# Patient Record
Sex: Female | Born: 1940
Health system: Southern US, Community
[De-identification: ages and names within clinical notes are randomized; demographics above are authoritative.]

## PROBLEM LIST (undated history)

## (undated) DIAGNOSIS — C189 Malignant neoplasm of colon, unspecified: Secondary | ICD-10-CM

## (undated) DIAGNOSIS — R918 Other nonspecific abnormal finding of lung field: Principal | ICD-10-CM

## (undated) DIAGNOSIS — K219 Gastro-esophageal reflux disease without esophagitis: Secondary | ICD-10-CM

## (undated) DIAGNOSIS — R011 Cardiac murmur, unspecified: Secondary | ICD-10-CM

## (undated) DIAGNOSIS — G709 Myoneural disorder, unspecified: Secondary | ICD-10-CM

## (undated) DIAGNOSIS — K5790 Diverticulosis of intestine, part unspecified, without perforation or abscess without bleeding: Secondary | ICD-10-CM

## (undated) DIAGNOSIS — M3500 Sicca syndrome, unspecified: Secondary | ICD-10-CM

## (undated) DIAGNOSIS — C8591 Non-Hodgkin lymphoma, unspecified, lymph nodes of head, face, and neck: Secondary | ICD-10-CM

## (undated) DIAGNOSIS — N200 Calculus of kidney: Secondary | ICD-10-CM

## (undated) DIAGNOSIS — M797 Fibromyalgia: Secondary | ICD-10-CM

## (undated) DIAGNOSIS — F32A Depression, unspecified: Secondary | ICD-10-CM

## (undated) DIAGNOSIS — E785 Hyperlipidemia, unspecified: Secondary | ICD-10-CM

## (undated) DIAGNOSIS — C801 Malignant (primary) neoplasm, unspecified: Secondary | ICD-10-CM

## (undated) DIAGNOSIS — D329 Benign neoplasm of meninges, unspecified: Secondary | ICD-10-CM

## (undated) DIAGNOSIS — I10 Essential (primary) hypertension: Secondary | ICD-10-CM

## (undated) DIAGNOSIS — M1990 Unspecified osteoarthritis, unspecified site: Secondary | ICD-10-CM

## (undated) DIAGNOSIS — T4145XA Adverse effect of unspecified anesthetic, initial encounter: Secondary | ICD-10-CM

## (undated) DIAGNOSIS — H269 Unspecified cataract: Secondary | ICD-10-CM

## (undated) DIAGNOSIS — F329 Major depressive disorder, single episode, unspecified: Secondary | ICD-10-CM

## (undated) DIAGNOSIS — J45909 Unspecified asthma, uncomplicated: Secondary | ICD-10-CM

## (undated) DIAGNOSIS — T8859XA Other complications of anesthesia, initial encounter: Secondary | ICD-10-CM

## (undated) DIAGNOSIS — F419 Anxiety disorder, unspecified: Secondary | ICD-10-CM

## (undated) HISTORY — DX: Sjogren syndrome, unspecified: M35.00

## (undated) HISTORY — DX: Hyperlipidemia, unspecified: E78.5

## (undated) HISTORY — DX: Anxiety disorder, unspecified: F41.9

## (undated) HISTORY — DX: Fibromyalgia: M79.7

## (undated) HISTORY — DX: Benign neoplasm of meninges, unspecified: D32.9

## (undated) HISTORY — DX: Diverticulosis of intestine, part unspecified, without perforation or abscess without bleeding: K57.90

## (undated) HISTORY — DX: Calculus of kidney: N20.0

## (undated) HISTORY — DX: Other nonspecific abnormal finding of lung field: R91.8

## (undated) HISTORY — DX: Malignant neoplasm of colon, unspecified: C18.9

## (undated) HISTORY — DX: Unspecified asthma, uncomplicated: J45.909

## (undated) HISTORY — DX: Unspecified cataract: H26.9

## (undated) HISTORY — PX: ABDOMINAL HYSTERECTOMY: SHX81

## (undated) HISTORY — DX: Essential (primary) hypertension: I10

## (undated) HISTORY — DX: Gastro-esophageal reflux disease without esophagitis: K21.9

## (undated) HISTORY — DX: Myoneural disorder, unspecified: G70.9

## (undated) HISTORY — DX: Non-Hodgkin lymphoma, unspecified, lymph nodes of head, face, and neck: C85.91

## (undated) HISTORY — DX: Malignant (primary) neoplasm, unspecified: C80.1

## (undated) HISTORY — DX: Depression, unspecified: F32.A

## (undated) HISTORY — DX: Major depressive disorder, single episode, unspecified: F32.9

## (undated) HISTORY — DX: Cardiac murmur, unspecified: R01.1

## (undated) HISTORY — PX: POLYPECTOMY: SHX149

## (undated) HISTORY — PX: TONSILLECTOMY: SUR1361

## (undated) HISTORY — PX: COLONOSCOPY: SHX174

## (undated) HISTORY — DX: Unspecified osteoarthritis, unspecified site: M19.90

---

## 1972-08-20 HISTORY — PX: KIDNEY STONE SURGERY: SHX686

## 1987-08-21 HISTORY — PX: VAGINAL HYSTERECTOMY: SHX2639

## 2005-12-19 ENCOUNTER — Ambulatory Visit (HOSPITAL_COMMUNITY): Admission: RE | Admit: 2005-12-19 | Discharge: 2005-12-19 | Payer: Self-pay | Admitting: Internal Medicine

## 2006-03-06 ENCOUNTER — Encounter: Admission: RE | Admit: 2006-03-06 | Discharge: 2006-03-06 | Payer: Self-pay | Admitting: Orthopedic Surgery

## 2006-04-25 ENCOUNTER — Encounter (INDEPENDENT_AMBULATORY_CARE_PROVIDER_SITE_OTHER): Payer: Self-pay | Admitting: Specialist

## 2006-04-25 ENCOUNTER — Ambulatory Visit (HOSPITAL_COMMUNITY): Admission: RE | Admit: 2006-04-25 | Discharge: 2006-04-25 | Payer: Self-pay | Admitting: Surgery

## 2006-08-20 HISTORY — PX: MASS EXCISION: SHX2000

## 2006-08-20 HISTORY — PX: APPENDECTOMY: SHX54

## 2006-10-30 ENCOUNTER — Ambulatory Visit: Payer: Self-pay | Admitting: Internal Medicine

## 2006-11-07 ENCOUNTER — Encounter: Admission: RE | Admit: 2006-11-07 | Discharge: 2006-11-07 | Payer: Self-pay | Admitting: Internal Medicine

## 2006-11-11 ENCOUNTER — Ambulatory Visit: Payer: Self-pay | Admitting: Internal Medicine

## 2006-12-02 ENCOUNTER — Ambulatory Visit: Payer: Self-pay

## 2006-12-02 ENCOUNTER — Encounter: Payer: Self-pay | Admitting: Internal Medicine

## 2006-12-10 ENCOUNTER — Ambulatory Visit: Payer: Self-pay | Admitting: Gastroenterology

## 2006-12-17 ENCOUNTER — Ambulatory Visit: Payer: Self-pay | Admitting: Internal Medicine

## 2006-12-23 ENCOUNTER — Ambulatory Visit: Payer: Self-pay | Admitting: Gastroenterology

## 2006-12-23 ENCOUNTER — Encounter: Payer: Self-pay | Admitting: Internal Medicine

## 2006-12-23 LAB — HM COLONOSCOPY

## 2007-01-15 ENCOUNTER — Encounter: Admission: RE | Admit: 2007-01-15 | Discharge: 2007-01-15 | Payer: Self-pay | Admitting: Internal Medicine

## 2007-03-24 ENCOUNTER — Ambulatory Visit: Payer: Self-pay | Admitting: Internal Medicine

## 2007-03-24 LAB — CONVERTED CEMR LAB
ALT: 27 units/L (ref 0–35)
AST: 25 units/L (ref 0–37)
Bilirubin, Direct: 0.1 mg/dL (ref 0.0–0.3)
Cholesterol: 172 mg/dL (ref 0–200)
Total Bilirubin: 0.6 mg/dL (ref 0.3–1.2)
Total Protein: 8 g/dL (ref 6.0–8.3)

## 2007-03-31 ENCOUNTER — Encounter: Admission: RE | Admit: 2007-03-31 | Discharge: 2007-03-31 | Payer: Self-pay | Admitting: Neurosurgery

## 2007-04-07 ENCOUNTER — Encounter: Payer: Self-pay | Admitting: Internal Medicine

## 2007-05-21 ENCOUNTER — Ambulatory Visit: Payer: Self-pay | Admitting: Internal Medicine

## 2007-06-13 ENCOUNTER — Telehealth: Payer: Self-pay | Admitting: Internal Medicine

## 2007-06-23 ENCOUNTER — Ambulatory Visit: Payer: Self-pay | Admitting: Internal Medicine

## 2007-07-21 DIAGNOSIS — M3500 Sicca syndrome, unspecified: Secondary | ICD-10-CM | POA: Insufficient documentation

## 2007-07-21 DIAGNOSIS — E782 Mixed hyperlipidemia: Secondary | ICD-10-CM | POA: Insufficient documentation

## 2007-07-21 DIAGNOSIS — E785 Hyperlipidemia, unspecified: Secondary | ICD-10-CM | POA: Insufficient documentation

## 2007-07-22 ENCOUNTER — Ambulatory Visit: Payer: Self-pay | Admitting: Internal Medicine

## 2007-07-22 DIAGNOSIS — M797 Fibromyalgia: Secondary | ICD-10-CM | POA: Insufficient documentation

## 2007-07-22 DIAGNOSIS — K5730 Diverticulosis of large intestine without perforation or abscess without bleeding: Secondary | ICD-10-CM | POA: Insufficient documentation

## 2007-07-22 DIAGNOSIS — M76899 Other specified enthesopathies of unspecified lower limb, excluding foot: Secondary | ICD-10-CM | POA: Insufficient documentation

## 2007-07-22 DIAGNOSIS — D320 Benign neoplasm of cerebral meninges: Secondary | ICD-10-CM | POA: Insufficient documentation

## 2007-07-22 DIAGNOSIS — IMO0001 Reserved for inherently not codable concepts without codable children: Secondary | ICD-10-CM | POA: Insufficient documentation

## 2007-07-26 LAB — CONVERTED CEMR LAB
Sed Rate: 68 mm/hr — ABNORMAL HIGH (ref 0–25)
Total CK: 78 units/L (ref 7–177)

## 2007-07-29 ENCOUNTER — Encounter (INDEPENDENT_AMBULATORY_CARE_PROVIDER_SITE_OTHER): Payer: Self-pay | Admitting: *Deleted

## 2007-08-07 ENCOUNTER — Encounter: Payer: Self-pay | Admitting: Internal Medicine

## 2007-08-08 ENCOUNTER — Encounter: Payer: Self-pay | Admitting: Internal Medicine

## 2007-09-02 ENCOUNTER — Ambulatory Visit: Payer: Self-pay | Admitting: Internal Medicine

## 2007-09-02 DIAGNOSIS — L259 Unspecified contact dermatitis, unspecified cause: Secondary | ICD-10-CM | POA: Insufficient documentation

## 2007-09-02 DIAGNOSIS — M654 Radial styloid tenosynovitis [de Quervain]: Secondary | ICD-10-CM | POA: Insufficient documentation

## 2007-09-02 LAB — CONVERTED CEMR LAB

## 2007-09-16 ENCOUNTER — Telehealth: Payer: Self-pay | Admitting: Internal Medicine

## 2007-09-19 ENCOUNTER — Encounter: Payer: Self-pay | Admitting: Internal Medicine

## 2007-10-08 ENCOUNTER — Encounter: Payer: Self-pay | Admitting: Internal Medicine

## 2007-10-30 ENCOUNTER — Telehealth: Payer: Self-pay | Admitting: Internal Medicine

## 2007-11-29 ENCOUNTER — Encounter: Admission: RE | Admit: 2007-11-29 | Discharge: 2007-11-29 | Payer: Self-pay | Admitting: Neurosurgery

## 2007-12-08 ENCOUNTER — Encounter: Payer: Self-pay | Admitting: Internal Medicine

## 2007-12-18 ENCOUNTER — Encounter: Payer: Self-pay | Admitting: Internal Medicine

## 2007-12-18 ENCOUNTER — Telehealth: Payer: Self-pay | Admitting: Internal Medicine

## 2007-12-19 ENCOUNTER — Encounter: Admission: RE | Admit: 2007-12-19 | Discharge: 2007-12-19 | Payer: Self-pay | Admitting: Internal Medicine

## 2008-01-13 ENCOUNTER — Telehealth: Payer: Self-pay | Admitting: Internal Medicine

## 2008-01-16 ENCOUNTER — Telehealth: Payer: Self-pay | Admitting: Internal Medicine

## 2008-02-12 ENCOUNTER — Telehealth: Payer: Self-pay | Admitting: Internal Medicine

## 2008-04-08 ENCOUNTER — Telehealth: Payer: Self-pay | Admitting: Internal Medicine

## 2008-08-10 ENCOUNTER — Encounter: Payer: Self-pay | Admitting: Internal Medicine

## 2008-09-01 ENCOUNTER — Ambulatory Visit: Payer: Self-pay | Admitting: Internal Medicine

## 2008-09-01 LAB — CONVERTED CEMR LAB
AST: 32 units/L (ref 0–37)
BUN: 12 mg/dL (ref 6–23)
CRP, High Sensitivity: 4 (ref 0.00–5.00)
Chloride: 104 meq/L (ref 96–112)
Creatinine, Ser: 0.8 mg/dL (ref 0.4–1.2)
Direct LDL: 130.2 mg/dL
GFR calc Af Amer: 92 mL/min
HDL: 42.3 mg/dL (ref >39.0)
TSH: 1.73 microintl units/mL (ref 0.35–5.50)
VLDL: 37 mg/dL (ref 0–40)

## 2008-11-24 ENCOUNTER — Encounter: Admission: RE | Admit: 2008-11-24 | Discharge: 2008-11-24 | Payer: Self-pay | Admitting: Neurosurgery

## 2008-12-01 ENCOUNTER — Encounter: Payer: Self-pay | Admitting: Internal Medicine

## 2008-12-21 ENCOUNTER — Encounter: Admission: RE | Admit: 2008-12-21 | Discharge: 2008-12-21 | Payer: Self-pay | Admitting: Internal Medicine

## 2008-12-28 ENCOUNTER — Telehealth: Payer: Self-pay | Admitting: Internal Medicine

## 2009-01-13 ENCOUNTER — Ambulatory Visit: Payer: Self-pay | Admitting: Gastroenterology

## 2009-01-28 ENCOUNTER — Ambulatory Visit: Payer: Self-pay | Admitting: Gastroenterology

## 2009-02-28 ENCOUNTER — Ambulatory Visit: Payer: Self-pay | Admitting: Gastroenterology

## 2009-03-15 ENCOUNTER — Ambulatory Visit: Payer: Self-pay | Admitting: Internal Medicine

## 2009-03-16 ENCOUNTER — Encounter (INDEPENDENT_AMBULATORY_CARE_PROVIDER_SITE_OTHER): Payer: Self-pay | Admitting: *Deleted

## 2009-03-16 LAB — CONVERTED CEMR LAB
AST: 30 units/L (ref 0–37)
Direct LDL: 109.4 mg/dL
Triglycerides: 237 mg/dL — ABNORMAL HIGH (ref 0.0–149.0)

## 2009-03-23 ENCOUNTER — Telehealth (INDEPENDENT_AMBULATORY_CARE_PROVIDER_SITE_OTHER): Payer: Self-pay | Admitting: *Deleted

## 2009-07-03 ENCOUNTER — Emergency Department (HOSPITAL_COMMUNITY): Admission: EM | Admit: 2009-07-03 | Discharge: 2009-07-03 | Payer: Self-pay | Admitting: Emergency Medicine

## 2009-08-24 ENCOUNTER — Encounter: Payer: Self-pay | Admitting: Internal Medicine

## 2009-09-19 ENCOUNTER — Telehealth: Payer: Self-pay | Admitting: Internal Medicine

## 2009-09-19 ENCOUNTER — Ambulatory Visit: Payer: Self-pay | Admitting: Internal Medicine

## 2009-09-19 DIAGNOSIS — H9209 Otalgia, unspecified ear: Secondary | ICD-10-CM | POA: Insufficient documentation

## 2009-11-18 DIAGNOSIS — D329 Benign neoplasm of meninges, unspecified: Secondary | ICD-10-CM

## 2009-11-18 HISTORY — DX: Benign neoplasm of meninges, unspecified: D32.9

## 2009-11-30 ENCOUNTER — Encounter: Admission: RE | Admit: 2009-11-30 | Discharge: 2009-11-30 | Payer: Self-pay | Admitting: Neurosurgery

## 2009-12-14 ENCOUNTER — Encounter: Payer: Self-pay | Admitting: Internal Medicine

## 2009-12-26 ENCOUNTER — Ambulatory Visit: Payer: Self-pay | Admitting: Internal Medicine

## 2009-12-26 DIAGNOSIS — I10 Essential (primary) hypertension: Secondary | ICD-10-CM | POA: Insufficient documentation

## 2009-12-26 DIAGNOSIS — B372 Candidiasis of skin and nail: Secondary | ICD-10-CM | POA: Insufficient documentation

## 2009-12-26 DIAGNOSIS — K219 Gastro-esophageal reflux disease without esophagitis: Secondary | ICD-10-CM | POA: Insufficient documentation

## 2009-12-28 ENCOUNTER — Encounter: Admission: RE | Admit: 2009-12-28 | Discharge: 2009-12-28 | Payer: Self-pay | Admitting: Internal Medicine

## 2009-12-28 LAB — HM MAMMOGRAPHY: HM Mammogram: NEGATIVE

## 2010-01-04 ENCOUNTER — Telehealth: Payer: Self-pay | Admitting: Internal Medicine

## 2010-02-02 ENCOUNTER — Ambulatory Visit: Payer: Self-pay | Admitting: Internal Medicine

## 2010-02-02 DIAGNOSIS — R079 Chest pain, unspecified: Secondary | ICD-10-CM

## 2010-02-02 DIAGNOSIS — I209 Angina pectoris, unspecified: Secondary | ICD-10-CM | POA: Insufficient documentation

## 2010-02-02 LAB — CONVERTED CEMR LAB
ALT: 33 units/L (ref 0–35)
AST: 30 units/L (ref 0–37)
Albumin: 4.3 g/dL (ref 3.5–5.2)
BUN: 14 mg/dL (ref 6–23)
Bilirubin, Direct: 0.1 mg/dL (ref 0.0–0.3)
Calcium: 9.5 mg/dL (ref 8.4–10.5)
Cholesterol: 224 mg/dL — ABNORMAL HIGH (ref 0–200)
Potassium: 4.7 meq/L (ref 3.5–5.1)
Sodium: 141 meq/L (ref 135–145)
Total Bilirubin: 0.5 mg/dL (ref 0.3–1.2)
Total CHOL/HDL Ratio: 4
Triglycerides: 223 mg/dL — ABNORMAL HIGH (ref 0.0–149.0)

## 2010-02-10 ENCOUNTER — Encounter: Payer: Self-pay | Admitting: Internal Medicine

## 2010-02-13 ENCOUNTER — Ambulatory Visit: Payer: Self-pay | Admitting: Internal Medicine

## 2010-02-13 ENCOUNTER — Ambulatory Visit: Payer: Self-pay | Admitting: Diagnostic Radiology

## 2010-02-13 ENCOUNTER — Ambulatory Visit (HOSPITAL_BASED_OUTPATIENT_CLINIC_OR_DEPARTMENT_OTHER): Admission: RE | Admit: 2010-02-13 | Discharge: 2010-02-13 | Payer: Self-pay | Admitting: Internal Medicine

## 2010-02-15 ENCOUNTER — Telehealth: Payer: Self-pay | Admitting: Internal Medicine

## 2010-02-17 ENCOUNTER — Telehealth: Payer: Self-pay | Admitting: Internal Medicine

## 2010-03-02 ENCOUNTER — Telehealth: Payer: Self-pay | Admitting: Endocrinology

## 2010-03-09 ENCOUNTER — Ambulatory Visit: Payer: Self-pay | Admitting: Internal Medicine

## 2010-04-05 ENCOUNTER — Telehealth: Payer: Self-pay | Admitting: Internal Medicine

## 2010-06-01 ENCOUNTER — Ambulatory Visit: Payer: Self-pay | Admitting: Internal Medicine

## 2010-06-01 LAB — CONVERTED CEMR LAB
Direct LDL: 162.2 mg/dL
HDL: 54.8 mg/dL (ref >39.00)
Triglycerides: 171 mg/dL — ABNORMAL HIGH (ref 0.0–149.0)
VLDL: 34.2 mg/dL (ref 0.0–40.0)

## 2010-06-02 ENCOUNTER — Encounter: Payer: Self-pay | Admitting: Internal Medicine

## 2010-07-25 ENCOUNTER — Ambulatory Visit: Payer: Self-pay | Admitting: Internal Medicine

## 2010-08-22 ENCOUNTER — Telehealth: Payer: Self-pay | Admitting: Internal Medicine

## 2010-08-23 ENCOUNTER — Encounter: Payer: Self-pay | Admitting: Internal Medicine

## 2010-08-23 ENCOUNTER — Ambulatory Visit
Admission: RE | Admit: 2010-08-23 | Discharge: 2010-08-23 | Payer: Self-pay | Source: Home / Self Care | Attending: Family | Admitting: Family

## 2010-08-23 DIAGNOSIS — J40 Bronchitis, not specified as acute or chronic: Secondary | ICD-10-CM | POA: Insufficient documentation

## 2010-08-23 DIAGNOSIS — J029 Acute pharyngitis, unspecified: Secondary | ICD-10-CM | POA: Insufficient documentation

## 2010-08-23 LAB — CONVERTED CEMR LAB
AST: 30 units/L (ref 0–37)
Albumin: 4.4 g/dL (ref 3.5–5.2)
Basophils Absolute: 0 10*3/uL (ref 0.0–0.1)
Bilirubin, Direct: <0.1 mg/dL (ref 0.0–0.3)
CO2: 25 meq/L (ref 19–32)
Chloride: 101 meq/L (ref 96–112)
Eosinophils Relative: 5 % (ref 0–5)
Glucose, Bld: 98 mg/dL (ref 70–99)
HCT: 39.4 % (ref 36.0–46.0)
HDL: 53 mg/dL (ref >39)
LDL Cholesterol: 113 mg/dL — ABNORMAL HIGH (ref 0–99)
Lymphocytes Relative: 18 % (ref 12–46)
Lymphs Abs: 1.1 10*3/uL (ref 0.7–4.0)
Neutro Abs: 4.2 10*3/uL (ref 1.7–7.7)
Neutrophils Relative %: 65 % (ref 43–77)
Platelets: 275 10*3/uL (ref 150–400)
Potassium: 4.9 meq/L (ref 3.5–5.3)
RDW: 13 % (ref 11.5–15.5)
Sodium: 139 meq/L (ref 135–145)
Total Bilirubin: 0.3 mg/dL (ref 0.3–1.2)
Total CHOL/HDL Ratio: 3.7
VLDL: 32 mg/dL (ref 0–40)
WBC: 6.5 10*3/uL (ref 4.0–10.5)

## 2010-09-07 ENCOUNTER — Ambulatory Visit
Admission: RE | Admit: 2010-09-07 | Discharge: 2010-09-07 | Payer: Self-pay | Source: Home / Self Care | Attending: Internal Medicine | Admitting: Internal Medicine

## 2010-09-07 DIAGNOSIS — M25559 Pain in unspecified hip: Secondary | ICD-10-CM | POA: Insufficient documentation

## 2010-09-10 ENCOUNTER — Encounter: Payer: Self-pay | Admitting: Internal Medicine

## 2010-09-12 ENCOUNTER — Encounter: Payer: Self-pay | Admitting: Internal Medicine

## 2010-09-19 NOTE — Progress Notes (Signed)
Summary: Lab Results  Phone Note Outgoing Call   Summary of Call: call pt - sed rate ( marker of inflammation is 48)  .  lower than prev value.   I do not suspect her chest discomfort related to vaculitis. Initial call taken by: D. Thomos Lemons DO,  February 15, 2010 1:16 PM  Follow-up for Phone Call        patient would like to know if there are treatment options, and what are some of the causes, if there is something that she could do to prevent it from getting any worse Follow-up by: Glendell Docker CMA,  February 15, 2010 3:08 PM  Additional Follow-up for Phone Call Additional follow up Details #1::        answered pt's questions.  she was already given contact info for new rheumatologist. she has been taking omeprazole as needed pt advised to take daily Additional Follow-up by: D. Thomos Lemons DO,  February 16, 2010 4:37 PM

## 2010-09-19 NOTE — Letter (Signed)
Summary: [facility]  [facility]   Imported By: Sherian Rein 08/31/2009 10:57:22  _____________________________________________________________________  External Attachment:    Type:   Image     Comment:   External Document

## 2010-09-19 NOTE — Progress Notes (Signed)
Summary: D/C Simvastatin - rash  Phone Note Call from Patient Call back at Home Phone [phone]   Caller: Patient Summary of Call: Pt called stating that she stopped taking Simvastatin 40 about 3 night ago and rash that she had over entire body is completely gone. Pt is requesting alternate cholesterol medication. Initial call taken by: Meighan Pyle, CMA,  April 05, 2010 9:52 AM  Follow-up for Phone Call        change from simva to pravastatin - erx done Follow-up by: Newt Lukes MD,  April 05, 2010 10:43 AM  Additional Follow-up for Phone Call Additional follow up Details #1::        Pt states that she is still concerned about rash with new Rx so she will start first 2 weeks with 1/2 tab and if no itching or rash she will increase to whole tab. Additional Follow-up by: Wandalene Pyle, CMA,  April 05, 2010 11:12 AM   New Allergies: SIMVASTATIN (SIMVASTATIN) Additional Follow-up for Phone Call Additional follow up Details #2::    ok - noted same- thanks Newt Lukes MD  April 05, 2010 11:52 AM   New/Updated Medications: PRAVASTATIN SODIUM 40 MG TABS (PRAVASTATIN SODIUM) 1 by mouth at bedtime New Allergies: SIMVASTATIN (SIMVASTATIN)Prescriptions: PRAVASTATIN SODIUM 40 MG TABS (PRAVASTATIN SODIUM) 1 by mouth at bedtime  #30 x 3   Entered and Authorized by:   Newt Lukes MD   Signed by:   Newt Lukes MD on 04/05/2010   Method used:   Electronically to        [facility] [address] [phone]* (retail)       [address]       [place], [place]  [address]       Ph: [phone] or [phone]       Fax: [phone]   RxID:   [id number]

## 2010-09-19 NOTE — Letter (Signed)
Summary: [facility]  [facility]   Imported By: Lester Frisco 01/11/2010 10:48:24  _____________________________________________________________________  External Attachment:    Type:   Image     Comment:   External Document

## 2010-09-19 NOTE — Assessment & Plan Note (Signed)
Summary: EAR PAIN/ NWS WILL CALL BACK IF CAN'T MAKE IT AT THAT TIME/NWS   Vital Signs:  Patient profile:   70 year old female Height:      69 inches (175.26 cm) Weight:      206.12 pounds (93.69 kg) BMI:     30.55 O2 Sat:      97 % on Room air Temp:     98.0 degrees F (36.67 degrees C) oral Pulse rate:   98 / minute BP sitting:   148 / 62  (left arm) Cuff size:   large  Vitals Entered By: Orlan Leavens (September 19, 2009 1:38 PM)  O2 Flow:  Room air CC: (R) earache Is Patient Diabetic? No Pain Assessment Patient in pain? yes     Location: (R) ear Type: aching   Primary Care Provider:  Newt Lukes MD  CC:  (R) earache.  History of Present Illness: here today with complaint of  right ear ache. onset of symptoms was 10 days days ago. course has been gradual onset and now occurs in constant  daily pattern. problem precipitated by head cold. symptom characterized as ache and fullness on right side of ear problem not associated with dizziness, hearing loss or drainage. symptoms improved by use of sudafed but stopped taking last week when other sinus symptoms felt better. no prior hx of same symptoms.   noted high BP readings -  attributes to increase caffiene use with coffee - no edema, no CP or SOB - no vision changes ?if needs AAA screen  Current Medications (verified): 1)  Simvastatin 20 Mg  Tabs (Simvastatin) .... Take 1 Tablet By Mouth Once A Day 2)  Fish Oil 1000 Mg  Caps (Omega-3 Fatty Acids) .... Take 1 Tablet By Mouth Once A Day 3)  Glucosamine-Chondroitin 1500-1200 Mg/87ml  Liqd (Glucosamine-Chondroitin) .... Take 1 Tablet By Mouth Once A Day 4)  Triamcinolone Acetonide 0.1 %  Crea (Triamcinolone Acetonide) .... Apply Bid 5)  Vitamin B-12 250 Mcg Tabs (Cyanocobalamin) .... Take 1 By Mouth Qd 6)  Vitamin B-6 250 Mg Tabs (Pyridoxine Hcl) .... Take 1 By Mouth Qd 7)  Folic Acid 1 Mg Tabs (Folic Acid) .... Take 1 By Mouth Qd 8)  Potassium 99 Mg Tabs  (Potassium) .... Take 1 By Mouth Qd 9)  Calcium-Vitamin D 600-200 Mg-Unit Tabs (Calcium-Vitamin D) .... 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    Physical Exam  General:  alert, well-developed, well-nourished, and cooperative to examination.    Ears:  mild clear effusion behind r TM - no cerumen impaction or erythema blaterally Mouth:  teeth and gums in good repair; mucous membranes moist, without lesions or ulcers. oropharynx clear without exudate, no erythema. +clear PND Lungs:  normal respiratory effort, no intercostal retractions or use of accessory muscles; normal breath sounds bilaterally - no crackles and no wheezes.    Heart:  normal rate, regular rhythm, no murmur, and no rub. BLE without edema   Impression & Recommendations:  Problem # 1:  EAR PAIN, RIGHT (ICD-388.70)  min effusions - no signs of infx -= rec cont decongestants for recovering sinus symptoms  tylenol or ibuprofen for mild pain and call if symptoms worse - no need for abx or ENT eval at  this time Discussed symptom control.   Problem # 2:  ELEVATED BLOOD PRESSURE (ICD-796.2) d/w pt suggestion to start antiHTN given upward trend of BP - pt declines and reports she will stop all caffiene before starting any new med - recheck at CPX visit in May - ?need to start med at that time if sill elev - pt to also work on diet and weight loss BP today: 148/62 Prior BP: 130/72 (03/15/2009)  Labs Reviewed: Creat: 0.8 (09/01/2008) Chol: 186 (03/15/2009)   HDL: 41.60 (03/15/2009)   LDL: DEL (09/01/2008)   TG: 237.0 (03/15/2009)  Instructed in low sodium diet (DASH Handout) and behavior modification.    Complete Medication List: 1)   Simvastatin 20 Mg Tabs (Simvastatin) .... Take 1 tablet by mouth once a day 2)  Fish Oil 1000 Mg Caps (Omega-3 fatty acids) .... Take 1 tablet by mouth once a day 3)  Glucosamine-chondroitin 1500-1200 Mg/74ml Liqd (Glucosamine-chondroitin) .... Take 1 tablet by mouth once a day 4)  Triamcinolone Acetonide 0.1 % Crea (Triamcinolone acetonide) .... Apply bid 5)  Vitamin B-12 250 Mcg Tabs (Cyanocobalamin) .... Take 1 by mouth qd 6)  Vitamin B-6 250 Mg Tabs (Pyridoxine hcl) .... Take 1 by mouth qd 7)  Folic Acid 1 Mg Tabs (Folic acid) .... Take 1 by mouth qd 8)  Potassium 99 Mg Tabs (Potassium) .... Take 1 by mouth qd 9)  Calcium-vitamin D 600-200 Mg-unit Tabs (Calcium-vitamin d) .... Take 2 by mouth qd  Patient Instructions: 1)  it was good to see you today.  2)  continue to use decongestants like sudafed or actifed for sinus and ear congestion - sleep with right ear up 3)  if you develop worseing symptoms or fever, call us and we can recosider antibiotics but it does not appear necessary to use any anitbiotic at this time  4)  also ok to use nasal spray like Afrin as needed or nasal saline 5)  tylenol or ibuprofen ok to use for mild pain as needed - take as per bottle directions 6)  cut out all caffiene and watch your blood pressure - 7)  it is important that you work on losing weight - monitor your diet and consume fewer calories such as less carbohydrates (sugar) and less fat. you also need to increase your physical activity level - start by walking for 10-20 minutes 3 times per week and work up to 30 minutes 4-5 times each week.  8)  if blood pressure still up at next visit, will need to consider medications for blood pressure 9)  see you in the spring! call sooner if problems

## 2010-09-19 NOTE — Progress Notes (Signed)
Summary: Shingles  Phone Note Call from Patient   Summary of Call: Patient is requesting a call, wants to know if she recieved the shingles vaccine Initial call taken by: Lamar Sprinkles,  April 08, 2008 3:56 PM  Follow-up for Phone Call        Pt informed vaccine 06/2007, gave pt hp info Follow-up by: Lamar Sprinkles,  April 08, 2008 5:43 PM

## 2010-09-19 NOTE — Consult Note (Signed)
Summary: [facility]  [facility]   Imported By: Sherian Rein 02/13/2010 10:25:21  _____________________________________________________________________  External Attachment:    Type:   Image     Comment:   External Document

## 2010-09-19 NOTE — Assessment & Plan Note (Signed)
Summary: wants to review 6.16.11 EKG results 2nd opinion/dt   Vital Signs:  Patient profile:   70 year old female Weight:      210 pounds BMI:     31.12 O2 Sat:      96 % on Room air Temp:     98.2 degrees F oral Pulse rate:   87 / minute Pulse rhythm:   regular Resp:     18 per minute BP sitting:   126 / 70  (right arm) Cuff size:   large  Vitals Entered By: Glendell Docker CMA (February 13, 2010 9:19 AM)  O2 Flow:  Room air  Contraindications/Deferment of Procedures/Staging:    Test/Procedure: PAP Smear    Reason for deferment: hysterectomy  CC: Rm 3- Consult  Is Patient Diabetic? No Comments 3 months constant left deep upper chest pain, informed EKG was normal, and she is also c/o left hip pain that has worsened over the past 2 week, worse with standing causing difficulty standing     Last PAP Result Declined-Hsyt   Primary Care Provider:  Newt Lukes MD  CC:  Rm 3- Consult .  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   Family history of colon cancer.   No coronary artery disease or type 2 diabetes.     Social History: Occupation:  Pharmacist, hospital, otherwise retired Divorced, lives alone Never Smoked Alcohol use-no    Review of Systems    no fevers she went on estrogen for hot flashes  Physical Exam  General:  alert, well-developed, and well-nourished.   Neck:  supple, no masses, and no carotid bruits.   Chest Wall:  no tenderness and no mass.   Lungs:  normal respiratory effort and normal breath sounds.   Heart:  normal rate, regular rhythm, and no gallop.   Abdomen:  soft, non-tender, and no masses.     Impression & Recommendations:  Problem # 1:  CHEST PAIN UNSPECIFIED (ICD-786.50) 70 y/o with intermittent atypical chest pain.  I agree symptoms likely related to GERD.  Pt advised to take PPI on regular basis instead of as needed.  Patient advised to call office if symptoms persist or worsen.  Orders: T-2 View CXR, Same Day (71020.5TC) T-Sed Rate (Automated) ([id number])  Complete Medication List: 1)  Simvastatin 40 Mg Tabs (Simvastatin) .Marland Kitchen.. 1 by mouth once daily 2)  Fish Oil 1000 Mg Caps (Omega-3 fatty acids) .... Take 1 tablet by mouth once a day 3)  Glucosamine-chondroitin 1500-1200 Mg/66ml Liqd (Glucosamine-chondroitin) .... Take 1 tablet by mouth two times a day 4)  Vitamin B-12 250 Mcg Tabs (Cyanocobalamin) .... Take 1 by mouth qd 5)  Folic Acid 1 Mg Tabs (Folic acid) .... Take 1 by mouth qd 6)  Potassium 99 Mg Tabs (Potassium) .Marland KitchenMarland KitchenMarland Kitchen  Take 1 by mouth qd 7)  Calcium-vitamin D 600-200 Mg-unit Tabs (Calcium-vitamin d) .... Take 2 by mouth qd 8)  Prilosec 20 Mg Cpdr (Omeprazole) .Marland Kitchen.. 1 by mouth once daily 9)  Losartan Potassium 50 Mg Tabs (Losartan potassium) .Marland Kitchen.. 1 by mouth once daily  Patient Instructions: 1)  Continue taking omeprazole daily 2)  Your chest x ray was normal. 3)  Call our office if your chest pain returns or gets worse. 4)  Follow up with Dr. Felicity Coyer as directed   Preventive Care Screening  Pap Smear:    Date:  02/13/2010    Results:  Declined-Hsyt

## 2010-09-19 NOTE — Progress Notes (Signed)
Summary: Alt med/VAL pt  Phone Note Call from Patient Call back at Home Phone [phone]   Caller: Patient Summary of Call: Pt called stating that she has been experiencing extreme fatigue since starting on BP meds. Fatigue has increased in the last 2 weeks. Pt is requesting an alternate BP medication, please advise. Initial call taken by: Yakelin Pyle, CMA,  March 02, 2010 3:23 PM  Follow-up for Phone Call        we could change to a different one, but would you like to decrease current med by 1/2? Follow-up by: Minus Breeding MD,  March 02, 2010 4:03 PM  Additional Follow-up for Phone Call Additional follow up Details #1::        Pt has agreed to take 1/2 tab at bedtime for 7 days and monitor BP. Pt will call back 07/22 with update Additional Follow-up by: Melia Pyle, CMA,  March 02, 2010 4:08 PM

## 2010-09-19 NOTE — Progress Notes (Signed)
Summary: Ear Ache  Phone Note Call from Patient Call back at Home Phone [phone]   Caller: Patient Summary of Call: Pt left message on triage that she has had a low grade ear ache x1 week. Pt would like to know what you recommend, whethere she should just pick something up or if you would want to refer to an ENT MD. Please advise. Initial call taken by: Ashlynne Pyle, CMA,  September 19, 2009 9:44 AM  Follow-up for Phone Call        would rec OV here 1st to look for wax problems or infection causing her pain symptoms -- then may refer to ENT if needed - OTC decongestants (like Actifed) can sometimes help if the symptoms are related to allergies or sinuses but again, i can't be certain without personally evaluating the problem -- thanks Follow-up by: Newt Lukes MD,  September 19, 2009 10:33 AM  Additional Follow-up for Phone Call Additional follow up Details #1::        pt informed, 1st to see MD which she decided on. pt transferred to make appt Additional Follow-up by: Annisten Pyle, CMA,  September 19, 2009 11:06 AM

## 2010-09-19 NOTE — Progress Notes (Signed)
Summary: Rx refill req  Phone Note Call from Patient      New/Updated Medications: SIMVASTATIN 40 MG TABS (SIMVASTATIN) 1 by mouth once daily Prescriptions: SIMVASTATIN 40 MG TABS (SIMVASTATIN) 1 by mouth once daily  #30 x 5   Entered by:   Tephanie Pyle, CMA   Authorized by:   Newt Lukes MD   Signed by:   Noreene Pyle, CMA on 02/17/2010   Method used:   Electronically to        [facility] [address] [phone]* (retail)       [address]       [place], [place]  [address]       Ph: [phone] or [phone]       Fax: [phone]   RxID:   [id number]

## 2010-09-19 NOTE — Assessment & Plan Note (Signed)
Summary: ?side effects bp per dahlia/cd   Vital Signs:  Patient profile:   70 year old female Height:      69 inches (175.26 cm) Weight:      210 pounds (95.45 kg) O2 Sat:      94 % on Room air Temp:     98.0 degrees F (36.67 degrees C) oral Pulse rate:   82 / minute BP sitting:   130 / 62  (left arm) Cuff size:   regular  Vitals Entered By: Orlan Leavens (March 09, 2010 10:04 AM)  O2 Flow:  Room air CC: ? side effect to BP med Is Patient Diabetic? No Pain Assessment Patient in pain? no      Comments Pt states she did not take BP med this am   Primary Care Provider:  Newt Lukes MD  CC:  ? side effect to BP med.  History of Present Illness:  HTN - attributes to increase caffiene use with coffee - no edema, or SOB - no vision changes prev declined bp meds 08/2009 ov - but began ARB 12/26/09 OV- feels med causes sedation - even at 1/2 or 1/4 dose so took non past 2 days - feels great! ?need to take anything for BP.   GERD - improved with prilosec otc -  but symptoms recur as soon as done with meds - no symptoms of burning in throat or chest at this time - ?need to take daily?  dyslipidemia - reports compliance with ongoing medical treatment and no changes in medication dose or frequency. denies adverse side effects related to current therapy.  prev intol of lipitor, thus far, ok on higher dose simva (inc 01/2010)   also reviewed prior issues: sjogren's - feels she is having "dryness flare" and progressive symptoms - last seen by rheum 3+ yrs ago - never rec any tx for same - referral made but pt not yet seen as she rescheduled appt - symptoms include dry mouth, dry eye, dry itchy skin and nail/hair changes  no weight changes or fever  c/o "bursitis" pain- initally in right hip - not improved by steroid injection 2 yr ago - pain occurs when lying on affected side - has now begun to hurt on right hip when lying on right side or back ?PT   Current Medications  (verified): 1)  Simvastatin 40 Mg Tabs (Simvastatin) .Marland Kitchen.. 1 By Mouth Once Daily 2)  Fish Oil 1000 Mg  Caps (Omega-3 Fatty Acids) .... Take 1 Tablet By Mouth Once A Day 3)  Glucosamine-Chondroitin 1500-1200 Mg/69ml  Liqd (Glucosamine-Chondroitin) .... Take 1 Tablet By Mouth Two Times A Day 4)  Vitamin B-12 250 Mcg Tabs (Cyanocobalamin) .... Take 1 By Mouth Qd 5)  Folic Acid 1 Mg Tabs (Folic Acid) .... Take 1 By Mouth Qd 6)  Potassium 99 Mg Tabs (Potassium) .... Take 1 By Mouth Qd 7)  Calcium-Vitamin D 600-200 Mg-Unit Tabs (Calcium-Vitamin D) .... Take 2 By Mouth Qd 8)  Prilosec 20 Mg Cpdr (Omeprazole) .Marland Kitchen.. 1 By Mouth Once Daily 9)  Losartan Potassium 50 Mg Tabs (Losartan Potassium) .Marland Kitchen.. 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    Physical Exam  General:  alert, well-developed, and well-nourished.   Lungs:  normal respiratory effort, no intercostal retractions or use of accessory muscles; normal breath sounds bilaterally - no crackles and no wheezes.    Heart:  normal rate, regular rhythm, no murmur, and no rub. BLE without edema.   Impression & Recommendations:  Problem # 1:  HYPERTENSION (ICD-401.9)  intol of losartan - causes fatigue borderline HTN today on no tx x 2 days - will stop all meds at this time and monitor - reconsider future med tx on as needed basis- rec weight loss and inc physical activity + salt resriction to help contol BP "naturally"- The following medications were removed from the medication list:    Losartan Potassium 50 Mg Tabs (Losartan  potassium) .Marland Kitchen... 1 by mouth once daily  BP today: 130/62 Prior BP: 126/70 (02/13/2010)  Labs Reviewed: K+: 4.7 (02/02/2010) Creat: : 0.7 (02/02/2010)   Chol: 224 (02/02/2010)   HDL: 54.80 (02/02/2010)   LDL: DEL (09/01/2008)   TG: 223.0 (02/02/2010)  Problem # 2:  HYPERLIPIDEMIA (ICD-272.4)  dose inc simva 01/2010 - tol ok thus far - recheck FLP and LFTs next OV - Her updated medication list for this problem includes:    Simvastatin 40 Mg Tabs (Simvastatin) .Marland Kitchen... 1 by mouth once daily  Labs Reviewed: SGOT: 30 (02/02/2010)   SGPT: 33 (02/02/2010)   HDL:54.80 (02/02/2010), 41.60 (03/15/2009)  LDL:DEL (09/01/2008), 100 (03/24/2007)  Chol:224 (02/02/2010), 186 (03/15/2009)  Trig:223.0 (02/02/2010), 237.0 (03/15/2009)  Problem # 3:  GERD (ICD-530.81)  Her updated medication list for this problem includes:    Prilosec 20 Mg Cpdr (Omeprazole) .Marland Kitchen... 1 by mouth once daily as needed for indigestion  Complete Medication List: 1)  Simvastatin 40 Mg Tabs (Simvastatin) .Marland Kitchen.. 1 by mouth once daily 2)  Fish Oil 1000 Mg Caps (Omega-3 fatty acids) .... Take 1 tablet by mouth once a day 3)  Glucosamine-chondroitin 1500-1200 Mg/90ml Liqd (Glucosamine-chondroitin) .... Take 1 tablet by mouth two times a day 4)  Vitamin B-12 250 Mcg Tabs (Cyanocobalamin) .... Take 1 by mouth qd 5)  Folic Acid 1 Mg Tabs (Folic acid) .... Take 1 by mouth qd 6)  Potassium 99 Mg Tabs (Potassium) .... Take 1 by mouth qd 7)  Calcium-vitamin D 600-200 Mg-unit Tabs (Calcium-vitamin d) .... Take 2 by mouth qd 8)  Prilosec 20 Mg Cpdr (Omeprazole) .Marland Kitchen.. 1 by mouth once daily as needed for indigestion  Patient Instructions: 1)  it was good to see you today. 2)  stop losartan - no medications for blood pressure at this time- 3)  Check your Blood Pressure regularly. If it is above: 140/90, you should make an appointment. 4)  continue the omperazole as needed for indigestion - no need to take every day unless having burning pain 5)   Please schedule a follow-up appointment in 3 months to check blood pressure and cholesterol, call sooner if problems.

## 2010-09-19 NOTE — Assessment & Plan Note (Signed)
Summary: 3 MTH FU---STC   Vital Signs:  Patient profile:   70 year old female Height:      69 inches (175.26 cm) Weight:      208 pounds (94.55 kg) O2 Sat:      94 % on Room air Temp:     97.4 degrees F (36.33 degrees C) oral Pulse rate:   81 / minute BP sitting:   132 / 80  (left arm) Cuff size:   regular  Vitals Entered By: Orlan Leavens RMA (June 01, 2010 8:51 AM)  O2 Flow:  Room air CC: 3 month follow-up Is Patient Diabetic? No Pain Assessment Patient in pain? yes     Location: stomach area Type: stinging Onset of pain  pt states she think she has shingles   Primary Care Provider:  Newt Lukes MD  CC:  3 month follow-up.  History of Present Illness:  c/o ?possible shingles on abd  onset 01/2010 - rash and itching beneath both breasts progressive symptoms - initially only itch, then rash, now rash and pain+itch describes as intense pain 10/10 last night inadequate relief with topical steroids, antifungals or combo tx - nurse freind advised pt may have shingles and to consider valtrex - no fever or weight change, no spreading of rash to other areas of body tried stopping rx meds individually to see if one causing rash - no improvment in rash off meds - note pt HAD zostavax 06/2007  HTN - noncompliant attributes to increase caffiene use with coffee - no edema, or SOB - no vision changes declined bp meds 08/2009, started ARB 12/26/09- felt med caused sedation - now on no meds since 01/2010  GERD - improved with prilosec otc -  but symptoms recur as soon as done with meds - no symptoms of burning in throat or chest at this time - ?need to take daily?  dyslipidemia - reports compliance with ongoing medical treatment; inc simva dose 01/2010 then change to prava 03/2010 due to rash (see phone note) denies adverse side effects related to current therapy.  prev intol of lipitor, thus far prava ok but cont rash  sjogren's - feels she is having "dryness flare" and  progressive symptoms - last seen by rheum 3+ yrs ago - never rec any tx for same - referral made but pt not yet seen as she rescheduled appt - now waiting on another provider - sched 08/2010 symptoms include dry mouth, dry eye, dry itchy skin and nail/hair changes  no weight changes or fever  c/o "bursitis" pain- initally in right hip - not improved by steroid injection 2 yr ago - pain occurs when lying on affected side - has now begun to hurt on right hip when lying on right side or back?PT  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 - no intervention - denies HA - follows with Nsurg q2years  Clinical Review Panels:  Immunizations   Last Tetanus Booster:  Td (04/29/2006)   Last Flu Vaccine:  Fluvax 3+ (06/01/2010)   Last Zoster Vaccine:  Zostavax (06/23/2007)  Lipid Management   Cholesterol:  224 (02/02/2010)   LDL (bad choesterol):  DEL (09/01/2008)   HDL (good cholesterol):  54.80 (02/02/2010)  Complete Metabolic Panel   Glucose:  105 (02/02/2010)   Sodium:  141 (02/02/2010)   Potassium:  4.7 (02/02/2010)   Chloride:  105 (02/02/2010)   CO2:  31 (02/02/2010)   BUN:  14 (02/02/2010)   Creatinine:  0.7 (  02/02/2010)   Albumin:  4.3 (02/02/2010)   Total Protein:  7.6 (02/02/2010)   Calcium:  9.5 (02/02/2010)   Total Bili:  0.5 (02/02/2010)   Alk Phos:  89 (02/02/2010)   SGPT (ALT):  33 (02/02/2010)   SGOT (AST):  30 (02/02/2010)   Current Medications (verified): 1)  Pravastatin Sodium 40 Mg Tabs (Pravastatin Sodium) .Marland Kitchen.. 1 By Mouth At Bedtime 2)  Fish Oil 1000 Mg  Caps (Omega-3 Fatty Acids) .... Take 1 Tablet By Mouth Once A Day 3)  Glucosamine-Chondroitin 1500-1200 Mg/49ml  Liqd (Glucosamine-Chondroitin) .... Take 1 Tablet By Mouth Two Times A Day 4)  Vitamin B-12 250 Mcg Tabs (Cyanocobalamin) .... Take 1 By Mouth Qd 5)  Folic Acid 1 Mg Tabs (Folic Acid) .... Take 1 By Mouth Qd 6)  Potassium 99 Mg Tabs (Potassium) .... Take 1 By Mouth Qd 7)   Calcium-Vitamin D 600-200 Mg-Unit Tabs (Calcium-Vitamin D) .... Take 2 By Mouth Qd 8)  Prilosec 20 Mg Cpdr (Omeprazole) .Marland Kitchen.. 1 By Mouth Once Daily As Needed For Indigestion  Allergies (verified): 1)  Simvastatin (Simvastatin)  Past History:  Past Medical History: Hyperlipidemia Sjogren's  meningioma, right post parietal 16mm  - stable 11/2009 MRI fibromyalgia GERD hypertension hx diverticulosis right thumb [facility] arthritis  MD roster: neurosurg - stern derm - amy [place] rheum - (deveshwar) ortho - nitka hand - sypher  Review of Systems       The patient complains of chest pain and suspicious skin lesions.  The patient denies fever.    Physical Exam  General:  alert, well-developed, and well-nourished.   Lungs:  normal respiratory effort, no intercostal retractions or use of accessory muscles; normal breath sounds bilaterally - no crackles and no wheezes.    Heart:  normal rate, regular rhythm, no murmur, and no rub. BLE without edema. Skin:  redness and nodules beneath both breasts - no vesicles, ++puritis changes evident Psych:  Oriented X3, memory intact for recent and remote, normally interactive, good eye contact,very anxious appearing, not depressed appearing, and not agitated.      Impression & Recommendations:  Problem # 1:  CONTACT DERMATITIS (ICD-692.9)  pt feels this represents shingles but clinically affects bilateral anterior chest beneath breasts - s/p zostavax 06/2007 also rash present since june 2011 or longer - initially heat related "itch" starting spring 2011 will start emperic valtrex x 7 days and gabapentin for nerve pain - erx done rec f/u with derm for bx - ?viral or other unimproved with trial tx steroid and steroid+antifungal to see rheum as she believes this related to sjogrens but pt repeatedly cancelled appt referrals to dr. 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.2)  not seen by rheum >3y - ?any tx available given progressive symptoms  repeatedly canceled rescheduled appt with Dareen Piano as referred await 08/2010 appt with deveshwar for new eval and tx as needed - will fax notes as requested  Problem # 3:  HYPERTENSION (ICD-401.9)  intol of losartan - caused fatigue - no meds since 01/2010 reconsider future med tx on as needed basis- rec weight loss and inc physical activity + salt resriction to help contol BP "naturally"-  BP today: 132/80 Prior BP: 130/62 (03/09/2010) Prior BP: 126/70 (02/13/2010)  Labs Reviewed: K+: 4.7 (02/02/2010) Creat: : 0.7 (02/02/2010)   Chol: 224 (02/02/2010)   HDL: 54.80 (02/02/2010)   LDL: DEL (09/01/2008)   TG:  223.0 (02/02/2010)  Problem # 4:  HYPERLIPIDEMIA (ICD-272.4)  Her updated medication list for this problem includes:    Pravastatin Sodium 40 Mg Tabs (Pravastatin sodium) .Marland Kitchen... 1 by mouth at bedtime  Orders: TLB-Lipid Panel (80061-LIPID)  dose inc simva 01/2010 - then changed to prava 03/2010 due to concern about poss med rx causing rash - tol ok thus far - recheck FLP today  Complete Medication List: 1)  Pravastatin Sodium 40 Mg Tabs (Pravastatin sodium) .Marland Kitchen.. 1 by mouth at bedtime 2)  Fish Oil 1000 Mg Caps (Omega-3 fatty acids) .... Take 1 tablet by mouth once a day 3)  Glucosamine-chondroitin 1500-1200 Mg/62ml Liqd (Glucosamine-chondroitin) .... Take 1 tablet by mouth two times a day 4)  Vitamin B-12 250 Mcg Tabs (Cyanocobalamin) .... Take 1 by mouth qd 5)  Folic Acid 1 Mg Tabs (Folic acid) .... Take 1 by mouth qd 6)  Potassium 99 Mg Tabs (Potassium) .... Take 1 by mouth qd 7)  Calcium-vitamin D 600-200 Mg-unit Tabs (Calcium-vitamin d) .... Take 2 by mouth qd 8)  Prilosec 20 Mg Cpdr (Omeprazole) .Marland Kitchen.. 1 by mouth once daily as needed for indigestion 9)  Valtrex 1 Gm Tabs (Valacyclovir hcl) .Marland Kitchen.. 1 by mouth three times a day  x 7d 10)  Gabapentin 300 Mg Caps (Gabapentin) .Marland Kitchen.. 1 by mouth two times a day for nerve pain  Other Orders: Flu Vaccine 74yrs + MEDICARE PATIENTS (G9562) Administration Flu vaccine - MCR (Z3086)  Patient Instructions: 1)  it was good to see you today. 2)  start valtrex and gabapentin for your rash and pain - your prescriptions have been electronically submitted to your pharmacy. Please take as directed. Contact our office if you believe you're having problems with the medication(s).  3)  i am not sure this is shingles but we will ask dermatology to perform biospy so we can better understand the cause of your rash - referral to Dr. [place] requested ASAP. Our office will contact you regarding this appointment once made.  4)  will fax copy of our noted to Dr. Corliss Skains as you requested - 5)  flu shot done today 6)  Please schedule a follow-up appointment as needed, recommend every 3 months for your blood pressure check, sooner if problems. Prescriptions: GABAPENTIN 300 MG CAPS (GABAPENTIN) 1 by mouth two times a day for nerve pain  #60 x 1   Entered and Authorized by:   Newt Lukes MD   Signed by:   Newt Lukes MD on 06/01/2010   Method used:   Electronically to        [facility] [address] [phone]* (retail)       [address]       [place], [place]  [address]       Ph: [phone] or [phone]       Fax: [phone]   RxID:   [id number] VALTREX 1 GM TABS (VALACYCLOVIR HCL) 1 by mouth three times a day x 7d  #21 x 0   Entered and Authorized by:   Newt Lukes MD   Signed by:   Newt Lukes MD on 06/01/2010   Method used:   Electronically to        [facility] [address] [phone]* (retail)       [address]       [place], [place]  [address]  Ph: [phone] or [phone]       Fax: [phone]   RxID:   [id number]  Flu Vaccine Consent Questions     Do you have a history of severe allergic reactions to  this vaccine? no    Any prior history of allergic reactions to egg and/or gelatin? no    Do you have a sensitivity to the preservative Thimersol? no    Do you have a past history of Guillan-Barre Syndrome? no    Do you currently have an acute febrile illness? no    Have you ever had a severe reaction to latex? no    Vaccine information given and explained to patient? yes    Are you currently pregnant? no    Lot Number:[id number]   Exp Date:02/17/2011   Site Given  Left Deltoid IM       Ph: [phone] or [phone]       Fax: [phone]   RxID:   [id number]  .[email or web address]

## 2010-09-19 NOTE — Progress Notes (Signed)
Summary: Rx?  Phone Note Call from Patient Call back at Home Phone [phone]   Caller: Patient Summary of Call: pt called stating that rash under breast has not improved with powder Rx'd by MD. Pt is requesting additional medications, please advise. Initial call taken by: Nevia Pyle, CMA,  Jan 04, 2010 2:54 PM  Follow-up for Phone Call        try cream version of similar med - new e-rx done - if this rash is related to her sjogren's, we may need to see what rheum thinks about it for other ideas on tx... thx! Follow-up by: Newt Lukes MD,  Jan 04, 2010 3:10 PM  Additional Follow-up for Phone Call Additional follow up Details #1::        pt declined cream and will contact Rheumatologist Additional Follow-up by: Juanisha Pyle, CMA,  Jan 04, 2010 3:39 PM    New/Updated Medications: NYSTATIN-TRIAMCINOLONE 100000-0.1 UNIT/GM-% CREA (NYSTATIN-TRIAMCINOLONE) apply to affected skin three times a day as needed Prescriptions: NYSTATIN-TRIAMCINOLONE 100000-0.1 UNIT/GM-% CREA (NYSTATIN-TRIAMCINOLONE) apply to affected skin three times a day as needed  #1 x 1   Entered and Authorized by:   Newt Lukes MD   Signed by:   Newt Lukes MD on 01/04/2010   Method used:   Electronically to        [facility] [address] [phone]* (retail)       [address]       [place], [place]  [address]       Ph: [phone] or [phone]       Fax: [phone]   RxID:   [id number]

## 2010-09-19 NOTE — Assessment & Plan Note (Signed)
Summary: F/U APPT//#/CD   Vital Signs:  Patient profile:   70 year old female Height:      69 inches (175.26 cm) Weight:      211.12 pounds (95.96 kg) O2 Sat:      96 % on Room air Temp:     98.6 degrees F (37.00 degrees C) oral Pulse rate:   91 / minute BP sitting:   146 / 76  (left arm) Cuff size:   regular  Vitals Entered By: Orlan Leavens (Dec 26, 2009 10:36 AM)  O2 Flow:  Room air CC: follow-up visit Is Patient Diabetic? No Pain Assessment Patient in pain? no        Primary Care Provider:  Newt Lukes MD  CC:  follow-up visit.  History of Present Illness: here for 6 mo followup - not fasting today  1) HTN attributes to increase caffiene use with coffee - no edema, no CP or SOB - no vision changes prev declined bp meds 08/2009 ov -  2) sjogren's - feels she is having "dryness flare" and progressive symptoms - last seen by rheum 3+ yrs ago - never rec any tx for same - symptoms include dry mouth, dry eye, dry itchy skin and nail/hair changes  no weight changes or fever  3) GERD - improved with 14d course prilosec otc -  but symptoms recur as soon as done with meds - daily symptoms of burning in throat  4) itch under breasts -  onset >3 mos ago - using steroid cream from derm with only temp and min relief not spreading - no other body area involved - no fever or open skin blisters but hard to wear constrictive clothing  Clinical Review Panels:  Complete Metabolic Panel   Glucose:  100 (09/01/2008)   Sodium:  139 (09/01/2008)   Potassium:  4.2 (09/01/2008)   Chloride:  104 (09/01/2008)   CO2:  27 (09/01/2008)   BUN:  12 (09/01/2008)   Creatinine:  0.8 (09/01/2008)   Albumin:  3.7 (03/24/2007)   Total Protein:  8.0 (03/24/2007)   Calcium:  9.7 (09/01/2008)   Total Bili:  0.6 (03/24/2007)   Alk Phos:  84 (03/24/2007)   SGPT (ALT):  31 (03/15/2009)   SGOT (AST):  30 (03/15/2009)   Current Medications (verified): 1)  Simvastatin 20 Mg  Tabs  (Simvastatin) .... Take 1 Tablet By Mouth Once A Day 2)  Fish Oil 1000 Mg  Caps (Omega-3 Fatty Acids) .... Take 1 Tablet By Mouth Once A Day 3)  Glucosamine-Chondroitin 1500-1200 Mg/7ml  Liqd (Glucosamine-Chondroitin) .... Take 1 Tablet By Mouth Once A Day 4)  Triamcinolone Acetonide 0.1 %  Crea (Triamcinolone Acetonide) .... Apply Bid 5)  Vitamin B-12 250 Mcg Tabs (Cyanocobalamin) .... Take 1 By Mouth Qd 6)  Vitamin B-6 250 Mg Tabs (Pyridoxine Hcl) .... Take 1 By Mouth Qd 7)  Folic Acid 1 Mg Tabs (Folic Acid) .... Take 1 By Mouth Qd 8)  Potassium 99 Mg Tabs (Potassium) .... Take 1 By Mouth Qd 9)  Calcium-Vitamin D 600-200 Mg-Unit Tabs (Calcium-Vitamin D) .... Take 2 By Mouth Qd  Allergies (verified): No Known Drug Allergies  Past History:  Past Medical History: Diverticulosis, colon Hyperlipidemia Sjogren's Right parietal 16-mm meningioma   fibromyalgia GERD hypertension  MD rooster: neurosurg - stern derm - amy [place] rheum -  Review of Systems  The patient denies weight loss, chest pain, syncope, dyspnea on exertion, peripheral edema, and headaches.    Physical Exam  General:  alert, well-developed, well-nourished, and cooperative to examination.    Lungs:  normal respiratory effort, no intercostal retractions or use of accessory muscles; normal breath sounds bilaterally - no crackles and no wheezes.    Heart:  normal rate, regular rhythm, no murmur, and no rub. BLE without edema Skin:  redness c/w candida beath both breasts   Impression & Recommendations:  Problem # 1:  HYPERTENSION (ICD-401.9)  start new med - reck 4-6 weeks with labs next OV - bmet and lipids cont to work on na restriction, diet, weight loss and exercise efforts Her updated medication list for this problem includes:    Losartan Potassium 50 Mg Tabs (Losartan potassium) .Marland Kitchen... 1 by mouth once daily  Orders: Prescription Created Electronically [phone])  BP today: 146/76 Prior BP: 148/62  (09/19/2009)  Labs Reviewed: K+: 4.2 (09/01/2008) Creat: : 0.8 (09/01/2008)   Chol: 186 (03/15/2009)   HDL: 41.60 (03/15/2009)   LDL: DEL (09/01/2008)   TG: 237.0 (03/15/2009)  Problem # 2:  CANDIDIASIS OF SKIN AND NAILS (ICD-112.3) use nystatin powder - pt velieves this skin prob related to sjogrens - see next Orders: Prescription Created Electronically [phone])  Problem # 3:  SJOGREN'S SYNDROME (ICD-710.2) not seen by rheum >3y - ?any tx available given progressive symptoms  refer now for review Orders: Rheumatology Referral (Rheumatology)  Problem # 4:  GERD (ICD-530.81) start daily ppi - new erx done Her updated medication list for this problem includes:    Prilosec 20 Mg Cpdr (Omeprazole) .Marland Kitchen... 1 by mouth once daily  Problem # 5:  HYPERLIPIDEMIA (ICD-272.4)  check flp next ov - cont same until then Her updated medication list for this problem includes:    Simvastatin 20 Mg Tabs (Simvastatin) .Marland Kitchen... Take 1 tablet by mouth once a day  Labs Reviewed: SGOT: 30 (03/15/2009)   SGPT: 31 (03/15/2009)   HDL:41.60 (03/15/2009), 42.3 (09/01/2008)  LDL:DEL (09/01/2008), 100 (03/24/2007)  Chol:186 (03/15/2009), 204 (09/01/2008)  Trig:237.0 (03/15/2009), 183 (09/01/2008)  Complete Medication List: 1)  Simvastatin 20 Mg Tabs (Simvastatin) .... Take 1 tablet by mouth once a day 2)  Fish Oil 1000 Mg Caps (Omega-3 fatty acids) .... Take 1 tablet by mouth once a day 3)  Glucosamine-chondroitin 1500-1200 Mg/66ml Liqd (Glucosamine-chondroitin) .... Take 1 tablet by mouth once a day 4)  Triamcinolone Acetonide 0.1 % Crea (Triamcinolone acetonide) .... Apply bid 5)  Vitamin B-12 250 Mcg Tabs (Cyanocobalamin) .... Take 1 by mouth qd 6)  Vitamin B-6 250 Mg Tabs (Pyridoxine hcl) .... Take 1 by mouth qd 7)  Folic Acid 1 Mg Tabs (Folic acid) .... Take 1 by mouth qd 8)  Potassium 99 Mg Tabs (Potassium) .... Take 1 by mouth qd 9)  Calcium-vitamin D 600-200 Mg-unit Tabs (Calcium-vitamin d) .... Take 2  by mouth qd 10)  Prilosec 20 Mg Cpdr (Omeprazole) .Marland Kitchen.. 1 by mouth once daily 11)  Losartan Potassium 50 Mg Tabs (Losartan potassium) .Marland Kitchen.. 1 by mouth once daily 12)  Nystatin 100000 Unit/gm Powd (Nystatin) .... Apply to itch and affected skin three times a day as needed  Patient Instructions: 1)  it was good to see you today. 2)  use yeast powder (nystatin) for the itch symptoms under breasts as discussed 3)  start daily prilosec for your reflux symptoms  4)  start losartan for your high blood pressure readings - 5)  all 3 of these new prescriptions have been electronically submitted to your pharmacy. Please take as directed. Contact our office if you believe you're  having problems with the medications - 6)  we'll make referral to dr. Dareen Piano with rheumatology review. Our office will contact you regarding this appointment once made.  7)  Please schedule a follow-up appointment in 4-6 weeks to recheck bloos pressure, sooner if problems. come in fasting for labs Prescriptions: NYSTATIN 100000 UNIT/GM POWD (NYSTATIN) apply to itch and affected skin three times a day as needed  #1 x 1   Entered and Authorized by:   Newt Lukes MD   Signed by:   Newt Lukes MD on 12/26/2009   Method used:   Electronically to        [facility] [address] [phone]* (retail)       [address]       [place], [place]  [address]       Ph: [phone] or [phone]       Fax: [phone]   RxID:   [id number] LOSARTAN POTASSIUM 50 MG TABS (LOSARTAN POTASSIUM) 1 by mouth once daily  #30 x 5   Entered and Authorized by:   Newt Lukes MD   Signed by:   Newt Lukes MD on 12/26/2009   Method used:   Electronically to        [facility] [address] [phone]* (retail)       [address]       [place], [place]  [address]       Ph: [phone] or [phone]       Fax: [phone]   RxID:   [id number] PRILOSEC 20 MG CPDR (OMEPRAZOLE) 1 by mouth  once daily  #30 x 5   Entered and Authorized by:   Newt Lukes MD   Signed by:   Newt Lukes MD on 12/26/2009   Method used:   Electronically to        [facility] [address] [phone]* (retail)       [address]       [place], [place]  [address]       Ph: [phone] or [phone]       Fax: [phone]   RxID:   [id number]

## 2010-09-19 NOTE — Assessment & Plan Note (Signed)
Summary: 6 WK F/U // # / CD   Vital Signs:  Patient profile:   70 year old female Height:      69 inches (175.26 cm) Weight:      207.0 pounds (94.09 kg) O2 Sat:      94 % on Room air Temp:     97.5 degrees F (36.39 degrees C) oral Pulse rate:   85 / minute BP sitting:   130 / 68  (left arm) Cuff size:   regular  Vitals Entered By: Orlan Leavens (February 02, 2010 8:40 AM)  O2 Flow:  Room air CC: 6 week f/u Is Patient Diabetic? No Pain Assessment Patient in pain? no        Primary Care Provider:  Newt Lukes MD  CC:  6 week f/u.  History of Present Illness: here for 6 wk followup - fasting today  1) HTN attributes to increase caffiene use with coffee - no edema, or SOB - no vision changes prev declined bp meds 08/2009 ov - but began ARB 12/26/09 OV reports compliance with ongoing medical treatment and no changes in medication dose or frequency. denies adverse side effects related to current therapy.   2) CP - c/o occ L side CP, intermittent nature lasts - all day for 2-3days at a time - not plueritic - not related to activity or food intake no GERD no dizzy or SOB  3) sjogren's - feels she is having "dryness flare" and progressive symptoms - last seen by rheum 3+ yrs ago - never rec any tx for same - referral made but pt not yet seen as she rescheduled appt - symptoms include dry mouth, dry eye, dry itchy skin and nail/hair changes  no weight changes or fever  4) GERD - improved with 14d course prilosec otc -  but symptoms recur as soon as done with meds - daily symptoms of burning in throat  5) itch under breasts -  onset >3 mos ago - using steroid cream from derm with only temp and min relief not spreading - no other body area involved - no fever or open skin blisters but hard to wear constrictive clothing not improved with combo tx from last OV  6) c/o "bursitis" pain- initally in right hip - not improved by steroid injection 2 yr ago - pain  occurs when lying on affected side - has now begun to hurt on right hip when lying on right side or back ?PT  Clinical Review Panels:  Lipid Management   Cholesterol:  186 (03/15/2009)   LDL (bad choesterol):  DEL (09/01/2008)   HDL (good cholesterol):  41.60 (03/15/2009)  Complete Metabolic Panel   Glucose:  100 (09/01/2008)   Sodium:  139 (09/01/2008)   Potassium:  4.2 (09/01/2008)   Chloride:  104 (09/01/2008)   CO2:  27 (09/01/2008)   BUN:  12 (09/01/2008)   Creatinine:  0.8 (09/01/2008)   Albumin:  3.7 (03/24/2007)   Total Protein:  8.0 (03/24/2007)   Calcium:  9.7 (09/01/2008)   Total Bili:  0.6 (03/24/2007)   Alk Phos:  84 (03/24/2007)   SGPT (ALT):  31 (03/15/2009)   SGOT (AST):  30 (03/15/2009)   Current Medications (verified): 1)  Simvastatin 20 Mg  Tabs (Simvastatin) .... Take 1 Tablet By Mouth Once A Day 2)  Fish Oil 1000 Mg  Caps (Omega-3 Fatty Acids) .... Take 1 Tablet By Mouth Once A Day 3)  Glucosamine-Chondroitin 1500-1200 Mg/27ml  Liqd (Glucosamine-Chondroitin) .... Take  1 Tablet By Mouth Two Times A Day 4)  Vitamin B-12 250 Mcg Tabs (Cyanocobalamin) .... Take 1 By Mouth Qd 5)  Folic Acid 1 Mg Tabs (Folic Acid) .... Take 1 By Mouth Qd 6)  Potassium 99 Mg Tabs (Potassium) .... Take 1 By Mouth Qd 7)  Calcium-Vitamin D 600-200 Mg-Unit Tabs (Calcium-Vitamin D) .... Take 2 By Mouth Qd 8)  Prilosec 20 Mg Cpdr (Omeprazole) .Marland Kitchen.. 1 By Mouth Once Daily 9)  Losartan Potassium 50 Mg Tabs (Losartan Potassium) .Marland Kitchen.. 1 By Mouth Once Daily 10)  Nystatin-Triamcinolone 100000-0.1 Unit/gm-% Crea (Nystatin-Triamcinolone) .... 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   Family history of colon cancer.   No coronary artery disease or type 2 diabetes.   Social History: Occupation:  Pharmacist, hospital, otherwise retired Divorced, lives alone Never Smoked Alcohol use-no   Review of Systems  The patient denies syncope, dyspnea on exertion, abdominal pain, difficulty walking, and depression.         also, see HPI above. I have reviewed all other systems and they were negative.   Physical Exam  General:  alert, well-developed, well-nourished, and cooperative to examination.    Lungs:  normal respiratory effort, no intercostal retractions or use of accessory muscles; normal breath sounds bilaterally - no crackles and no wheezes.    Heart:  normal rate, regular rhythm, no murmur, and no rub. BLE without edema Msk:  hip: decreased internal rotation. Pain with log roll.  Groin pain present. Pain with abduction. positive Trendelenburg sign.  Neurologic:  alert & oriented X3 and cranial nerves II-XII symetrically intact.  strength normal in all extremities, sensation intact to light touch, and gait normal. speech fluent without dysarthria or aphasia; follows commands with good comprehension.    Impression & Recommendations:  Problem # 1:  HYPERTENSION (ICD-401.9) Assessment Improved  improved on tx - cont same - check labs, monitor lytes Her updated medication list for this problem includes:    Losartan Potassium 50 Mg Tabs (Losartan potassium) .Marland Kitchen... 1 by mouth once daily  BP today: 130/68 Prior BP: 146/76 (12/26/2009)  Labs Reviewed: K+: 4.2 (09/01/2008) Creat: : 0.8 (09/01/2008)   Chol: 186 (03/15/2009)   HDL: 41.60 (03/15/2009)   LDL: DEL (09/01/2008)   TG: 237.0 (03/15/2009)  Orders: TLB-BMP (Basic Metabolic Panel-BMET) (80048-METABOL)  Problem # 2:  HYPERLIPIDEMIA (ICD-272.4)  Her updated medication list for this problem includes:    Simvastatin 20 Mg Tabs  (Simvastatin) .Marland Kitchen... Take 1 tablet by mouth once a day  check flp and lfts to monitor statin use  Orders: TLB-Lipid Panel (80061-LIPID)  Labs Reviewed: SGOT: 30 (03/15/2009)   SGPT: 31 (03/15/2009)   HDL:41.60 (03/15/2009), 42.3 (09/01/2008)  LDL:DEL (09/01/2008), 100 (03/24/2007)  Chol:186 (03/15/2009), 204 (09/01/2008)  Trig:237.0 (03/15/2009), 183 (09/01/2008)  Problem # 3:  BURSITIS, RIGHT HIP (ICD-726.5)  now also affecting left - refer to PT as not interested in oral antiinflamm or ortho at this time  Orders: Physical Therapy Referral (PT)  Problem # 4:  CHEST PAIN UNSPECIFIED (ICD-786.50) none now - intermittent L side symptoms - EKG NSR w/o ischemic change - reassurance provided -  consider PPI  tx for poss GI source if persisting symptoms after rheum eval Orders: EKG w/ Interpretation (93000)  Problem # 5:  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 Apply bid  Patient continues with maculopapular eruption over anterior chest/sternum that is pruritic. to see rheum as she believes this related to sjogrens  Complete Medication List: 1)  Simvastatin 20 Mg Tabs (Simvastatin) .... Take 1 tablet by mouth once a day 2)  Fish Oil 1000 Mg Caps (Omega-3 fatty acids) .... Take 1 tablet by mouth once a day 3)  Glucosamine-chondroitin 1500-1200 Mg/78ml Liqd (Glucosamine-chondroitin) .... Take 1 tablet by mouth two times a day 4)  Vitamin B-12 250 Mcg Tabs (Cyanocobalamin) .... Take 1 by mouth qd 5)  Folic Acid 1 Mg Tabs (Folic acid) .... Take 1 by mouth qd 6)  Potassium 99 Mg Tabs (Potassium) .... Take 1 by  mouth qd 7)  Calcium-vitamin D 600-200 Mg-unit Tabs (Calcium-vitamin d) .... Take 2 by mouth qd 8)  Prilosec 20 Mg Cpdr (Omeprazole) .Marland Kitchen.. 1 by mouth once daily 9)  Losartan Potassium 50 Mg Tabs (Losartan potassium) .Marland Kitchen.. 1 by mouth once daily  Other Orders: TLB-Hepatic/Liver Function Pnl (80076-HEPATIC)  Patient Instructions: 1)  it was good to see you today. 2)  EKG show that your heart looks fine - 3)  we'll make referral to physical therapy for your hip pain. Our office will contact you regarding this appointment once made.  4)  test(s) ordered today - your results will be posted on the phone tree for review in 48-72 hours from the time of test completion; call [phone] and enter your 9 digit MRN (listed above on this page, just below your name); if any changes need to be made or there are abnormal results, you will be contacted directly.  5)  ok to take valerian root for sleep with your other medications - no interactions known 6)  Please schedule a follow-up appointment in 6 months to monitor blood pressure and cholesterol, sooner if problems.

## 2010-09-19 NOTE — Consult Note (Signed)
Summary: [facility]   Imported By: Sherian Rein 06/22/2010 11:12:04  _____________________________________________________________________  External Attachment:    Type:   Image     Comment:   External Document

## 2010-09-21 NOTE — Miscellaneous (Signed)
Summary: Orders Update  Clinical Lists Changes  Orders: Added new Test order of T-Basic Metabolic Panel [id number]) - Signed Added new Test order of T-Hepatic Function [id number]) - Signed Added new Test order of T-Lipid Profile ([id number]) - Signed Added new Test order of T-CBC w/Diff ([id number]) - Signed

## 2010-09-21 NOTE — Assessment & Plan Note (Signed)
Summary: Rash/dt   Vital Signs:  Patient profile:   70 year old female Height:      69 inches Weight:      21.25 pounds BMI:     3.15 O2 Sat:      99 % on Room air Temp:     98.1 degrees F oral Pulse rate:   96 / minute Resp:     18 per minute BP sitting:   144 / 60  (right arm) Cuff size:   large  Vitals Entered By: Glendell Docker CMA (July 25, 2010 8:41 AM)  O2 Flow:  Room air CC: Follow up Is Patient Diabetic? No Pain Assessment Patient in pain? no        Primary Care Humphrey Guerreiro:  Dondra Spry DO  CC:  Follow up.  History of Present Illness: 70 y/o white female for f/u hx of intertrigo - recently worse than normal skin extremely irritated,  poor sleep pt's friend suggested she may have shingles she was seen by derm - rash attributed to intertrigo.  better with topical meds  htn - tried losartan but had to stop due to dizziness  hyperlipidemia - restarted simvastatin.  no change in rash  Preventive Screening-Counseling & Management  Alcohol-Tobacco     Smoking Status: never  Past History:  Past Medical History: Hyperlipidemia Sjogren's  meningioma, right post parietal 16mm  - stable 11/2009 MRI fibromyalgia GERD hypertension  hx diverticulosis right thumb CMC arthritis  MD roster: neurosurg - stern derm - amy [place] rheum - (deveshwar) ortho - nitka hand - sypher  Past Surgical History: none    Family History: Mother and father are both deceased.   Family history of colon cancer.   No coronary artery disease or type 2 diabetes    Social History: Occupation:  Pharmacist, hospital, otherwise retired Divorced, lives alone Never Smoked Alcohol use-no      Physical Exam  General:  alert, well-developed, and well-nourished.   Neck:  supple, no masses, and no carotid bruits.   Lungs:  normal respiratory effort and normal breath sounds.   Heart:  normal rate, regular rhythm, and no gallop.     Impression & Recommendations:  Problem  # 1:  HYPERLIPIDEMIA (ICD-272.4) pt resumed simvastatin  The following medications were removed from the medication list:    Pravastatin Sodium 40 Mg Tabs (Pravastatin sodium) .Marland Kitchen... 1 by mouth at bedtime Her updated medication list for this problem includes:    Simvastatin 20 Mg Tabs (Simvastatin) ..... One by mouth q pm  Labs Reviewed: SGOT: 30 (02/02/2010)   SGPT: 33 (02/02/2010)   HDL:54.80 (06/01/2010), 54.80 (02/02/2010)  LDL:DEL (09/01/2008), 100 (03/24/2007)  Chol:253 (06/01/2010), 224 (02/02/2010)  Trig:171.0 (06/01/2010), 223.0 (02/02/2010)  Problem # 2:  HYPERTENSION (ICD-401.9) Assessment: Deteriorated she could not tolerate losartan due to dizziness  Her updated medication list for this problem includes:    Hydrochlorothiazide 12.5 Mg Tabs (Hydrochlorothiazide) ..... One by mouth once daily  BP today: 144/60 Prior BP: 132/80 (06/01/2010)  Labs Reviewed: K+: 4.7 (02/02/2010) Creat: : 0.7 (02/02/2010)   Chol: 253 (06/01/2010)   HDL: 54.80 (06/01/2010)   LDL: DEL (09/01/2008)   TG: 171.0 (06/01/2010)  Problem # 3:  CANDIDIASIS OF SKIN AND NAILS (ICD-112.3) Assessment: Improved rash under breasts improved with topical agents prescribed by derm pt advised to try keep area dry  Complete Medication List: 1)  Fish Oil 1000 Mg Caps (Omega-3 fatty acids) .... Take 1 tablet by mouth once a day 2)  Glucosamine-chondroitin 1500-1200 Mg/16ml Liqd (Glucosamine-chondroitin) .... Take 1 tablet by mouth two times a day 3)  Vitamin B-12 250 Mcg Tabs (Cyanocobalamin) .... Take 1 by mouth qd 4)  Folic Acid 1 Mg Tabs (Folic acid) .... Take 1 by mouth qd 5)  Potassium 99 Mg Tabs (Potassium) .... Take 1 by mouth qd 6)  Calcium-vitamin D 600-200 Mg-unit Tabs (Calcium-vitamin d) .... Take 2 by mouth qd 7)  Prilosec 20 Mg Cpdr (Omeprazole) .Marland Kitchen.. 1 by mouth once daily as needed for indigestion 8)  Simvastatin 20 Mg Tabs (Simvastatin) .... One by mouth q pm 9)  Hydrochlorothiazide 12.5 Mg Tabs  (Hydrochlorothiazide) .... One by mouth once daily  Patient Instructions: 1)  Please schedule a follow-up appointment in 6 weeks. 2)  BMP prior to visit, ICD-9:  401.9 3)  Hepatic Panel prior to visit, ICD-9: 272.4 4)  Lipid Panel prior to visit, ICD-9: 272.4 5)  CBC w/ Diff prior to visit, ICD-9: 710.2 6)  Please return for lab work one (1) week before your next appointment.  Prescriptions: HYDROCHLOROTHIAZIDE 12.5 MG TABS (HYDROCHLOROTHIAZIDE) one by mouth once daily  #30 x 2   Entered and Authorized by:   D. Thomos Lemons DO   Signed by:   D. Thomos Lemons DO on 07/25/2010   Method used:   Electronically to        [facility]. [address] [phone]* (retail)       [address]       [place], [place]  [address]       Ph: [phone] or [phone]       Fax: [phone]   RxID:   [id number] SIMVASTATIN 20 MG TABS (SIMVASTATIN) one by mouth q pm  #90 x 1   Entered and Authorized by:   D. Thomos Lemons DO   Signed by:   D. Thomos Lemons DO on 07/25/2010   Method used:   Electronically to        [facility]. [address] [phone]* (retail)       [address]       [place], [place]  [address]       Ph: [phone] or [phone]       Fax: [phone]   RxID:   [id number] PRILOSEC 20 MG CPDR (OMEPRAZOLE) 1 by mouth once daily as needed for indigestion  #90 x 1   Entered and Authorized by:   D. Thomos Lemons DO   Signed by:   D. Thomos Lemons DO on 07/25/2010   Method used:   Electronically to        [facility]. [address] [phone]* (retail)       [address]       [place], [place]  [address]       Ph: [phone] or [phone]       Fax: [phone]   RxID:   [id number]    Orders Added: 1)  Est. Patient Level III [67619]

## 2010-09-21 NOTE — Progress Notes (Signed)
Summary: Sore throat &  cough  Phone Note Call from Patient Call back at Home Phone [phone]   Caller: Patient Call For: D. Thomos Lemons DO Summary of Call: patient called and left voice message on Monday stating she has had spre throat for the past 6 days, with a cough. Her message states that she has used over the counter chloraseptic spray with no relief.  Initial call taken by: Glendell Docker CMA,  August 22, 2010 11:46 AM  Follow-up for Phone Call        call was returned  to patient at [phone],no answer. A detailed voice message was left for patient informing her symptoms require  office visit. Voice message was left for patient to call to schedule office visit for evaluation Follow-up by: Glendell Docker CMA,  August 22, 2010 11:49 AM

## 2010-09-21 NOTE — Assessment & Plan Note (Signed)
Summary: cough and cold/mhf--rm 4   Vital Signs:  Patient profile:   70 year old female Height:      69 inches Weight:      206.75 pounds BMI:     30.64 Temp:     97.9 degrees F oral Pulse rate:   84 / minute Pulse rhythm:   regular Resp:     16 per minute BP sitting:   130 / 74  (right arm) Cuff size:   large  Vitals Entered By: Mervin Kung CMA Duncan Dull) (August 23, 2010 9:22 AM) CC: Pt here for productive cough since Christmas and sore throat. Is Patient Diabetic? No Pain Assessment Patient in pain? no      Comments Pt agrees all med doses and directions are correct. [name] CMA Duncan Dull)  August 23, 2010 9:31 AM    Primary Care Provider:  Dondra Spry DO  CC:  Pt here for productive cough since Christmas and sore throat.Marland Kitchen  History of Present Illness: [name] is a 70 year old female who presents today with chief complaint of cough. Symptoms started few days before Christmas. Cough is productive of green sputum- blood tinged sputum this AM. Cough is associated with sore throat. Notes subjective fever.  Has tried Chloraseptic throat spray x 1 week with some improvement.    Allergies (verified): No Known Drug Allergies  Past History:  Past Medical History: Last updated: 07/25/2010 Hyperlipidemia Sjogren's  meningioma, right post parietal 16mm  - stable 11/2009 MRI fibromyalgia GERD hypertension  hx diverticulosis right thumb C[facility] arthritis  MD roster: neurosurg - stern derm - amy [place] rheum - (deveshwar) ortho - nitka hand - sypher  Past Surgical History: Last updated: 07/25/2010 none    Review of Systems       see HPI  Physical Exam  General:  Well-developed,well-nourished,in no acute distress; alert,appropriate and cooperative throughout examination Head:  Normocephalic and atraumatic without obvious abnormalities. No apparent alopecia or balding. Eyes:  PERRLA, sclera are clear Ears:  External ear exam shows no significant  lesions or deformities.  Otoscopic examination reveals clear canals, tympanic membranes are intact bilaterally without bulging, retraction, inflammation or discharge. Hearing is grossly normal bilaterally. Mouth:  Oral mucosa and oropharynx without lesions or exudates.   Neck:  No deformities, masses, or tenderness noted. Lungs:  Normal respiratory effort, chest expands symmetrically. Lungs are clear to auscultation, no crackles or wheezes. Heart:  Normal rate and regular rhythm. S1 and S2 normal without gallop, murmur, click, rub or other extra sounds. Psych:  Cognition and judgment appear intact. Alert and cooperative with normal attention span and concentration. No apparent delusions, illusions, hallucinations   Impression & Recommendations:  Problem # 1:  BRONCHITIS (ICD-490) Assessment New Will plan to treat for bronchitis with zithromax.  Pt to call if fever, if symptoms worsen, or if no improvement in 48-72 hours. Her updated medication list for this problem includes:    Zithromax Z-pak 250 Mg Tabs (Azithromycin) .Marland Kitchen..Marland Kitchen Two tabs by mouth today, then one tablet by mouth daily for 4 more days  Problem # 2:  PHARYNGITIS (ICD-462) Assessment: New Rapid strep is negative.   Plan supportive measures as outlined in patient instructions. Her updated medication list for this problem includes:    Zithromax Z-pak 250 Mg Tabs (Azithromycin) .Marland Kitchen..Marland Kitchen Two tabs by mouth today, then one tablet by mouth daily for 4 more days  Complete Medication List: 1)  Fish Oil 1000 Mg Caps (Omega-3 fatty acids) .... Take  1 tablet by mouth once a day 2)  Glucosamine-chondroitin 1500-1200 Mg/60ml Liqd (Glucosamine-chondroitin) .... Take 1 tablet by mouth two times a day 3)  Vitamin B-12 250 Mcg Tabs (Cyanocobalamin) .... Take 1 by mouth qd 4)  Folic Acid 1 Mg Tabs (Folic acid) .... Take 1 by mouth qd 5)  Potassium 99 Mg Tabs (Potassium) .... Take 1 by mouth qd 6)  Calcium-vitamin D 600-200 Mg-unit Tabs  (Calcium-vitamin d) .... Take 2 by mouth qd 7)  Prilosec 20 Mg Cpdr (Omeprazole) .Marland Kitchen.. 1 by mouth once daily as needed for indigestion 8)  Simvastatin 20 Mg Tabs (Simvastatin) .... One by mouth q pm 9)  Hydrochlorothiazide 12.5 Mg Tabs (Hydrochlorothiazide) .... One by mouth once daily 10)  Zithromax Z-pak 250 Mg Tabs (Azithromycin) .... Two tabs by mouth today, then one tablet by mouth daily for 4 more days  Other Orders: Rapid Strep (16109)  Patient Instructions: 1)  Gargle twice daily with salt water. 2)  Take Ibuprofen (advil) 400 mg every 6 hours as needed for pain 3)  You may use over the counter Cepacol lozenges or Chloraseptic spray as needed for sore throat. 4)  Call if you develop fever, or if your symptoms are not improved in 48-72 hours. Prescriptions: ZITHROMAX Z-PAK 250 MG TABS (AZITHROMYCIN) two tabs by mouth today, then one tablet by mouth daily for 4 more days  #1 pack x 0   Entered and Authorized by:   Lemont Fillers FNP   Signed by:   Lemont Fillers FNP on 08/23/2010   Method used:   Electronically to        [facility]. [address] [phone]* (retail)       [address]       [place], [place]  [address]       Ph: [phone] or [phone]       Fax: [phone]   RxID:   [id number]    Orders Added: 1)  Rapid Strep [34742] 2)  Est. Patient Level III [59563]    Current Allergies (reviewed today): No known allergies   Laboratory Results   Date/Time Reported: Mervin Kung CMA Duncan Dull)  August 23, 2010 9:43 AM   Other Tests  Rapid Strep: negative  Kit Test Internal QC: Positive   (Normal Range: Negative)

## 2010-09-27 NOTE — Assessment & Plan Note (Signed)
Summary: 6 week follow up/mhf   Vital Signs:  Patient profile:   70 year old female Height:      69 inches Weight:      211.25 pounds BMI:     31.31 O2 Sat:      97 % on Room air Temp:     97.6 degrees F rectal Pulse rate:   81 / minute Resp:     18 per minute BP sitting:   130 / 70  (right arm) Cuff size:   large  Vitals Entered By: Glendell Docker CMA (September 07, 2010 9:07 AM)  O2 Flow:  Room air CC: 6 Week follow up  Is Patient Diabetic? No Pain Assessment Patient in pain? no      Comments upper right thigh discomfort-assoicated with ? bursa pain, onset a few weeks ago- constant,  itching has not resolved   Primary Care Provider:  Dondra Spry DO  CC:  6 Week follow up .  History of Present Illness: poor sleep - only 3-4 hrs per night struggles with fibromyalgia pain she did not notice improvement when she temporarily stopped simvastatin  still struggling with chronic pruritus underneath  both breasts  htn - tolerating hctz well does not monitor bp at home no dizzinesss  Preventive Screening-Counseling & Management  Alcohol-Tobacco     Smoking Status: never  Allergies (verified): No Known Drug Allergies  Past History:  Past Medical History: Hyperlipidemia Sjogren's  meningioma, right post parietal 16mm  - stable 11/2009 MRI fibromyalgia  GERD hypertension  hx diverticulosis right thumb CMC arthritis  MD roster: neurosurg - stern derm - amy [place] rheum - (deveshwar) ortho - nitka hand - sypher  Past Surgical History: none     Family History: Mother and father are both deceased.   Family history of colon cancer.   No coronary artery disease or type 2 diabetes     Physical Exam  General:  alert, well-developed, and well-nourished.   Lungs:  Normal respiratory effort, chest expands symmetrically. Lungs are clear to auscultation, no crackles or wheezes. Heart:  Normal rate and regular rhythm. S1 and S2 normal without gallop, murmur,  click, rub or other extra sounds.   Impression & Recommendations:  Problem # 1:  HYPERTENSION (ICD-401.9) Assessment Improved  Her updated medication list for this problem includes:    Hydrochlorothiazide 12.5 Mg Tabs (Hydrochlorothiazide) ..... One by mouth once daily  BP today: 130/70 Prior BP: 130/74 (08/23/2010)  Labs Reviewed: K+: 4.9 (08/23/2010) Creat: : 0.67 (08/23/2010)   Chol: 198 (08/23/2010)   HDL: 53 (08/23/2010)   LDL: 113 (08/23/2010)   TG: 162 (08/23/2010)  Problem # 2:  HIP PAIN, RIGHT (ICD-719.45) follow up with rheum  Problem # 3:  FIBROMYALGIA (ICD-729.1) pt has rx for gabapentin I suggest pt use 300 mg at bedtime  Complete Medication List: 1)  Fish Oil 1000 Mg Caps (Omega-3 fatty acids) .... Take 1 tablet by mouth once a day 2)  Glucosamine-chondroitin 1500-1200 Mg/49ml Liqd (Glucosamine-chondroitin) .... Take 1 tablet by mouth two times a day 3)  Vitamin B-12 250 Mcg Tabs (Cyanocobalamin) .... Take 1 by mouth qd 4)  Folic Acid 1 Mg Tabs (Folic acid) .... Take 1 by mouth qd 5)  Potassium 99 Mg Tabs (Potassium) .... Take 1 by mouth qd 6)  Calcium-vitamin D 600-200 Mg-unit Tabs (Calcium-vitamin d) .... Take 2 by mouth qd 7)  Prilosec 20 Mg Cpdr (Omeprazole) .Marland Kitchen.. 1 by mouth once daily as needed for indigestion 8)  Simvastatin 20 Mg Tabs (Simvastatin) .... One by mouth q pm 9)  Hydrochlorothiazide 12.5 Mg Tabs (Hydrochlorothiazide) .... One by mouth once daily  Patient Instructions: 1)  Please schedule a follow-up appointment in 6 months. 2)  BMP prior to visit, ICD-9: 401.9 3)  Please return for lab work one (1) week before your next appointment.  Prescriptions: HYDROCHLOROTHIAZIDE 12.5 MG TABS (HYDROCHLOROTHIAZIDE) one by mouth once daily  #90 x 1   Entered and Authorized by:   D. Thomos Lemons DO   Signed by:   D. Thomos Lemons DO on 09/07/2010   Method used:   Electronically to        [facility]. [address] [phone]* (retail)       [address]        [place], [place]  [address]       Ph: [phone] or [phone]       Fax: [phone]   RxID:   [id number]    Orders Added: 1)  Est. Patient Level III [47425]    Current Allergies (reviewed today): No known allergies

## 2010-10-05 NOTE — Consult Note (Signed)
Summary: [facility]  [facility]   Imported By: Maryln Gottron 09/28/2010 10:24:06  _____________________________________________________________________  External Attachment:    Type:   Image     Comment:   External Document

## 2010-10-23 ENCOUNTER — Other Ambulatory Visit: Payer: Self-pay | Admitting: Internal Medicine

## 2010-10-23 DIAGNOSIS — Z1231 Encounter for screening mammogram for malignant neoplasm of breast: Secondary | ICD-10-CM

## 2011-01-01 ENCOUNTER — Ambulatory Visit
Admission: RE | Admit: 2011-01-01 | Discharge: 2011-01-01 | Disposition: A | Discharge status: Home / Self Care | Payer: Medicare Other | Source: Ambulatory Visit | Attending: Internal Medicine | Admitting: Internal Medicine

## 2011-01-01 DIAGNOSIS — Z1231 Encounter for screening mammogram for malignant neoplasm of breast: Secondary | ICD-10-CM

## 2011-01-05 NOTE — Assessment & Plan Note (Signed)
[facility]                           PRIMARY CARE OFFICE NOTE   NAME:Ethridge, TYNIKA LUDDY                     MRN:          [id number]  DATE:10/31/2006                            DOB:          04/26/1941    CHIEF COMPLAINT:  New patient to practice/numbness, left arm and left  leg.   HISTORY OF PRESENT ILLNESS:  The patient is a 70 year old white female  here to establish primary care.  She moved to this area from [place],  [place].  She has a history of Sjogren's syndrome diagnosed at age 29,  as well as fibromyalgia.  She has been followed by a rheumatologist in  [place], and also has established with a rheumatologist in [place].  Her main complaint over the last 5 months has been left leg numbness, as  well as left arm numbness.  She does not recall any acute injury.  Has  no history of injury to cervical spine.  She does have a questionable  history of low back pain/sciatica.  The patient reports that legs feel  heavy at night, but the whole left arm and left leg experiences  numbness.  There is no associated weakness.  No other associated  symptoms.   She was recently evaluated by an internist at [facility].  She had blood work, which confirmed Sjogren's, which included  blood glucose that was mildly elevated and the patient was noted to have  elevated cholesterol.  Specifically, LDL was 169, total cholesterol is  289.  Thyroid functions were normal at 0.85.   PAST MEDICAL HISTORY SUMMARY:  1. Sjogren's syndrome.  2. Fibromyalgia.  3. History of depression.  4. Hyperlipidemia.  5. History of nephrolithiasis.  6. Status post breast biopsy in the year 2000.  7. Status post appendectomy and mass removed from the right lower      quadrant, benign.  8. Tonsillectomy in 1948.  9. Status post hysterectomy in 1988.   CURRENT MEDICATIONS:  1. Aspirin 81 mg 2 tablets once a day.  2. Multiple supplements, including zinc,  omega fatty acids,      glucosamine chondroitin sulfate, vitamin D, vitamin E, vitamin C, B      complex vitamin.   ALLERGIES TO MEDICATIONS:  None known.   SOCIAL HISTORY:  The patient is divorced since 15.  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   FAMILY HISTORY:  Mother and father are both deceased.  They had history  of colon cancer.  No report of coronary artery disease or type 2  diabetes.   HABITS:  She rarely drinks.  No history of tobacco use.   PREVENTATIVE CARE HISTORY:  She has had a screening colonoscopy, last 1  being in 2002, which was completely negative.   REVIEW OF SYSTEMS:  As noted above.  No changes in speech or difficulty  swallowing.  The patient denies any chest pain.  No shortness of breath.  No heartburn, nausea or vomiting, constipation, diarrhea.  She has  chronic dry eyes and dry mouth secondary to Sjogren's and chronic  achiness form  fibromyalgia.   PHYSICAL EXAM:  VITAL SIGNS:  Weight is 212 pounds, temperature 97.5,  pulse 93.  BP with a manual cuff in the left arm is 160/70.  GENERAL:  The patient is a pleasant well-developed, well-nourished 35-  year-old white female who appears her stated age.  HEENT:  Normocephalic, atraumatic.  Pupils are equal and reactive to  light bilaterally.  Extraocular motility was intact.  The patient was  anicteric.  Conjunctivae within normal limits.  The patient had no  nystagmus.  Oropharyngeal exam was unremarkable.  Uvula was midline.  External auditory canals and tympanic membranes were clear bilaterally.  NECK:  Supple with no adenopathy, carotid bruit or thyromegaly.  CHEST:  Normal respiratory effort.  Chest was clear to auscultation  bilaterally.  No rhonchi, rales, or wheezing.  CARDIOVASCULAR:  Regular rate and rhythm.  No significant murmurs, rubs,  or gallops appreciated.  ABDOMEN:  Soft and nontender.  Positive bowel sounds.  No organomegaly.  MUSCULOSKELETAL:  No  cyanosis, clubbing, or edema.  SKIN:  Warm and dry.  NEUROLOGIC:  Complete neurological exam was performed.  Cranial nerves 2-  12 are intact.  She was nonfocal.  The patient had 5/5 muscle strength  of upper and lower extremities.  She did not have any cerebellar signs.  No pronator drift.  The patient's reflexes were +1 upper extremities.  Difficult to obtain patellar due to previous surgery or injury to knee.  Negative Babinski.   IMPRESSION/RECOMMENDATIONS:  1. Left-sided paresthesias/numbness.  2. History of Sjogren's syndrome.  3. Elevated blood pressure without diagnosis of hypertension.  4. Hyperlipidemia.  5. History of nephrolithiasis.  6. Health maintenance.   RECOMMENDATIONS:  Unclear etiology of the patient's abnormal sensation  to the left side.  We will rule out lacunar stroke and send the patient  for an MRI of the brain.  Other possibilities include myelopathy.  We  will consider referral to neurology.   In terms of reducing her risk factors, she will be started on Crestor  today 10 mg at bedtime.  We will repeat LFTs in approximately 1 month.  The patient also has abnormal glucose; we will obtain hemoglobin A1c.   In terms of her blood pressure, she states that they have been somewhat  labile.  I instructed the patient to monitor home blood pressure  readings until our followup visit and to obtain at least 10 readings.     Barbette Hair. Artist Pais, DO  Electronically Signed    RDY/MedQ  DD: 10/31/2006  DT: 11/01/2006  Job #: [id number]

## 2011-01-05 NOTE — Assessment & Plan Note (Signed)
[facility]                                 ON-CALL NOTE   NAME:[name], [name]                     MRN:          [id number]  DATE:11/07/2006                            DOB:          1941/03/27    The patient is complaining of throbbing pain in the left side of her  head.  Advised to come to the hospital for evaluation.     Jeffrey A. Tawanna Cooler, MD  Electronically Signed    JAT/MedQ  DD: 11/07/2006  DT: 11/08/2006  Job #: [id number]

## 2011-01-05 NOTE — Op Note (Signed)
[name], [name]              ACCOUNT NO.:  [id number]   MEDICAL RECORD NO.:  [id number]          PATIENT TYPE:  AMB   LOCATION:  DAY                          FACILITY:  [facility]   PHYSICIAN:  Wilmon Arms. Corliss Skains, M.D. DATE OF BIRTH:  08-Jul-1941   DATE OF PROCEDURE:  04/25/2006  DATE OF DISCHARGE:                                 OPERATIVE REPORT   PREOPERATIVE DIAGNOSIS:  Appendiceal mass.   POSTOPERATIVE DIAGNOSIS:  Appendiceal mass.   PROCEDURE PERFORMED:  Laparoscopic appendectomy.   SURGEON:  Wilmon Arms. Corliss Skains, M.D.   ASSISTANT:  Adolph Pollack, M.D.   ANESTHESIA:  General endotracheal.   INDICATIONS:  The patient is a 70 year old female who initially presented  with some left flank pain.  She was evaluated with a CT scan which showed an  incidental finding of an enlarged tubular structure in the right lower  quadrant.  This measured 3.5 x 3.1 x 9 cm.  The patient has no symptoms in  this area.  However, due to the abnormal findings, she was referred for  surgical evaluation.  We recommended a laparoscopic appendectomy.   DESCRIPTION OF PROCEDURE:  The patient was brought to the operating room,  placed in the supine position on the operating room table with her arms  tucked.  After an adequate level of general anesthesia was obtained, the  patient's abdomen was prepped with Betadine and draped in sterile fashion.  A time-out was taken to assure the proper patient and proper procedure.  A  Foley catheter previously had been placed.  A vertical incision was made  just below the umbilicus.  Dissection was carried down to the fascia which  was opened vertically.  The peritoneal cavity was bluntly entered.  The  Hasson cannula was inserted and secured with a pursestring stay suture of  Vicryl.  Pneumoperitoneum was obtained by insufflating CO2, maintaining a  maximum pressure of 15 mmHg.  The patient was positioned in Trendelenburg  position, rotated slightly to her left.   The laparoscope was inserted.  A 5  mm port was placed in the left lower quadrant.  We mobilized the cecum  medially.  A very large tubular structure was seen in the right pericolic  gutter.  An additional 5 mm port was placed through the right upper  quadrant.  A grasper was used to elevate this large tubular structure.  Harmonic scalpel was used to take down the mesoappendix.  There was some  thick fibrous bands to the peritoneum which were lysed with the harmonic  scalpel.  We continued this down until we were able to visualize the base of  the appendix which was fairly narrow and normal in size.  We switched to a 5  mm scope in the left lower quadrant.  A 45 mm Endo-GIA stapler with blue  load was inserted through the [facility] cannula.  The base of the appendix was  then transected.  The appendix was then placed in an EndoCatch sac.  We  removed the Hasson cannula as we pulled EndoCatch sac up to the fascial  opening.  We had  to enlarge our fascial opening to allow removal of this  very large mass.  This was sent for pathologic examination.  We reinspected  the right lower quadrant.  No bleeding was noted.  We irrigated this area.  The ports were then removed under direct vision as the pneumoperitoneum was  released.  The fascia was closed with several figure-of-eight 0 Vicryl  sutures.  The 4-0 Monocryl was used to close skin in subcuticular fashion.  Steri-  Strips and clean dressings were applied.  The patient was then extubated and  brought to the recovery in stable condition.  The Foley catheter was removed  prior to her leaving the operating room.      Wilmon Arms. Tsuei, M.D.  Electronically Signed     MKT/MEDQ  D:  04/25/2006  T:  04/25/2006  Job:  [id number]

## 2011-01-09 ENCOUNTER — Other Ambulatory Visit: Payer: Self-pay | Admitting: *Deleted

## 2011-01-09 DIAGNOSIS — E785 Hyperlipidemia, unspecified: Secondary | ICD-10-CM

## 2011-01-09 MED ORDER — SIMVASTATIN 20 MG PO TABS: 20.0000 mg | ORAL_TABLET | Freq: Every evening | ORAL | Status: DC

## 2011-01-09 NOTE — Telephone Encounter (Signed)
Patient called and left voice message requesting a refill on Simvastatin. Her message states she is scheduled to follow up in July.  Rx refill sent to [facility] on [address]

## 2011-02-15 ENCOUNTER — Encounter: Payer: Self-pay | Admitting: Internal Medicine

## 2011-02-19 ENCOUNTER — Other Ambulatory Visit (INDEPENDENT_AMBULATORY_CARE_PROVIDER_SITE_OTHER): Payer: Medicare Other

## 2011-02-19 ENCOUNTER — Other Ambulatory Visit: Payer: Self-pay | Admitting: Internal Medicine

## 2011-02-19 DIAGNOSIS — I10 Essential (primary) hypertension: Secondary | ICD-10-CM

## 2011-02-19 LAB — BASIC METABOLIC PANEL
BUN: 11 mg/dL (ref 6–23)
Chloride: 102 mEq/L (ref 96–112)
Creatinine, Ser: 0.6 mg/dL (ref 0.4–1.2)
GFR: 99.26 mL/min (ref >60.00)
Potassium: 4.2 mEq/L (ref 3.5–5.1)

## 2011-02-26 ENCOUNTER — Ambulatory Visit (INDEPENDENT_AMBULATORY_CARE_PROVIDER_SITE_OTHER): Payer: Medicare Other | Admitting: Family

## 2011-02-26 ENCOUNTER — Encounter: Payer: Self-pay | Admitting: Family

## 2011-02-26 ENCOUNTER — Ambulatory Visit: Payer: Self-pay | Admitting: Internal Medicine

## 2011-02-26 ENCOUNTER — Telehealth: Payer: Self-pay | Admitting: Internal Medicine

## 2011-02-26 ENCOUNTER — Telehealth: Payer: Self-pay | Admitting: Family

## 2011-02-26 DIAGNOSIS — E785 Hyperlipidemia, unspecified: Secondary | ICD-10-CM

## 2011-02-26 DIAGNOSIS — R110 Nausea: Secondary | ICD-10-CM

## 2011-02-26 DIAGNOSIS — I10 Essential (primary) hypertension: Secondary | ICD-10-CM

## 2011-02-26 MED ORDER — SIMVASTATIN 20 MG PO TABS: 20.0000 mg | ORAL_TABLET | Freq: Every evening | ORAL | Status: DC

## 2011-02-26 NOTE — Assessment & Plan Note (Signed)
Pt notes + nausea about 1x a month without vomitting.  Does not wish to persue work up at this time.  I recommended that she contact us if symptoms worsen or become more frequent. Continue PPI.  Pt verbalizes understanding.

## 2011-02-26 NOTE — Patient Instructions (Addendum)
Schedule a nurse visit for 1 month.  Follow up in 3 months for a provider visit.

## 2011-02-26 NOTE — Telephone Encounter (Signed)
Pt made a medicare wellness appt for 06-05-11. Pt was wondering if she needs lab work for this appt?

## 2011-02-26 NOTE — Progress Notes (Signed)
Subjective:    Patient ID: [name], female    DOB: [birth year], 70 y.o.   MRN: [id number]  HPI Patient presents today for followup of hypertension.  Patient has been treated for Chronic HTN for quite sometime. She is currently on HCTZ, and well controlled. No associated S/S related to HTN.   Quality: chronic Modifying factor: meds Duration: Quite sometime Associated S/S: None.  The patient denies the following associated symptoms: Chest pain, dyspnea, blurred vision, headache, or lower extremity edema.  Notes intermittent nausea.  Happens about once a month.    Review of Systems See HPI  Past Medical History  Diagnosis Date  . Hyperlipidemia   . Sjogren's disease   . Meningioma 11/2009    right post parietal 16mm - stable 11/2009 MRI  . Fibromyalgia   . GERD (gastroesophageal reflux disease)   . Hypertension   . Diverticulosis   . Arthritis     right thumb CMC arthritis    History   Social History  . Marital Status: Divorced    Spouse Name: N/A    Number of Children: N/A  . Years of Education: N/A   Occupational History  . Retired    Social History Main Topics  . Smoking status: Never Smoker   . Smokeless tobacco: Not on file  . Alcohol Use: No  . Drug Use:   . Sexually Active:    Other Topics Concern  . Not on file   Social History Narrative  . No narrative on file    No past surgical history on file.  Family History  Problem Relation Age of Onset  . Diabetes Neg Hx   . Cancer Other     colon    No Known Allergies  Current Outpatient Prescriptions on File Prior to Visit  Medication Sig Dispense Refill  . Calcium Carbonate-Vitamin D (CALCIUM + D) 600-200 MG-UNIT TABS Take 2 tablets by mouth daily.        . folic acid (FOLVITE) 1 MG tablet Take 1 mg by mouth daily.        . Glucosamine-Chondroit-Vit C-Mn (GLUCOSAMINE 1500 COMPLEX) CAPS Take 1,500 capsules by mouth 2 (two) times daily.        . hydrochlorothiazide  (,MICROZIDE/HYDRODIURIL,) 12.5 MG capsule Take 12.5 mg by mouth daily.        . Omega-3 Fatty Acids (FISH OIL) 1000 MG CAPS Take 1,000 capsules by mouth daily.        Marland Kitchen omeprazole (PRILOSEC) 20 MG capsule Take 20 mg by mouth daily. As needed for indigestion       . Potassium 99 MG TABS Take 99 mg by mouth daily.        . simvastatin (ZOCOR) 20 MG tablet Take 1 tablet (20 mg total) by mouth every evening.  30 tablet  3  . vitamin B-12 (CYANOCOBALAMIN) 250 MCG tablet Take 250 mcg by mouth daily.          BP 110/70  Pulse 84  Temp(Src) 97.8 F (36.6 C) (Oral)  Resp 16  Ht 5\' 9"  (1.753 m)  Wt 214 lb 1.3 oz (97.106 kg)  BMI 31.61 kg/m2       Objective:   Physical Exam  Constitutional: She appears well-developed and well-nourished.  Cardiovascular: Normal rate and regular rhythm.   Pulmonary/Chest: Effort normal and breath sounds normal.  Skin: Skin is warm and dry.  Psychiatric: She has a normal mood and affect. Her behavior is normal. Judgment and thought content normal.  Assessment & Plan:

## 2011-02-26 NOTE — Assessment & Plan Note (Signed)
Tolerating simvastatin.  Will try to add on LFT to her draw from the other day.  Continue statin.

## 2011-02-26 NOTE — Assessment & Plan Note (Addendum)
BP Readings from Last 3 Encounters:  02/26/11 110/70  09/07/10 130/70  08/23/10 130/74   BP looks very good. Only on 12.5mg  of HCTZ.  Will discontinue and plan to have her follow up in 1 month for a BP check.

## 2011-02-27 NOTE — Telephone Encounter (Signed)
She should come fasting to her [facility] apt and Dr. Rodena Medin can decide which labs he wants to order at that time.

## 2011-02-27 NOTE — Telephone Encounter (Signed)
Pt notified and voices understanding. 

## 2011-02-27 NOTE — Telephone Encounter (Signed)
Please advise 

## 2011-03-07 ENCOUNTER — Telehealth: Payer: Self-pay | Admitting: Internal Medicine

## 2011-03-07 NOTE — Telephone Encounter (Signed)
Patient has medicare wellness appt 05-15-11. Patient wants her cholesterol checked for this appt. She states that she will go to the [place] lab prior to appt.

## 2011-03-07 NOTE — Telephone Encounter (Signed)
Please advise what labs and dx pt should have before wellness exam.

## 2011-03-07 NOTE — Telephone Encounter (Signed)
Apparently answered 7/10. Can order lipid/lft 272.4. Other labs would depend on problems/issues not on medicare wellness.

## 2011-03-08 NOTE — Telephone Encounter (Signed)
I have attempted to contact this patient by phone with the following results: left message to return my call on answering machine.  Advised pt I am not at [facility] location today, and to call [phone] and ask for me.

## 2011-03-12 NOTE — Telephone Encounter (Signed)
Notified pt. Lab appt entered for [name] the week of 05/07/11. Pt requested lab result from 02/19/11 and was notified of normal result.

## 2011-03-12 NOTE — Telephone Encounter (Signed)
Please see 03/07/11 documentation.

## 2011-04-02 ENCOUNTER — Ambulatory Visit: Payer: Medicare Other | Admitting: Family

## 2011-05-04 ENCOUNTER — Other Ambulatory Visit: Payer: Self-pay | Admitting: Internal Medicine

## 2011-05-04 DIAGNOSIS — E785 Hyperlipidemia, unspecified: Secondary | ICD-10-CM

## 2011-05-05 ENCOUNTER — Encounter: Payer: Self-pay | Admitting: Internal Medicine

## 2011-05-05 DIAGNOSIS — Z1289 Encounter for screening for malignant neoplasm of other sites: Secondary | ICD-10-CM

## 2011-05-05 DIAGNOSIS — Z0000 Encounter for general adult medical examination without abnormal findings: Secondary | ICD-10-CM

## 2011-05-07 ENCOUNTER — Other Ambulatory Visit (INDEPENDENT_AMBULATORY_CARE_PROVIDER_SITE_OTHER): Payer: Medicare Other

## 2011-05-07 DIAGNOSIS — E785 Hyperlipidemia, unspecified: Secondary | ICD-10-CM

## 2011-05-07 LAB — LIPID PANEL
HDL: 54.5 mg/dL (ref >39.00)
LDL Cholesterol: 112 mg/dL — ABNORMAL HIGH (ref 0–99)
VLDL: 25.8 mg/dL (ref 0.0–40.0)

## 2011-05-07 LAB — HEPATIC FUNCTION PANEL
ALT: 34 U/L (ref 0–35)
Total Bilirubin: 0.5 mg/dL (ref 0.3–1.2)
Total Protein: 7.6 g/dL (ref 6.0–8.3)

## 2011-05-15 ENCOUNTER — Encounter: Payer: Self-pay | Admitting: Internal Medicine

## 2011-05-15 ENCOUNTER — Ambulatory Visit (INDEPENDENT_AMBULATORY_CARE_PROVIDER_SITE_OTHER): Payer: Medicare Other | Admitting: Internal Medicine

## 2011-05-15 ENCOUNTER — Telehealth: Payer: Self-pay | Admitting: Internal Medicine

## 2011-05-15 DIAGNOSIS — Z0000 Encounter for general adult medical examination without abnormal findings: Secondary | ICD-10-CM

## 2011-05-15 DIAGNOSIS — I10 Essential (primary) hypertension: Secondary | ICD-10-CM

## 2011-05-15 DIAGNOSIS — E785 Hyperlipidemia, unspecified: Secondary | ICD-10-CM

## 2011-05-15 DIAGNOSIS — Z79899 Other long term (current) drug therapy: Secondary | ICD-10-CM

## 2011-05-15 NOTE — Progress Notes (Signed)
  Subjective:    Patient ID: [name], female    DOB: [birth year], 70 y.o.   MRN: [id number]  HPI Pt presents to clinic for annual medicare wellness visit. Notes 2 wk h/o left eye twitching. No change in vision and was evaluated by eye doctor with reportedly nl exam. Reports nl BMD at [facility] study ~2-3 years ago. Mammogram utd 3/12. Has h/o small meningioma noted on mri and followed annually by neurosurgery. No associated neurologic deficits. Tolerates statin tx without myalgias or abn lft's. Reviewed lipid/lft results with pt. States received pneumovax in past. Unsure of date and has records at home.   No evidence of cognitive impairment. Depression screen negative. No gait instability, falls, or safety issues at home. Performs adl's. Discussed advanced care planning and pt indicates having living will.   Past Medical History  Diagnosis Date  . Hyperlipidemia   . Sjogren's disease   . Meningioma 11/2009    right post parietal 16mm - stable 11/2009 MRI  . Fibromyalgia   . GERD (gastroesophageal reflux disease)   . Hypertension   . Diverticulosis   . Arthritis     right thumb CMC arthritis   No past surgical history on file.  reports that she has never smoked. She does not have any smokeless tobacco history on file. She reports that she does not drink alcohol. Her drug history not on file. family history includes Cancer in her other.  There is no history of Diabetes. No Known Allergies     Review of Systems     Objective:   Physical Exam  Physical Exam  Nursing note and vitals reviewed. Constitutional: Appears well-developed and well-nourished. No distress.  HENT:  Head: Normocephalic and atraumatic.  Right Ear: External ear normal.  Left Ear: External ear normal.  Eyes: Conjunctivae are normal. No scleral icterus.  Neck: Neck supple. Carotid bruit is not present.  Cardiovascular: Normal rate, regular rhythm and normal heart sounds.  Exam reveals no gallop and  no friction rub.   No murmur heard. Pulmonary/Chest: Effort normal and breath sounds normal. No respiratory distress. He has no wheezes. no rales.  Lymphadenopathy:    He has no cervical adenopathy.  Neurological:Alert.  Skin: Skin is warm and dry. Not diaphoretic.  Psychiatric: Has a normal mood and affect.        Assessment & Plan:

## 2011-05-15 NOTE — Assessment & Plan Note (Signed)
Nl exam. Discussed blepharospasm pathophysiology and and typically benign nature. Followup if no improvement or worsening.

## 2011-05-15 NOTE — Telephone Encounter (Signed)
Lab orders entered for [facility] for March 2013.

## 2011-05-15 NOTE — Patient Instructions (Signed)
Please schedule cbc 401.9 chem7 v58.69, lipid/lft 272.4 prior to next visit 

## 2011-05-30 ENCOUNTER — Telehealth: Payer: Self-pay | Admitting: *Deleted

## 2011-05-30 NOTE — Telephone Encounter (Signed)
Pt is asking for a recommendation for a plastic surgeon to do a breast reduction, please.

## 2011-06-05 ENCOUNTER — Ambulatory Visit: Payer: Medicare Other | Admitting: Internal Medicine

## 2011-06-06 NOTE — Telephone Encounter (Signed)
Notified pt. 

## 2011-06-06 NOTE — Telephone Encounter (Signed)
Dr. Leeroy Bock

## 2011-09-17 ENCOUNTER — Other Ambulatory Visit: Payer: Self-pay | Admitting: Family

## 2011-10-22 ENCOUNTER — Other Ambulatory Visit: Payer: Self-pay | Admitting: Internal Medicine

## 2011-10-22 DIAGNOSIS — Z1231 Encounter for screening mammogram for malignant neoplasm of breast: Secondary | ICD-10-CM

## 2011-10-31 ENCOUNTER — Telehealth: Payer: Self-pay | Admitting: Internal Medicine

## 2011-10-31 MED ORDER — OMEPRAZOLE 20 MG PO CPDR: 20.0000 mg | DELAYED_RELEASE_CAPSULE | Freq: Every day | ORAL | Status: DC

## 2011-10-31 NOTE — Telephone Encounter (Signed)
Refill-omeprazole 20mg  capsules. Take one capsule by mouth daily as needed for indegestion. Qty 90 last fill 8.5.12

## 2011-10-31 NOTE — Telephone Encounter (Signed)
rx sent in electronically 

## 2011-11-05 ENCOUNTER — Other Ambulatory Visit (INDEPENDENT_AMBULATORY_CARE_PROVIDER_SITE_OTHER): Payer: Medicare Other

## 2011-11-05 DIAGNOSIS — E785 Hyperlipidemia, unspecified: Secondary | ICD-10-CM

## 2011-11-05 DIAGNOSIS — Z79899 Other long term (current) drug therapy: Secondary | ICD-10-CM

## 2011-11-05 DIAGNOSIS — I10 Essential (primary) hypertension: Secondary | ICD-10-CM

## 2011-11-05 LAB — CBC WITH DIFFERENTIAL/PLATELET
Basophils Relative: 0.6 % (ref 0.0–3.0)
Eosinophils Absolute: 0.2 10*3/uL (ref 0.0–0.7)
Eosinophils Relative: 3.4 % (ref 0.0–5.0)
HCT: 35.8 % — ABNORMAL LOW (ref 36.0–46.0)
Hemoglobin: 11.9 g/dL — ABNORMAL LOW (ref 12.0–15.0)
Lymphs Abs: 0.9 10*3/uL (ref 0.7–4.0)
MCHC: 33.2 g/dL (ref 30.0–36.0)
MCV: 89.6 fl (ref 78.0–100.0)
Monocytes Absolute: 0.8 10*3/uL (ref 0.1–1.0)
Neutro Abs: 4.8 10*3/uL (ref 1.4–7.7)
RBC: 4 Mil/uL (ref 3.87–5.11)
WBC: 6.7 10*3/uL (ref 4.5–10.5)

## 2011-11-05 LAB — LIPID PANEL
HDL: 54.6 mg/dL (ref >39.00)
Total CHOL/HDL Ratio: 4
VLDL: 32 mg/dL (ref 0.0–40.0)

## 2011-11-05 LAB — HEPATIC FUNCTION PANEL
ALT: 28 U/L (ref 0–35)
Albumin: 3.8 g/dL (ref 3.5–5.2)
Bilirubin, Direct: 0 mg/dL (ref 0.0–0.3)
Total Protein: 7.7 g/dL (ref 6.0–8.3)

## 2011-11-05 LAB — BASIC METABOLIC PANEL
CO2: 31 mEq/L (ref 19–32)
Chloride: 103 mEq/L (ref 96–112)
Potassium: 4.8 mEq/L (ref 3.5–5.1)

## 2011-11-12 ENCOUNTER — Ambulatory Visit (INDEPENDENT_AMBULATORY_CARE_PROVIDER_SITE_OTHER): Payer: Medicare Other | Admitting: Internal Medicine

## 2011-11-12 ENCOUNTER — Encounter: Payer: Self-pay | Admitting: Internal Medicine

## 2011-11-12 ENCOUNTER — Ambulatory Visit: Payer: Medicare Other | Admitting: Internal Medicine

## 2011-11-12 VITALS — BP 170/88 | HR 104 | Temp 98.3°F | Ht 69.0 in | Wt 215.0 lb

## 2011-11-12 DIAGNOSIS — R002 Palpitations: Secondary | ICD-10-CM

## 2011-11-12 DIAGNOSIS — R0789 Other chest pain: Secondary | ICD-10-CM | POA: Insufficient documentation

## 2011-11-12 DIAGNOSIS — I10 Essential (primary) hypertension: Secondary | ICD-10-CM

## 2011-11-12 LAB — T4, FREE: Free T4: 0.78 ng/dL (ref 0.60–1.60)

## 2011-11-12 LAB — TSH: TSH: 0.8 u[IU]/mL (ref 0.35–5.50)

## 2011-11-12 MED ORDER — HYDROCHLOROTHIAZIDE 12.5 MG PO CAPS: 12.5000 mg | ORAL_CAPSULE | Freq: Every day | ORAL | Status: DC

## 2011-11-12 MED ORDER — METOPROLOL SUCCINATE ER 25 MG PO TB24: 25.0000 mg | ORAL_TABLET | Freq: Every day | ORAL | Status: DC

## 2011-11-12 NOTE — Assessment & Plan Note (Signed)
Atypical left sided chest pain 71 year-old female with history of hypertension and hyperlipidemia. EKG is normal. Refer to cardiology for further testing.

## 2011-11-12 NOTE — Patient Instructions (Signed)
Our office will contact you re: cardiology referral

## 2011-11-12 NOTE — Assessment & Plan Note (Signed)
EKG is normal.  Consider Holter monitor. Check thyroid studies.

## 2011-11-12 NOTE — Assessment & Plan Note (Signed)
Blood pressure is uncontrolled. No exacerbating factors. Restart hydrochlorothiazide at 12.5 mg. Also start metoprolol succinate 25 mg once daily.

## 2011-11-12 NOTE — Progress Notes (Signed)
Subjective:    Patient ID: [name], female    DOB: 08/31/1940, 71 y.o.   MRN: [id number]  HPI  71 year old white female with a past medical history of hypertension and hyperlipidemia presents with atypical left chest pain and nocturnal palpitations. Her symptoms have been ongoing for several months. Patient reports a heart beating very fast mainly at bedtime. There is no associated dizziness or lightheadedness.  Her chest pains are episodic. They usually last approximately 20 minutes. They are nonexertional. There are no radicular symptoms. No associated diaphoresis or shortness of breath.  In regards to her blood pressure. Patient denies taking any over-the-counter supplements. She was previously on hydrochlorothiazide but discontinued due to her blood pressures trending lower by nurse practitioner.   Review of Systems Increased life stressors at home- her heating cooling unit  needs to be replaced Past Medical History  Diagnosis Date  . Hyperlipidemia   . Sjogren's disease   . Meningioma 11/2009    right post parietal 16mm - stable 11/2009 MRI  . Fibromyalgia   . GERD (gastroesophageal reflux disease)   . Hypertension   . Diverticulosis   . Arthritis     right thumb C[facility] arthritis    History   Social History  . Marital Status: Divorced    Spouse Name: N/A    Number of Children: N/A  . Years of Education: N/A   Occupational History  . Retired    Social History Main Topics  . Smoking status: Never Smoker   . Smokeless tobacco: Never Used  . Alcohol Use: No  . Drug Use: No  . Sexually Active: Not on file   Other Topics Concern  . Not on file   Social History Narrative  . No narrative on file    No past surgical history on file.  Family History  Problem Relation Age of Onset  . Diabetes Neg Hx   . Cancer Other     colon    No Known Allergies  Current Outpatient Prescriptions on File Prior to Visit  Medication Sig Dispense Refill  . Calcium  Carbonate-Vitamin D (CALCIUM + D) 600-200 MG-UNIT TABS Take 2 tablets by mouth daily.        . folic acid (FOLVITE) 1 MG tablet Take 1 mg by mouth daily.        . Glucosamine-Chondroit-Vit C-Mn (GLUCOSAMINE 1500 COMPLEX) CAPS Take 1,500 capsules by mouth 2 (two) times daily.        . Omega-3 Fatty Acids (FISH OIL) 1000 MG CAPS Take 1,000 capsules by mouth daily.        Marland Kitchen omeprazole (PRILOSEC) 20 MG capsule Take 1 capsule (20 mg total) by mouth daily. As needed for indigestion  90 capsule  3  . Potassium 99 MG TABS Take 99 mg by mouth daily.        . simvastatin (ZOCOR) 20 MG tablet TAKE 1 TABLET BY MOUTH EVERY EVENING  30 tablet  2  . vitamin B-12 (CYANOCOBALAMIN) 250 MCG tablet Take 250 mcg by mouth every other day.         BP 170/88  Pulse 104  Temp(Src) 98.3 F (36.8 C) (Oral)  Ht 5\' 9"  (1.753 m)  Wt 215 lb (97.523 kg)  BMI 31.75 kg/m2   EKG shows normal sinus rhythm 84 beats per minute, no ST changes.    Objective:   Physical Exam  Constitutional: She appears well-developed and well-nourished.  HENT:  Head: Normocephalic and atraumatic.  Neck: Normal range of  motion. Neck supple.  Cardiovascular: Normal rate, regular rhythm and normal heart sounds.   Pulmonary/Chest: Effort normal and breath sounds normal. She has no wheezes. She has no rales.  Abdominal: Soft. Bowel sounds are normal. She exhibits no mass.  Musculoskeletal: She exhibits no edema.  Skin: Skin is warm and dry.  Psychiatric: She has a normal mood and affect. Her behavior is normal.       Assessment & Plan:

## 2011-11-13 ENCOUNTER — Other Ambulatory Visit: Payer: Self-pay | Admitting: Neurosurgery

## 2011-11-13 DIAGNOSIS — D332 Benign neoplasm of brain, unspecified: Secondary | ICD-10-CM

## 2011-11-23 ENCOUNTER — Ambulatory Visit
Admission: RE | Admit: 2011-11-23 | Discharge: 2011-11-23 | Disposition: A | Discharge status: Home / Self Care | Payer: Medicare Other | Source: Ambulatory Visit | Attending: Neurosurgery | Admitting: Neurosurgery

## 2011-11-23 DIAGNOSIS — D332 Benign neoplasm of brain, unspecified: Secondary | ICD-10-CM

## 2011-11-23 MED ORDER — GADOBENATE DIMEGLUMINE 529 MG/ML IV SOLN
15.0000 mL | Freq: Once | INTRAVENOUS | Status: AC | PRN
  Administered 2011-11-23: 15 mL via INTRAVENOUS

## 2011-12-12 ENCOUNTER — Encounter: Payer: Self-pay | Admitting: Gastroenterology

## 2011-12-12 ENCOUNTER — Ambulatory Visit: Payer: Medicare Other | Admitting: Cardiology

## 2011-12-20 ENCOUNTER — Ambulatory Visit (INDEPENDENT_AMBULATORY_CARE_PROVIDER_SITE_OTHER): Payer: Medicare Other | Admitting: Cardiology

## 2011-12-20 ENCOUNTER — Encounter: Payer: Self-pay | Admitting: Cardiology

## 2011-12-20 DIAGNOSIS — R079 Chest pain, unspecified: Secondary | ICD-10-CM

## 2011-12-20 DIAGNOSIS — R002 Palpitations: Secondary | ICD-10-CM

## 2011-12-20 DIAGNOSIS — E785 Hyperlipidemia, unspecified: Secondary | ICD-10-CM

## 2011-12-20 DIAGNOSIS — I10 Essential (primary) hypertension: Secondary | ICD-10-CM

## 2011-12-20 MED ORDER — ATENOLOL 25 MG PO TABS: 25.0000 mg | ORAL_TABLET | Freq: Two times a day (BID) | ORAL | Status: DC

## 2011-12-20 NOTE — Progress Notes (Signed)
HPI: 71 year-old female with no prior cardiac history for evaluation of chest pain and palpitations. Patient has had occasional chest pain for over one year. It is in the left upper chest area. It lasts approximately 20 minutes and resolves. It is not exertional, pleuritic or positional. It does not radiate. No associated symptoms. She also states that her heart will beat hard and flutter particularly when she lies down at night. She is unable to tell me how long it lasts. It recently improved with low-dose metoprolol. However the metoprolol caused fatigue. She has some dyspnea on exertion but no orthopnea or PND. No syncope.  Current Outpatient Prescriptions  Medication Sig Dispense Refill  . Calcium Carbonate-Vitamin D (CALCIUM + D) 600-200 MG-UNIT TABS Take 2 tablets by mouth daily.        . folic acid (FOLVITE) 1 MG tablet Take 1 mg by mouth daily.        . Glucosamine-Chondroit-Vit C-Mn (GLUCOSAMINE 1500 COMPLEX) CAPS Take 1,500 capsules by mouth 2 (two) times daily.        . hydrochlorothiazide (MICROZIDE) 12.5 MG capsule Take 1 capsule (12.5 mg total) by mouth daily.  30 capsule  1  . metoprolol succinate (TOPROL-XL) 25 MG 24 hr tablet Take 1 tablet (25 mg total) by mouth daily.  30 tablet  1  . Omega-3 Fatty Acids (FISH OIL) 1000 MG CAPS Take 1,000 capsules by mouth daily.        Marland Kitchen omeprazole (PRILOSEC) 20 MG capsule Take 1 capsule (20 mg total) by mouth daily. As needed for indigestion  90 capsule  3  . Potassium 99 MG TABS Take 99 mg by mouth daily.        . simvastatin (ZOCOR) 20 MG tablet TAKE 1 TABLET BY MOUTH EVERY EVENING  30 tablet  2  . vitamin B-12 (CYANOCOBALAMIN) 250 MCG tablet Take 250 mcg by mouth every other day.       Marland Kitchen atenolol (TENORMIN) 25 MG tablet Take 1 tablet (25 mg total) by mouth 2 (two) times daily.  60 tablet  12    No Known Allergies  Past Medical History  Diagnosis Date  . Hyperlipidemia   . Sjogren's disease   . Meningioma 11/2009    right post parietal  16mm - stable 11/2009 MRI  . Fibromyalgia   . GERD (gastroesophageal reflux disease)   . Hypertension   . Diverticulosis   . Arthritis     right thumb CMC arthritis  . Nephrolithiasis     Past Surgical History  Procedure Date  . Vaginal hysterectomy 1989  . Kidney stone surgery 1974  . Mass excision 2008  . Appendectomy 2008  . Tonsillectomy     History   Social History  . Marital Status: Divorced    Spouse Name: N/A    Number of Children: 2  . Years of Education: N/A   Occupational History  . Retired    Social History Main Topics  . Smoking status: Never Smoker   . Smokeless tobacco: Never Used  . Alcohol Use: Yes     Rarely  . Drug Use: No  . Sexually Active: Not on file   Other Topics Concern  . Not on file   Social History Narrative  . No narrative on file    Family History  Problem Relation Age of Onset  . Diabetes Neg Hx   . Cancer Other     colon  . Colon cancer Mother   . Colon cancer Father  ROS: Multiple aches from fibromyalgia but no fevers or chills, productive cough, hemoptysis, dysphasia, odynophagia, melena, hematochezia, dysuria, hematuria, rash, seizure activity, orthopnea, PND, pedal edema, claudication. Remaining systems are negative.  Physical Exam:  Blood pressure 151/79, pulse 99, height 5\' 9"  (1.753 m), weight 98.031 kg (216 lb 1.9 oz).  General:  Well developed/well nourished, anxious in NAD Skin warm/dry Patient not depressed No peripheral clubbing Back-normal HEENT-normal/normal eyelids Neck supple/normal carotid upstroke bilaterally; no bruits; no JVD; no thyromegaly chest - CTA/ normal expansion CV - RRR/normal S1 and S2; no murmurs, rubs or gallops;  PMI nondisplaced Abdomen -NT/ND, no HSM, no mass, + bowel sounds, no bruit 2+ femoral pulses, no bruits Ext-no edema, chords, 2+ DP Neuro-grossly nonfocal  ECG 11/12/2011-sinus rhythm at a rate of 84. No ST changes.

## 2011-12-20 NOTE — Assessment & Plan Note (Signed)
Blood pressure elevated. Add atenolol 25 mg by mouth twice a day and follow.

## 2011-12-20 NOTE — Assessment & Plan Note (Addendum)
Electrocardiogram is normal. She did not tolerate Toprol because of fatigue. I will try atenolol 25 mg by mouth twice a day both for blood pressure and palpitations. If her symptoms worsen we will consider a monitor.note recent TSH normal.

## 2011-12-20 NOTE — Assessment & Plan Note (Signed)
Management per primary care. 

## 2011-12-20 NOTE — Assessment & Plan Note (Signed)
Symptoms atypical. Schedule stress echocardiogram to exclude ischemia and also to quantify LV function given palpitations.

## 2011-12-20 NOTE — Patient Instructions (Signed)
Your physician recommends that you schedule a follow-up appointment in: 56 WEEKS  Your physician has requested that you have a stress echocardiogram. For further information please visit [email or web address] Please follow instruction sheet as given.   START ATENOLOL 25 MG ONE TABLET TWICE DAILY

## 2011-12-21 ENCOUNTER — Encounter: Payer: Self-pay | Admitting: Gastroenterology

## 2011-12-24 ENCOUNTER — Ambulatory Visit (INDEPENDENT_AMBULATORY_CARE_PROVIDER_SITE_OTHER): Payer: Medicare Other | Admitting: Internal Medicine

## 2011-12-24 ENCOUNTER — Encounter: Payer: Self-pay | Admitting: Internal Medicine

## 2011-12-24 VITALS — BP 120/68 | Temp 98.3°F | Wt 217.0 lb

## 2011-12-24 DIAGNOSIS — R002 Palpitations: Secondary | ICD-10-CM

## 2011-12-24 DIAGNOSIS — I10 Essential (primary) hypertension: Secondary | ICD-10-CM

## 2011-12-24 MED ORDER — NEBIVOLOL HCL 5 MG PO TABS: 2.5000 mg | ORAL_TABLET | Freq: Every day | ORAL | Status: DC

## 2011-12-24 NOTE — Progress Notes (Signed)
Subjective:    Patient ID: [name], female    DOB: 01-22-1941, 71 y.o.   MRN: [id number]  HPI  71 year old white female recently seen for palpitations and atypical chest pain her followup. She was seen by cardiologist. Stress echo has been scheduled. Metoprolol XL was discontinued due to his side effect of severe fatigue. She was switched to atenolol 25 mg twice a day. Since starting atenolol patient complains of loose stools and significant worsening of her heartburn.    She also complains of weight gain and persistent fatigue.  Review of Systems Worsening heartburn, fatigue  Past Medical History  Diagnosis Date  . Hyperlipidemia   . Sjogren's disease   . Meningioma 11/2009    right post parietal 16mm - stable 11/2009 MRI  . Fibromyalgia   . GERD (gastroesophageal reflux disease)   . Hypertension   . Diverticulosis   . Arthritis     right thumb C[facility] arthritis  . Nephrolithiasis     History   Social History  . Marital Status: Divorced    Spouse Name: N/A    Number of Children: 2  . Years of Education: N/A   Occupational History  . Retired    Social History Main Topics  . Smoking status: Never Smoker   . Smokeless tobacco: Never Used  . Alcohol Use: Yes     Rarely  . Drug Use: No  . Sexually Active: Not on file   Other Topics Concern  . Not on file   Social History Narrative  . No narrative on file    Past Surgical History  Procedure Date  . Vaginal hysterectomy 1989  . Kidney stone surgery 1974  . Mass excision 2008  . Appendectomy 2008  . Tonsillectomy     Family History  Problem Relation Age of Onset  . Diabetes Neg Hx   . Cancer Other     colon  . Colon cancer Mother   . Colon cancer Father     No Known Allergies  Current Outpatient Prescriptions on File Prior to Visit  Medication Sig Dispense Refill  . Calcium Carbonate-Vitamin D (CALCIUM + D) 600-200 MG-UNIT TABS Take 2 tablets by mouth daily.        . folic acid (FOLVITE) 1 MG  tablet Take 1 mg by mouth daily.        . Glucosamine-Chondroit-Vit C-Mn (GLUCOSAMINE 1500 COMPLEX) CAPS Take 1,500 capsules by mouth 2 (two) times daily.        . Omega-3 Fatty Acids (FISH OIL) 1000 MG CAPS Take 1,000 capsules by mouth daily.        Marland Kitchen omeprazole (PRILOSEC) 20 MG capsule Take 1 capsule (20 mg total) by mouth daily. As needed for indigestion  90 capsule  3  . simvastatin (ZOCOR) 20 MG tablet TAKE 1 TABLET BY MOUTH EVERY EVENING  30 tablet  2  . vitamin B-12 (CYANOCOBALAMIN) 250 MCG tablet Take 250 mcg by mouth every other day.       . nebivolol (BYSTOLIC) 5 MG tablet Take 0.5 tablets (2.5 mg total) by mouth daily.  30 tablet  0    BP 120/68  Temp(Src) 98.3 F (36.8 C) (Oral)  Wt 217 lb (98.431 kg)       Objective:   Physical Exam  Constitutional: She is oriented to person, place, and time. She appears well-developed and well-nourished.  Cardiovascular: Normal rate, regular rhythm and normal heart sounds.   Pulmonary/Chest: Effort normal and breath sounds normal. She has  no wheezes.  Neurological: She is alert and oriented to person, place, and time.  Psychiatric: She has a normal mood and affect. Her behavior is normal.          Assessment & Plan:

## 2011-12-24 NOTE — Patient Instructions (Signed)
Stop taking hydrochlorothiazide, atenolol, potassium supplements.

## 2011-12-24 NOTE — Assessment & Plan Note (Signed)
Patient unable to tolerate metoprolol or atenolol due to fatigue and diarrhea.  Trial of Bystolic 2.5 mg once daily.

## 2011-12-24 NOTE — Assessment & Plan Note (Signed)
Improved but pt not able to tolerated atenolol or hctz.  Atenolol causing diarrhea and fatigue.   HCTZ may be exacerbation her GERD.  Trial of bystolic.

## 2011-12-27 ENCOUNTER — Telehealth: Payer: Self-pay | Admitting: Cardiology

## 2011-12-27 NOTE — Telephone Encounter (Signed)
Spoke with pt  She was changed to Bystolic and wanted to know if she should hold prior to stress test as she was going to hold her Atenolol.  I informed her she should hold the Bystolic also  Patient aware and will do as instructed

## 2011-12-27 NOTE — Telephone Encounter (Signed)
Pt has medication questions  °

## 2011-12-28 ENCOUNTER — Other Ambulatory Visit: Payer: Self-pay | Admitting: Internal Medicine

## 2011-12-28 MED ORDER — SIMVASTATIN 20 MG PO TABS: 20.0000 mg | ORAL_TABLET | Freq: Every day | ORAL | Status: DC

## 2011-12-28 NOTE — Telephone Encounter (Signed)
Pt needs refill on simvastatin 20 mg call into [facility] [phone]. Pt is out

## 2011-12-28 NOTE — Telephone Encounter (Signed)
rx sent in electronically 

## 2012-01-02 ENCOUNTER — Ambulatory Visit
Admission: RE | Admit: 2012-01-02 | Discharge: 2012-01-02 | Disposition: A | Discharge status: Home / Self Care | Payer: Medicare Other | Source: Ambulatory Visit | Attending: Internal Medicine | Admitting: Internal Medicine

## 2012-01-02 DIAGNOSIS — Z1231 Encounter for screening mammogram for malignant neoplasm of breast: Secondary | ICD-10-CM

## 2012-01-03 ENCOUNTER — Other Ambulatory Visit (HOSPITAL_COMMUNITY): Payer: Self-pay | Admitting: Cardiology

## 2012-01-04 ENCOUNTER — Ambulatory Visit (HOSPITAL_COMMUNITY): Payer: Medicare Other | Attending: Internal Medicine

## 2012-01-04 ENCOUNTER — Ambulatory Visit (HOSPITAL_BASED_OUTPATIENT_CLINIC_OR_DEPARTMENT_OTHER): Payer: Medicare Other

## 2012-01-04 ENCOUNTER — Encounter: Payer: Self-pay | Admitting: Cardiology

## 2012-01-04 DIAGNOSIS — R0609 Other forms of dyspnea: Secondary | ICD-10-CM | POA: Insufficient documentation

## 2012-01-04 DIAGNOSIS — R072 Precordial pain: Secondary | ICD-10-CM | POA: Insufficient documentation

## 2012-01-04 DIAGNOSIS — R0989 Other specified symptoms and signs involving the circulatory and respiratory systems: Secondary | ICD-10-CM | POA: Insufficient documentation

## 2012-01-04 DIAGNOSIS — R5383 Other fatigue: Secondary | ICD-10-CM | POA: Insufficient documentation

## 2012-01-04 DIAGNOSIS — R5381 Other malaise: Secondary | ICD-10-CM | POA: Insufficient documentation

## 2012-01-04 DIAGNOSIS — E785 Hyperlipidemia, unspecified: Secondary | ICD-10-CM | POA: Insufficient documentation

## 2012-01-04 DIAGNOSIS — I10 Essential (primary) hypertension: Secondary | ICD-10-CM | POA: Insufficient documentation

## 2012-01-08 ENCOUNTER — Telehealth: Payer: Self-pay

## 2012-01-08 NOTE — Telephone Encounter (Signed)
No I would continue this medication as it is probably helping her palpitations

## 2012-01-08 NOTE — Telephone Encounter (Signed)
Pt aware.

## 2012-01-08 NOTE — Telephone Encounter (Signed)
Triage VM:  Pt called and states that she had a stress test done and found out it was normal.  Pt would like to know if she can stop her bystolic? Pls advise.

## 2012-01-22 ENCOUNTER — Encounter: Payer: Self-pay | Admitting: Gastroenterology

## 2012-01-22 ENCOUNTER — Ambulatory Visit (AMBULATORY_SURGERY_CENTER): Payer: Medicare Other | Admitting: *Deleted

## 2012-01-22 VITALS — Ht 70.0 in | Wt 218.4 lb

## 2012-01-22 DIAGNOSIS — Z1211 Encounter for screening for malignant neoplasm of colon: Secondary | ICD-10-CM

## 2012-01-22 DIAGNOSIS — Z800 Family history of malignant neoplasm of digestive organs: Secondary | ICD-10-CM

## 2012-01-22 MED ORDER — PEG-KCL-NACL-NASULF-NA ASC-C 100 G PO SOLR: ORAL | Status: DC

## 2012-01-25 ENCOUNTER — Ambulatory Visit (INDEPENDENT_AMBULATORY_CARE_PROVIDER_SITE_OTHER): Payer: Medicare Other | Admitting: Internal Medicine

## 2012-01-25 ENCOUNTER — Encounter: Payer: Self-pay | Admitting: Internal Medicine

## 2012-01-25 ENCOUNTER — Telehealth: Payer: Self-pay | Admitting: *Deleted

## 2012-01-25 VITALS — BP 130/72 | HR 76 | Temp 98.4°F | Wt 218.0 lb

## 2012-01-25 DIAGNOSIS — T148XXA Other injury of unspecified body region, initial encounter: Secondary | ICD-10-CM

## 2012-01-25 DIAGNOSIS — L259 Unspecified contact dermatitis, unspecified cause: Secondary | ICD-10-CM

## 2012-01-25 DIAGNOSIS — I10 Essential (primary) hypertension: Secondary | ICD-10-CM

## 2012-01-25 DIAGNOSIS — R002 Palpitations: Secondary | ICD-10-CM

## 2012-01-25 MED ORDER — SIMVASTATIN 20 MG PO TABS: 20.0000 mg | ORAL_TABLET | Freq: Every day | ORAL | Status: DC

## 2012-01-25 MED ORDER — TRIAMCINOLONE ACETONIDE 0.1 % EX CREA: TOPICAL_CREAM | Freq: Two times a day (BID) | CUTANEOUS | Status: DC

## 2012-01-25 MED ORDER — LOSARTAN POTASSIUM 25 MG PO TABS: 12.5000 mg | ORAL_TABLET | Freq: Every day | ORAL | Status: DC

## 2012-01-25 NOTE — Assessment & Plan Note (Signed)
Unexplained right lower abdominal bruising. Check CBC differential and INR.

## 2012-01-25 NOTE — Assessment & Plan Note (Addendum)
Patient reports palpitations have resolved. She is unable to tolerate 3 beta blockers secondary to side effect of diarrhea. Discontinue bystolic.  Patient advised to contact our office if palpitations return.

## 2012-01-25 NOTE — Telephone Encounter (Signed)
Left patient a message to call back. I need to get a lavender tube on her for CBC.

## 2012-01-25 NOTE — Patient Instructions (Signed)
Please complete the following lab tests before your next follow up appointment: BMET - 401.9 

## 2012-01-25 NOTE — Assessment & Plan Note (Signed)
Patient intolerant of HCTZ and beta blockers. Trial of low-dose losartan 12.5 mg once daily.

## 2012-01-25 NOTE — Progress Notes (Signed)
Subjective:    Patient ID: [name], female    DOB: [birth year], 71 y.o.   MRN: [id number]  HPI  71 year old white female with history of Sjogren's disease, hypertension and palpitations for followup. At previous visit, patient was switched from metoprolol to bystolic due to side effect of diarrhea. However despite changing medication patient continues to have loose stools after taking beta blocker. She reports her palpitations have resolved.    Review of Systems She has new rash on right lower abdomen.  She reports rash started after she discovered unexpected bruise in that area. She denies hx of trauma or injury No abdominal pain  Past Medical History  Diagnosis Date  . Hyperlipidemia   . Sjogren's disease   . Meningioma 11/2009    right post parietal 16mm - stable 11/2009 MRI  . Fibromyalgia   . GERD (gastroesophageal reflux disease)   . Hypertension   . Diverticulosis   . Arthritis     right thumb CMC arthritis  . Nephrolithiasis     History   Social History  . Marital Status: Divorced    Spouse Name: N/A    Number of Children: 2  . Years of Education: N/A   Occupational History  . Retired    Social History Main Topics  . Smoking status: Never Smoker   . Smokeless tobacco: Never Used  . Alcohol Use: Yes     Rarely  . Drug Use: No  . Sexually Active: Not on file   Other Topics Concern  . Not on file   Social History Narrative  . No narrative on file    Past Surgical History  Procedure Date  . Vaginal hysterectomy 1989  . Kidney stone surgery 1974  . Mass excision 2008  . Appendectomy 2008  . Tonsillectomy     Family History  Problem Relation Age of Onset  . Diabetes Neg Hx   . Stomach cancer Neg Hx   . Cancer Other     colon  . Colon cancer Mother 93  . Colon cancer Father     unknown when onset was    Allergies  Allergen Reactions  . Amoxicillin Rash  . Beta Adrenergic Blockers Diarrhea  . Clindamycin/Lincomycin Diarrhea and Rash     Current Outpatient Prescriptions on File Prior to Visit  Medication Sig Dispense Refill  . Calcium Carbonate-Vitamin D (CALCIUM + D) 600-200 MG-UNIT TABS Take 2 tablets by mouth daily.        . folic acid (FOLVITE) 1 MG tablet Take 1 mg by mouth daily.        . Glucosamine-Chondroit-Vit C-Mn (GLUCOSAMINE 1500 COMPLEX) CAPS Take 1,500 capsules by mouth 2 (two) times daily.        . Omega-3 Fatty Acids (FISH OIL) 1000 MG CAPS Take 1,000 capsules by mouth daily.        Marland Kitchen omeprazole (PRILOSEC) 20 MG capsule Take 1 capsule (20 mg total) by mouth daily. As needed for indigestion  90 capsule  3  . peg 3350 powder (MOVIPREP) 100 G SOLR moviprep as directed// no substitutions  1 kit  0  . vitamin B-12 (CYANOCOBALAMIN) 250 MCG tablet Take 250 mcg by mouth every other day.       Marland Kitchen DISCONTD: simvastatin (ZOCOR) 20 MG tablet Take 1 tablet (20 mg total) by mouth at bedtime.  30 tablet  3  . losartan (COZAAR) 25 MG tablet Take 0.5 tablets (12.5 mg total) by mouth daily.  30 tablet  1  BP 130/72  Pulse 76  Temp(Src) 98.4 F (36.9 C) (Oral)  Wt 218 lb (98.884 kg)       Objective:   Physical Exam  Constitutional: She is oriented to person, place, and time. She appears well-developed and well-nourished.  Cardiovascular: Normal rate, regular rhythm and normal heart sounds.   Pulmonary/Chest: Breath sounds normal. She has no wheezes.  Neurological: She is alert and oriented to person, place, and time.  Skin:       Right lower abdomen erythematous and excoriated. Small 2 x 3 cm bruise towards abdominal mid line          Assessment & Plan:

## 2012-01-25 NOTE — Assessment & Plan Note (Signed)
I suspect rash or right lower abdomen secondary contact dermatitis. Trial of triamcinolone cream twice daily times one to 2 weeks.

## 2012-01-28 ENCOUNTER — Telehealth: Payer: Self-pay | Admitting: Gastroenterology

## 2012-01-28 ENCOUNTER — Telehealth: Payer: Self-pay | Admitting: Internal Medicine

## 2012-01-28 LAB — CBC WITH DIFFERENTIAL/PLATELET
Basophils Relative: 0.4 % (ref 0.0–3.0)
Eosinophils Absolute: 0.2 10*3/uL (ref 0.0–0.7)
MCHC: 33.3 g/dL (ref 30.0–36.0)
MCV: 88.7 fl (ref 78.0–100.0)
Monocytes Absolute: 0.6 10*3/uL (ref 0.1–1.0)
Neutrophils Relative %: 73.2 % (ref 43.0–77.0)
Platelets: 208 10*3/uL (ref 150.0–400.0)
RBC: 4 Mil/uL (ref 3.87–5.11)

## 2012-01-28 MED ORDER — VALSARTAN 40 MG PO TABS: 40.0000 mg | ORAL_TABLET | Freq: Every day | ORAL | Status: DC

## 2012-01-28 NOTE — Telephone Encounter (Signed)
Pt aware.

## 2012-01-28 NOTE — Telephone Encounter (Signed)
Pt says she could not afford Moviprep.  Will mail rebate to pt.

## 2012-01-28 NOTE — Telephone Encounter (Signed)
Pt came by and said that Dr Artist Pais wanted pt to come back in for ov in July and also get bmet lab drawn 1 wk prior. Pls order req lab and advise.

## 2012-01-28 NOTE — Telephone Encounter (Signed)
Pt aware, rx sent in electronically, losartan added to allergy list as an intolerence

## 2012-01-28 NOTE — Telephone Encounter (Signed)
Pt can not take losartan (COZAAR) 25 MG tablet it knocks her out. Pt requesting to be contact

## 2012-01-28 NOTE — Telephone Encounter (Signed)
Please change to valsartan 40 mg  # 30 - one po qd  RF x 2

## 2012-02-04 ENCOUNTER — Encounter: Payer: Medicare Other | Admitting: Gastroenterology

## 2012-02-06 ENCOUNTER — Ambulatory Visit (AMBULATORY_SURGERY_CENTER): Payer: Medicare Other | Admitting: Gastroenterology

## 2012-02-06 ENCOUNTER — Encounter: Payer: Self-pay | Admitting: Gastroenterology

## 2012-02-06 VITALS — BP 143/80 | HR 79 | Temp 97.2°F | Resp 14 | Ht 70.0 in | Wt 218.0 lb

## 2012-02-06 DIAGNOSIS — Z1211 Encounter for screening for malignant neoplasm of colon: Secondary | ICD-10-CM

## 2012-02-06 DIAGNOSIS — Z800 Family history of malignant neoplasm of digestive organs: Secondary | ICD-10-CM

## 2012-02-06 MED ORDER — SODIUM CHLORIDE 0.9 % IV SOLN: 500.0000 mL | INTRAVENOUS | Status: DC

## 2012-02-06 NOTE — Patient Instructions (Addendum)
YOU HAD AN ENDOSCOPIC PROCEDURE TODAY AT THE [facility]: Refer to the procedure report that was given to you for any specific questions about what was found during the examination.  If the procedure report does not answer your questions, please call your gastroenterologist to clarify.  If you requested that your care partner not be given the details of your procedure findings, then the procedure report has been included in a sealed envelope for you to review at your convenience later.  YOU SHOULD EXPECT: Some feelings of bloating in the abdomen. Passage of more gas than usual.  Walking can help get rid of the air that was put into your GI tract during the procedure and reduce the bloating. If you had a lower endoscopy (such as a colonoscopy or flexible sigmoidoscopy) you may notice spotting of blood in your stool or on the toilet paper. If you underwent a bowel prep for your procedure, then you may not have a normal bowel movement for a few days.  DIET: Your first meal following the procedure should be a light meal and then it is ok to progress to your normal diet.  A half-sandwich or bowl of soup is an example of a good first meal.  Heavy or fried foods are harder to digest and may make you feel nauseous or bloated.  Likewise meals heavy in dairy and vegetables can cause extra gas to form and this can also increase the bloating.  Drink plenty of fluids but you should avoid alcoholic beverages for 24 hours.  ACTIVITY: Your care partner should take you home directly after the procedure.  You should plan to take it easy, moving slowly for the rest of the day.  You can resume normal activity the day after the procedure however you should NOT DRIVE or use heavy machinery for 24 hours (because of the sedation medicines used during the test).    SYMPTOMS TO REPORT IMMEDIATELY: A gastroenterologist can be reached at any hour.  During normal business hours, 8:30 AM to 5:00 PM Monday through Friday,  call [phone].  After hours and on weekends, please call the GI answering service at [phone] who will take a message and have the physician on call contact you.   Following lower endoscopy (colonoscopy or flexible sigmoidoscopy):  Excessive amounts of blood in the stool  Significant tenderness or worsening of abdominal pains  Swelling of the abdomen that is new, acute  Fever of 100F or higher  Following upper endoscopy (EGD)  Vomiting of blood or coffee ground material  New chest pain or pain under the shoulder blades  Painful or persistently difficult swallowing  New shortness of breath  Fever of 100F or higher  Black, tarry-looking stools  FOLLOW UP: If any biopsies were taken you will be contacted by phone or by letter within the next 1-3 weeks.  Call your gastroenterologist if you have not heard about the biopsies in 3 weeks.  Our staff will call the home number listed on your records the next business day following your procedure to check on you and address any questions or concerns that you may have at that time regarding the information given to you following your procedure. This is a courtesy call and so if there is no answer at the home number and we have not heard from you through the emergency physician on call, we will assume that you have returned to your regular daily activities without incident.  SIGNATURES/CONFIDENTIALITY: You and/or your care   partner have signed paperwork which will be entered into your electronic medical record.  These signatures attest to the fact that that the information above on your After Visit Summary has been reviewed and is understood.  Full responsibility of the confidentiality of this discharge information lies with you and/or your care-partner.  

## 2012-02-06 NOTE — Progress Notes (Signed)
Patient did not experience any of the following events: a burn prior to discharge; a fall within the facility; wrong site/side/patient/procedure/implant event; or a hospital transfer or hospital admission upon discharge from the facility. (G8907) Patient did not have preoperative order for IV antibiotic SSI prophylaxis. (G8918)  

## 2012-02-06 NOTE — Op Note (Signed)
[facility] [address] [place], [place]  [address]  COLONOSCOPY PROCEDURE REPORT  PATIENT:  Jillian, Moody  MR#:  [id number] BIRTHDATE:  Nov 01, 1940, 71 yrs. old  GENDER:  female ENDOSCOPIST:  Vania Rea. Jarold Motto, MD, [facility] REF. BY: PROCEDURE DATE:  02/06/2012 PROCEDURE:  Higher-risk screening colonoscopy G0105  ASA CLASS:  Class II INDICATIONS:  family history of colon cancer MEDICATIONS:   propofol (Diprivan) 150 mg IV  DESCRIPTION OF PROCEDURE:   After the risks and benefits and of the procedure were explained, informed consent was obtained. Digital rectal exam was performed and revealed no abnormalities. The LB [id number] endoscope was introduced through the anus and advanced to the cecum, which was identified by both the appendix and ileocecal valve.  The quality of the prep was excellent, using MoviPrep.  The instrument was then slowly withdrawn as the colon was fully examined. <<PROCEDUREIMAGES>>  FINDINGS:  There were mild diverticular changes in left colon. diverticulosis was found.  No polyps or cancers were seen.  This was otherwise a normal examination of the colon.   Retroflexed views in the rectum revealed no abnormalities.    The scope was then withdrawn from the patient and the procedure completed.  COMPLICATIONS:  None ENDOSCOPIC IMPRESSION: 1) Diverticulosis,mild,left sided diverticulosis 2) No polyps or cancers 3) Otherwise normal examination RECOMMENDATIONS: 1) Continue current medications 2) High fiber diet. 3) Given your significant family history of colon cancer, you should have a repeat colonoscopy in 5 years  REPEAT EXAM:  No  ______________________________ Vania Rea. Jarold Motto, MD, [facility]  CC:  Thomos Lemons, DO  n. eSIGNED:   Vania Rea. Ifeoma Vallin at 02/06/2012 10:18 AM  Haze Justin, [phone]

## 2012-02-07 ENCOUNTER — Telehealth: Payer: Self-pay

## 2012-02-07 NOTE — Telephone Encounter (Signed)
  Follow up Call-  Call back number 02/06/2012  Post procedure Call Back phone  # [phone]  Permission to leave phone message Yes     Patient questions:  Do you have a fever, pain , or abdominal swelling? no Pain Score  0 *  Have you tolerated food without any problems? yes  Have you been able to return to your normal activities? yes  Do you have any questions about your discharge instructions: Diet   no Medications  no Follow up visit  no  Do you have questions or concerns about your Care? no  Actions: * If pain score is 4 or above: No action needed, pain <4.

## 2012-02-27 ENCOUNTER — Other Ambulatory Visit (INDEPENDENT_AMBULATORY_CARE_PROVIDER_SITE_OTHER): Payer: Medicare Other

## 2012-02-27 DIAGNOSIS — I10 Essential (primary) hypertension: Secondary | ICD-10-CM

## 2012-02-27 LAB — BASIC METABOLIC PANEL
BUN: 10 mg/dL (ref 6–23)
Calcium: 9.8 mg/dL (ref 8.4–10.5)
GFR: 74.05 mL/min (ref >60.00)
Potassium: 4.5 mEq/L (ref 3.5–5.1)

## 2012-02-29 ENCOUNTER — Encounter: Payer: Self-pay | Admitting: Internal Medicine

## 2012-02-29 ENCOUNTER — Ambulatory Visit (INDEPENDENT_AMBULATORY_CARE_PROVIDER_SITE_OTHER): Payer: Medicare Other | Admitting: Internal Medicine

## 2012-02-29 VITALS — BP 152/70 | HR 81 | Temp 98.4°F | Wt 216.0 lb

## 2012-02-29 DIAGNOSIS — I10 Essential (primary) hypertension: Secondary | ICD-10-CM

## 2012-02-29 MED ORDER — VALSARTAN 40 MG PO TABS: 80.0000 mg | ORAL_TABLET | Freq: Every day | ORAL | Status: DC

## 2012-02-29 NOTE — Assessment & Plan Note (Signed)
The patient has had adverse reaction to multiple blood pressure medications. She could not tolerate losartan but is doing well on Diovan 40 mg once daily. Home blood pressure readings are well-controlled. She may have whitecoat hypertension. Continue Diovan. I encouraged regular exercise program. She is already working on weight loss efforts.  Electrolytes and kidney function stable.  BP: 152/70 mmHg  Lab Results  Component Value Date   CREATININE 0.8 02/27/2012

## 2012-02-29 NOTE — Progress Notes (Signed)
Subjective:    Patient ID: [name], female    DOB: 1941/02/01, 71 y.o.   MRN: [id number]  HPI  71 year old white female with hypertension for followup. Patient started on losartan but discontinued secondary to side effects. She is doing somewhat better on generic Diovan 40 mg once daily. Her home blood pressure readings have been much better. Systolic blood pressure ranges in the 110s and diastolic in the 60s to 70s.  She reports mild fatigue in the afternoon.  She has also started a walking program.  Review of Systems Mild weight loss  Past Medical History  Diagnosis Date  . Hyperlipidemia   . Sjogren's disease   . Meningioma 11/2009    right post parietal 16mm - stable 11/2009 MRI  . Fibromyalgia   . GERD (gastroesophageal reflux disease)   . Hypertension   . Diverticulosis   . Arthritis     right thumb CMC arthritis  . Nephrolithiasis     History   Social History  . Marital Status: Divorced    Spouse Name: N/A    Number of Children: 2  . Years of Education: N/A   Occupational History  . Retired    Social History Main Topics  . Smoking status: Never Smoker   . Smokeless tobacco: Never Used  . Alcohol Use: Yes     Rarely  . Drug Use: No  . Sexually Active: Not on file   Other Topics Concern  . Not on file   Social History Narrative  . No narrative on file    Past Surgical History  Procedure Date  . Vaginal hysterectomy 1989  . Kidney stone surgery 1974  . Mass excision 2008  . Appendectomy 2008  . Tonsillectomy     Family History  Problem Relation Age of Onset  . Diabetes Neg Hx   . Stomach cancer Neg Hx   . Cancer Other     colon  . Colon cancer Mother 29  . Colon cancer Father     unknown when onset was    Allergies  Allergen Reactions  . Losartan Other (See Comments)    Causes somnolence  . Amoxicillin Rash  . Beta Adrenergic Blockers Diarrhea  . Clindamycin/Lincomycin Diarrhea and Rash    Current Outpatient Prescriptions  on File Prior to Visit  Medication Sig Dispense Refill  . Calcium Carbonate-Vitamin D (CALCIUM + D) 600-200 MG-UNIT TABS Take 2 tablets by mouth daily.        . folic acid (FOLVITE) 1 MG tablet Take 1 mg by mouth daily.        . Glucosamine-Chondroit-Vit C-Mn (GLUCOSAMINE 1500 COMPLEX) CAPS Take 1,500 capsules by mouth 2 (two) times daily.        . Omega-3 Fatty Acids (FISH OIL) 1000 MG CAPS Take 1,000 capsules by mouth daily.        Marland Kitchen omeprazole (PRILOSEC) 20 MG capsule Take 1 capsule (20 mg total) by mouth daily. As needed for indigestion  90 capsule  3  . simvastatin (ZOCOR) 20 MG tablet Take 1 tablet (20 mg total) by mouth at bedtime.  90 tablet  1  . triamcinolone cream (KENALOG) 0.1 % Apply topically 2 (two) times daily.  30 g  0  . DISCONTD: valsartan (DIOVAN) 40 MG tablet Take 1 tablet (40 mg total) by mouth daily.  30 tablet  2   Current Facility-Administered Medications on File Prior to Visit  Medication Dose Route Frequency Provider Last Rate Last Dose  . DISCONTD:  0.9 %  sodium chloride infusion  500 mL Intravenous Continuous Mardella Layman, MD        BP 152/70  Pulse 81  Temp 98.4 F (36.9 C) (Oral)  Wt 216 lb (97.977 kg)       Objective:   Physical Exam  Constitutional: She is oriented to person, place, and time. She appears well-developed and well-nourished.  Cardiovascular: Normal rate, regular rhythm and normal heart sounds.   Pulmonary/Chest: Breath sounds normal. She has no wheezes.  Musculoskeletal: She exhibits no edema.  Neurological: She is alert and oriented to person, place, and time.  Psychiatric: She has a normal mood and affect. Her behavior is normal.          Assessment & Plan:

## 2012-03-03 ENCOUNTER — Telehealth: Payer: Self-pay | Admitting: Internal Medicine

## 2012-03-03 NOTE — Telephone Encounter (Signed)
OK to take 80 mg once daily

## 2012-03-03 NOTE — Telephone Encounter (Signed)
Caller: [name]/Patient; PCP: Artist Pais Doe-Hyun Molly Maduro); CB#: [phone]; Call regarding Seen 02/29/12, Diovan 80 Rx not available at pharmacy until 7/15. Her question is:  "  Can I go ahead and take the full dose?  I dont really want to halve that pill."  Per EMR:  order is for Diovan 40 mg tablets and she is to take 2 tablets daily - message to office for clarification.  She has made appointment for 9/13 follow up. Does she need to have blood drawn/ labs prior to appointment?  Per Medication Questions protocol and PCP Calls protocol, note to office for follow up.

## 2012-03-03 NOTE — Telephone Encounter (Signed)
Dr Artist Pais is out of the office this week.  Could you answer?

## 2012-03-03 NOTE — Telephone Encounter (Signed)
Pt aware.

## 2012-03-05 ENCOUNTER — Ambulatory Visit: Payer: Medicare Other | Admitting: Internal Medicine

## 2012-03-13 ENCOUNTER — Encounter: Payer: Self-pay | Admitting: Cardiology

## 2012-03-13 ENCOUNTER — Ambulatory Visit (INDEPENDENT_AMBULATORY_CARE_PROVIDER_SITE_OTHER): Payer: Medicare Other | Admitting: Cardiology

## 2012-03-13 VITALS — BP 146/76 | HR 82 | Ht 70.0 in | Wt 215.0 lb

## 2012-03-13 DIAGNOSIS — I10 Essential (primary) hypertension: Secondary | ICD-10-CM

## 2012-03-13 DIAGNOSIS — R0789 Other chest pain: Secondary | ICD-10-CM

## 2012-03-13 DIAGNOSIS — R002 Palpitations: Secondary | ICD-10-CM

## 2012-03-13 DIAGNOSIS — E785 Hyperlipidemia, unspecified: Secondary | ICD-10-CM

## 2012-03-13 NOTE — Assessment & Plan Note (Signed)
Blood pressure controlled. She states it is lower at home. She does not want to continue on Diovan. She will discontinue this when her prescription expires. She will follow her blood pressure off of this medication and discuss this further with Dr. Artist Pais. If her blood pressure increases I have asked her to resume.

## 2012-03-13 NOTE — Assessment & Plan Note (Signed)
Stress echocardiogram normal. No further cardiac evaluation.

## 2012-03-13 NOTE — Assessment & Plan Note (Signed)
Management per primary care. 

## 2012-03-13 NOTE — Progress Notes (Signed)
   HPI: 71 year-old female with no prior cardiac history for evaluation of chest pain and palpitations. Stress echocardiogram in May 2013 was normal. TSH in March of 2013 was normal. Note the patient did not tolerate Toprol previously because of fatigue. We did add atenolol at last visit. Since I last saw her in May of 2013, she has some dyspnea on exertion but no recurrent chest pain or palpitations. She did not tolerate atenolol. She is describing increased sweating with Diovan.   Current Outpatient Prescriptions  Medication Sig Dispense Refill  . Calcium Carbonate-Vitamin D (CALCIUM + D) 600-200 MG-UNIT TABS Take 2 tablets by mouth daily.        . folic acid (FOLVITE) 1 MG tablet Take 1 mg by mouth daily.        . Glucosamine-Chondroit-Vit C-Mn (GLUCOSAMINE 1500 COMPLEX) CAPS Take 1,500 capsules by mouth 2 (two) times daily.        . Omega-3 Fatty Acids (FISH OIL) 1000 MG CAPS Take 1,000 capsules by mouth daily.        Marland Kitchen omeprazole (PRILOSEC) 20 MG capsule Take 1 capsule (20 mg total) by mouth daily. As needed for indigestion  90 capsule  3  . simvastatin (ZOCOR) 20 MG tablet Take 1 tablet (20 mg total) by mouth at bedtime.  90 tablet  1  . triamcinolone cream (KENALOG) 0.1 % Apply topically 2 (two) times daily.  30 g  0  . valsartan (DIOVAN) 40 MG tablet Take 40 mg by mouth daily.      Marland Kitchen DISCONTD: valsartan (DIOVAN) 40 MG tablet Take 2 tablets (80 mg total) by mouth daily.  30 tablet  2     Past Medical History  Diagnosis Date  . Hyperlipidemia   . Sjogren's disease   . Meningioma 11/2009    right post parietal 16mm - stable 11/2009 MRI  . Fibromyalgia   . GERD (gastroesophageal reflux disease)   . Hypertension   . Diverticulosis   . Arthritis     right thumb CMC arthritis  . Nephrolithiasis     Past Surgical History  Procedure Date  . Vaginal hysterectomy 1989  . Kidney stone surgery 1974  . Mass excision 2008  . Appendectomy 2008  . Tonsillectomy     History   Social  History  . Marital Status: Divorced    Spouse Name: N/A    Number of Children: 2  . Years of Education: N/A   Occupational History  . Retired    Social History Main Topics  . Smoking status: Never Smoker   . Smokeless tobacco: Never Used  . Alcohol Use: Yes     Rarely  . Drug Use: No  . Sexually Active: Not on file   Other Topics Concern  . Not on file   Social History Narrative  . No narrative on file    ROS: problems with pain from fibromyalgia but no fevers or chills, productive cough, hemoptysis, dysphasia, odynophagia, melena, hematochezia, dysuria, hematuria, rash, seizure activity, orthopnea, PND, pedal edema, claudication. Remaining systems are negative.  Physical Exam: Well-developed well-nourished in no acute distress.  Skin is warm and dry.  HEENT is normal.  Neck is supple.  Chest is clear to auscultation with normal expansion.  Cardiovascular exam is regular rate and rhythm.  Abdominal exam nontender or distended. No masses palpated. Extremities show no edema. neuro grossly intact  ECG sinus rhythm at a rate of 82. No ST changes.

## 2012-03-13 NOTE — Patient Instructions (Addendum)
Your physician recommends that you schedule a follow-up WITH DR Artist Pais REGARDING BLOOD PRESSURE  STOP DIOVAN WHEN CURRENT SCRIPT IS COMPLETE  Your physician recommends that you schedule a follow-up appointment in: AS NEEDED

## 2012-03-13 NOTE — Assessment & Plan Note (Signed)
Symptoms have resolved. She did not tolerate beta blockade. No further evaluation at this time.

## 2012-05-01 ENCOUNTER — Other Ambulatory Visit (INDEPENDENT_AMBULATORY_CARE_PROVIDER_SITE_OTHER): Payer: Medicare Other

## 2012-05-01 DIAGNOSIS — E785 Hyperlipidemia, unspecified: Secondary | ICD-10-CM

## 2012-05-01 DIAGNOSIS — Z79899 Other long term (current) drug therapy: Secondary | ICD-10-CM

## 2012-05-01 DIAGNOSIS — Z0000 Encounter for general adult medical examination without abnormal findings: Secondary | ICD-10-CM

## 2012-05-01 LAB — TSH: TSH: 1.83 u[IU]/mL (ref 0.35–5.50)

## 2012-05-01 LAB — POCT URINALYSIS DIPSTICK
Leukocytes, UA: NEGATIVE
Protein, UA: NEGATIVE
Urobilinogen, UA: 0.2
pH, UA: 7.5

## 2012-05-01 LAB — CBC WITH DIFFERENTIAL/PLATELET
Basophils Relative: 0.3 % (ref 0.0–3.0)
Eosinophils Relative: 3.5 % (ref 0.0–5.0)
Hemoglobin: 11.6 g/dL — ABNORMAL LOW (ref 12.0–15.0)
Lymphocytes Relative: 13.5 % (ref 12.0–46.0)
MCHC: 33 g/dL (ref 30.0–36.0)
Monocytes Relative: 12.8 % — ABNORMAL HIGH (ref 3.0–12.0)
Neutro Abs: 4 10*3/uL (ref 1.4–7.7)
RBC: 3.9 Mil/uL (ref 3.87–5.11)
WBC: 5.7 10*3/uL (ref 4.5–10.5)

## 2012-05-01 LAB — LIPID PANEL
HDL: 45 mg/dL (ref >39.00)
Total CHOL/HDL Ratio: 5
VLDL: 40.8 mg/dL — ABNORMAL HIGH (ref 0.0–40.0)

## 2012-05-01 LAB — BASIC METABOLIC PANEL
CO2: 23 mEq/L (ref 19–32)
Calcium: 9.4 mg/dL (ref 8.4–10.5)
GFR: 93.75 mL/min (ref >60.00)
Sodium: 141 mEq/L (ref 135–145)

## 2012-05-01 LAB — HEPATIC FUNCTION PANEL
AST: 26 U/L (ref 0–37)
Albumin: 3.8 g/dL (ref 3.5–5.2)
Total Protein: 7.7 g/dL (ref 6.0–8.3)

## 2012-05-02 ENCOUNTER — Ambulatory Visit: Payer: Medicare Other | Admitting: Internal Medicine

## 2012-05-02 LAB — LDL CHOLESTEROL, DIRECT: Direct LDL: 144 mg/dL

## 2012-05-15 ENCOUNTER — Ambulatory Visit (INDEPENDENT_AMBULATORY_CARE_PROVIDER_SITE_OTHER): Payer: Medicare Other | Admitting: Internal Medicine

## 2012-05-15 ENCOUNTER — Encounter: Payer: Self-pay | Admitting: Internal Medicine

## 2012-05-15 VITALS — BP 142/72 | HR 96 | Temp 98.4°F | Ht 69.0 in | Wt 216.0 lb

## 2012-05-15 DIAGNOSIS — K529 Noninfective gastroenteritis and colitis, unspecified: Secondary | ICD-10-CM

## 2012-05-15 DIAGNOSIS — Z0000 Encounter for general adult medical examination without abnormal findings: Secondary | ICD-10-CM

## 2012-05-15 DIAGNOSIS — R197 Diarrhea, unspecified: Secondary | ICD-10-CM

## 2012-05-15 DIAGNOSIS — I10 Essential (primary) hypertension: Secondary | ICD-10-CM

## 2012-05-15 DIAGNOSIS — Z23 Encounter for immunization: Secondary | ICD-10-CM

## 2012-05-15 NOTE — Assessment & Plan Note (Signed)
Performed medical annual wellness exam. See attached Medicare annual wellness physical questionnaire.

## 2012-05-15 NOTE — Assessment & Plan Note (Signed)
Patient discontinued the Diovan. Patient reports it causes her to feel sweaty and fatigued. She monitors her blood pressure at home. Systolic blood pressure readings range from 130's to 150's. She declines use of blood pressure medications for now. Patient advised to continue to monitor her blood pressure at home. She is to notify our office if further with any significant changes.

## 2012-05-15 NOTE — Progress Notes (Signed)
Subjective:    Patient ID: [name], female    DOB: 24-Nov-1940, 71 y.o.   MRN: [id number]  HPI  71 year old white female with history of hypertension, atypical chest pain, Sjogren syndrome for routine Medicare physical. Medicare annual wellness physical questionnaire reviewed in detail. Patient is performing activities of daily living without any difficulty. She is does not drink any alcohol and does not smoke. She is overweight but is actively trying to exercise and decrease her caloric intake. She has no history of falls and denies any hearing deficit. She also does not have any history of depression and denies depressive symptoms.  Preventative medical services review performed.  Cognitive testing performed.  Patient complains of chronic intermittent loose stools ever since her colonoscopy this is been going on for several months. She denies blood in her stools. She denies abdominal pain.  Review of Systems   Constitutional: Negative for activity change, appetite change and unexpected weight change.  Eyes: Negative for visual disturbance.  Respiratory: Negative for cough, chest tightness and shortness of breath.   Cardiovascular: Negative for chest pain.  Genitourinary: Negative for difficulty urinating.  Neurological: Negative for headaches.  Gastrointestinal: positive for abdominal pain Psych: Negative for depression or anxiety  Past Medical History  Diagnosis Date  . Hyperlipidemia   . Sjogren's disease   . Meningioma 11/2009    right post parietal 16mm - stable 11/2009 MRI  . Fibromyalgia   . GERD (gastroesophageal reflux disease)   . Hypertension   . Diverticulosis   . Arthritis     right thumb CMC arthritis  . Nephrolithiasis     History   Social History  . Marital Status: Divorced    Spouse Name: N/A    Number of Children: 2  . Years of Education: N/A   Occupational History  . Retired    Social History Main Topics  . Smoking status: Never Smoker     . Smokeless tobacco: Never Used  . Alcohol Use: Yes     Rarely  . Drug Use: No  . Sexually Active: Not on file   Other Topics Concern  . Not on file   Social History Narrative  . No narrative on file    Past Surgical History  Procedure Date  . Vaginal hysterectomy 1989  . Kidney stone surgery 1974  . Mass excision 2008  . Appendectomy 2008  . Tonsillectomy     Family History  Problem Relation Age of Onset  . Diabetes Neg Hx   . Stomach cancer Neg Hx   . Cancer Other     colon  . Colon cancer Mother 11  . Colon cancer Father     unknown when onset was    Allergies  Allergen Reactions  . Losartan Other (See Comments)    Causes somnolence  . Amoxicillin Rash  . Beta Adrenergic Blockers Diarrhea  . Clindamycin/Lincomycin Diarrhea and Rash    Current Outpatient Prescriptions on File Prior to Visit  Medication Sig Dispense Refill  . Calcium Carbonate-Vitamin D (CALCIUM + D) 600-200 MG-UNIT TABS Take 2 tablets by mouth daily.        . folic acid (FOLVITE) 1 MG tablet Take 1 mg by mouth daily.        . Glucosamine-Chondroit-Vit C-Mn (GLUCOSAMINE 1500 COMPLEX) CAPS Take 1,500 capsules by mouth 2 (two) times daily.        . Omega-3 Fatty Acids (FISH OIL) 1000 MG CAPS Take 1,000 capsules by mouth daily.        Marland Kitchen  omeprazole (PRILOSEC) 20 MG capsule Take 1 capsule (20 mg total) by mouth daily. As needed for indigestion  90 capsule  3  . simvastatin (ZOCOR) 20 MG tablet Take 1 tablet (20 mg total) by mouth at bedtime.  90 tablet  1    BP 142/72  Pulse 96  Temp 98.4 F (36.9 C) (Oral)  Ht 5\' 9"  (1.753 m)  Wt 216 lb (97.977 kg)  BMI 31.90 kg/m2           Objective:   Physical Exam  Constitutional: She is oriented to person, place, and time. She appears well-developed and well-nourished. No distress.  HENT:  Head: Normocephalic and atraumatic.  Right Ear: External ear normal.  Left Ear: External ear normal.  Mouth/Throat: Oropharynx is clear and moist. No  oropharyngeal exudate.       good dentition  Cardiovascular: Normal rate, regular rhythm and normal heart sounds.   Pulmonary/Chest: Effort normal and breath sounds normal. She has no wheezes.  Abdominal: Soft. Bowel sounds are normal. There is no tenderness.  Musculoskeletal: She exhibits no edema.  Neurological: She is alert and oriented to person, place, and time. No cranial nerve deficit.  Skin: Skin is warm and dry.  Psychiatric: She has a normal mood and affect. Her behavior is normal.          Assessment & Plan:

## 2012-05-15 NOTE — Patient Instructions (Addendum)
Continue to use imodium over the counter as needed for loose stools Our office will contact you re: stool test results Please make a follow up appointment within 2 weeks if you are still experiencing loose stools

## 2012-05-15 NOTE — Assessment & Plan Note (Signed)
71 year old experiencing intermittent loose stools ever since her colonoscopy in June of 2013. Obtain C. Difficile by PCR. Also check stool lactoferrin. Use Imodium over-the-counter for now.

## 2012-05-17 LAB — FECAL LACTOFERRIN, QUANT: Lactoferrin: POSITIVE

## 2012-05-20 ENCOUNTER — Other Ambulatory Visit: Payer: Self-pay | Admitting: *Deleted

## 2012-05-20 MED ORDER — METRONIDAZOLE 250 MG PO TABS: 250.0000 mg | ORAL_TABLET | Freq: Three times a day (TID) | ORAL | Status: DC

## 2012-08-14 ENCOUNTER — Other Ambulatory Visit: Payer: Self-pay | Admitting: *Deleted

## 2012-08-14 MED ORDER — SIMVASTATIN 20 MG PO TABS: 20.0000 mg | ORAL_TABLET | Freq: Every day | ORAL | Status: DC

## 2012-10-23 ENCOUNTER — Other Ambulatory Visit: Payer: Self-pay

## 2012-10-23 DIAGNOSIS — Z1231 Encounter for screening mammogram for malignant neoplasm of breast: Secondary | ICD-10-CM

## 2013-01-02 ENCOUNTER — Ambulatory Visit
Admission: RE | Admit: 2013-01-02 | Discharge: 2013-01-02 | Disposition: A | Discharge status: Home / Self Care | Payer: Medicare Other | Source: Ambulatory Visit

## 2013-01-02 DIAGNOSIS — Z1231 Encounter for screening mammogram for malignant neoplasm of breast: Secondary | ICD-10-CM

## 2013-01-15 ENCOUNTER — Encounter: Payer: Self-pay | Admitting: Internal Medicine

## 2013-01-15 ENCOUNTER — Ambulatory Visit (INDEPENDENT_AMBULATORY_CARE_PROVIDER_SITE_OTHER): Payer: Medicare Other | Admitting: Internal Medicine

## 2013-01-15 VITALS — BP 152/74 | Temp 98.1°F | Wt 221.0 lb

## 2013-01-15 DIAGNOSIS — R51 Headache: Secondary | ICD-10-CM

## 2013-01-15 DIAGNOSIS — R519 Headache, unspecified: Secondary | ICD-10-CM | POA: Insufficient documentation

## 2013-01-15 LAB — SEDIMENTATION RATE: Sed Rate: 55 mm/hr — ABNORMAL HIGH (ref 0–22)

## 2013-01-15 MED ORDER — TRAMADOL HCL 50 MG PO TABS: 50.0000 mg | ORAL_TABLET | Freq: Three times a day (TID) | ORAL | Status: DC | PRN

## 2013-01-15 MED ORDER — ALPRAZOLAM 0.25 MG PO TABS: ORAL_TABLET | ORAL | Status: DC

## 2013-01-15 NOTE — Patient Instructions (Signed)
Try taking 1 tablet of OTC aleve when you experience left sided head pressure Use alprazolam 0.25 mg as directed

## 2013-01-15 NOTE — Progress Notes (Signed)
Subjective:    Patient ID: [name], female    DOB: 1940-12-19, 72 y.o.   MRN: [id number]  HPI  72 year old white female with history of Sjogren's syndrome, hypertension and fibromyalgia for followup regarding headaches. She was seen by Dr. Venetia Maxon for followup of her right parietal meningioma in Feb, 2014. She complained at the time of worsening pressure-like sensation in her left temporal area. Dr. Venetia Maxon raised concern of possible temporal arteritis and she was referred to Dr. Dareen Piano for further evaluation. Patient has been having her symptoms over last 3 years. However it has been increasing in frequency. Her symptoms usually last approximately 30 minutes. She does not have any associated fatigue, fevers weight loss or change in vision. She had recent funduscopic exam by Dr. Charlotte Sanes. Her eye exam was unremarkable. Patient reports twitching of her right lower eye lid over last several days.  She recently returned from trip to [place]. Since returning from her trip, her left-sided temporal pressures have occurred almost daily. She does have describe the sensation as painful. She stops what she is doing not because of debilitating pain but because of odd sensation.  Review of Systems Negative for jaw claudication,  Right eye muscles twitching but no visual changes,  Upper shoulder discomfort that is chronic    Past Medical History  Diagnosis Date  . Hyperlipidemia   . Sjogren's disease   . Meningioma 11/2009    right post parietal 16mm - stable 11/2009 MRI  . Fibromyalgia   . GERD (gastroesophageal reflux disease)   . Hypertension   . Diverticulosis   . Arthritis     right thumb CMC arthritis  . Nephrolithiasis     History   Social History  . Marital Status: Divorced    Spouse Name: N/A    Number of Children: 2  . Years of Education: N/A   Occupational History  . Retired    Social History Main Topics  . Smoking status: Never Smoker   . Smokeless tobacco: Never Used  .  Alcohol Use: Yes     Comment: Rarely  . Drug Use: No  . Sexually Active: Not on file   Other Topics Concern  . Not on file   Social History Narrative  . No narrative on file    Past Surgical History  Procedure Laterality Date  . Vaginal hysterectomy  1989  . Kidney stone surgery  1974  . Mass excision  2008  . Appendectomy  2008  . Tonsillectomy      Family History  Problem Relation Age of Onset  . Diabetes Neg Hx   . Stomach cancer Neg Hx   . Cancer Other     colon  . Colon cancer Mother 56  . Colon cancer Father     unknown when onset was    Allergies  Allergen Reactions  . Losartan Other (See Comments)    Causes somnolence  . Amoxicillin Rash  . Beta Adrenergic Blockers Diarrhea  . Clindamycin/Lincomycin Diarrhea and Rash    Current Outpatient Prescriptions on File Prior to Visit  Medication Sig Dispense Refill  . Calcium Carbonate-Vitamin D (CALCIUM + D) 600-200 MG-UNIT TABS Take 2 tablets by mouth daily.        . folic acid (FOLVITE) 1 MG tablet Take 1 mg by mouth daily.        . Glucosamine-Chondroit-Vit C-Mn (GLUCOSAMINE 1500 COMPLEX) CAPS Take 1,500 capsules by mouth 2 (two) times daily.        Marland Kitchen  metroNIDAZOLE (FLAGYL) 250 MG tablet Take 1 tablet (250 mg total) by mouth 3 (three) times daily.  27 tablet  0  . Omega-3 Fatty Acids (FISH OIL) 1000 MG CAPS Take 1,000 capsules by mouth daily.        Marland Kitchen omeprazole (PRILOSEC) 20 MG capsule Take 1 capsule (20 mg total) by mouth daily. As needed for indigestion  90 capsule  3  . simvastatin (ZOCOR) 20 MG tablet Take 1 tablet (20 mg total) by mouth at bedtime.  90 tablet  1   No current facility-administered medications on file prior to visit.    BP 152/74  Temp(Src) 98.1 F (36.7 C) (Oral)  Wt 221 lb (100.245 kg)  BMI 32.62 kg/m2    Objective:   Physical Exam  Constitutional: She is oriented to person, place, and time. She appears well-developed and well-nourished.  HENT:  Head: Normocephalic and  atraumatic.  Right Ear: External ear normal.  Left Ear: External ear normal.  No tenderness of scalp or temporal area (left scalp)  Cardiovascular: Normal rate, regular rhythm and normal heart sounds.   Pulmonary/Chest: Effort normal and breath sounds normal. She has no wheezes.  Musculoskeletal:  No muscle tenderness (shoulders or hips)  Neurological: She is alert and oriented to person, place, and time. No cranial nerve deficit.  Psychiatric: She has a normal mood and affect. Her behavior is normal.          Assessment & Plan:

## 2013-01-15 NOTE — Assessment & Plan Note (Addendum)
72 year old white female with unexplained pressure-like sensation in her left parietal area. I doubt her symptoms related to temporal arteritis. Her change in symptoms were discussed with Dr. Dareen Piano.  Plan is to repeat sedimentation rate and CRP. Repeat MRI of brain with and without contrast. We will ask radiologist also to pay particular attention to left temporal artery. Use over-the-counter NSAIDs/Aleve as needed for now. Patient has been anxious due to unexplained symptoms. Trial of low-dose alprazolam. Refer to neurology for further evaluation left parietal pressure sensation.  Addendum -  Lab Results  Component Value Date   ESRSEDRATE 55* 01/15/2013   CRP - 3.8.    I forwarded lab results to Dr. Dareen Piano.  He recommends trial of prednisone.  Patient advised to take 60 mg once daily.  She will contact our office in 3-4 days with her response.  If positive response, she will follow up with Dr. Dareen Piano who will handle tapering her dose.

## 2013-01-16 ENCOUNTER — Telehealth: Payer: Self-pay | Admitting: Internal Medicine

## 2013-01-16 NOTE — Telephone Encounter (Signed)
[facility] Triage Call Report Triage Record Num: [id number] Operator: Albertine Grates Patient Name: [name] Call Date & Time: 01/15/2013 9:11:54PM Patient Phone: [phone] PCP: Thomos Lemons Patient Gender: Female PCP Fax : [phone] Patient DOB: [birth year] [facility] Name: [facility] Reason for Call: Caller: Delaney Meigs; PCP: Artist Pais Doe-Hyun Molly Maduro) (Adults only); CB#: [phone]; [facility] calling and states patient had stat sed rate and CRP drawn 5-29. Sed rate/55-high and CRP/3.8-high. MD not notified. Protocol(s) Used: Office Note Recommended Outcome per Protocol: Information Noted and Sent to Office Reason for Outcome: Caller information to office Care Advice: ~ 05/

## 2013-01-19 MED ORDER — PREDNISONE 20 MG PO TABS: ORAL_TABLET | ORAL | Status: DC

## 2013-01-19 NOTE — Addendum Note (Signed)
Addended by: Meda Coffee on: 01/19/2013 05:22 PM   Modules accepted: Orders

## 2013-01-23 ENCOUNTER — Other Ambulatory Visit: Payer: Medicare Other

## 2013-01-23 ENCOUNTER — Ambulatory Visit (INDEPENDENT_AMBULATORY_CARE_PROVIDER_SITE_OTHER): Payer: Medicare Other | Admitting: Internal Medicine

## 2013-01-23 ENCOUNTER — Encounter: Payer: Self-pay | Admitting: Internal Medicine

## 2013-01-23 VITALS — BP 174/84 | Wt 221.0 lb

## 2013-01-23 DIAGNOSIS — R7309 Other abnormal glucose: Secondary | ICD-10-CM

## 2013-01-23 DIAGNOSIS — R739 Hyperglycemia, unspecified: Secondary | ICD-10-CM

## 2013-01-23 DIAGNOSIS — R51 Headache: Secondary | ICD-10-CM

## 2013-01-23 LAB — GLUCOSE, POCT (MANUAL RESULT ENTRY): POC Glucose: 207 mg/dl — AB (ref 70–99)

## 2013-01-23 MED ORDER — PREDNISONE 20 MG PO TABS: 40.0000 mg | ORAL_TABLET | Freq: Every day | ORAL | Status: DC

## 2013-01-23 NOTE — Assessment & Plan Note (Signed)
Patient's random blood sugar was 207. Patient given glucometer.  She was advised to check her blood sugar every morning (fasting). If significant upward trend, we discussed using insulin therapy short-term.  Her blood sugars should improve with tapering prednisone.

## 2013-01-23 NOTE — Patient Instructions (Addendum)
Start prednisone 40 mg on Monday.   Our office will contact you re: follow up with Dr. Dareen Piano

## 2013-01-23 NOTE — Progress Notes (Signed)
Subjective:    Patient ID: [name], female    DOB: 09-02-1940, 72 y.o.   MRN: [id number]  HPI  72 year old white female with history of Sjogren's syndrome previously seen for left-sided temporal pressure for followup. Patient's sedimentation rate was repeated. Sed rate was elevated at 55. Clinical scenario was discussed with her rheumatologist-Dr. Dareen Piano. Despite low suspicion for temporal arteritis, he wanted empiric trial of prednisone. Patient was started on prednisone 60 mg starting on Monday. She is taking medication for 4-5 days. She reports her left sided pressure sensation has significantly improved. She had mild episode this morning and early afternoon.  She complains of loose stools.  She is not sure whether this is related to prednisone use.  Her blood sugar in the office was 207.  Review of Systems Negative for polyuria or polydipsia  Past Medical History  Diagnosis Date  . Hyperlipidemia   . Sjogren's disease   . Meningioma 11/2009    right post parietal 16mm - stable 11/2009 MRI  . Fibromyalgia   . GERD (gastroesophageal reflux disease)   . Hypertension   . Diverticulosis   . Arthritis     right thumb [facility] arthritis  . Nephrolithiasis     History   Social History  . Marital Status: Divorced    Spouse Name: N/A    Number of Children: 2  . Years of Education: N/A   Occupational History  . Retired    Social History Main Topics  . Smoking status: Never Smoker   . Smokeless tobacco: Never Used  . Alcohol Use: Yes     Comment: Rarely  . Drug Use: No  . Sexually Active: Not on file   Other Topics Concern  . Not on file   Social History Narrative  . No narrative on file    Past Surgical History  Procedure Laterality Date  . Vaginal hysterectomy  1989  . Kidney stone surgery  1974  . Mass excision  2008  . Appendectomy  2008  . Tonsillectomy      Family History  Problem Relation Age of Onset  . Diabetes Neg Hx   . Stomach cancer Neg Hx    . Cancer Other     colon  . Colon cancer Mother 38  . Colon cancer Father     unknown when onset was    Allergies  Allergen Reactions  . Losartan Other (See Comments)    Causes somnolence  . Amoxicillin Rash  . Beta Adrenergic Blockers Diarrhea  . Clindamycin/Lincomycin Diarrhea and Rash    Current Outpatient Prescriptions on File Prior to Visit  Medication Sig Dispense Refill  . ALPRAZolam (XANAX) 0.25 MG tablet 1/2 to one tablet twice daily as needed  30 tablet  0  . Calcium Carbonate-Vitamin D (CALCIUM + D) 600-200 MG-UNIT TABS Take 2 tablets by mouth daily.        . folic acid (FOLVITE) 1 MG tablet Take 1 mg by mouth daily.        . Glucosamine-Chondroit-Vit C-Mn (GLUCOSAMINE 1500 COMPLEX) CAPS Take 1,500 capsules by mouth 2 (two) times daily.        . metroNIDAZOLE (FLAGYL) 250 MG tablet Take 1 tablet (250 mg total) by mouth 3 (three) times daily.  27 tablet  0  . Omega-3 Fatty Acids (FISH OIL) 1000 MG CAPS Take 1,000 capsules by mouth daily.        Marland Kitchen omeprazole (PRILOSEC) 20 MG capsule Take 1 capsule (20 mg total) by mouth  daily. As needed for indigestion  90 capsule  3  . simvastatin (ZOCOR) 20 MG tablet Take 1 tablet (20 mg total) by mouth at bedtime.  90 tablet  1  . traMADol (ULTRAM) 50 MG tablet Take 1 tablet (50 mg total) by mouth every 8 (eight) hours as needed for pain.  30 tablet  0   No current facility-administered medications on file prior to visit.    BP 174/84  Wt 221 lb (100.245 kg)  BMI 32.62 kg/m2       Objective:   Physical Exam  Constitutional: She is oriented to person, place, and time. She appears well-developed and well-nourished.  Cardiovascular: Normal rate, regular rhythm and normal heart sounds.   Pulmonary/Chest: Effort normal and breath sounds normal. She has no wheezes.  Neurological: She is alert and oriented to person, place, and time.  Psychiatric: She has a normal mood and affect. Her behavior is normal.          Assessment &  Plan:

## 2013-01-23 NOTE — Assessment & Plan Note (Addendum)
Patient sed rate was discussed with Dr. Dareen Piano last Friday.  He suggested empiric trial of prednisone 60 mg.  Patient has been on prednisone 60 mg for 4 days.  She reports left side headache sensation significantly reduced but not resolved.  Since she has had positive response, arrange follow up with Dr. Dareen Piano who will manage tapering prednisone.  Patient to start 40 mg on Monday.  Lab Results  Component Value Date   ESRSEDRATE 55* 01/15/2013

## 2013-01-28 ENCOUNTER — Ambulatory Visit
Admission: RE | Admit: 2013-01-28 | Discharge: 2013-01-28 | Disposition: A | Discharge status: Home / Self Care | Payer: Medicare Other | Source: Ambulatory Visit | Attending: Internal Medicine | Admitting: Internal Medicine

## 2013-01-29 ENCOUNTER — Ambulatory Visit: Payer: Medicare Other | Admitting: Internal Medicine

## 2013-01-29 ENCOUNTER — Telehealth: Payer: Self-pay | Admitting: Internal Medicine

## 2013-01-29 NOTE — Telephone Encounter (Signed)
Per dr yoo-he would like her to do what dr Dareen Piano suggests

## 2013-01-29 NOTE — Telephone Encounter (Signed)
Pt is supposed to get a biopsy TOMORROW. Pt states she really needs to speak w/ Dr Artist Pais about this. Pt was on line for triage nurse for 20 min and hung up. Pls call.

## 2013-01-29 NOTE — Progress Notes (Signed)
Answered questions.  

## 2013-01-30 ENCOUNTER — Encounter (HOSPITAL_BASED_OUTPATIENT_CLINIC_OR_DEPARTMENT_OTHER): Payer: Self-pay | Admitting: *Deleted

## 2013-01-30 NOTE — Progress Notes (Signed)
Pt states she is not nervous but talking very fast, upset about this procedure-upset she has to have someone bring her and stay with her post op-refuses to take any meds to calm her. She will come in for bmet-had a cardiac work up 7/13 was negative-ekg 7/13-has been on prednisone-she has shograns and has chronic dry mouth-

## 2013-02-02 ENCOUNTER — Encounter: Payer: Self-pay | Admitting: Diagnostic Neuroimaging

## 2013-02-02 ENCOUNTER — Ambulatory Visit (INDEPENDENT_AMBULATORY_CARE_PROVIDER_SITE_OTHER): Payer: Medicare Other | Admitting: Diagnostic Neuroimaging

## 2013-02-02 ENCOUNTER — Encounter (HOSPITAL_BASED_OUTPATIENT_CLINIC_OR_DEPARTMENT_OTHER)
Admission: RE | Admit: 2013-02-02 | Discharge: 2013-02-02 | Disposition: A | Discharge status: Home / Self Care | Payer: Medicare Other | Source: Ambulatory Visit | Attending: Otolaryngology | Admitting: Otolaryngology

## 2013-02-02 VITALS — BP 149/79 | HR 79 | Ht 69.0 in | Wt 220.0 lb

## 2013-02-02 DIAGNOSIS — M316 Other giant cell arteritis: Secondary | ICD-10-CM

## 2013-02-02 DIAGNOSIS — R51 Headache: Secondary | ICD-10-CM

## 2013-02-02 LAB — BASIC METABOLIC PANEL
Calcium: 9.3 mg/dL (ref 8.4–10.5)
GFR calc Af Amer: >90 mL/min (ref >90)
GFR calc non Af Amer: 84 mL/min — ABNORMAL LOW (ref >90)
Potassium: 5.5 mEq/L — ABNORMAL HIGH (ref 3.5–5.1)
Sodium: 133 mEq/L — ABNORMAL LOW (ref 135–145)

## 2013-02-02 NOTE — Progress Notes (Addendum)
Lab values abnormal.  Called Dr Avel Sensor office, reported abnormal sodium and potassium values to his nurse, Selena Batten. States she will inform Dr Suszanne Conners ASAP.  Please see computer for specific values.

## 2013-02-02 NOTE — Patient Instructions (Signed)
Continue current plan with prednisone and biopsy.

## 2013-02-02 NOTE — Progress Notes (Signed)
[facility]  PATIENT: [name] DOB: 1940-11-13  REFERRING CLINICIAN: Artist Pais HISTORY FROM: patient REASON FOR VISIT: new consult   HISTORICAL  CHIEF COMPLAINT:  Chief Complaint  Patient presents with  . Neurologic Problem    HISTORY OF PRESENT ILLNESS:   72 year old right-handed female with history of Sjgren syndrome, hypercholesterolemia, depression, fibromyalgia, here for evaluation of left temporal and parietal headache/pain.  For past 2 years patient has had intermittent episodes of pressure in her left temporal and left parietal region. Symptoms last up to one hour at a time. Initially symptoms were occurring every few months, but have increased in frequency the last 3 months. Patient also has history of right parietal meningioma with left arm numbness, followed by Dr. Venetia Maxon. As a result of increasing left temporal pressure sensation, she went to Dr. Venetia Maxon who ordered ESR, CRP which were found to be elevated. Therefore patient followed up with primary care and rheumatology for possible temporal arteritis. She was started empirically on prednisone 3 weeks ago and since that time symptoms have improved. She's scheduled for left temporal artery biopsy tomorrow.  No vision changes, eye pain, jaw pain, jaw weakness. No nausea, vomiting, sensitivity to light or sound.  REVIEW OF SYSTEMS: Full 14 system review of systems performed and notable only for numbness depression not and asleep for pain aching muscles weight gain.  ALLERGIES: Allergies  Allergen Reactions  . Losartan Other (See Comments)    Causes somnolence  . Amoxicillin Rash  . Beta Adrenergic Blockers Diarrhea  . Clindamycin/Lincomycin Diarrhea and Rash    HOME MEDICATIONS: Outpatient Prescriptions Prior to Visit  Medication Sig Dispense Refill  . Calcium Carbonate-Vitamin D (CALCIUM + D) 600-200 MG-UNIT TABS Take 2 tablets by mouth daily.        . folic acid (FOLVITE) 1 MG tablet Take 1  mg by mouth daily.        . Glucosamine-Chondroit-Vit C-Mn (GLUCOSAMINE 1500 COMPLEX) CAPS Take 1,500 capsules by mouth 2 (two) times daily.        . Omega-3 Fatty Acids (FISH OIL) 1000 MG CAPS Take 1,000 capsules by mouth daily.        Marland Kitchen omeprazole (PRILOSEC) 20 MG capsule Take 1 capsule (20 mg total) by mouth daily. As needed for indigestion  90 capsule  3  . predniSONE (DELTASONE) 20 MG tablet Take 2 tablets (40 mg total) by mouth daily. Take 3 tablets daily  20 tablet  0  . simvastatin (ZOCOR) 20 MG tablet Take 1 tablet (20 mg total) by mouth at bedtime.  90 tablet  1  . ALPRAZolam (XANAX) 0.25 MG tablet 0.25 mg. 1/2 to one tablet twice daily as needed      . traMADol (ULTRAM) 50 MG tablet Take 1 tablet (50 mg total) by mouth every 8 (eight) hours as needed for pain.  30 tablet  0   No facility-administered medications prior to visit.    PAST MEDICAL HISTORY: Past Medical History  Diagnosis Date  . Hyperlipidemia   . Sjogren's disease   . Meningioma 11/2009    right post parietal 16mm - stable 11/2009 MRI  . Fibromyalgia   . GERD (gastroesophageal reflux disease)   . Hypertension   . Diverticulosis   . Arthritis     right thumb [facility] arthritis  . Nephrolithiasis   . Complication of anesthesia     has dry mouth anyway from shograns-sore throats post op    PAST SURGICAL HISTORY: Past Surgical History  Procedure  Laterality Date  . Vaginal hysterectomy  1989  . Kidney stone surgery  1974  . Mass excision  2008    abd-benign  . Appendectomy  2008  . Tonsillectomy    . Colonoscopy      FAMILY HISTORY: Family History  Problem Relation Age of Onset  . Diabetes Neg Hx   . Stomach cancer Neg Hx   . Cancer Other     colon  . Colon cancer Mother 33  . Colon cancer Father     unknown when onset was    SOCIAL HISTORY:  History   Social History  . Marital Status: Divorced    Spouse Name: N/A    Number of Children: 2  . Years of Education: College   Occupational  History  . Retired    Social History Main Topics  . Smoking status: Never Smoker   . Smokeless tobacco: Never Used  . Alcohol Use: Yes     Comment: Rarely  . Drug Use: No  . Sexually Active: Not on file   Other Topics Concern  . Not on file   Social History Narrative   Pt lives at home alone.   Caffeine Use: 2 mugs every other day.      PHYSICAL EXAM  Filed Vitals:   02/02/13 1127  BP: 149/79  Pulse: 79  Height: 5\' 9"  (1.753 m)  Weight: 220 lb (99.791 kg)    Not recorded    Body mass index is 32.47 kg/(m^2).  GENERAL EXAM: Patient is in no distress  CARDIOVASCULAR: Regular rate and rhythm, no murmurs, no carotid bruits; BILATERAL TEMPORAL ARTERIES PALPABLE AND SYMM, NON-TENDER.  NEUROLOGIC: MENTAL STATUS: awake, alert, language fluent, comprehension intact, naming intact CRANIAL NERVE: no papilledema on fundoscopic exam, pupils equal and reactive to light, visual fields full to confrontation, extraocular muscles intact, no nystagmus, facial sensation and strength symmetric, uvula midline, shoulder shrug symmetric, tongue midline. MOTOR: normal bulk and tone, full strength in the BUE, BLE SENSORY: normal and symmetric to light touch, pinprick, temperature, vibration COORDINATION: finger-nose-finger, fine finger movements normal REFLEXES: deep tendon reflexes present and symmetric; TRACE AT KNEES. ABSENT AT ANKLES. GAIT/STATION: narrow based gait; able to tandem; romberg is negative   DIAGNOSTIC DATA (LABS, IMAGING, TESTING) - I reviewed patient records, labs, notes, testing and imaging myself where available.  Lab Results  Component Value Date   WBC 5.7 05/01/2012   HGB 11.6* 05/01/2012   HCT 35.0* 05/01/2012   MCV 89.7 05/01/2012   PLT 215.0 05/01/2012      Component Value Date/Time   NA 141 05/01/2012 0911   K 4.4 05/01/2012 0911   CL 106 05/01/2012 0911   CO2 23 05/01/2012 0911   GLUCOSE 92 05/01/2012 0911   BUN 14 05/01/2012 0911   CREATININE 0.7 05/01/2012  0911   CALCIUM 9.4 05/01/2012 0911   PROT 7.7 05/01/2012 0911   ALBUMIN 3.8 05/01/2012 0911   AST 26 05/01/2012 0911   ALT 26 05/01/2012 0911   ALKPHOS 79 05/01/2012 0911   BILITOT 0.5 05/01/2012 0911   GFRNONAA 92.73 02/02/2010 0931   GFRAA 92 09/01/2008 0954   Lab Results  Component Value Date   CHOL 220* 05/01/2012   HDL 45.00 05/01/2012   LDLCALC 112* 05/07/2011   LDLDIRECT 144.0 05/01/2012   TRIG 204.0* 05/01/2012   CHOLHDL 5 05/01/2012   Lab Results  Component Value Date   HGBA1C 6.0 12/17/2006   Lab Results  Component Value Date   VITAMINB12 1154*  12/17/2006   Lab Results  Component Value Date   TSH 1.83 05/01/2012    01/15/13 ESR 55, CRP 3.8  01/15/13 MRI brain - Stable right parieto-occipital extra-axial mass consistent with meningioma.   ASSESSMENT AND PLAN  72 y.o. year old female  has a past medical history of Hyperlipidemia; Sjogren's disease; Meningioma (11/2009); Fibromyalgia; GERD (gastroesophageal reflux disease); Hypertension; Diverticulosis; Arthritis; Nephrolithiasis; and Complication of anesthesia. here with LEFT TEMPORAL/PARIETAL pressure/pain. 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 Agree with empiric prednisone and left temporal artery biopsy. No alternate neurologic explanation of symptoms at this time.   Suanne Marker, MD 02/02/2013, 12:09 PM Certified in Neurology, Neurophysiology and Neuroimaging  [facility] [address] [place], [place] [address] [phone]

## 2013-02-03 ENCOUNTER — Encounter (HOSPITAL_BASED_OUTPATIENT_CLINIC_OR_DEPARTMENT_OTHER)
Admission: RE | Disposition: A | Discharge status: Home / Self Care | Payer: Self-pay | Source: Ambulatory Visit | Attending: Otolaryngology

## 2013-02-03 ENCOUNTER — Encounter (HOSPITAL_BASED_OUTPATIENT_CLINIC_OR_DEPARTMENT_OTHER): Payer: Self-pay | Admitting: Anesthesiology

## 2013-02-03 ENCOUNTER — Ambulatory Visit (HOSPITAL_BASED_OUTPATIENT_CLINIC_OR_DEPARTMENT_OTHER)
Admission: RE | Admit: 2013-02-03 | Discharge: 2013-02-03 | Disposition: A | Discharge status: Home / Self Care | Payer: Medicare Other | Source: Ambulatory Visit | Attending: Otolaryngology | Admitting: Otolaryngology

## 2013-02-03 DIAGNOSIS — M316 Other giant cell arteritis: Secondary | ICD-10-CM

## 2013-02-03 DIAGNOSIS — R700 Elevated erythrocyte sedimentation rate: Secondary | ICD-10-CM | POA: Insufficient documentation

## 2013-02-03 HISTORY — DX: Other complications of anesthesia, initial encounter: T88.59XA

## 2013-02-03 HISTORY — PX: ARTERY BIOPSY: SHX891

## 2013-02-03 HISTORY — DX: Adverse effect of unspecified anesthetic, initial encounter: T41.45XA

## 2013-02-03 SURGERY — MINOR BIOPSY TEMPORAL ARTERY

## 2013-02-03 MED ORDER — MIDAZOLAM HCL 2 MG/2ML IJ SOLN: 1.0000 mg | INTRAMUSCULAR | Status: DC | PRN

## 2013-02-03 MED ORDER — FENTANYL CITRATE 0.05 MG/ML IJ SOLN: 50.0000 ug | INTRAMUSCULAR | Status: DC | PRN

## 2013-02-03 MED ORDER — LIDOCAINE-EPINEPHRINE 1 %-1:100000 IJ SOLN
INTRAMUSCULAR | Status: DC | PRN
  Administered 2013-02-03: 2 mL

## 2013-02-03 MED ORDER — LACTATED RINGERS IV SOLN: INTRAVENOUS | Status: DC

## 2013-02-03 MED ORDER — HYDROCODONE-ACETAMINOPHEN 5-325 MG PO TABS: 1.0000 | ORAL_TABLET | ORAL | Status: DC | PRN

## 2013-02-03 SURGICAL SUPPLY — 49 items
ADH SKN CLS APL DERMABOND .7 (GAUZE/BANDAGES/DRESSINGS) ×2
APL SKNCLS STERI-STRIP NONHPOA (GAUZE/BANDAGES/DRESSINGS)
BANDAGE ADHESIVE 1X3 (GAUZE/BANDAGES/DRESSINGS) IMPLANT
BENZOIN TINCTURE PRP APPL 2/3 (GAUZE/BANDAGES/DRESSINGS) IMPLANT
BLADE SURG 15 STRL LF DISP TIS (BLADE) ×2 IMPLANT
BLADE SURG 15 STRL SS (BLADE) ×3
BLADE SURG ROTATE 9660 (MISCELLANEOUS) IMPLANT
CANISTER SUCTION 1200CC (MISCELLANEOUS) IMPLANT
CLEANER CAUTERY TIP 5X5 PAD (MISCELLANEOUS) ×2 IMPLANT
CORDS BIPOLAR (ELECTRODE) IMPLANT
COVER MAYO STAND STRL (DRAPES) ×3 IMPLANT
COVER PROBE W GEL 5X96 (DRAPES) IMPLANT
COVER TABLE BACK 60X90 (DRAPES) IMPLANT
DERMABOND ADVANCED (GAUZE/BANDAGES/DRESSINGS) ×1
DERMABOND ADVANCED .7 DNX12 (GAUZE/BANDAGES/DRESSINGS) ×1 IMPLANT
DRAPE SURG 17X23 STRL (DRAPES) IMPLANT
DRAPE U-SHAPE 76X120 STRL (DRAPES) IMPLANT
ELECT COATED BLADE 2.86 ST (ELECTRODE) ×3 IMPLANT
ELECT NDL BLADE 2-5/6 (NEEDLE) IMPLANT
ELECT NEEDLE BLADE 2-5/6 (NEEDLE) IMPLANT
ELECT REM PT RETURN 9FT ADLT (ELECTROSURGICAL) ×3
ELECTRODE REM PT RTRN 9FT ADLT (ELECTROSURGICAL) ×1 IMPLANT
GAUZE SPONGE 4X4 12PLY STRL LF (GAUZE/BANDAGES/DRESSINGS) IMPLANT
GLOVE BIO SURGEON STRL SZ7.5 (GLOVE) ×3 IMPLANT
GLOVE SURG SS PI 7.0 STRL IVOR (GLOVE) ×2 IMPLANT
GOWN PREVENTION PLUS XLARGE (GOWN DISPOSABLE) ×6 IMPLANT
NDL HYPO 25X1 1.5 SAFETY (NEEDLE) IMPLANT
NEEDLE 27GAX1X1/2 (NEEDLE) ×3 IMPLANT
NEEDLE HYPO 25X1 1.5 SAFETY (NEEDLE) IMPLANT
NS IRRIG 1000ML POUR BTL (IV SOLUTION) ×3 IMPLANT
PACK BASIN DAY SURGERY FS (CUSTOM PROCEDURE TRAY) ×3 IMPLANT
PAD CLEANER CAUTERY TIP 5X5 (MISCELLANEOUS) ×1
PENCIL BUTTON HOLSTER BLD 10FT (ELECTRODE) ×2 IMPLANT
SHEET MEDIUM DRAPE 40X70 STRL (DRAPES) ×3 IMPLANT
SPONGE GAUZE 2X2 8PLY STRL LF (GAUZE/BANDAGES/DRESSINGS) IMPLANT
STRIP CLOSURE SKIN 1/2X4 (GAUZE/BANDAGES/DRESSINGS) IMPLANT
SUCTION FRAZIER TIP 10 FR DISP (SUCTIONS) IMPLANT
SUT CHROMIC 4 0 P 3 18 (SUTURE) IMPLANT
SUT ETHILON 5 0 P 3 18 (SUTURE)
SUT NYLON ETHILON 5-0 P-3 1X18 (SUTURE) IMPLANT
SUT SILK 3 0 TIES 17X18 (SUTURE) ×3
SUT SILK 3-0 18XBRD TIE BLK (SUTURE) ×2 IMPLANT
SUT SILK 4 0 TIES 17X18 (SUTURE) IMPLANT
SUT VICRYL 4-0 PS2 18IN ABS (SUTURE) ×2 IMPLANT
SWABSTICK POVIDONE IODINE SNGL (MISCELLANEOUS) ×4 IMPLANT
SYR CONTROL 10ML LL (SYRINGE) ×3 IMPLANT
TOWEL OR 17X24 6PK STRL BLUE (TOWEL DISPOSABLE) ×3 IMPLANT
TRAY DSU PREP LF (CUSTOM PROCEDURE TRAY) IMPLANT
TUBE CONNECTING 20X1/4 (TUBING) IMPLANT

## 2013-02-03 NOTE — Brief Op Note (Signed)
02/03/2013  12:04 PM  PATIENT:  [name]  72 y.o. female  PRE-OPERATIVE DIAGNOSIS:  TEMPORAL ARTERITIS   POST-OPERATIVE DIAGNOSIS:  TEMPORAL ARTERITIS   PROCEDURE:  Procedure(s): LEFT TEMPORAL ARTERY BIOPSY  (Left)  SURGEON:  Surgeon(s) and Role:    * Darletta Moll, MD - Primary  PHYSICIAN ASSISTANT:   ASSISTANTS: none   ANESTHESIA:   local  EBL:     BLOOD ADMINISTERED:none  DRAINS: none   LOCAL MEDICATIONS USED:  LIDOCAINE   SPECIMEN:  Source of Specimen:  left temporal artery  DISPOSITION OF SPECIMEN:  PATHOLOGY  COUNTS:  YES  TOURNIQUET:  * No tourniquets in log *  DICTATION: .Other Dictation: Dictation Number    PLAN OF CARE: Discharge to home after PACU  PATIENT DISPOSITION:  PACU - hemodynamically stable.   Delay start of Pharmacological VTE agent (>24hrs) due to surgical blood loss or risk of bleeding: not applicable

## 2013-02-04 ENCOUNTER — Encounter (HOSPITAL_BASED_OUTPATIENT_CLINIC_OR_DEPARTMENT_OTHER): Payer: Self-pay | Admitting: Otolaryngology

## 2013-02-04 NOTE — Op Note (Signed)
NAMEJAVONNE, LOUISSAINT              ACCOUNT NO.:  [id number]  MEDICAL RECORD NO.:  [id number]  LOCATION:                                 FACILITY:  PHYSICIAN:  Newman Pies, MD                 DATE OF BIRTH:  DATE OF PROCEDURE:  02/03/2013 DATE OF DISCHARGE:                              OPERATIVE REPORT   SURGEON:  Newman Pies, MD  PREOPERATIVE DIAGNOSIS:  Possible temporal arteritis.  POSTOPERATIVE DIAGNOSIS:  Possible temporal arteritis.  PROCEDURE PERFORMED:  Left temporal artery biopsy.  ANESTHESIA:  Local anesthesia with 1% lidocaine with 1:100,000 epinephrine.  COMPLICATIONS:  None.  ESTIMATED BLOOD LOSS:  Minimal.  INDICATION FOR PROCEDURE:  The patient is a 72 year old female with a history of pressure sensation and discomfort over her left temporal area for the past 3 years.  She was noted to have an elevated sed rate.  It was suggestive of temporal arteritis.  She was treated with prednisone, with significant improvement in her symptoms.  Based on the above findings, the decision was made for the patient to undergo the above stated procedure for definitive diagnosis of temporal arteritis.  The risks, benefits, alternatives, and details of the procedure were discussed with the patient.  Questions were invited and answered. Informed consent was obtained.  DESCRIPTION:  The patient was taken to the operating room and placed supine on the operating table.  Lidocaine 1% with 1:100,000 epinephrine was injected over the left temporal area.  Using a Doppler device, the left temporal artery was identified.  1% lidocaine with 1:100,000 epinephrine was injected over the left temporal artery.  A vertical incision was made over the temporal artery.  The incision was carried down to the level of the artery.  The 2 cm segment of the artery was then removed.  The specimen was sent to the Pathology Department for permanent histologic identification.  The surgical site was  copiously irrigated.  The incision was closed in layers with 4-0 Vicryl and Dermabond.  The patient tolerated the procedure well without difficulty.  OPERATIVE FINDINGS:  A 2-cm segment of left temporal artery was removed.  SPECIMEN:  Left temporal artery.  FOLLOWUP CARE:  The patient be discharged home once she is awake and alert.  She may take Vicodin p.r.n. pain.  She will follow up in my office in approximately 1 week.     Newman Pies, MD     ST/MEDQ  D:  02/03/2013  T:  02/04/2013  Job:  [id number]  cc:   Azzie Roup, MD

## 2013-02-05 ENCOUNTER — Encounter (HOSPITAL_BASED_OUTPATIENT_CLINIC_OR_DEPARTMENT_OTHER): Payer: Self-pay | Admitting: Otolaryngology

## 2013-03-16 ENCOUNTER — Other Ambulatory Visit: Payer: Self-pay | Admitting: *Deleted

## 2013-03-16 MED ORDER — SIMVASTATIN 20 MG PO TABS: 20.0000 mg | ORAL_TABLET | Freq: Every day | ORAL | Status: DC

## 2013-04-05 ENCOUNTER — Encounter (HOSPITAL_BASED_OUTPATIENT_CLINIC_OR_DEPARTMENT_OTHER): Payer: Self-pay | Admitting: Otolaryngology

## 2013-04-21 ENCOUNTER — Other Ambulatory Visit: Payer: Self-pay | Admitting: Internal Medicine

## 2013-05-14 ENCOUNTER — Encounter: Payer: Self-pay | Admitting: Internal Medicine

## 2013-05-14 ENCOUNTER — Ambulatory Visit (INDEPENDENT_AMBULATORY_CARE_PROVIDER_SITE_OTHER): Payer: Medicare Other | Admitting: Internal Medicine

## 2013-05-14 VITALS — BP 168/80 | Temp 97.9°F | Wt 225.0 lb

## 2013-05-14 DIAGNOSIS — R7309 Other abnormal glucose: Secondary | ICD-10-CM

## 2013-05-14 DIAGNOSIS — R51 Headache: Secondary | ICD-10-CM

## 2013-05-14 DIAGNOSIS — R739 Hyperglycemia, unspecified: Secondary | ICD-10-CM

## 2013-05-14 MED ORDER — VALSARTAN 40 MG PO TABS: 40.0000 mg | ORAL_TABLET | Freq: Every day | ORAL | Status: DC

## 2013-05-14 MED ORDER — POTASSIUM CHLORIDE CRYS ER 20 MEQ PO TBCR: 20.0000 meq | EXTENDED_RELEASE_TABLET | Freq: Every day | ORAL | Status: DC

## 2013-05-14 NOTE — Assessment & Plan Note (Signed)
Patient had response to prednisone. There is still question whether this was truly temporal arteritis. She had temporal artery biopsy which was negative. Dr. Dareen Piano discontinued prednisone. She was asymptomatic until several weeks ago. She has some intermittent left eye symptoms. Her symptoms may be secondary to migraine. I suggest using prophylactic medication. Start Diovan 40 mg since her blood pressure slightly elevated. If no improvement she may need referral to neurologist.  She reports she does not want to go back to Dr. Ewell Poe office nor does she want to return to [facility].

## 2013-05-14 NOTE — Progress Notes (Signed)
Subjective:    Patient ID: [name], female    DOB: 03-Aug-1941, 72 y.o.   MRN: [id number]  HPI  72 year old white female previously seen for left-sided headache for followup. Interval history-she was seen by rheumatologist and neurologist. Patient had a slightly elevated sedimentation rate. She had good response to prednisone which led to temporal artery biopsy. Temporal artery biopsy was negative for temporal arteritis. She reports she was tapered off prednisone. She had some mild withdrawal symptoms and weight gain associated with prednisone use. Patient reports pressure sensation left side of head went away but seemed to return on August 30th. Patient was working on her computer when she experienced visual changes. She saw "waves and irregular lines in her left eye. Her symptoms only lasted one day. She did not followup with her ophthalmologist. Her full eye exam in April of 2014 was unremarkable.  She was relatively asymptomatic until September 9th. She had recurrence of left parietal pressure/pain. It localized to left side of her head - left parietal and  occipital area. She rates severity as 4/10.  Her followup MRI did not show any significant change. She has history of right post parietal meningioma. Review of Systems Negative for fever chills, negative for left jaw pain    Past Medical History  Diagnosis Date  . Hyperlipidemia   . Sjogren's disease   . Meningioma 11/2009    right post parietal 16mm - stable 11/2009 MRI  . Fibromyalgia   . GERD (gastroesophageal reflux disease)   . Hypertension   . Diverticulosis   . Arthritis     right thumb [facility] arthritis  . Nephrolithiasis   . Complication of anesthesia     has dry mouth anyway from shograns-sore throats post op    History   Social History  . Marital Status: Divorced    Spouse Name: N/A    Number of Children: 2  . Years of Education: College   Occupational History  . Retired    Social History Main Topics  .  Smoking status: Never Smoker   . Smokeless tobacco: Never Used  . Alcohol Use: Yes     Comment: Rarely  . Drug Use: No  . Sexual Activity: Not on file   Other Topics Concern  . Not on file   Social History Narrative   Pt lives at home alone.   Caffeine Use: 2 mugs every other day.     Past Surgical History  Procedure Laterality Date  . Vaginal hysterectomy  1989  . Kidney stone surgery  1974  . Mass excision  2008    abd-benign  . Appendectomy  2008  . Tonsillectomy    . Colonoscopy    . Artery biopsy  02/03/2013    Procedure: MINOR BIOPSY TEMPORAL ARTERY;  Surgeon: Darletta Moll, MD;  Location: [facility];  Service: ENT;;    Family History  Problem Relation Age of Onset  . Diabetes Neg Hx   . Stomach cancer Neg Hx   . Cancer Other     colon  . Colon cancer Mother 24  . Colon cancer Father     unknown when onset was    Allergies  Allergen Reactions  . Losartan Other (See Comments)    Causes somnolence  . Amoxicillin Rash  . Beta Adrenergic Blockers Diarrhea  . Clindamycin/Lincomycin Diarrhea and Rash    Current Outpatient Prescriptions on File Prior to Visit  Medication Sig Dispense Refill  . Calcium Carbonate-Vitamin D (CALCIUM +  D) 600-200 MG-UNIT TABS Take 2 tablets by mouth daily.        . folic acid (FOLVITE) 1 MG tablet Take 1 mg by mouth daily.        . Glucosamine-Chondroit-Vit C-Mn (GLUCOSAMINE 1500 COMPLEX) CAPS Take 1,500 capsules by mouth 2 (two) times daily.        . magnesium citrate SOLN Take 1 Bottle by mouth once.      . Omega-3 Fatty Acids (FISH OIL) 1000 MG CAPS Take 1,000 capsules by mouth daily.        Marland Kitchen omeprazole (PRILOSEC) 20 MG capsule TAKE 1 CAPSULE BY MOUTH DAILY AS NEEDED INDIGESTION  90 capsule  0  . potassium chloride (KLOR-CON) 20 MEQ packet Take 20 mEq by mouth 2 (two) times daily.      . simvastatin (ZOCOR) 20 MG tablet Take 1 tablet (20 mg total) by mouth at bedtime.  90 tablet  1   No current  facility-administered medications on file prior to visit.    BP 168/80  Temp(Src) 97.9 F (36.6 C) (Oral)  Wt 225 lb (102.059 kg)  BMI 33.21 kg/m2    Objective:   Physical Exam  Constitutional: She is oriented to person, place, and time. She appears well-developed and well-nourished. No distress.  HENT:  Head: Normocephalic and atraumatic.  No temporal tenderness.  No tenderness of left TMJ joint  Eyes: Conjunctivae and EOM are normal. Pupils are equal, round, and reactive to light.  Cardiovascular: Normal rate, regular rhythm and normal heart sounds.   No murmur heard. Pulmonary/Chest: Effort normal and breath sounds normal. She has no wheezes.  Musculoskeletal: She exhibits no edema.  Neurological: She is alert and oriented to person, place, and time. No cranial nerve deficit.  Skin: Skin is warm and dry.  Psychiatric: She has a normal mood and affect. Her behavior is normal.          Assessment & Plan:

## 2013-05-14 NOTE — Patient Instructions (Signed)
Please complete the following lab tests before your next follow up appointment: BMET - 401.9 FLP, LFTs, TSH - 272.4 A1c - 790.29 

## 2013-05-14 NOTE — Assessment & Plan Note (Signed)
Monitor A1c 

## 2013-06-10 ENCOUNTER — Other Ambulatory Visit (INDEPENDENT_AMBULATORY_CARE_PROVIDER_SITE_OTHER): Payer: Medicare Other

## 2013-06-10 DIAGNOSIS — I10 Essential (primary) hypertension: Secondary | ICD-10-CM

## 2013-06-10 DIAGNOSIS — R7309 Other abnormal glucose: Secondary | ICD-10-CM

## 2013-06-10 DIAGNOSIS — E785 Hyperlipidemia, unspecified: Secondary | ICD-10-CM

## 2013-06-10 LAB — HEPATIC FUNCTION PANEL
Albumin: 3.9 g/dL (ref 3.5–5.2)
Alkaline Phosphatase: 88 U/L (ref 39–117)
Total Protein: 7.6 g/dL (ref 6.0–8.3)

## 2013-06-10 LAB — LIPID PANEL
Cholesterol: 201 mg/dL — ABNORMAL HIGH (ref 0–200)
HDL: 45.7 mg/dL (ref >39.00)
Triglycerides: 213 mg/dL — ABNORMAL HIGH (ref 0.0–149.0)

## 2013-06-10 LAB — BASIC METABOLIC PANEL
BUN: 14 mg/dL (ref 6–23)
CO2: 28 mEq/L (ref 19–32)
Calcium: 9.6 mg/dL (ref 8.4–10.5)
Creatinine, Ser: 0.7 mg/dL (ref 0.4–1.2)
GFR: 91.85 mL/min (ref >60.00)
Glucose, Bld: 103 mg/dL — ABNORMAL HIGH (ref 70–99)

## 2013-06-10 LAB — HEMOGLOBIN A1C: Hgb A1c MFr Bld: 6.5 % (ref 4.6–6.5)

## 2013-06-10 LAB — LDL CHOLESTEROL, DIRECT: Direct LDL: 129.5 mg/dL

## 2013-06-10 LAB — TSH: TSH: 2.28 u[IU]/mL (ref 0.35–5.50)

## 2013-06-15 ENCOUNTER — Ambulatory Visit: Payer: Medicare Other | Admitting: Internal Medicine

## 2013-07-03 ENCOUNTER — Ambulatory Visit (INDEPENDENT_AMBULATORY_CARE_PROVIDER_SITE_OTHER): Payer: Medicare Other | Admitting: Internal Medicine

## 2013-07-03 ENCOUNTER — Encounter: Payer: Self-pay | Admitting: Internal Medicine

## 2013-07-03 VITALS — BP 156/82 | HR 84 | Temp 98.1°F | Ht 69.0 in | Wt 225.0 lb

## 2013-07-03 DIAGNOSIS — K219 Gastro-esophageal reflux disease without esophagitis: Secondary | ICD-10-CM

## 2013-07-03 DIAGNOSIS — R197 Diarrhea, unspecified: Secondary | ICD-10-CM

## 2013-07-03 DIAGNOSIS — K529 Noninfective gastroenteritis and colitis, unspecified: Secondary | ICD-10-CM

## 2013-07-03 DIAGNOSIS — E119 Type 2 diabetes mellitus without complications: Secondary | ICD-10-CM

## 2013-07-03 DIAGNOSIS — E785 Hyperlipidemia, unspecified: Secondary | ICD-10-CM

## 2013-07-03 DIAGNOSIS — I10 Essential (primary) hypertension: Secondary | ICD-10-CM

## 2013-07-03 MED ORDER — OMEPRAZOLE 20 MG PO CPDR: 20.0000 mg | DELAYED_RELEASE_CAPSULE | Freq: Every day | ORAL | Status: DC

## 2013-07-03 MED ORDER — SIMVASTATIN 20 MG PO TABS: 20.0000 mg | ORAL_TABLET | Freq: Every day | ORAL | Status: DC

## 2013-07-03 MED ORDER — COLESEVELAM HCL 625 MG PO TABS: 625.0000 mg | ORAL_TABLET | Freq: Every day | ORAL | Status: DC | PRN

## 2013-07-03 MED ORDER — VALSARTAN 80 MG PO TABS: 80.0000 mg | ORAL_TABLET | Freq: Every day | ORAL | Status: DC

## 2013-07-03 NOTE — Patient Instructions (Signed)
Please limit your carbohydrate intake to 25-30 grams per meal Start daily walking program (30 minutes) Please complete the following lab tests before your next follow up appointment: BMET - 401.9

## 2013-07-03 NOTE — Progress Notes (Signed)
Subjective:    Patient ID: [name], female    DOB: 23-Nov-1940, 72 y.o.   MRN: [id number]  HPI  72 y/o white female with hx of Sjogren's, Meningioma, and abnormal glucose for follow up.  At previous visit, valsartan 40 mg started.  She is tolerating well but feels she occasionally has loose stools.  Pressure sensation in her left temporal area improved but not resolved.  Abnormal glucose - her A1c worse at 6.5.  Hyperlipidemia - tolerating statin.   Review of Systems  Negative for chest pain.  She follows healthy diet.  She does not exercise on a regular basis  Past Medical History  Diagnosis Date  . Hyperlipidemia   . Sjogren's disease   . Meningioma 11/2009    right post parietal 16mm - stable 11/2009 MRI  . Fibromyalgia   . GERD (gastroesophageal reflux disease)   . Hypertension   . Diverticulosis   . Arthritis     right thumb CMC arthritis  . Nephrolithiasis   . Complication of anesthesia     has dry mouth anyway from shograns-sore throats post op    History   Social History  . Marital Status: Divorced    Spouse Name: N/A    Number of Children: 2  . Years of Education: College   Occupational History  . Retired    Social History Main Topics  . Smoking status: Never Smoker   . Smokeless tobacco: Never Used  . Alcohol Use: Yes     Comment: Rarely  . Drug Use: No  . Sexual Activity: Not on file   Other Topics Concern  . Not on file   Social History Narrative   Pt lives at home alone.   Caffeine Use: 2 mugs every other day.     Past Surgical History  Procedure Laterality Date  . Vaginal hysterectomy  1989  . Kidney stone surgery  1974  . Mass excision  2008    abd-benign  . Appendectomy  2008  . Tonsillectomy    . Colonoscopy    . Artery biopsy  02/03/2013    Procedure: MINOR BIOPSY TEMPORAL ARTERY;  Surgeon: Darletta Moll, MD;  Location: [facility];  Service: ENT;;    Family History  Problem Relation Age of Onset  .  Diabetes Neg Hx   . Stomach cancer Neg Hx   . Cancer Other     colon  . Colon cancer Mother 14  . Colon cancer Father     unknown when onset was    Allergies  Allergen Reactions  . Losartan Other (See Comments)    Causes somnolence  . Amoxicillin Rash  . Beta Adrenergic Blockers Diarrhea  . Clindamycin/Lincomycin Diarrhea and Rash    Current Outpatient Prescriptions on File Prior to Visit  Medication Sig Dispense Refill  . Calcium Carbonate-Vitamin D (CALCIUM + D) 600-200 MG-UNIT TABS Take 2 tablets by mouth daily.        . folic acid (FOLVITE) 1 MG tablet Take 1 mg by mouth daily.        . Glucosamine-Chondroit-Vit C-Mn (GLUCOSAMINE 1500 COMPLEX) CAPS Take 1,500 capsules by mouth 2 (two) times daily.        . Omega-3 Fatty Acids (FISH OIL) 1000 MG CAPS Take 1,000 capsules by mouth daily.        Marland Kitchen omeprazole (PRILOSEC) 20 MG capsule TAKE 1 CAPSULE BY MOUTH DAILY AS NEEDED INDIGESTION  90 capsule  0  . potassium chloride SA (  KLOR-CON M20) 20 MEQ tablet Take 1 tablet (20 mEq total) by mouth daily.  30 tablet  5  . simvastatin (ZOCOR) 20 MG tablet Take 1 tablet (20 mg total) by mouth at bedtime.  90 tablet  1  . valsartan (DIOVAN) 40 MG tablet Take 1 tablet (40 mg total) by mouth daily.  30 tablet  2  . [DISCONTINUED] potassium chloride (KLOR-CON) 20 MEQ packet Take 20 mEq by mouth 2 (two) times daily.       No current facility-administered medications on file prior to visit.    BP 156/82  Pulse 84  Temp(Src) 98.1 F (36.7 C) (Oral)  Ht 5\' 9"  (1.753 m)  Wt 225 lb (102.059 kg)  BMI 33.21 kg/m2       Objective:   Physical Exam  Constitutional: She is oriented to person, place, and time. She appears well-developed and well-nourished. No distress.  HENT:  Head: Normocephalic and atraumatic.  Right Ear: External ear normal.  Left Ear: External ear normal.  Eyes: EOM are normal. Pupils are equal, round, and reactive to light.  Neck: Neck supple.  No carotid bruit   Cardiovascular: Normal rate, regular rhythm and normal heart sounds.   Pulmonary/Chest: Effort normal and breath sounds normal. She has no wheezes.  Musculoskeletal: She exhibits no edema.  Neurological: She is alert and oriented to person, place, and time. No cranial nerve deficit.  Skin: Skin is warm and dry.  Psychiatric: She has a normal mood and affect. Her behavior is normal.       Assessment & Plan:

## 2013-07-03 NOTE — Assessment & Plan Note (Signed)
She has new onset type II diabetes.  A1c 6.5.  She would prefer trial of lifestyle changes for now.

## 2013-07-03 NOTE — Assessment & Plan Note (Signed)
Stable.  Continue PPI. 

## 2013-07-03 NOTE — Assessment & Plan Note (Signed)
Increase Valsartan to 80 mg.  Patient reports home BP readings better.  BP: 156/82 mmHg

## 2013-07-03 NOTE — Assessment & Plan Note (Signed)
Trial of Welchol.  Samples provided.

## 2013-07-03 NOTE — Progress Notes (Signed)
Pre visit review using our clinic review tool, if applicable. No additional management support is needed unless otherwise documented below in the visit note. 

## 2013-07-03 NOTE — Assessment & Plan Note (Signed)
She is tolerating simvastatin.  She could not tolerate Lipitor in

## 2013-07-28 ENCOUNTER — Telehealth: Payer: Self-pay | Admitting: Internal Medicine

## 2013-07-28 NOTE — Telephone Encounter (Addendum)
Pt states she would like to reduce her valsartan (DIOVAN) 80 MG tablet . Pt states the 80 mg is too much for her. She states she is not sleeping at night, awake all night!! Pt also has rash on chest and a cough. pls advise. Pt only pu 30 day supply of the last rx of diovan. Pharm [facility] [address]

## 2013-07-29 MED ORDER — IRBESARTAN 75 MG PO TABS: 75.0000 mg | ORAL_TABLET | Freq: Every day | ORAL | Status: DC

## 2013-07-29 NOTE — Telephone Encounter (Signed)
rx sent in electronically, pt aware, she already has a lab appt and follow up appt next month

## 2013-07-29 NOTE — Telephone Encounter (Signed)
DC Valsartan 80 mg.  Change in Irbesartan 75 mg #30 - one po qd  RF x 1.  I suggest OV within 1 month of starting new medication.  BMET before OV. 401.9

## 2013-08-28 ENCOUNTER — Other Ambulatory Visit (INDEPENDENT_AMBULATORY_CARE_PROVIDER_SITE_OTHER): Payer: Medicare Other

## 2013-08-28 DIAGNOSIS — I10 Essential (primary) hypertension: Secondary | ICD-10-CM

## 2013-08-28 LAB — BASIC METABOLIC PANEL
BUN: 17 mg/dL (ref 6–23)
CHLORIDE: 104 meq/L (ref 96–112)
CO2: 28 mEq/L (ref 19–32)
Calcium: 9.6 mg/dL (ref 8.4–10.5)
Creatinine, Ser: 0.7 mg/dL (ref 0.4–1.2)
GFR: 93.4 mL/min (ref >60.00)
GLUCOSE: 107 mg/dL — AB (ref 70–99)
POTASSIUM: 4.3 meq/L (ref 3.5–5.1)
Sodium: 138 mEq/L (ref 135–145)

## 2013-09-04 ENCOUNTER — Ambulatory Visit (INDEPENDENT_AMBULATORY_CARE_PROVIDER_SITE_OTHER): Payer: Medicare Other | Admitting: Internal Medicine

## 2013-09-04 ENCOUNTER — Telehealth: Payer: Self-pay | Admitting: Internal Medicine

## 2013-09-04 ENCOUNTER — Encounter: Payer: Self-pay | Admitting: Internal Medicine

## 2013-09-04 VITALS — BP 122/72 | HR 96 | Temp 98.0°F | Ht 69.0 in | Wt 213.0 lb

## 2013-09-04 DIAGNOSIS — E785 Hyperlipidemia, unspecified: Secondary | ICD-10-CM

## 2013-09-04 DIAGNOSIS — I10 Essential (primary) hypertension: Secondary | ICD-10-CM

## 2013-09-04 DIAGNOSIS — E119 Type 2 diabetes mellitus without complications: Secondary | ICD-10-CM

## 2013-09-04 MED ORDER — SIMVASTATIN 10 MG PO TABS: 10.0000 mg | ORAL_TABLET | Freq: Every day | ORAL | Status: DC

## 2013-09-04 MED ORDER — NEBIVOLOL HCL 5 MG PO TABS: 5.0000 mg | ORAL_TABLET | Freq: Every day | ORAL | Status: DC

## 2013-09-04 NOTE — Progress Notes (Signed)
Pre visit review using our clinic review tool, if applicable. No additional management support is needed unless otherwise documented below in the visit note. 

## 2013-09-04 NOTE — Assessment & Plan Note (Signed)
Patient experiencing exacerbation of her fibromyalgia symptoms. Unclear whether this was triggered by starting Avapro versus her use of simvastatin. Decrease dose of simvastatin. Check CPK before next office visit.

## 2013-09-04 NOTE — Assessment & Plan Note (Signed)
Continue low carb diet.  Monitor A1c before next office visit.

## 2013-09-04 NOTE — Assessment & Plan Note (Signed)
Patient unable to tolerate Avapro due to increasing joint pains and dryness of her lips.  She already suffers from dry mouth from Sjogren's. It is unlikely she will tolerate other ARB's. Discontinue Avapro.  Heart heart rate is high normal. She has not tolerated traditional beta blockers in the past. Trial of Bystolic 5 mg once daily.  BP: 122/72 mmHg

## 2013-09-04 NOTE — Patient Instructions (Signed)
Please complete the following lab tests before your next follow up appointment: BMET, A1c - 790.29 FLP, LFTs, CPK - 272.4

## 2013-09-04 NOTE — Progress Notes (Signed)
Subjective:    Patient ID: [name], female    DOB: 03-25-1941, 73 y.o.   MRN: [id number]  HPI  73 year old white female with history of Sjogren's disease, hypertension, hyperlipidemia and fibromyalgia for followup.  At previous visit valsartan was switched to Avapro 75 mg once daily. Patient reports since starting her new blood pressure medication she has experienced significant drying/tingling of her lips. She also reports worsening joint pains since starting Avapro. She is maintained blood pressure log. Her average blood pressure systolic is in the 193X. She could not tolerate valsartan in the past due to side effects of diarrhea. She is sensitive to multiple medications.  Patient currently taking simvastatin 20 mg.  Mild type 2 diabetes-patient reports following low-carb diet.  Review of Systems Negative for chest pain or shortness of breath     Past Medical History  Diagnosis Date  . Hyperlipidemia   . Sjogren's disease   . Meningioma 11/2009    right post parietal 40mm - stable 11/2009 MRI  . Fibromyalgia   . GERD (gastroesophageal reflux disease)   . Hypertension   . Diverticulosis   . Arthritis     right thumb CMC arthritis  . Nephrolithiasis   . Complication of anesthesia     has dry mouth anyway from shograns-sore throats post op    History   Social History  . Marital Status: Divorced    Spouse Name: N/A    Number of Children: 2  . Years of Education: College   Occupational History  . Retired    Social History Main Topics  . Smoking status: Never Smoker   . Smokeless tobacco: Never Used  . Alcohol Use: Yes     Comment: Rarely  . Drug Use: No  . Sexual Activity: Not on file   Other Topics Concern  . Not on file   Social History Narrative   Pt lives at home alone.   Caffeine Use: 2 mugs every other day.     Past Surgical History  Procedure Laterality Date  . Vaginal hysterectomy  1989  . Kidney stone surgery  1974  . Mass excision  2008      abd-benign  . Appendectomy  2008  . Tonsillectomy    . Colonoscopy    . Artery biopsy  02/03/2013    Procedure: MINOR BIOPSY TEMPORAL ARTERY;  Surgeon: Ascencion Dike, MD;  Location: [facility];  Service: ENT;;    Family History  Problem Relation Age of Onset  . Diabetes Neg Hx   . Stomach cancer Neg Hx   . Cancer Other     colon  . Colon cancer Mother 45  . Colon cancer Father     unknown when onset was    Allergies  Allergen Reactions  . Losartan Other (See Comments)    Causes somnolence  . Amoxicillin Rash  . Beta Adrenergic Blockers Diarrhea  . Clindamycin/Lincomycin Diarrhea and Rash    Current Outpatient Prescriptions on File Prior to Visit  Medication Sig Dispense Refill  . Calcium Carbonate-Vitamin D (CALCIUM + D) 600-200 MG-UNIT TABS Take 2 tablets by mouth daily.        . folic acid (FOLVITE) 1 MG tablet Take 1 mg by mouth daily.        . Glucosamine-Chondroit-Vit C-Mn (GLUCOSAMINE 1500 COMPLEX) CAPS Take 1,500 capsules by mouth 2 (two) times daily.        . Omega-3 Fatty Acids (FISH OIL) 1000 MG CAPS Take 1,000  capsules by mouth daily.        Marland Kitchen omeprazole (PRILOSEC) 20 MG capsule Take 1 capsule (20 mg total) by mouth daily.  90 capsule  3  . simvastatin (ZOCOR) 20 MG tablet Take 1 tablet (20 mg total) by mouth at bedtime.  90 tablet  3  . Specialty Vitamins Products (MAGNESIUM, AMINO ACID CHELATE,) 133 MG tablet Take 1 tablet by mouth daily.      . [DISCONTINUED] potassium chloride (KLOR-CON) 20 MEQ packet Take 20 mEq by mouth 2 (two) times daily.       No current facility-administered medications on file prior to visit.    BP 122/72  Pulse 96  Temp(Src) 98 F (36.7 C) (Oral)  Ht 5\' 9"  (1.753 m)  Wt 213 lb (96.616 kg)  BMI 31.44 kg/m2    Objective:   Physical Exam  Constitutional: She is oriented to person, place, and time. She appears well-developed and well-nourished.  HENT:  Head: Normocephalic and atraumatic.  Peeling lips  Eyes:  EOM are normal. Pupils are equal, round, and reactive to light.  Cardiovascular: Normal rate, regular rhythm and normal heart sounds.   No murmur heard. Pulmonary/Chest: Effort normal. She has no wheezes.  Musculoskeletal: She exhibits no edema.  Neurological: She is alert and oriented to person, place, and time. No cranial nerve deficit.  Skin: Skin is warm and dry.  Psychiatric: She has a normal mood and affect. Her behavior is normal.          Assessment & Plan:

## 2013-09-04 NOTE — Telephone Encounter (Signed)
Pt states rx she picked up for nebivolol (BYSTOLIC) 5 MG tablet was nearly $100, she can not afford this medication long-term and wants to know what other options are available.

## 2013-09-08 ENCOUNTER — Telehealth: Payer: Self-pay

## 2013-09-08 NOTE — Telephone Encounter (Signed)
Relevant patient education mailed to patient.  

## 2013-09-23 ENCOUNTER — Telehealth: Payer: Self-pay | Admitting: Internal Medicine

## 2013-09-23 NOTE — Telephone Encounter (Signed)
Relevant patient education mailed to patient.  

## 2013-10-26 ENCOUNTER — Other Ambulatory Visit (INDEPENDENT_AMBULATORY_CARE_PROVIDER_SITE_OTHER): Payer: Medicare Other

## 2013-10-26 DIAGNOSIS — E785 Hyperlipidemia, unspecified: Secondary | ICD-10-CM

## 2013-10-26 DIAGNOSIS — R7309 Other abnormal glucose: Secondary | ICD-10-CM

## 2013-10-26 LAB — BASIC METABOLIC PANEL
BUN: 14 mg/dL (ref 6–23)
CALCIUM: 9.6 mg/dL (ref 8.4–10.5)
CHLORIDE: 104 meq/L (ref 96–112)
CO2: 28 meq/L (ref 19–32)
Creatinine, Ser: 0.7 mg/dL (ref 0.4–1.2)
GFR: 93.36 mL/min (ref >60.00)
GLUCOSE: 106 mg/dL — AB (ref 70–99)
Potassium: 4.4 mEq/L (ref 3.5–5.1)
SODIUM: 139 meq/L (ref 135–145)

## 2013-10-26 LAB — HEPATIC FUNCTION PANEL
ALT: 16 U/L (ref 0–35)
AST: 17 U/L (ref 0–37)
Albumin: 3.8 g/dL (ref 3.5–5.2)
Alkaline Phosphatase: 75 U/L (ref 39–117)
BILIRUBIN DIRECT: 0 mg/dL (ref 0.0–0.3)
BILIRUBIN TOTAL: 0.4 mg/dL (ref 0.3–1.2)
Total Protein: 7.5 g/dL (ref 6.0–8.3)

## 2013-10-26 LAB — LIPID PANEL
CHOLESTEROL: 200 mg/dL (ref 0–200)
HDL: 44.8 mg/dL (ref >39.00)
LDL Cholesterol: 115 mg/dL — ABNORMAL HIGH (ref 0–99)
Total CHOL/HDL Ratio: 4
Triglycerides: 201 mg/dL — ABNORMAL HIGH (ref 0.0–149.0)
VLDL: 40.2 mg/dL — AB (ref 0.0–40.0)

## 2013-10-26 LAB — HEMOGLOBIN A1C: HEMOGLOBIN A1C: 6.3 % (ref 4.6–6.5)

## 2013-10-26 LAB — CK: Total CK: 57 U/L (ref 7–177)

## 2013-11-02 ENCOUNTER — Encounter: Payer: Self-pay | Admitting: Internal Medicine

## 2013-11-02 ENCOUNTER — Ambulatory Visit (INDEPENDENT_AMBULATORY_CARE_PROVIDER_SITE_OTHER): Payer: Medicare Other | Admitting: Internal Medicine

## 2013-11-02 VITALS — BP 158/82 | HR 72 | Temp 98.1°F | Ht 69.0 in | Wt 210.0 lb

## 2013-11-02 DIAGNOSIS — I10 Essential (primary) hypertension: Secondary | ICD-10-CM

## 2013-11-02 DIAGNOSIS — R7309 Other abnormal glucose: Secondary | ICD-10-CM

## 2013-11-02 NOTE — Assessment & Plan Note (Signed)
Patient wants to stop taking Bystolic.  She reports "it makes me feel bad".  She reports her home BP readings are normal.  Taper off beta blocker as directed.  She may have white coat hypertension.  She is also intolerant of multiple medications.  Continue to monitor BP as home.   Further weight loss and regular exercise encouraged.

## 2013-11-02 NOTE — Patient Instructions (Signed)
Please complete the following lab tests before your next follow up appointment: A1c, BMET - 790.29

## 2013-11-02 NOTE — Progress Notes (Signed)
Pre visit review using our clinic review tool, if applicable. No additional management support is needed unless otherwise documented below in the visit note. 

## 2013-11-02 NOTE — Assessment & Plan Note (Signed)
A1c improved through lifestyle changes.  Continue same therapy as she is reluctant to start any new medications.   Lab Results  Component Value Date   HGBA1C 6.3 10/26/2013   Monitor A1c.

## 2013-11-02 NOTE — Progress Notes (Signed)
Subjective:    Patient ID: [name], female    DOB: [birth year], 73 y.o.   MRN: [id number]  HPI  73 year old white female with history of mild hypertension, hyperlipidemia and abnormal glucose for routine followup. At previous visit patient was started on Bystolic. Patient has multiple intolerances to blood pressure medications.  Patient reports she took her last dose of Bystolic yesterday. She does not wish to continue blood pressure medication. She reports "I do not feel well" when she is taking medication.    Patient reports monitoring her blood pressure at home. Her systolic blood pressure usually in the 120s to low 130s in the low 80 diastolic.  Abnormal glucose-she has made some dietary modifications. Her A1c is improved.  Review of Systems Mild weight loss, no headache    Past Medical History  Diagnosis Date  . Hyperlipidemia   . Sjogren's disease   . Meningioma 11/2009    right post parietal 73mm - stable 11/2009 MRI  . Fibromyalgia   . GERD (gastroesophageal reflux disease)   . Hypertension   . Diverticulosis   . Arthritis     right thumb CMC arthritis  . Nephrolithiasis   . Complication of anesthesia     has dry mouth anyway from shograns-sore throats post op    History   Social History  . Marital Status: Divorced    Spouse Name: N/A    Number of Children: 2  . Years of Education: College   Occupational History  . Retired    Social History Main Topics  . Smoking status: Never Smoker   . Smokeless tobacco: Never Used  . Alcohol Use: Yes     Comment: Rarely  . Drug Use: No  . Sexual Activity: Not on file   Other Topics Concern  . Not on file   Social History Narrative   Pt lives at home alone.   Caffeine Use: 2 mugs every other day.     Past Surgical History  Procedure Laterality Date  . Vaginal hysterectomy  1989  . Kidney stone surgery  1974  . Mass excision  2008    abd-benign  . Appendectomy  2008  . Tonsillectomy    .  Colonoscopy    . Artery biopsy  02/03/2013    Procedure: MINOR BIOPSY TEMPORAL ARTERY;  Surgeon: Ascencion Dike, MD;  Location: [facility];  Service: ENT;;    Family History  Problem Relation Age of Onset  . Diabetes Neg Hx   . Stomach cancer Neg Hx   . Cancer Other     colon  . Colon cancer Mother 17  . Colon cancer Father     unknown when onset was    Allergies  Allergen Reactions  . Losartan Other (See Comments)    Causes somnolence  . Amoxicillin Rash  . Beta Adrenergic Blockers Diarrhea  . Clindamycin/Lincomycin Diarrhea and Rash    Current Outpatient Prescriptions on File Prior to Visit  Medication Sig Dispense Refill  . Calcium Carbonate-Vitamin D (CALCIUM + D) 600-200 MG-UNIT TABS Take 2 tablets by mouth daily.        . folic acid (FOLVITE) 1 MG tablet Take 1 mg by mouth daily.        . Glucosamine-Chondroit-Vit C-Mn (GLUCOSAMINE 1500 COMPLEX) CAPS Take 1,500 capsules by mouth 2 (two) times daily.        . nebivolol (BYSTOLIC) 5 MG tablet Take 1 tablet (5 mg total) by mouth daily.  30 tablet  2  . omeprazole (PRILOSEC) 20 MG capsule Take 1 capsule (20 mg total) by mouth daily.  90 capsule  3  . simvastatin (ZOCOR) 10 MG tablet Take 1 tablet (10 mg total) by mouth at bedtime.  90 tablet  1  . Specialty Vitamins Products (MAGNESIUM, AMINO ACID CHELATE,) 133 MG tablet Take 1 tablet by mouth daily.      . [DISCONTINUED] potassium chloride (KLOR-CON) 20 MEQ packet Take 20 mEq by mouth 2 (two) times daily.       No current facility-administered medications on file prior to visit.    BP 158/82  Pulse 72  Temp(Src) 98.1 F (36.7 C) (Oral)  Ht 5\' 9"  (1.753 m)  Wt 210 lb (95.255 kg)  BMI 31.00 kg/m2    Objective:   Physical Exam  Constitutional: She is oriented to person, place, and time. She appears well-developed and well-nourished. No distress.  Cardiovascular: Normal rate.   No murmur heard. Pulmonary/Chest: Effort normal and breath sounds normal. She  has no wheezes.  Neurological: She is alert and oriented to person, place, and time. No cranial nerve deficit.  Psychiatric: She has a normal mood and affect. Her behavior is normal.        Assessment & Plan:

## 2013-11-19 ENCOUNTER — Other Ambulatory Visit: Payer: Self-pay

## 2013-11-19 DIAGNOSIS — Z1231 Encounter for screening mammogram for malignant neoplasm of breast: Secondary | ICD-10-CM

## 2014-01-06 ENCOUNTER — Ambulatory Visit
Admission: RE | Admit: 2014-01-06 | Discharge: 2014-01-06 | Disposition: A | Discharge status: Home / Self Care | Payer: Medicare Other | Source: Ambulatory Visit

## 2014-01-06 ENCOUNTER — Encounter (INDEPENDENT_AMBULATORY_CARE_PROVIDER_SITE_OTHER): Payer: Self-pay

## 2014-01-06 DIAGNOSIS — Z1231 Encounter for screening mammogram for malignant neoplasm of breast: Secondary | ICD-10-CM

## 2014-01-07 ENCOUNTER — Other Ambulatory Visit: Payer: Self-pay | Admitting: Internal Medicine

## 2014-01-07 DIAGNOSIS — R928 Other abnormal and inconclusive findings on diagnostic imaging of breast: Secondary | ICD-10-CM

## 2014-01-22 ENCOUNTER — Ambulatory Visit
Admission: RE | Admit: 2014-01-22 | Discharge: 2014-01-22 | Disposition: A | Discharge status: Home / Self Care | Payer: Medicare Other | Source: Ambulatory Visit | Attending: Internal Medicine | Admitting: Internal Medicine

## 2014-01-22 DIAGNOSIS — R928 Other abnormal and inconclusive findings on diagnostic imaging of breast: Secondary | ICD-10-CM

## 2014-03-01 ENCOUNTER — Telehealth: Payer: Self-pay | Admitting: Internal Medicine

## 2014-03-01 MED ORDER — SIMVASTATIN 10 MG PO TABS: 10.0000 mg | ORAL_TABLET | Freq: Every day | ORAL | Status: DC

## 2014-03-01 NOTE — Telephone Encounter (Signed)
rx sent in electronically 

## 2014-03-01 NOTE — Telephone Encounter (Signed)
Pt is needing new rx simvastatin (ZOCOR) 10 MG tablet, sent to [facility] [address].

## 2014-03-08 ENCOUNTER — Telehealth: Payer: Self-pay | Admitting: Internal Medicine

## 2014-03-08 NOTE — Telephone Encounter (Signed)
Pt states she went to get her rx simvastatin (ZOCOR) 10 MG tablet, but was informed by her pharmacy that based on her insurance the rx can not be filled until 04/03/14, pt states [place] out [place] the [place] ([place] on 03[place]21[place]14. Pt wants to know if she can fill it on 03/21/14.

## 2014-03-09 NOTE — Telephone Encounter (Signed)
Called pharmacy and okayed early refill.  Pt informed she may have to pay out of pocket.  Explained that insurance only covers for every 30 days.  Pt verbalized understanding and had no questions.  She will follow up with [facility]

## 2014-06-17 ENCOUNTER — Telehealth: Payer: Self-pay

## 2014-06-17 NOTE — Telephone Encounter (Signed)
Spoke to pt and she is not interested in scheduling the AW[facility] for this year.

## 2014-06-21 ENCOUNTER — Other Ambulatory Visit: Payer: Self-pay | Admitting: Internal Medicine

## 2014-06-21 DIAGNOSIS — R921 Mammographic calcification found on diagnostic imaging of breast: Secondary | ICD-10-CM

## 2014-07-26 ENCOUNTER — Ambulatory Visit
Admission: RE | Admit: 2014-07-26 | Discharge: 2014-07-26 | Disposition: A | Discharge status: Home / Self Care | Payer: Medicare Other | Source: Ambulatory Visit | Attending: Internal Medicine | Admitting: Internal Medicine

## 2014-07-26 DIAGNOSIS — R921 Mammographic calcification found on diagnostic imaging of breast: Secondary | ICD-10-CM

## 2014-08-11 ENCOUNTER — Other Ambulatory Visit: Payer: Self-pay | Admitting: Internal Medicine

## 2014-08-11 DIAGNOSIS — R921 Mammographic calcification found on diagnostic imaging of breast: Secondary | ICD-10-CM

## 2014-12-14 ENCOUNTER — Other Ambulatory Visit: Payer: Self-pay

## 2014-12-14 DIAGNOSIS — Z1231 Encounter for screening mammogram for malignant neoplasm of breast: Secondary | ICD-10-CM

## 2015-01-10 ENCOUNTER — Ambulatory Visit
Admission: RE | Admit: 2015-01-10 | Discharge: 2015-01-10 | Disposition: A | Discharge status: Home / Self Care | Payer: PPO | Source: Ambulatory Visit

## 2015-01-10 DIAGNOSIS — Z1231 Encounter for screening mammogram for malignant neoplasm of breast: Secondary | ICD-10-CM

## 2015-04-12 ENCOUNTER — Other Ambulatory Visit (INDEPENDENT_AMBULATORY_CARE_PROVIDER_SITE_OTHER): Payer: Self-pay | Admitting: Otolaryngology

## 2015-04-12 DIAGNOSIS — K118 Other diseases of salivary glands: Secondary | ICD-10-CM

## 2015-04-18 ENCOUNTER — Ambulatory Visit
Admission: RE | Admit: 2015-04-18 | Discharge: 2015-04-18 | Disposition: A | Discharge status: Home / Self Care | Payer: PPO | Source: Ambulatory Visit | Attending: Otolaryngology | Admitting: Otolaryngology

## 2015-04-18 DIAGNOSIS — K118 Other diseases of salivary glands: Secondary | ICD-10-CM

## 2015-04-18 MED ORDER — IOPAMIDOL (ISOVUE-300) INJECTION 61%
75.0000 mL | Freq: Once | INTRAVENOUS | Status: AC | PRN
  Administered 2015-04-18: 75 mL via INTRAVENOUS

## 2015-07-08 ENCOUNTER — Other Ambulatory Visit: Payer: Self-pay | Admitting: Otolaryngology

## 2015-07-26 NOTE — Progress Notes (Addendum)
Anesthesia Note: Patient is a 74 year old female scheduled for right parotidectomy on 08/01/15 at 9:30 AM by Dr. Benjamine Mola. Case was moved from [facility] due to insurance reasons.  History includes non-smoker, HTN, HLD, Sjogren's disease (with dry mouth exacerbated by surgery/anesthesia), right parieto-occipital meningioma (13 X 16 X 18 mm 01/28/13; stable partial view on 04/18/15 CT), fibromyalgia, nephrolithiasis, diverticulosis, GERD, hysterectomy, appendectomy, temporal artery biopsy '14, appendectomy 04/25/06. PCP is listed as Dr. Kerman Passey with [facility]. She had been on Bystolic at that time for HTN, but was tapered off due to side effects. Elevated glucose improved with dietary changes. She is not routinely followed by cardiology, but was seen by Dr. Stanford Breed in 2013 for chest pain and palpitations and had a normal stress echo. Has seen neurosurgeon Dr. Vertell Limber.  Med list needs to be updated before her surgery: As of [id number] meds include folic acid, folvite, Prilosec, Zocor, speciality vitamin (magnesium, amino acid, chelate).    01/04/12 Stress Echo: Study Conclusions Stress ECG conclusions: The stress ECG was normal. Impressions: - Normal study after maximal exercise.  Currently, last EKG tracing in Epic from 03/13/12 showed NSR, cannot rule out inferior infarct (age undetermined).  04/18/15 CT neck: IMPRESSION: - Heterogeneous parotid glands with fatty replacement. - 3 x 3.2 x 3.7 cm heterogeneous predominantly cystic mass within the mid to the inferior aspect of the right parotid gland (involving superficial and deep lobe) containing septations. - Sub cm low-density/cystic structures within fatty replaced left parotid gland. - Parotid gland findings are suggestive of end-stage cystic changes of Sjogren's disease. As more worrisome parotid mass cannot be entirely excluded, stability over time will need to be confirmed with next follow-up neck CT in 6 months (sooner if clinically  indicated). At that time, attention to T5 vertebral body nonspecific lesion, possible residual thymic tissue superior anterior mediastinum and left upper lobe minimal hazy parenchymal changes recommended. - Small portion of the right parietal-occipital lobe calcified meningioma is visualized and without significant change from prior MR (01/28/2013). - Atherosclerotic type changes without evidence the hemodynamically significant stenosis as noted above.  She is for EKG, labs at her PAT appointment scheduled for 07/28/15 at 3 PM. Dr. Deeann Saint office also requested that I call patient due to "anesthesia" concerns. I did review her 04/25/06 anesthesia records from [facility] that indicated easy mask, anterior, intubated esophagus and noted immediately. Dr. Winfred Leeds used Miller blade to insert 7.5 ETT.  She will need GETA for this procedure. I will attempt to reach patient prior to her appointment on Thursday.  George Hugh [facility] Short Stay Center/Anesthesiology Phone [phone] 07/26/2015 6:04 PM  Addendum: I was unsuccessful at reaching patient X 2 yesterday. Was planning to talk with her at PAT this afternoon, but she was a no show because case is now rescheduled back at [facility]. I was able to talk with her briefly by phone this afternoon to inquire about her anesthesia concerns. She said that after her 04/25/06 surgery her throat burned like "fire." Her Sjogren's disease was likely a contributing factor. She wants to speak with the anesthesiologist doing her case to see what can be done to help/prevent this. I told her that I was not sure which anesthesiologist would be assigned to her but that I would forward this information to [facility] for them to review.   George Hugh [facility] Short Stay Center/Anesthesiology Phone [phone] 07/28/2015 4:29 PM

## 2015-07-28 ENCOUNTER — Encounter (HOSPITAL_COMMUNITY): Payer: Self-pay | Admitting: *Deleted

## 2015-07-28 ENCOUNTER — Inpatient Hospital Stay (HOSPITAL_COMMUNITY)
Admission: RE | Admit: 2015-07-28 | Discharge: 2015-07-28 | Disposition: A | Discharge status: Home / Self Care | Payer: PPO | Source: Ambulatory Visit

## 2015-07-28 ENCOUNTER — Encounter: Payer: Self-pay | Admitting: Gastroenterology

## 2015-07-28 NOTE — Pre-Procedure Instructions (Signed)
    LONA SNIFFEN  07/28/2015      [id number] - [place], [place] - [address] AT [facility] OF [address] & [address] [place] [address] Phone: [phone] Fax: [place] [phone] - [place], [place] [address] AT [facility] OF [address] [place] [address] Phone: [phone] Fax: [phone]    Your procedure is scheduled on 08/01/15.  Report to [facility] Admitting at 8 A.M.  Call this number if you have problems the morning of surgery:  [phone]   Remember:  Do not eat food or drink liquids after midnight.  Take these medicines the morning of surgery with A SIP OF WATER --none   Do not wear jewelry, make-up or nail polish.  Do not wear lotions, powders, or perfumes.  You may wear deodorant.  Do not shave 48 hours prior to surgery.  Men may shave face and neck.  Do not bring valuables to the hospital.  [facility] is not responsible for any belongings or valuables.  Contacts, dentures or bridgework may not be worn into surgery.  Leave your suitcase in the car.  After surgery it may be brought to your room.  For patients admitted to the hospital, discharge time will be determined by your treatment team.  Patients discharged the day of surgery will not be allowed to drive home.   Name and phone number of your driver:   Special instructions:    Please read over the following fact sheets that you were given. Pain Booklet, Coughing and Deep Breathing and Surgical Site Infection Prevention

## 2015-07-29 NOTE — Progress Notes (Signed)
All notes reviewed by Dr Myrtie Soman who called patient (per her request) and answered questions. She will speak to the Anesthesiologist day of surgery. Dr Kalman Shan suggests Decadron and LTA as part of the Anesthesia plan.

## 2015-08-01 ENCOUNTER — Ambulatory Visit (HOSPITAL_BASED_OUTPATIENT_CLINIC_OR_DEPARTMENT_OTHER): Payer: PPO | Admitting: Vascular Surgery

## 2015-08-01 ENCOUNTER — Encounter (HOSPITAL_BASED_OUTPATIENT_CLINIC_OR_DEPARTMENT_OTHER): Payer: Self-pay | Admitting: Anesthesiology

## 2015-08-01 ENCOUNTER — Ambulatory Visit (HOSPITAL_BASED_OUTPATIENT_CLINIC_OR_DEPARTMENT_OTHER)
Admission: RE | Admit: 2015-08-01 | Discharge: 2015-08-02 | Disposition: A | Discharge status: Home / Self Care | Payer: PPO | Source: Ambulatory Visit | Attending: Otolaryngology | Admitting: Otolaryngology

## 2015-08-01 ENCOUNTER — Encounter (HOSPITAL_BASED_OUTPATIENT_CLINIC_OR_DEPARTMENT_OTHER)
Admission: RE | Disposition: A | Discharge status: Home / Self Care | Payer: Self-pay | Source: Ambulatory Visit | Attending: Otolaryngology

## 2015-08-01 DIAGNOSIS — Z79899 Other long term (current) drug therapy: Secondary | ICD-10-CM | POA: Insufficient documentation

## 2015-08-01 DIAGNOSIS — K116 Mucocele of salivary gland: Secondary | ICD-10-CM | POA: Diagnosis not present

## 2015-08-01 DIAGNOSIS — M3500 Sicca syndrome, unspecified: Secondary | ICD-10-CM | POA: Insufficient documentation

## 2015-08-01 DIAGNOSIS — R221 Localized swelling, mass and lump, neck: Secondary | ICD-10-CM | POA: Diagnosis present

## 2015-08-01 DIAGNOSIS — Z9889 Other specified postprocedural states: Secondary | ICD-10-CM

## 2015-08-01 HISTORY — PX: PAROTIDECTOMY: SHX2163

## 2015-08-01 SURGERY — EXCISION, PAROTID GLAND
Anesthesia: General | Site: Neck | Laterality: Right

## 2015-08-01 MED ORDER — ONDANSETRON HCL 4 MG/2ML IJ SOLN: 4.0000 mg | INTRAMUSCULAR | Status: DC | PRN

## 2015-08-01 MED ORDER — GLYCOPYRROLATE 0.2 MG/ML IJ SOLN: 0.2000 mg | Freq: Once | INTRAMUSCULAR | Status: DC | PRN

## 2015-08-01 MED ORDER — ARTIFICIAL TEARS OP OINT
TOPICAL_OINTMENT | OPHTHALMIC | Status: AC
  Filled 2015-08-01: qty 3.5

## 2015-08-01 MED ORDER — DEXAMETHASONE SODIUM PHOSPHATE 4 MG/ML IJ SOLN
INTRAMUSCULAR | Status: DC | PRN
  Administered 2015-08-01: 10 mg via INTRAVENOUS

## 2015-08-01 MED ORDER — FENTANYL CITRATE (PF) 100 MCG/2ML IJ SOLN: 25.0000 ug | INTRAMUSCULAR | Status: DC | PRN

## 2015-08-01 MED ORDER — PROPOFOL 10 MG/ML IV BOLUS
INTRAVENOUS | Status: DC | PRN
  Administered 2015-08-01: 200 mg via INTRAVENOUS

## 2015-08-01 MED ORDER — EPHEDRINE SULFATE 50 MG/ML IJ SOLN
INTRAMUSCULAR | Status: DC | PRN
  Administered 2015-08-01: 10 mg via INTRAVENOUS

## 2015-08-01 MED ORDER — SIMVASTATIN 10 MG PO TABS: 10.0000 mg | ORAL_TABLET | Freq: Every day | ORAL | Status: DC

## 2015-08-01 MED ORDER — OXYCODONE-ACETAMINOPHEN 5-325 MG PO TABS
1.0000 | ORAL_TABLET | ORAL | Status: DC | PRN
  Administered 2015-08-01: 1 via ORAL
  Filled 2015-08-01: qty 1

## 2015-08-01 MED ORDER — PHENYLEPHRINE HCL 10 MG/ML IJ SOLN
INTRAMUSCULAR | Status: DC | PRN
  Administered 2015-08-01 (×3): 40 ug via INTRAVENOUS
  Administered 2015-08-01: 80 ug via INTRAVENOUS
  Administered 2015-08-01: 40 ug via INTRAVENOUS
  Administered 2015-08-01: 80 ug via INTRAVENOUS
  Administered 2015-08-01: 40 ug via INTRAVENOUS

## 2015-08-01 MED ORDER — LIDOCAINE-EPINEPHRINE 1 %-1:100000 IJ SOLN
INTRAMUSCULAR | Status: AC
  Filled 2015-08-01: qty 1

## 2015-08-01 MED ORDER — SUCCINYLCHOLINE CHLORIDE 20 MG/ML IJ SOLN
INTRAMUSCULAR | Status: DC | PRN
  Administered 2015-08-01: 100 mg via INTRAVENOUS

## 2015-08-01 MED ORDER — PROMETHAZINE HCL 25 MG/ML IJ SOLN: 6.2500 mg | INTRAMUSCULAR | Status: DC | PRN

## 2015-08-01 MED ORDER — EPHEDRINE SULFATE 50 MG/ML IJ SOLN
INTRAMUSCULAR | Status: AC
  Filled 2015-08-01: qty 1

## 2015-08-01 MED ORDER — ONDANSETRON HCL 4 MG PO TABS: 4.0000 mg | ORAL_TABLET | ORAL | Status: DC | PRN

## 2015-08-01 MED ORDER — OXYCODONE-ACETAMINOPHEN 5-325 MG PO TABS: 1.0000 | ORAL_TABLET | ORAL | Status: DC | PRN

## 2015-08-01 MED ORDER — ONDANSETRON HCL 4 MG/2ML IJ SOLN
INTRAMUSCULAR | Status: DC | PRN
Start: 2015-08-01 — End: 2015-08-01
  Administered 2015-08-01: 4 mg via INTRAVENOUS

## 2015-08-01 MED ORDER — PHENYLEPHRINE 40 MCG/ML (10ML) SYRINGE FOR IV PUSH (FOR BLOOD PRESSURE SUPPORT)
PREFILLED_SYRINGE | INTRAVENOUS | Status: AC
  Filled 2015-08-01: qty 10

## 2015-08-01 MED ORDER — BACITRACIN ZINC 500 UNIT/GM EX OINT
TOPICAL_OINTMENT | CUTANEOUS | Status: AC
  Filled 2015-08-01: qty 0.9

## 2015-08-01 MED ORDER — LIDOCAINE HCL (CARDIAC) 20 MG/ML IV SOLN
INTRAVENOUS | Status: DC | PRN
  Administered 2015-08-01: 50 mg via INTRAVENOUS

## 2015-08-01 MED ORDER — LACTATED RINGERS IV SOLN
INTRAVENOUS | Status: DC
  Administered 2015-08-01 (×2): via INTRAVENOUS

## 2015-08-01 MED ORDER — CEFAZOLIN SODIUM 1 G IJ SOLR
INTRAMUSCULAR | Status: AC
  Filled 2015-08-01: qty 20

## 2015-08-01 MED ORDER — FENTANYL CITRATE (PF) 100 MCG/2ML IJ SOLN
INTRAMUSCULAR | Status: AC
  Filled 2015-08-01: qty 2

## 2015-08-01 MED ORDER — LIDOCAINE HCL (CARDIAC) 20 MG/ML IV SOLN
INTRAVENOUS | Status: AC
  Filled 2015-08-01: qty 5

## 2015-08-01 MED ORDER — FENTANYL CITRATE (PF) 100 MCG/2ML IJ SOLN
50.0000 ug | INTRAMUSCULAR | Status: DC | PRN
  Administered 2015-08-01 (×2): 50 ug via INTRAVENOUS

## 2015-08-01 MED ORDER — KCL IN DEXTROSE-NACL 20-5-0.45 MEQ/L-%-% IV SOLN
INTRAVENOUS | Status: DC
  Administered 2015-08-01: 15:00:00 via INTRAVENOUS
  Filled 2015-08-01: qty 1000

## 2015-08-01 MED ORDER — CEFAZOLIN SODIUM-DEXTROSE 2-3 GM-% IV SOLR
INTRAVENOUS | Status: DC | PRN
  Administered 2015-08-01: 2 g via INTRAVENOUS

## 2015-08-01 MED ORDER — MORPHINE SULFATE (PF) 2 MG/ML IV SOLN
2.0000 mg | INTRAVENOUS | Status: DC | PRN
Start: 2015-08-01 — End: 2015-08-02

## 2015-08-01 MED ORDER — MIDAZOLAM HCL 2 MG/2ML IJ SOLN: 1.0000 mg | INTRAMUSCULAR | Status: DC | PRN

## 2015-08-01 MED ORDER — SCOPOLAMINE 1 MG/3DAYS TD PT72: 1.0000 | MEDICATED_PATCH | Freq: Once | TRANSDERMAL | Status: DC | PRN

## 2015-08-01 MED ORDER — LIDOCAINE-EPINEPHRINE 1 %-1:100000 IJ SOLN
INTRAMUSCULAR | Status: DC | PRN
  Administered 2015-08-01: 4 mL

## 2015-08-01 MED ORDER — CALCIUM CARBONATE-VITAMIN D 600-200 MG-UNIT PO TABS: 2.0000 | ORAL_TABLET | Freq: Every day | ORAL | Status: DC

## 2015-08-01 MED ORDER — ZOLPIDEM TARTRATE 5 MG PO TABS: 5.0000 mg | ORAL_TABLET | Freq: Every evening | ORAL | Status: DC | PRN

## 2015-08-01 SURGICAL SUPPLY — 62 items
ADH SKN CLS APL DERMABOND .7 (GAUZE/BANDAGES/DRESSINGS) ×1
APL SRG 3 HI ABS STRL LF PLS (MISCELLANEOUS) ×1
APPLICATOR DR MATTHEWS STRL (MISCELLANEOUS) ×3 IMPLANT
ATTRACTOMAT 16X20 MAGNETIC DRP (DRAPES) IMPLANT
BALL CTTN LRG ABS STRL LF (GAUZE/BANDAGES/DRESSINGS) ×1
BLADE SURG 15 STRL LF DISP TIS (BLADE) ×1 IMPLANT
BLADE SURG 15 STRL SS (BLADE) ×3
CANISTER SUCT 1200ML W/VALVE (MISCELLANEOUS) ×3 IMPLANT
CORDS BIPOLAR (ELECTRODE) ×3 IMPLANT
COTTONBALL LRG STERILE PKG (GAUZE/BANDAGES/DRESSINGS) ×3 IMPLANT
COVER BACK TABLE 60X90IN (DRAPES) ×3 IMPLANT
COVER MAYO STAND STRL (DRAPES) ×3 IMPLANT
DECANTER SPIKE VIAL GLASS SM (MISCELLANEOUS) ×1 IMPLANT
DERMABOND ADVANCED (GAUZE/BANDAGES/DRESSINGS) ×2
DERMABOND ADVANCED .7 DNX12 (GAUZE/BANDAGES/DRESSINGS) ×1 IMPLANT
DRAIN CHANNEL 10F 3/8 F FF (DRAIN) ×2 IMPLANT
DRAPE SURG 17X23 STRL (DRAPES) ×2 IMPLANT
DRAPE U-SHAPE 76X120 STRL (DRAPES) ×3 IMPLANT
ELECT COATED BLADE 2.86 ST (ELECTRODE) ×3 IMPLANT
ELECT PAIRED SUBDERMAL (MISCELLANEOUS) ×3
ELECT REM PT RETURN 9FT ADLT (ELECTROSURGICAL) ×3
ELECTRODE PAIRED SUBDERMAL (MISCELLANEOUS) ×1 IMPLANT
ELECTRODE REM PT RTRN 9FT ADLT (ELECTROSURGICAL) ×1 IMPLANT
EVACUATOR SILICONE 100CC (DRAIN) ×2 IMPLANT
FORCEPS BIPOLAR SPETZLER 8 1.0 (NEUROSURGERY SUPPLIES) ×3 IMPLANT
GAUZE SPONGE 4X4 16PLY XRAY LF (GAUZE/BANDAGES/DRESSINGS) ×5 IMPLANT
GLOVE BIO SURGEON STRL SZ 6.5 (GLOVE) ×1 IMPLANT
GLOVE BIO SURGEON STRL SZ7.5 (GLOVE) ×3 IMPLANT
GLOVE BIO SURGEONS STRL SZ 6.5 (GLOVE) ×1
GLOVE SURG SS PI 7.0 STRL IVOR (GLOVE) ×2 IMPLANT
GOWN STRL REUS W/ TWL LRG LVL3 (GOWN DISPOSABLE) ×2 IMPLANT
GOWN STRL REUS W/TWL LRG LVL3 (GOWN DISPOSABLE) ×9
LIQUID BAND (GAUZE/BANDAGES/DRESSINGS) ×2 IMPLANT
LOCATOR NERVE 3 VOLT (DISPOSABLE) IMPLANT
NDL HYPO 25X1 1.5 SAFETY (NEEDLE) ×1 IMPLANT
NEEDLE HYPO 25X1 1.5 SAFETY (NEEDLE) ×3 IMPLANT
NS IRRIG 1000ML POUR BTL (IV SOLUTION) ×3 IMPLANT
PACK BASIN DAY SURGERY FS (CUSTOM PROCEDURE TRAY) ×3 IMPLANT
PAD ALCOHOL SWAB (MISCELLANEOUS) ×6 IMPLANT
PENCIL BUTTON HOLSTER BLD 10FT (ELECTRODE) ×3 IMPLANT
PIN SAFETY STERILE (MISCELLANEOUS) IMPLANT
PROBE NERVBE PRASS .33 (MISCELLANEOUS) ×3 IMPLANT
SHEARS HARMONIC 9CM CVD (BLADE) ×3 IMPLANT
SLEEVE SCD COMPRESS KNEE MED (MISCELLANEOUS) ×3 IMPLANT
SPONGE INTESTINAL PEANUT (DISPOSABLE) ×5 IMPLANT
STAPLER VISISTAT 35W (STAPLE) IMPLANT
SUT ETHILON 3 0 PS 1 (SUTURE) ×2 IMPLANT
SUT PROLENE 5 0 P 3 (SUTURE) ×3 IMPLANT
SUT SILK 2 0 FS (SUTURE) ×9 IMPLANT
SUT SILK 2 0 TIES 17X18 (SUTURE)
SUT SILK 2-0 18XBRD TIE BLK (SUTURE) IMPLANT
SUT SILK 3 0 TIES 17X18 (SUTURE) ×3
SUT SILK 3-0 18XBRD TIE BLK (SUTURE) ×1 IMPLANT
SUT SILK 4 0 TIES 17X18 (SUTURE) IMPLANT
SUT VIC AB 3-0 FS2 27 (SUTURE) IMPLANT
SUT VICRYL 4-0 PS2 18IN ABS (SUTURE) ×3 IMPLANT
SYR BULB 3OZ (MISCELLANEOUS) ×3 IMPLANT
SYR CONTROL 10ML LL (SYRINGE) ×3 IMPLANT
TOWEL OR 17X24 6PK STRL BLUE (TOWEL DISPOSABLE) ×5 IMPLANT
TRAY DSU PREP LF (CUSTOM PROCEDURE TRAY) ×3 IMPLANT
TUBE CONNECTING 20'X1/4 (TUBING) ×1
TUBE CONNECTING 20X1/4 (TUBING) ×2 IMPLANT

## 2015-08-01 NOTE — Anesthesia Procedure Notes (Signed)
Procedure Name: Intubation Date/Time: 08/01/2015 10:30 AM Performed by: Lieutenant Diego Pre-anesthesia Checklist: Patient identified, Emergency Drugs available, Suction available and Patient being monitored Patient Re-evaluated:Patient Re-evaluated prior to inductionOxygen Delivery Method: Circle System Utilized Preoxygenation: Pre-oxygenation with 100% oxygen Intubation Type: IV induction Ventilation: Mask ventilation without difficulty Laryngoscope Size: Miller and 2 Grade View: Grade II Tube type: Oral Tube size: 6.5 mm Number of attempts: 1 Airway Equipment and Method: Stylet,  Oral airway and LTA kit utilized Placement Confirmation: ETT inserted through vocal cords under direct vision,  positive ETCO2 and breath sounds checked- equal and bilateral Secured at: 24 cm Tube secured with: Tape Dental Injury: Teeth and Oropharynx as per pre-operative assessment

## 2015-08-01 NOTE — Anesthesia Preprocedure Evaluation (Addendum)
Anesthesia Evaluation  Patient identified by MRN, date of birth, ID band Patient awake    Airway Mallampati: II  TM Distance: >3 FB Neck ROM: Full    Dental no notable dental hx.    Pulmonary neg pulmonary ROS,    Pulmonary exam normal breath sounds clear to auscultation       Cardiovascular hypertension, Normal cardiovascular exam Rhythm:Regular Rate:Normal     Neuro/Psych  Neuromuscular disease    GI/Hepatic Neg liver ROS, GERD  ,  Endo/Other    Renal/GU Renal disease     Musculoskeletal  (+) Arthritis , Fibromyalgia -  Abdominal   Peds  Hematology   Anesthesia Other Findings   Reproductive/Obstetrics                            Anesthesia Physical Anesthesia Plan  ASA: III  Anesthesia Plan: General   Post-op Pain Management:    Induction:   Airway Management Planned: Oral ETT  Additional Equipment:   Intra-op Plan:   Post-operative Plan: Extubation in OR  Informed Consent: I have reviewed the patients History and Physical, chart, labs and discussed the procedure including the risks, benefits and alternatives for the proposed anesthesia with the patient or authorized representative who has indicated his/her understanding and acceptance.   Dental advisory given  Plan Discussed with: CRNA and Anesthesiologist  Anesthesia Plan Comments:         Anesthesia Quick Evaluation

## 2015-08-01 NOTE — H&P (Signed)
Cc: Right parotid mass  HPI: The patient is a 74 year old female who presents today complaining of a right parotid mass. She also complains of increasing back pain over the past 2-3 weeks.  The patient was last seen 2 months ago. At that time, she was noted to have a 4 cm cystic mass at the tail of her right parotid gland.  The patient has a history of Sjgren's disease.  On her CT scan, a 3.7 cm heterogeneous, predominantly cystic mass was noted within her right parotid gland.  The findings were suggestive of endstage cystic changes of Sjgren's disease.  In addition, her CT scan also showed a nonspecific lesion within the T5 vertebral body.  A follow-up CT scan in 6 months was recommended by the radiologist. The patient returns complaining of increasing back pain over the past 2-3 weeks.  She has difficulty standing straight. She also has concern regarding her parotid lesion.  Currently she denies any facial pain, dysphagia, odynophagia, or dyspnea.  She continues to have significant xerostomia.  No other ENT, GI, or respiratory issue noted since the last visit.   Exam General: Communicates without difficulty, well nourished, no acute distress.  Head: Normocephalic, no evidence injury, no tenderness, facial buttresses intact without stepoff.  Eyes: PERRL, EOMI. No scleral icterus, conjunctivae clear.  Neuro: CN II exam reveals vision grossly intact.  No nystagmus at any point of gaze.  Ears: Auricles well formed without lesions.  Ear canals are intact without mass or lesion.  No erythema or edema is appreciated.  The TMs are intact without fluid.  Nose: External evaluation reveals normal support and skin without lesions.  Dorsum is intact.  Anterior rhinoscopy reveals healthy pink mucosa over anterior aspect of inferior turbinates and intact septum.  No purulence noted.  Oral:  Oral cavity and oropharynx are intact, symmetric, without erythema or edema.  Mucosa is moist without lesions.  Neck: Full range of  motion without pain.  There is no significant lymphadenopathy.  A 4 cm cystic mass is noted at the right tail of the parotid.  No skin erythema or drainage is noted.  Trachea is midline.  Neuro:  CN 2-12 grossly intact. Gait normal.  Vestibular: No nystagmus at any point of gaze.  The cerebellar examination is unremarkable.  Assessment 1.  The patient is noted to have a stable 4 cm cystic mass at the tail of her right parotid gland. The physical exam findings are similar to her previous visit 2 months ago.  2.  It is unclear if her back pain is related to her T5 lesion seen on her CT scan.    Plan  1.  The physical exam and CT findings are again discussed extensively with the patient.  The treatment options are discussed, including conservative observation versus surgical excision of her parotid cyst. The pros and cons of the treatment options are reviewed. The patient reports a bad experience at her last surgery.  She experienced severe throat pain after her last surgery.  She suspects her severe throat pain was a result of her xerostomia and intubation trauma.  2.  The patient will follow up with Dr. Erline Levine, a neurosurgeon, to evaluate her back pain and her T5 spine lesion.  5.  The patient has decided to proceed with surgical excision of the parotid mass.

## 2015-08-01 NOTE — Anesthesia Postprocedure Evaluation (Signed)
Anesthesia Post Note  Patient: [name]  Procedure(s) Performed: Procedure(s) (LRB): RIGHT PAROTIDECTOMY (Right)  Patient location during evaluation: PACU Anesthesia Type: General Level of consciousness: awake and alert and patient cooperative Pain management: pain level controlled Vital Signs Assessment: post-procedure vital signs reviewed and stable Respiratory status: spontaneous breathing and respiratory function stable Cardiovascular status: stable Anesthetic complications: no    Last Vitals:  Filed Vitals:   08/01/15 1400 08/01/15 1415  BP:  151/79  Pulse: 99 102  Temp:  36.6 C  Resp: 16 16    Last Pain:  Filed Vitals:   08/01/15 1432  PainSc: 0-No pain                 Joice Nazario S

## 2015-08-01 NOTE — Transfer of Care (Signed)
Immediate Anesthesia Transfer of Care Note  Patient: [name]  Procedure(s) Performed: Procedure(s): RIGHT PAROTIDECTOMY (Right)  Patient Location: PACU  Anesthesia Type:General  Level of Consciousness: awake and patient cooperative  Airway & Oxygen Therapy: Patient Spontanous Breathing and Patient connected to face mask oxygen  Post-op Assessment: Report given to RN and Post -op Vital signs reviewed and stable  Post vital signs: Reviewed and stable  Last Vitals:  Filed Vitals:   08/01/15 0918 08/01/15 1304  BP: 156/66   Pulse: 91 95  Temp: 37.1 C   Resp: 20 16    Complications: No apparent anesthesia complications

## 2015-08-01 NOTE — Op Note (Signed)
DATE OF PROCEDURE:  08/01/2015                              OPERATIVE REPORT  SURGEON:  [name], MD  ASSISTANT: Rometta Emery, PA-C  PREOPERATIVE DIAGNOSES: 1. Right parotid mass  POSTOPERATIVE DIAGNOSES: 1. Right parotid mass  PROCEDURE PERFORMED: Right lateral parotidectomy with facial nerve dissection  ANESTHESIA:  General endotracheal tube anesthesia.  COMPLICATIONS:  None.  ESTIMATED BLOOD LOSS:  74ml  INDICATION FOR PROCEDURE:  [name] is a 74 y.o. female with a history of a large 4 cm cystic mass at the tail of the right parotid gland. The patient has a history of Sjogren's disease. On her CT scan, a 3.7 cm heterogeneous, predominantly cystic mass was noted within her right parotid gland. The findings were suggestive of end-stage cystic changes of Sjogren's disease. Due to the large size of the mass, the patient would like to have the mass removed. The risks, benefits, and details of the parotidectomy surgery were discussed with the patient. The risks to facial nerve was also discussed. The patient would like to proceed with the procedure.  Questions were invited and answered.  Informed consent was obtained.  DESCRIPTION:  The patient was taken to the operating room and placed supine on the operating table.  General endotracheal tube anesthesia was administered by the anesthesiologist.  The patient was positioned and prepped and draped in a standard fashion for  right parotidectomy surgery. Facial nerve monitoring electrodes were placed. The facial nerve monitoring system was functional throughout the case.    1% lidocaine with 1-100,000 epinephrine was infiltrated at the planned site of incision. A standard right lateral parotidectomy incision was made. The incision was carried down to the level of the SMAS layer. The skin flap was elevated in a standard fashion. A large nearly 4 cm cystic mass was noted within the right parotid gland. Careful dissection was performed  anterior to the right auricular cartilage and a right lateral neck. The main trunk of the facial nerve was identified. The facial nerve was carefully dissected free from the surrounding parotid tissue. All branches of the facial nerves were identified and preserved. The facial nerve bramches were functional throughout the case. It should be noted that a large portion of the superior branch of the facial nerve was incorporated within the wall of the cystic mass. As a result, a small layer of the cystic wall was left behind with the superior branch of the facial nerve. The rest of the cystic mass was removed together with the superficial lobe of the parotid. The specimen was sent fresh to the pathology department. The surgical site was copiously irrigated. A #10 JP drain was placed. The incision was closed in layers with 4-0 Vicryl, 5-0 Prolene, and Dermabond.   The care of the patient was turned over to the anesthesiologist.  The patient was awakened from anesthesia without difficulty.  She was extubated and transferred to the recovery room in good condition.  OPERATIVE FINDINGS:  Right parotid mass/cyst  SPECIMEN:  Right lateral parotid lobe.  FOLLOWUP CARE:  The patient will be observed overnight.  The patient will follow up in my office in approximately 1 week.  Ascencion Dike 08/01/2015 12:37 PM

## 2015-08-02 ENCOUNTER — Encounter (HOSPITAL_BASED_OUTPATIENT_CLINIC_OR_DEPARTMENT_OTHER): Payer: Self-pay | Admitting: Otolaryngology

## 2015-08-02 NOTE — Discharge Summary (Signed)
Physician Discharge Summary  Patient ID: [name] MRN: [id number] DOB/AGE: [birth year] 74 y.o.  Admit date: 08/01/2015 Discharge date: 08/02/2015  Admission Diagnoses: Right parotid mass  Discharge Diagnoses: Right parotid mass  Active Problems:   H/O superficial parotidectomy   Discharged Condition: good  Hospital Course: Pt had an uneventful overnight stay. Pt tolerated po well. No bleeding. No stridor. CN7 intact  Consults: None  Significant Diagnostic Studies: none  Treatments: None  Discharge Exam: Blood pressure 132/69, pulse 88, temperature 97.2 F (36.2 C), temperature source Oral, resp. rate 18, height 5\' 9"  (1.753 m), weight 101.606 kg (224 lb), SpO2 96 %. Incision/Wound:c/d/i  Disposition: 01-Home or Self Care  Discharge Instructions    Activity as tolerated - No restrictions    Complete by:  As directed      Diet general    Complete by:  As directed             Medication List    TAKE these medications        Calcium Carbonate-Vitamin D 600-200 MG-UNIT Tabs  Take 2 tablets by mouth daily.     magnesium (amino acid chelate) 133 MG tablet  Take 1 tablet by mouth daily.     oxyCODONE-acetaminophen 5-325 MG tablet  Commonly known as:  ROXICET  Take 1 tablet by mouth every 4 (four) hours as needed for severe pain.     simvastatin 10 MG tablet  Commonly known as:  ZOCOR  Take 1 tablet (10 mg total) by mouth at bedtime.           Follow-up Information    Follow up with Ascencion Dike, MD On 08/08/2015.   Specialty:  Otolaryngology   Why:  at 2:40pm   Contact information:   [address] [place] [address] [phone]       Signed: Ascencion Dike 08/02/2015, 7:32 AM

## 2015-08-02 NOTE — Discharge Instructions (Addendum)
Parotidectomy, Care After Refer to this sheet in the next few weeks. These instructions provide you with information on caring for yourself after your procedure. Your caregiver may also give you more specific instructions. Your treatment has been planned according to current medical practices, but problems sometimes occur. Call your caregiver if you have any problems or questions after your procedure. HOME CARE INSTRUCTIONS Wound care  Usually, your bandage (dressing) should stay on the surgical cut (incision) for at least 2 days. Your caregiver will tell you when it is okay to take it off.  Keep your incision clean after the dressing is off.  Wash it with mild soap and water. Pat it dry with a clean towel. Do not rub the incision.  Check your incision every day to make sure that it is not red.  Do not take a shower or bath until your surgeon says it is okay. At first, take only sponge baths. You can probably shower about 1 day after the drain is out. You must keep the incision area dry.  Your stitches will be taken out after about 7 days. Pain  Some pain is normal after a parotidectomy. Take whatever pain medicine your surgeon prescribes. Follow the directions carefully.  Do not take over-the-counter pain medicine unless approved by your caregiver.Some painkillers, like aspirin, can cause bleeding. Diet  You can eat like you normally do once you are home. However, it might hurt to chew for a while. Stay away from foods that are hard to chew.  It may help to take your pain medicine about 30 minutes before you eat. Other precautions  Keep your head propped up when you lie down. Try using 2 pillows to do this. Do it for about 2 weeks after your surgery.  You can probably go back to your normal routine after a few days.However, do not do anything that requires great effort until your surgeon says it is okay. SEEK MEDICAL CARE IF:  You have any questions about your medicines.  Pain  does not go away, even after taking pain medicine.  You vomit or feel nauseous. SEEK IMMEDIATE MEDICAL CARE IF:   You are taking pain medicine but your pain gets much worse.  Yourincision looks red and swollen or blood or fluid leaks from the wound.  Skin on your ear or face gets red and swollen.  Your face is numb or feels weak.  You have a fever. MAKE SURE YOU:  Understand these instructions.  Will watch your condition.  Will get help right away if you are not doing well or get worse.   This information is not intended to replace advice given to you by your health care provider. Make sure you discuss any questions you have with your health care provider.   Document Released: 09/08/2010 Document Revised: 08/27/2014 Document Reviewed: 09/08/2010 Elsevier Interactive Patient Education [employer].

## 2015-08-21 DIAGNOSIS — J45909 Unspecified asthma, uncomplicated: Secondary | ICD-10-CM

## 2015-08-21 HISTORY — DX: Unspecified asthma, uncomplicated: J45.909

## 2015-08-25 ENCOUNTER — Telehealth: Payer: Self-pay | Admitting: Hematology and Oncology

## 2015-08-25 NOTE — Telephone Encounter (Signed)
CALLED PT LEFT VM IN REF TO NP APPT. °

## 2015-08-26 ENCOUNTER — Telehealth: Payer: Self-pay | Admitting: Hematology and Oncology

## 2015-08-26 NOTE — Telephone Encounter (Signed)
Pt aware of np appt on 08/31/15@3 :00. Per Dr. Alvy Bimler Dx-early non-hogkins b-cell lymphoma

## 2015-08-31 ENCOUNTER — Encounter: Payer: Self-pay | Admitting: Hematology and Oncology

## 2015-08-31 ENCOUNTER — Ambulatory Visit (HOSPITAL_BASED_OUTPATIENT_CLINIC_OR_DEPARTMENT_OTHER): Payer: PPO | Admitting: Hematology and Oncology

## 2015-08-31 ENCOUNTER — Telehealth: Payer: Self-pay | Admitting: Hematology and Oncology

## 2015-08-31 VITALS — BP 168/81 | HR 110 | Temp 98.6°F | Resp 19 | Ht 69.0 in | Wt 224.3 lb

## 2015-08-31 DIAGNOSIS — C8381 Other non-follicular lymphoma, lymph nodes of head, face, and neck: Secondary | ICD-10-CM

## 2015-08-31 DIAGNOSIS — Z85038 Personal history of other malignant neoplasm of large intestine: Secondary | ICD-10-CM

## 2015-08-31 DIAGNOSIS — M3500 Sicca syndrome, unspecified: Secondary | ICD-10-CM | POA: Diagnosis not present

## 2015-08-31 DIAGNOSIS — C8591 Non-Hodgkin lymphoma, unspecified, lymph nodes of head, face, and neck: Secondary | ICD-10-CM

## 2015-08-31 DIAGNOSIS — C8588 Other specified types of non-Hodgkin lymphoma, lymph nodes of multiple sites: Secondary | ICD-10-CM | POA: Insufficient documentation

## 2015-08-31 HISTORY — DX: Non-Hodgkin lymphoma, unspecified, lymph nodes of head, face, and neck: C85.91

## 2015-08-31 NOTE — Telephone Encounter (Signed)
Gv pt appt for 1/20 and advised her that c-sched will call her to schedule Pet scan.

## 2015-08-31 NOTE — Progress Notes (Signed)
[facility] NOTE  Patient Care Team: [name], MD as Consulting Physician (Otolaryngology) Maurice Small, MD as Consulting Physician (Family Medicine) Sable Feil, MD as Consulting Physician (Gastroenterology)  CHIEF COMPLAINTS/PURPOSE OF CONSULTATION:  Low-grade lymphoma  HISTORY OF PRESENTING ILLNESS:  [name] 75 y.o. female is here because of recent diagnosis of lymphoma. 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 She had surgical resection and surveillance colonoscopy. She also had long-term history of Sjogren's syndrome causing chronic dry eyes, dry mouth and arthritis. She used to follow with the rheumatologist but has not seen them for many years. Last year, she palpated a lump over the right parotid area and underwent evaluation. CT scan from August 2016 show a region of abnormalities within the parotid region. It showed 3 x 3.2 x 3.7 cm heterogeneous predominantly cystic mass within the mid to the inferior aspect of the right parotid gland (involving superficial and deep lobe) containing septations The patient waited a few months and subsequently underwent surgical resection on 08/01/2015. Initial pathology reported as a cyst, however further sample was subsequently sent for gene rearrangement study. Final report is as follows: ADDENDUM: Gene rearrangement for immunoglobulin heavy locus (IgH) was performed on a representative block at the [facility] in [place], [place]. 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 Clinical and hematologic evaluation and follow-up is strongly recommended. (BNS:kh  08-17-15)  She felt well after surgery. Her right face feels numb She continues to have dry eyes and dry mouth as well as arthritis in her hands. She also has left flank discomfort. She denies of adenopathy elsewhere. Denies abnormal weight loss, anorexia or skin itching. No night sweats.  MEDICAL HISTORY:  Past Medical History  Diagnosis Date  . Hyperlipidemia   . Sjogren's disease ([place])   . Meningioma ([place]) 11/2009    right post parietal 71m - stable 11/2009 MRI  . Fibromyalgia   . GERD (gastroesophageal reflux disease)   . Diverticulosis   . Arthritis     right thumb [facility] arthritis  . Nephrolithiasis   . Complication of anesthesia     has dry mouth anyway from shograns-sore throats post op  . [place]     lymphoma  . 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 Vaginal hysterectomy  1989  . Kidney stone surgery  1974  . Mass excision  2008    abd-benign  . Appendectomy  2008  . Tonsillectomy    . Colonoscopy    . Artery biopsy  02/03/2013    Procedure: MINOR BIOPSY TEMPORAL ARTERY;  Surgeon: SAscencion Dike MD;  [facility]  Service: ENT;;  . Parotidectomy Right 08/01/2015    Procedure: RIGHT PAROTIDECTOMY;  Surgeon: SLeta Baptist MD;  [facility]  Service: ENT;  Laterality: Right;  . Abdominal hysterectomy      SOCIAL HISTORY: Social History   Social History  . Marital Status: Divorced    Spouse Name: N/A  . Number of Children: 2  . Years of Education: College   Occupational History  . Retired    Social History Main Topics  . Smoking  status: Never Smoker   . Smokeless tobacco: Never Used  . Alcohol Use: Yes     Comment: Rarely  . Drug Use: No  . Sexual Activity: Not on file     Comment: [place] sister.   Other Topics Concern  . Not on file   Social History Narrative   Pt lives at home alone.    Caffeine Use: 2 mugs every other day.     FAMILY HISTORY: Family History  Problem Relation Age of Onset  . Diabetes Neg Hx   . Stomach cancer Neg Hx   . Cancer Other     colon  . Colon cancer Mother 109  . Colon cancer Father     unknown when onset was    ALLERGIES:  is allergic to losartan; amoxicillin; beta adrenergic blockers; and clindamycin/lincomycin.  MEDICATIONS:  Current Outpatient Prescriptions  Medication Sig Dispense Refill  . potassium chloride SA (K-DUR,KLOR-CON) 20 MEQ tablet Take 20 mEq by mouth 2 (two) times daily.    . Calcium Carbonate-Vitamin D (CALCIUM + D) 600-200 MG-UNIT TABS Take 2 tablets by mouth daily.      . simvastatin (ZOCOR) 10 MG tablet Take 1 tablet (10 mg total) by mouth at bedtime. 90 tablet 1  . Specialty Vitamins Products (MAGNESIUM, AMINO ACID CHELATE,) 133 MG tablet Take 1 tablet by mouth daily.    . [DISCONTINUED] potassium chloride (KLOR-CON) 20 MEQ packet Take 20 mEq by mouth 2 (two) times daily.     No current facility-administered medications for this visit.    REVIEW OF SYSTEMS:   Constitutional: Denies fevers, chills or abnormal night sweats Eyes: Denies blurriness of vision, double vision or watery eyes Ears, nose, mouth, throat, and face: Denies mucositis or sore throat Respiratory: Denies cough, dyspnea or wheezes Cardiovascular: Denies palpitation, chest discomfort or lower extremity swelling Gastrointestinal:  Denies nausea, heartburn or change in bowel habits Skin: Denies abnormal skin rashes Lymphatics: Denies new lymphadenopathy or easy bruising Neurological:Denies numbness, tingling or new weaknesses Behavioral/Psych: Mood is stable, no new changes  All other systems were reviewed with the patient and are negative.  PHYSICAL EXAMINATION: ECOG PERFORMANCE STATUS: 0 - Asymptomatic  Filed Vitals:   08/31/15 1509  BP: 168/81  Pulse: 110  Temp: 98.6 F (37 C)  Resp: 19   Filed Weights   08/31/15 1509  Weight: 224  lb 4.8 oz (101.742 kg)    GENERAL:alert, no distress and comfortable. She is morbidly obese SKIN: skin color, texture, turgor are normal, no rashes or significant lesions EYES: normal, conjunctiva are pink and non-injected, sclera clear OROPHARYNX:no exudate, no erythema and lips, buccal mucosa, and tongue normal  NECK: supple, thyroid normal size, non-tender, without nodularity. Noted postsurgical changes over the right parotid region LYMPH:  no palpable lymphadenopathy in the cervical, axillary or inguinal LUNGS: clear to auscultation and percussion with normal breathing effort HEART: regular rate & rhythm and no murmurs and no lower extremity edema ABDOMEN:abdomen soft, non-tender and normal bowel sounds Musculoskeletal:no cyanosis of digits and no clubbing  PSYCH: alert & oriented x 3 with fluent speech NEURO: no focal motor/sensory deficits  LABORATORY DATA:  I have reviewed the data as listed Lab Results  Component Value Date   WBC 5.7 05/01/2012   HGB 11.6* 05/01/2012   HCT 35.0* 05/01/2012   MCV 89.7 05/01/2012   PLT 215.0 05/01/2012   No results for input(s): NA, K, CL, CO2, GLUCOSE, BUN, CREATININE, CALCIUM, GFRNONAA, GFRAA, PROT, ALBUMIN, AST, ALT, ALKPHOS, BILITOT,  BILIDIR, IBILI in the last 8760 hours.  RADIOGRAPHIC STUDIES: I reviewed the CT scan from 04/18/2015 I have personally reviewed the radiological images as listed and agreed with the findings in the report.   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 I will order a PET CT scan and blood work. I will see her back after results are available to discuss whether she would need additional treatment beyond her recent surgical resection. 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 Her most recent colonoscopy were within normal limits. She does not require further adjuvant treatment.  SJOGREN'S SYNDROME She had history of Sjogren's with active symptoms of dry mouth, dry eyes and arthritis. She has not seen a rheumatologist for many years but I do recommend her to see a rheumatologist now in view of recent diagnosis of lymphoma and her active symptoms. She agreed for referral.     All questions were answered. The patient knows to call the clinic with any problems, questions or concerns. I spent 55 minutes counseling the patient face to face. The total time spent in the appointment was 60 minutes and more than 50% was on counseling.     [facility], [place], MD 08/31/2015 4:25 PM

## 2015-09-01 ENCOUNTER — Other Ambulatory Visit: Payer: Self-pay | Admitting: Hematology and Oncology

## 2015-09-01 NOTE — Assessment & Plan Note (Signed)
I discussed with her the importance of staging PET CT scan for lymphoma. I will order a PET CT scan and blood work. I will see her back after results are available to discuss whether she would need additional treatment beyond her recent surgical resection. We discussed briefly pathophysiology and behavior of low-grade lymphoma

## 2015-09-01 NOTE — Assessment & Plan Note (Signed)
She had history of Sjogren's with active symptoms of dry mouth, dry eyes and arthritis. She has not seen a rheumatologist for many years but I do recommend her to see a rheumatologist now in view of recent diagnosis of lymphoma and her active symptoms. She agreed for referral.

## 2015-09-01 NOTE — Assessment & Plan Note (Signed)
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 Her most recent colonoscopy were within normal limits. She does not require further adjuvant treatment. 

## 2015-09-05 ENCOUNTER — Telehealth: Payer: Self-pay | Admitting: Hematology and Oncology

## 2015-09-05 NOTE — Telephone Encounter (Signed)
Per 1/12 pof moved 1/20 lab/fu to 1/25. Left message for patient and mailed schedule.

## 2015-09-09 ENCOUNTER — Ambulatory Visit: Payer: PPO | Admitting: Hematology and Oncology

## 2015-09-09 ENCOUNTER — Other Ambulatory Visit: Payer: PPO

## 2015-09-12 ENCOUNTER — Ambulatory Visit (HOSPITAL_COMMUNITY): Payer: PPO

## 2015-09-13 ENCOUNTER — Encounter (HOSPITAL_COMMUNITY)
Admission: RE | Admit: 2015-09-13 | Discharge: 2015-09-13 | Disposition: A | Discharge status: Home / Self Care | Payer: PPO | Source: Ambulatory Visit | Attending: Hematology and Oncology | Admitting: Hematology and Oncology

## 2015-09-13 ENCOUNTER — Telehealth: Payer: Self-pay | Admitting: Hematology and Oncology

## 2015-09-13 ENCOUNTER — Telehealth: Payer: Self-pay | Admitting: *Deleted

## 2015-09-13 DIAGNOSIS — C8381 Other non-follicular lymphoma, lymph nodes of head, face, and neck: Secondary | ICD-10-CM | POA: Diagnosis not present

## 2015-09-13 DIAGNOSIS — Z0189 Encounter for other specified special examinations: Secondary | ICD-10-CM | POA: Insufficient documentation

## 2015-09-13 DIAGNOSIS — R911 Solitary pulmonary nodule: Secondary | ICD-10-CM | POA: Insufficient documentation

## 2015-09-13 DIAGNOSIS — C8590 Non-Hodgkin lymphoma, unspecified, unspecified site: Secondary | ICD-10-CM | POA: Diagnosis not present

## 2015-09-13 LAB — GLUCOSE, CAPILLARY: Glucose-Capillary: 119 mg/dL — ABNORMAL HIGH (ref 65–99)

## 2015-09-13 MED ORDER — FLUDEOXYGLUCOSE F - 18 (FDG) INJECTION
10.9300 | Freq: Once | INTRAVENOUS | Status: AC | PRN
  Administered 2015-09-13: 10.93 via INTRAVENOUS

## 2015-09-13 NOTE — Telephone Encounter (Signed)
Pt will come at 0930 tomorow

## 2015-09-13 NOTE — Telephone Encounter (Signed)
-----   Message from Heath Lark, MD sent at 09/13/2015  9:28 AM EST ----- Regarding: appt tomorrow I cancelled lab because it was not needed. Please let her know Can you call to see if she can come in at 9 or 930 to discuss PET results. If not, no problem. No need to move the appt if she can come in early  THanks

## 2015-09-13 NOTE — Telephone Encounter (Signed)
per pof to sch pt appt-*per pof pt aware of r/s time for 1/25

## 2015-09-14 ENCOUNTER — Telehealth: Payer: Self-pay | Admitting: Hematology and Oncology

## 2015-09-14 ENCOUNTER — Ambulatory Visit (HOSPITAL_BASED_OUTPATIENT_CLINIC_OR_DEPARTMENT_OTHER): Payer: PPO | Admitting: Hematology and Oncology

## 2015-09-14 ENCOUNTER — Encounter: Payer: Self-pay | Admitting: Hematology and Oncology

## 2015-09-14 ENCOUNTER — Ambulatory Visit: Payer: PPO | Admitting: Hematology and Oncology

## 2015-09-14 ENCOUNTER — Other Ambulatory Visit: Payer: PPO

## 2015-09-14 VITALS — BP 156/63 | HR 100 | Temp 98.4°F | Resp 19 | Wt 226.3 lb

## 2015-09-14 DIAGNOSIS — Z85038 Personal history of other malignant neoplasm of large intestine: Secondary | ICD-10-CM | POA: Diagnosis not present

## 2015-09-14 DIAGNOSIS — R918 Other nonspecific abnormal finding of lung field: Secondary | ICD-10-CM | POA: Diagnosis not present

## 2015-09-14 DIAGNOSIS — M3500 Sicca syndrome, unspecified: Secondary | ICD-10-CM | POA: Diagnosis not present

## 2015-09-14 DIAGNOSIS — R911 Solitary pulmonary nodule: Secondary | ICD-10-CM | POA: Insufficient documentation

## 2015-09-14 DIAGNOSIS — C8381 Other non-follicular lymphoma, lymph nodes of head, face, and neck: Secondary | ICD-10-CM

## 2015-09-14 DIAGNOSIS — I10 Essential (primary) hypertension: Secondary | ICD-10-CM

## 2015-09-14 HISTORY — DX: Other nonspecific abnormal finding of lung field: R91.8

## 2015-09-14 NOTE — Telephone Encounter (Signed)
Pt confirmed labs/ov per 01/25 POF, gave pt AVS and Calendar..... KJ °

## 2015-09-15 ENCOUNTER — Encounter: Payer: Self-pay | Admitting: Hematology and Oncology

## 2015-09-15 ENCOUNTER — Ambulatory Visit: Payer: PPO | Admitting: Hematology and Oncology

## 2015-09-15 DIAGNOSIS — I10 Essential (primary) hypertension: Secondary | ICD-10-CM | POA: Insufficient documentation

## 2015-09-15 NOTE — Assessment & Plan Note (Signed)
She had history of Sjogren's with active symptoms of dry mouth, dry eyes and arthritis. She has not seen a rheumatologist for many years but I do recommend her to see a rheumatologist now in view of recent diagnosis of lymphoma and her active symptoms. She agreed for referral.

## 2015-09-15 NOTE — Assessment & Plan Note (Signed)
PET CT scan showed nodules on both parotid area, cause unknown, could be related to active Sjogren's syndrome. Lymphoma cannot be excluded but the patient is not symptomatic. I reviewed the NCCN guidelines with the patient. Due to lack of symptoms, I recommend observation only. I will see her in the summer with repeat history, physical examination and blood work. I will order imaging study once a year only for the first 2 years and then discontinue surveillance imaging.

## 2015-09-15 NOTE — Progress Notes (Signed)
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 Please refer to report for other findings    INTERVAL HISTORY: Please see below for problem oriented charting. The patient returns for further management. She denies new symptoms. Her wound on the right side of the neck is healing well. She continues to have persistent dry mouth and complain of chronic cough  REVIEW OF SYSTEMS:   Constitutional: Denies fevers, chills or abnormal weight loss Eyes: Denies blurriness of vision Ears, nose, mouth, throat, and face: Denies mucositis or sore throat Cardiovascular: Denies palpitation, chest discomfort or lower extremity swelling Gastrointestinal:  Denies nausea, heartburn or change in bowel habits Skin: Denies abnormal skin rashes Lymphatics: Denies new lymphadenopathy or easy bruising Neurological:Denies numbness, tingling or new weaknesses Behavioral/Psych: Mood is stable, no new changes  All other systems were reviewed with the patient and are negative.  I have reviewed the past medical history, past surgical history, social history and family history with the patient and they are unchanged from previous note.  ALLERGIES:  is allergic to losartan; amoxicillin; beta adrenergic blockers; and clindamycin/lincomycin.  MEDICATIONS:  Current Outpatient  Prescriptions  Medication Sig Dispense Refill  . Calcium Carbonate-Vitamin D (CALCIUM + D) 600-200 MG-UNIT TABS Take 2 tablets by mouth daily.      Marland Kitchen POTASSIUM CHLORIDE PO Take by mouth daily. OTC pt does not know dose    . simvastatin (ZOCOR) 10 MG tablet Take 1 tablet (10 mg total) by mouth at bedtime. 90 tablet 1  . Specialty Vitamins Products (MAGNESIUM, AMINO ACID CHELATE,) 133 MG tablet Take 1 tablet by mouth daily.    . vitamin B-12 (CYANOCOBALAMIN) 1000 MCG tablet Take 1,000 mcg by mouth daily.     No current facility-administered medications for this visit.    PHYSICAL EXAMINATION: ECOG PERFORMANCE STATUS: 1 - Symptomatic but completely ambulatory  Filed Vitals:   09/14/15 0913  BP: 156/63  Pulse: 100  Temp: 98.4 F (36.9 C)  Resp: 19   Filed Weights   09/14/15 0913  Weight: 226 lb 4.8 oz (102.649 kg)    GENERAL:alert, no distress and comfortable SKIN: skin color, texture, turgor are normal, no rashes or significant lesions EYES: normal, Conjunctiva are pink and non-injected, sclera clear OROPHARYNX:no exudate, no erythema and lips, buccal mucosa, and tongue normal  NECK: supple, thyroid normal size, non-tender, without nodularity. Well healed scar Musculoskeletal:no cyanosis of digits and no clubbing  NEURO: alert & oriented x 3 with fluent speech, no focal motor/sensory deficits  LABORATORY DATA:  I have reviewed the data as listed    Component Value Date/Time   NA 139 10/26/2013 1013   K 4.4 10/26/2013 1013   CL 104 10/26/2013 1013   CO2 28 10/26/2013 1013   GLUCOSE 106* 10/26/2013 1013   BUN 14 10/26/2013 1013   CREATININE 0.7 10/26/2013 1013   CALCIUM 9.6 10/26/2013 1013   PROT 7.5 10/26/2013 1013  ALBUMIN 3.8 10/26/2013 1013   AST 17 10/26/2013 1013   ALT 16 10/26/2013 1013   ALKPHOS 75 10/26/2013 1013   BILITOT 0.4 10/26/2013 1013   GFRNONAA 84* 02/02/2013 1200   GFRAA >90 02/02/2013 1200    No results found for: SPEP, UPEP  Lab Results   Component Value Date   WBC 5.7 05/01/2012   NEUTROABS 4.0 05/01/2012   HGB 11.6* 05/01/2012   HCT 35.0* 05/01/2012   MCV 89.7 05/01/2012   PLT 215.0 05/01/2012      Chemistry      Component Value Date/Time   NA 139 10/26/2013 1013   K 4.4 10/26/2013 1013   CL 104 10/26/2013 1013   CO2 28 10/26/2013 1013   BUN 14 10/26/2013 1013   CREATININE 0.7 10/26/2013 1013      Component Value Date/Time   CALCIUM 9.6 10/26/2013 1013   ALKPHOS 75 10/26/2013 1013   AST 17 10/26/2013 1013   ALT 16 10/26/2013 1013   BILITOT 0.4 10/26/2013 1013       RADIOGRAPHIC STUDIES: I reviewed the imaging study with the patient I have personally reviewed the radiological images as listed and agreed with the findings in the report. Nm Pet Image Initial (pi) Skull Base To Thigh  09/13/2015  CLINICAL DATA:  Initial treatment strategy for non-Hodgkin's lymphoma of the RIGHT parotid gland. History of Sjogren's syndrome. EXAM: NUCLEAR MEDICINE PET SKULL BASE TO THIGH TECHNIQUE: 10.9 mCi F-18 FDG was injected intravenously. Full-ring PET imaging was performed from the skull base to thigh after the radiotracer. CT data was obtained and used for attenuation correction and anatomic localization. FASTING BLOOD GLUCOSE:  Value: 119 mg/dl COMPARISON:  Neck CT 04/18/2015 FINDINGS: NECK Small lymph nodes / nodules in the subcutaneous tissue anterior to the parotid glands and superficial to the muscle of mastication on the LEFT and RIGHT with mild metabolic activity. Nodularity along the RIGHT measures 11 mm on image 23, series 4 with SUV max equal 4.5. Similar activity on the LEFT. No additional discrete hypermetabolic lymph nodes neck. . CHEST No hypermetabolic mediastinal lymph nodes. No hypermetabolic axillary lymph nodes. There is a cystic nodular lesion in the RIGHT lower lobe measuring 19 mm x 15 mm (image 94, series 4). Medial to this lesion there is a is a focus of ground-glass opacity on image 96, series 4 with  metabolic activity (SUV max equal 4.6). There are  bilateral thin-walled cysts within both lungs. ABDOMEN/PELVIS No abnormal metabolic activity in the spleen which is normal size. No hypermetabolic abdominal pelvic lymph nodes. Low-attenuation within liver suggests hepatic steatosis. SKELETON Single focus of hypermetabolic activity associated with the pedicle / neural large on the RIGHT side of the T5 vertebral body (image 66, series 4). There is a suggestion of a a remote fracture healing at this level. The metabolic activity is moderate with SUV max equal 5.3. IMPRESSION: 1. No clear evidence of lymphoma on the whole-body scan. 2. Nodular lymphoid tissue in the subcutaneous tissue anterior to the LEFT and RIGHT parotid gland is symmetric. This may relate to patient's Sjogren's syndrome 3. Hypermetabolic focus of atelectasis atelectasis in the RIGHT lower lobe adjacent to a cystic and solid lesion is likely benign. There are multiple cystic lesions within the lungs. Recommend follow-up CT of the thorax with contrast in 3 months for re-evaluation cystic nodule in the RIGHT lower lobe. The cystic nodule is not hypermetabolic. 4. Focal metabolic activity associated with the right neural arch / pedicle of the T5  vertebral body with an apparent healed fracture at this level. Question a pathologic or traumatic fracture. Recommend clinical correlation for trauma. Electronically Signed   By: Suzy Bouchard M.D.   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 Her most recent colonoscopy were within normal limits. She does not require further adjuvant treatment. 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 Lymphoma cannot be excluded but the patient is  not symptomatic. I reviewed the NCCN guidelines with the patient. Due to lack of symptoms, I recommend observation only. I will see her in the summer with repeat history, physical examination and blood work. I will order imaging study once a year only for the first 2 years and then discontinue surveillance imaging.  SJOGREN'S SYNDROME She had history of Sjogren's with active symptoms of dry mouth, dry eyes and arthritis. She has not seen a rheumatologist for many years but I do recommend her to see a rheumatologist now in view of recent diagnosis of lymphoma and her active symptoms. She agreed for referral.    Lung infiltrate on CT She has nonspecific infiltrate on CT as she have chronic nonproductive cough. I discussed with her possibility of diagnosis of current autoimmune pneumonitis in the setting of autoimmune disorder. I will refer her to pulmonologist for further evaluation and management.  Essential hypertension She appears anxious. Her blood pressure measurement are elevated in both clinic visits. I would recommend close follow-up with primary care doctor and consideration for antihypertensive if her blood pressure remain elevated   Orders Placed This Encounter  Procedures  . Ambulatory referral to Pulmonology    Referral Priority:  Routine    Referral Type:  Consultation    Referral Reason:  Specialty Services Required    Referred to Provider:  Javier Glazier, MD    Requested Specialty:  Pulmonary Disease    Number of Visits Requested:  1   All questions were answered. The patient knows to call the clinic with any problems, questions or concerns. No barriers to learning was detected. I spent 30 minutes counseling the patient face to face. The total time spent in the appointment was 40 minutes and more than 50% was on counseling and review of test results     [facility], Jaelah Hauth, MD 09/15/2015 8:14 AM

## 2015-09-15 NOTE — Assessment & Plan Note (Signed)
She appears anxious. Her blood pressure measurement are elevated in both clinic visits. I would recommend close follow-up with primary care doctor and consideration for antihypertensive if her blood pressure remain elevated

## 2015-09-15 NOTE — Assessment & Plan Note (Signed)
She has nonspecific infiltrate on CT as she have chronic nonproductive cough. I discussed with her possibility of diagnosis of current autoimmune pneumonitis in the setting of autoimmune disorder. I will refer her to pulmonologist for further evaluation and management.

## 2015-09-15 NOTE — Assessment & Plan Note (Signed)
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 Her most recent colonoscopy were within normal limits. She does not require further adjuvant treatment. PET scan showed no disease

## 2015-09-21 ENCOUNTER — Encounter (HOSPITAL_COMMUNITY): Payer: Self-pay

## 2015-10-18 ENCOUNTER — Encounter: Payer: Self-pay | Admitting: Pulmonary Disease

## 2015-10-18 ENCOUNTER — Other Ambulatory Visit (INDEPENDENT_AMBULATORY_CARE_PROVIDER_SITE_OTHER): Payer: PPO

## 2015-10-18 ENCOUNTER — Ambulatory Visit (INDEPENDENT_AMBULATORY_CARE_PROVIDER_SITE_OTHER): Payer: PPO | Admitting: Pulmonary Disease

## 2015-10-18 ENCOUNTER — Telehealth: Payer: Self-pay | Admitting: Pulmonary Disease

## 2015-10-18 VITALS — BP 142/82 | HR 99 | Ht 69.0 in | Wt 223.2 lb

## 2015-10-18 DIAGNOSIS — M3500 Sicca syndrome, unspecified: Secondary | ICD-10-CM

## 2015-10-18 DIAGNOSIS — K219 Gastro-esophageal reflux disease without esophagitis: Secondary | ICD-10-CM | POA: Diagnosis not present

## 2015-10-18 DIAGNOSIS — R918 Other nonspecific abnormal finding of lung field: Secondary | ICD-10-CM

## 2015-10-18 DIAGNOSIS — J984 Other disorders of lung: Secondary | ICD-10-CM | POA: Diagnosis not present

## 2015-10-18 LAB — SEDIMENTATION RATE: Sed Rate: 53 mm/hr — ABNORMAL HIGH (ref 0–22)

## 2015-10-18 LAB — RHEUMATOID FACTOR: RHEUMATOID FACTOR: 51 [IU]/mL — AB (ref <=14)

## 2015-10-18 LAB — C-REACTIVE PROTEIN: CRP: 1 mg/dL (ref 0.5–20.0)

## 2015-10-18 NOTE — Telephone Encounter (Signed)
IMAGING PET CT 09/13/15 (personally reviewed by me): Radiology noted nodular lymphoid tissue in the subcutaneous tissue anterior to left and right parotid glands that was asymmetric. Hypermetabolic atelectasis right lower lobe adjacent to cystic and solid lesion. Cystic nodule is not hypermetabolic. Healed fracture at level TV vertebral body with associated hypermetabolic activity. On my review the patient's chest imaging there is no evidence of pleural thickening or effusion. No pathologic mediastinal adenopathy. No pericardial effusion. Cystic lesions are noted throughout both lungs in both the upper lung zones as well as the lower lung zones but most predominantly in the middle lung zones. Suspect the area of density and nodularity in the right lower lobe is possible adjacent consolidation with air bronchogram associated with one of the cysts. Area of atelectasis and hypermetabolic activity is medial in the right lower lobe and very minimal possibly represent inflammatory response from an infectious process and more consistent with an opacity rather than true atelectasis.  PATHOLOGY Right Parotid Gland (08/01/15):  Likely Early Non-Hodgkin's Lymphoma MALT type. IgH monoclonal proliferation on PCR.

## 2015-10-18 NOTE — Patient Instructions (Addendum)
   We will do a breathing and walking test to check your lung function on or before your next appointment.  I'm checking a swallowing test to see if reflux could be contributing to your cough.  We will be doing a special CT of your chest to take a better look at your lung tissue.  We will review your blood work and test results at your follow-up appointment. Our office will contact you if the results are significantly abnormal and need to be addressed sooner.  Call me if you have any new breathing problems or change in your cough before your next appointment.  I will see you back in 4-6 weeks.  TESTS ORDERED: 1. High-resolution CT scan of chest without contrast 2. 6 minute walk test on room air on her before next appointment 3. Full pulmonary function testing on her before next appointment 4. Barium swallow 5. Labs today

## 2015-10-18 NOTE — Progress Notes (Signed)
Subjective:    Patient ID: [name], female    DOB: [birth year], 75 y.o.   MRN: [id number]  HPI Patient referred for evaluation of her right lower lobe opacity with hypermetabolic activity on PET/CT imaging as well as bilateral lung cysts. Patient with known history of Sjogren's syndrome not currently on treatment as well as early non-Hodgkin's lymphoma of lymph nodes of the head not currently on treatment. 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 She reports in the 1980s she was diagnosed with Sjogrens because of dry eyes and dry mouth. She reports she started noticing a cough 4 months ago both during the day and during the night. She reports cough is nonproductive. She denies any improvement or worsening of cough. She denies any inciting factors such as perfumes or exertion. She denies any wheezing. She denies any dyspnea at rest or exertion. She reports she has noticed a heaviness in her chest with exertion lately. She reports she has gained weight over the last 2 years. She has been walking 30 minutes daily over the last 2 weeks and has been gradually increasing her exercise. She denies any fever, chills, or sweats. No rashes or abnormal bruising. Rare epistaxis. She still has dry mouth & dry eyes. Denies any oral ulcers. She describes an abnormal sensation with swallowing in her neck but denies any dysphagia or odynophagia. She reports dyspepsia that is intermittent but denies any reflux or morning brash water taste otherwise. She reports joint pain & swelling in her MCP joints bilaterally. She does have chronic shooting pain in her hands as well as back pain that she attributes to her fibromyalgia.  Review of Systems No dysuria or hematuria. She does reports some mild swelling in the lymph nodes in her neck. She denies any sinus congestion, pressure, or drainage. A pertinent 14 point review of systems is negative except as  per the history of presenting illness.  Allergies  Allergen Reactions  . Losartan Other (See Comments)    Causes somnolence  . Amoxicillin Rash  . Beta Adrenergic Blockers Diarrhea  . Clindamycin/Lincomycin Diarrhea and Rash    Current Outpatient Prescriptions on File Prior to Visit  Medication Sig Dispense Refill  . Calcium Carbonate-Vitamin D (CALCIUM + D) 600-200 MG-UNIT TABS Take 2 tablets by mouth daily.      Marland Kitchen POTASSIUM CHLORIDE PO Take by mouth daily. OTC pt does not know dose    . simvastatin (ZOCOR) 10 MG tablet Take 1 tablet (10 mg total) by mouth at bedtime. 90 tablet 1  . Specialty Vitamins Products (MAGNESIUM, AMINO ACID CHELATE,) 133 MG tablet Take 1 tablet by mouth daily.    . vitamin B-12 (CYANOCOBALAMIN) 1000 MCG tablet Take 1,000 mcg by mouth daily.     No current facility-administered medications on file prior to visit.    Past Medical History  Diagnosis Date  . Hyperlipidemia   . Sjogren's disease ([place])   . Meningioma ([place]) 11/2009    right post parietal 73m - stable 11/2009 MRI  . Fibromyalgia   . GERD (gastroesophageal reflux disease)   . Diverticulosis   . Arthritis     right thumb [facility] arthritis  . Nephrolithiasis   . Complication of anesthesia     has dry mouth anyway from shograns-sore throats post op  . Cancer ([facility]     lymphoma  . 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 Lung infiltrate on  CT 09/14/2015  . Hypertension   . Nephrolithiasis     Past Surgical History  Procedure Laterality Date  . Vaginal hysterectomy  1989  . Kidney stone surgery  1974  . Mass excision  2008    abd-benign  . Appendectomy  2008  . Tonsillectomy    . Colonoscopy    . Artery biopsy  02/03/2013    Procedure: MINOR BIOPSY TEMPORAL ARTERY;  Surgeon: Ascencion Dike, MD;  Location: [facility];  Service: ENT;;  . Parotidectomy Right 08/01/2015    Procedure: RIGHT PAROTIDECTOMY;  Surgeon: [name], MD;  Location: [facility];  Service: ENT;  Laterality: Right;  . Abdominal hysterectomy      Family History  Problem Relation Age of Onset  . Diabetes Neg Hx   . Stomach cancer Neg Hx   . Colon cancer Mother 73  . Colon cancer Father     unknown when onset was    Social History   Social History  . Marital Status: Divorced    Spouse Name: N/A  . Number of Children: 2  . Years of Education: College   Occupational History  . Retired    Social History Main Topics  . Smoking status: Passive Smoke Exposure - Never Smoker  . Smokeless tobacco: Never Used     Comment: Passive exposure through her mother.  . Alcohol Use: 0.0 oz/week    0 Standard drinks or equivalent per week     Comment: Rarely  . Drug Use: No  . Sexual Activity: Not Asked     Comment: Alvin sister.   Other Topics Concern  . None   Social History Narrative   Pt lives at home alone.   Caffeine Use: 2 mugs every other day.        Pulmonary:   Originally from [place], [place]. She has also lived in [place], [place], & [place]. Internationally she has been to [place], [place], [place], & [place]. Previously has worked in Press [employer]. No chemical or fume exposures. No pets currently. No bird, mold, or hot tub exposure. 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       Objective:   Physical Exam BP 142/82 mmHg  Pulse 99  Ht _0  (1.753 m)  Wt 223 lb 3.2 oz (101.243 kg)  BMI 32.95 kg/m2  SpO2 99% General:  Awake. Alert. No acute distress. Mild central obesity.  Integument:  Warm & dry. No rash on exposed skin. No bruising. Healed right neck incision. Lymphatics:  No appreciated cervical or supraclavicular lymphadenoapthy. HEENT:  Dry mucus membranes. No oral ulcers. No scleral injection or icterus. Minimal nasal turbinate swelling. Cardiovascular:  Regular rate. No edema. No appreciable JVD.  Pulmonary:  Good aeration & clear to auscultation bilaterally. Symmetric  chest wall expansion. No accessory muscle use on room air. Abdomen: Soft. Normal bowel sounds. Nondistended. Grossly nontender. Musculoskeletal:  Normal bulk and tone. Hand grip strength 5/5 bilaterally. Mild synovial thickening of PIP & MCP joints bilaterally but most pronounced in PIP joints. Neurological:  CN 2-12 grossly in tact. No meningismus. Moving all 4 extremities equally. Symmetric brachioradialis deep tendon reflexes. Psychiatric:  Mood and affect congruent. Speech normal rhythm, rate & tone.   IMAGING PET CT 09/13/15 (personally reviewed by me): Radiology noted nodular lymphoid tissue in the subcutaneous tissue anterior to left and right parotid glands that was asymmetric. Hypermetabolic atelectasis right lower lobe adjacent to cystic  and solid lesion. Cystic nodule is not hypermetabolic. Healed fracture at level TV vertebral body with associated hypermetabolic activity. On my review the patient's chest imaging there is no evidence of pleural thickening or effusion. No pathologic mediastinal adenopathy. No pericardial effusion. Cystic lesions are noted throughout both lungs in both the upper lung zones as well as the lower lung zones but most predominantly in the middle lung zones. Suspect the area of density and nodularity in the right lower lobe is possible adjacent consolidation with air bronchogram associated with one of the cysts. Area of atelectasis and hypermetabolic activity is medial in the right lower lobe and very minimal possibly represent inflammatory response from an infectious process and more consistent with an opacity rather than true atelectasis. Prior imaging of the abdomen going back to 2007 shows the cysts at least in the lower lobes.  PATHOLOGY Right Parotid Gland (08/01/15): Likely Early Non-Hodgkin's Lymphoma MALT type. IgH monoclonal proliferation on PCR.    Assessment & Plan:  The patient is a 75 year old Caucasian female with a very complex medical history. She  has remote diagnosis of Sjogren syndrome as well as newly diagnosed early non-Hodgkin's lymphoma. The patient seems to have had a persistent cough that has been more recent and I question whether or not this is correlating with the very mild hypermetabolic opacity in her medial right lower lobe. I remain suspicious about the possibility of a secondary infection of one of her lung cysts given the findings as noted above adjacent to this opacity in her right lower lobe. With regards to the cystic lung disease I believe this is been present going back to 2007 on my review of her abdominal CT imaging. I believe this is most likely secondary to the patient's Sjogren's syndrome as this can cause cystic lung disease approximately 10% of patients. There is no other suggestion of interstitial lung disease or pulmonary fibrosis on my review of the CT imaging. Overall the patient appears nontoxic. Certainly there could be an overlap autoimmune disease with her cough and I remain suspicious about the possibility of underlying rheumatoid arthritis. Given her physical exam complaints and findings as noted above. I instructed the patient to contact my office if she had any new breathing problems before her next appointment or change in her cough.  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 Quite probably secondary to Sjogren's syndrome. Checking high-resolution CT scan of chest without contrast. Checking full pulmonary function testing and 6 minute walk test on room air on her before next appointment. 2. Right lower lobe opacity: Question possible infectious process. Checking high-resolution CT scan of chest without contrast to better characterize underlying lung architecture. 3. Sjogren syndrome: Checking serum ESR, CRP, and a with reflex to comprehensive panel, SSA, SSB, rheumatoid factor, and anti-CCP to evaluate for other possible autoimmune etiologies to the patient's constellation of symptoms. 4. GERD: Checking  barium swallow. 5. Follow-up: Patient to return to clinic in 4-6 weeks to review the results of her testing.  Sonia Baller Ashok Cordia, M.D. [facility] Pager:  [phone] After 3pm or if no response, call [phone] 11:46 AM 10/18/2015

## 2015-10-19 LAB — CYCLIC CITRUL PEPTIDE ANTIBODY, IGG: Cyclic Citrullin Peptide Ab: <16 Units

## 2015-10-19 LAB — SJOGRENS SYNDROME-B EXTRACTABLE NUCLEAR ANTIBODY: SSB (LA) (ENA) ANTIBODY, IGG: POSITIVE — AB

## 2015-10-19 LAB — ANTI-NUCLEAR AB-TITER (ANA TITER): ANA Titer 1: 1:1280 {titer} — ABNORMAL HIGH

## 2015-10-19 LAB — SJOGRENS SYNDROME-A EXTRACTABLE NUCLEAR ANTIBODY: SSA (RO) (ENA) ANTIBODY, IGG: POSITIVE — AB

## 2015-10-19 LAB — ANA: ANA: POSITIVE — AB

## 2015-10-26 ENCOUNTER — Telehealth: Payer: Self-pay | Admitting: *Deleted

## 2015-10-26 ENCOUNTER — Ambulatory Visit (HOSPITAL_COMMUNITY)
Admission: RE | Admit: 2015-10-26 | Discharge: 2015-10-26 | Disposition: A | Discharge status: Home / Self Care | Payer: PPO | Source: Ambulatory Visit | Attending: Pulmonary Disease | Admitting: Pulmonary Disease

## 2015-10-26 DIAGNOSIS — M3500 Sicca syndrome, unspecified: Secondary | ICD-10-CM | POA: Diagnosis not present

## 2015-10-26 DIAGNOSIS — R918 Other nonspecific abnormal finding of lung field: Secondary | ICD-10-CM | POA: Diagnosis not present

## 2015-10-26 DIAGNOSIS — J984 Other disorders of lung: Secondary | ICD-10-CM

## 2015-10-26 DIAGNOSIS — R911 Solitary pulmonary nodule: Secondary | ICD-10-CM | POA: Diagnosis not present

## 2015-10-26 DIAGNOSIS — K449 Diaphragmatic hernia without obstruction or gangrene: Secondary | ICD-10-CM | POA: Diagnosis not present

## 2015-10-26 DIAGNOSIS — I2510 Atherosclerotic heart disease of native coronary artery without angina pectoris: Secondary | ICD-10-CM | POA: Insufficient documentation

## 2015-10-26 DIAGNOSIS — C8590 Non-Hodgkin lymphoma, unspecified, unspecified site: Secondary | ICD-10-CM | POA: Diagnosis not present

## 2015-10-26 DIAGNOSIS — K219 Gastro-esophageal reflux disease without esophagitis: Secondary | ICD-10-CM | POA: Diagnosis not present

## 2015-10-26 NOTE — Telephone Encounter (Signed)
Received call report from [facility] radiology regarding pt HRCT. IMPRESSION: 1. The mass-like area of concern in the medial aspect of the right lower lobe appears highly concerning for slow-growing neoplasm such as an adenocarcinoma. This currently measures 3.8 x 2.9 x 4.3 cm. Further evaluation with biopsy is strongly recommended in the near future to establish a tissue diagnosis. 2. Second 1.3 x 1.2 cm ground-glass attenuation nodule in the posterior aspect of the right lower lobe is nonspecific and warrants attention on follow-up studies.  Please advise Dr. Ashok Cordia thanks

## 2015-10-27 NOTE — Telephone Encounter (Signed)
Per JN, he is calling pt personally. Can close message

## 2015-11-02 ENCOUNTER — Telehealth: Payer: Self-pay | Admitting: Pulmonary Disease

## 2015-11-02 NOTE — Telephone Encounter (Signed)
Spoke with the patient regarding the results of her swallow tests, labs, and HRCT of the chest. I reviewed the patient's prior PET/CT imaging which does show hypermetabolic activity in the medial right lower lobe lesion in question. This has been present going back to 2013 on my review of abdominal CT imaging. The possibility of this being a very slow-growing lung cancer is certainly there. However, given her underlying Sjogren's syndrome as well as lymphoma other diagnoses must be considered. We discussed bronchoscopy but I do not feel this lesion would be reachable. Surgical lung biopsy is always an option but I would prefer to have the patient evaluated by interventional radiology first for possible CT-guided biopsy if they feel this is not too risky.

## 2015-11-03 ENCOUNTER — Telehealth: Payer: Self-pay | Admitting: *Deleted

## 2015-11-03 DIAGNOSIS — R911 Solitary pulmonary nodule: Secondary | ICD-10-CM

## 2015-11-03 NOTE — Telephone Encounter (Signed)
-----   Message from Javier Glazier, MD sent at 11/02/2015  8:31 PM EDT ----- Patient needs a referral to interventional radiology for a CT-guided biopsy of her right lower lobe hypermetabolic lesion. I have already contacted her and made her aware of this. Thank you.

## 2015-11-03 NOTE — Telephone Encounter (Signed)
I have placed order for below. Nothing further needed

## 2015-11-07 DIAGNOSIS — C07 Malignant neoplasm of parotid gland: Secondary | ICD-10-CM | POA: Diagnosis not present

## 2015-11-08 ENCOUNTER — Other Ambulatory Visit: Payer: Self-pay | Admitting: Radiology

## 2015-11-09 ENCOUNTER — Other Ambulatory Visit: Payer: Self-pay | Admitting: Radiology

## 2015-11-10 ENCOUNTER — Ambulatory Visit (HOSPITAL_COMMUNITY)
Admission: RE | Admit: 2015-11-10 | Discharge: 2015-11-10 | Disposition: A | Discharge status: Home / Self Care | Payer: PPO | Source: Ambulatory Visit | Attending: Diagnostic Radiology | Admitting: Diagnostic Radiology

## 2015-11-10 ENCOUNTER — Encounter (HOSPITAL_COMMUNITY): Payer: Self-pay

## 2015-11-10 ENCOUNTER — Ambulatory Visit (HOSPITAL_COMMUNITY)
Admission: RE | Admit: 2015-11-10 | Discharge: 2015-11-10 | Disposition: A | Discharge status: Home / Self Care | Payer: PPO | Source: Ambulatory Visit | Attending: Pulmonary Disease | Admitting: Pulmonary Disease

## 2015-11-10 DIAGNOSIS — Z8572 Personal history of non-Hodgkin lymphomas: Secondary | ICD-10-CM | POA: Diagnosis not present

## 2015-11-10 DIAGNOSIS — D1431 Benign neoplasm of right bronchus and lung: Secondary | ICD-10-CM | POA: Diagnosis not present

## 2015-11-10 DIAGNOSIS — Z880 Allergy status to penicillin: Secondary | ICD-10-CM | POA: Diagnosis not present

## 2015-11-10 DIAGNOSIS — R911 Solitary pulmonary nodule: Secondary | ICD-10-CM | POA: Diagnosis not present

## 2015-11-10 DIAGNOSIS — Z85038 Personal history of other malignant neoplasm of large intestine: Secondary | ICD-10-CM | POA: Diagnosis not present

## 2015-11-10 DIAGNOSIS — J984 Other disorders of lung: Secondary | ICD-10-CM | POA: Diagnosis not present

## 2015-11-10 DIAGNOSIS — R918 Other nonspecific abnormal finding of lung field: Secondary | ICD-10-CM | POA: Diagnosis not present

## 2015-11-10 DIAGNOSIS — E785 Hyperlipidemia, unspecified: Secondary | ICD-10-CM | POA: Diagnosis not present

## 2015-11-10 DIAGNOSIS — Z9889 Other specified postprocedural states: Secondary | ICD-10-CM

## 2015-11-10 DIAGNOSIS — I10 Essential (primary) hypertension: Secondary | ICD-10-CM | POA: Insufficient documentation

## 2015-11-10 LAB — CBC
HCT: 38.3 % (ref 36.0–46.0)
Hemoglobin: 12.5 g/dL (ref 12.0–15.0)
MCH: 28.9 pg (ref 26.0–34.0)
MCHC: 32.6 g/dL (ref 30.0–36.0)
MCV: 88.7 fL (ref 78.0–100.0)
PLATELETS: 230 10*3/uL (ref 150–400)
RBC: 4.32 MIL/uL (ref 3.87–5.11)
RDW: 12.9 % (ref 11.5–15.5)
WBC: 6.4 10*3/uL (ref 4.0–10.5)

## 2015-11-10 LAB — GLUCOSE, CAPILLARY: GLUCOSE-CAPILLARY: 111 mg/dL — AB (ref 65–99)

## 2015-11-10 LAB — PROTIME-INR
INR: 1.01 (ref 0.00–1.49)
PROTHROMBIN TIME: 13.5 s (ref 11.6–15.2)

## 2015-11-10 LAB — APTT: APTT: 27 s (ref 24–37)

## 2015-11-10 MED ORDER — MIDAZOLAM HCL 2 MG/2ML IJ SOLN
INTRAMUSCULAR | Status: AC | PRN
  Administered 2015-11-10 (×3): 0.5 mg via INTRAVENOUS

## 2015-11-10 MED ORDER — SODIUM CHLORIDE 0.9 % IV SOLN
INTRAVENOUS | Status: AC | PRN
  Administered 2015-11-10: 10 mL/h via INTRAVENOUS

## 2015-11-10 MED ORDER — MIDAZOLAM HCL 2 MG/2ML IJ SOLN
INTRAMUSCULAR | Status: AC
  Filled 2015-11-10: qty 2

## 2015-11-10 MED ORDER — HYDROCODONE-ACETAMINOPHEN 5-325 MG PO TABS: 1.0000 | ORAL_TABLET | ORAL | Status: DC | PRN

## 2015-11-10 MED ORDER — FENTANYL CITRATE (PF) 100 MCG/2ML IJ SOLN
INTRAMUSCULAR | Status: AC | PRN
  Administered 2015-11-10 (×3): 25 ug via INTRAVENOUS

## 2015-11-10 MED ORDER — FENTANYL CITRATE (PF) 100 MCG/2ML IJ SOLN
INTRAMUSCULAR | Status: AC
  Filled 2015-11-10: qty 2

## 2015-11-10 MED ORDER — LIDOCAINE HCL 1 % IJ SOLN
INTRAMUSCULAR | Status: AC
  Filled 2015-11-10: qty 20

## 2015-11-10 MED ORDER — SODIUM CHLORIDE 0.9 % IV SOLN: Freq: Once | INTRAVENOUS | Status: DC

## 2015-11-10 MED ORDER — MIDAZOLAM HCL 2 MG/2ML IJ SOLN
INTRAMUSCULAR | Status: AC
Start: 2015-11-10 — End: 2015-11-10
  Filled 2015-11-10: qty 2

## 2015-11-10 NOTE — Sedation Documentation (Signed)
Patient is resting comfortably. 

## 2015-11-10 NOTE — Procedures (Signed)
CT guided core biopsy of right lower lobe lesion.  Single 18 gauge core biopsy obtained.  No immediate complication.  No pneumothorax.  Plan for bedrest 2 hours and CXR in one hour.

## 2015-11-10 NOTE — Sedation Documentation (Signed)
Occasional grimace and moan, eyes open

## 2015-11-10 NOTE — H&P (Signed)
Chief Complaint: Patient was seen in consultation today for right lung mass biopsy at the request of Nestor,Jennings E  Referring Physician(s): Nestor,Jennings E  Supervising Physician: Markus Daft  History of Present Illness: [name] is a 75 y.o. 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 No clear evidence of lymphoma on the whole-body scan. 2. Nodular lymphoid tissue in the subcutaneous tissue anterior to the LEFT and RIGHT parotid gland is symmetric. This may relate to patient's Sjogren's syndrome 3. Hypermetabolic focus of atelectasis atelectasis in the RIGHT lower lobe adjacent to a cystic and solid lesion is likely benign. There are multiple cystic lesions within the lungs. Recommend follow-up CT of the thorax with contrast in 3 months for re-evaluation cystic nodule in the RIGHT lower lobe. The cystic nodule is not hypermetabolic. 4. Focal metabolic activity associated with the right neural arch / pedicle of the T5 vertebral body with an apparent healed fracture at this level. Question a pathologic or traumatic fracture. Recommend clinical correlation for trauma.  Referred to Dr Tera Partridge 10/18/2015 Scheduled CT 10/26/2015 IMPRESSION: 1. The mass-like area of concern in the medial aspect of the right lower lobe appears highly concerning for slow-growing neoplasm such as an adenocarcinoma. This currently measures 3.8 x 2.9 x 4.3 cm. Further evaluation with biopsy is strongly recommended in the near future to establish a tissue diagnosis. 2. Second 1.3 x 1.2 cm ground-glass attenuation nodule in the posterior aspect of the right lower lobe is nonspecific and warrants attention on follow-up studies. 3. No findings to suggest interstitial lung  disease. 4. Multiple thin-walled cysts noted throughout the mid to lower lungs bilaterally. This likely reflects cystic lung disease associated with Sjogren's disease. There are no widespread areas of ground-glass attenuation to suggest that this is a manifestation of lymphocytic interstitial pneumonia (LIP) associated with Sjogren's disease. This could alternatively suggest an underlying genetic disease such as Birt-Hogg   Scheduled now for Right lower lobe nodule biopsy  Past Medical History  Diagnosis Date  . Hyperlipidemia   . Sjogren's disease ([place])   . 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 Fibromyalgia   . GERD (gastroesophageal reflux disease)   . Diverticulosis   . Arthritis     right thumb [facility] arthritis  . Nephrolithiasis   . Complication of anesthesia     has dry mouth anyway from shograns-sore throats post op  . Cancer ([place])     lymphoma  . 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 Lung infiltrate on CT 09/14/2015  . Hypertension   . Nephrolithiasis     Past Surgical History  Procedure Laterality Date  . Vaginal hysterectomy  1989  . Kidney stone surgery  1974  . Mass excision  2008    abd-benign  . Appendectomy  2008  . Tonsillectomy    . Colonoscopy    . Artery biopsy  02/03/2013    Procedure: MINOR BIOPSY TEMPORAL ARTERY;  Surgeon: Ascencion Dike, MD;  Location: [facility];  Service: ENT;;  . Parotidectomy Right 08/01/2015    Procedure: RIGHT PAROTIDECTOMY;  Surgeon: [name], MD;  Location: [facility];  Service: ENT;  Laterality: Right;  . Abdominal hysterectomy      Allergies:  Losartan; Amoxicillin; Beta adrenergic blockers; and Clindamycin/lincomycin  Medications: Prior to Admission medications   Medication Sig Start Date End Date Taking? Authorizing Provider  Calcium Carbonate-Vitamin D (CALCIUM + D) 600-200 MG-UNIT TABS Take 2 tablets by mouth daily.     Yes  Historical Provider, MD  hydroxypropyl methylcellulose / hypromellose (ISOPTO TEARS / GONIOVISC) 2.5 % ophthalmic solution Place 1 drop into both eyes 3 (three) times daily as needed for dry eyes.   Yes Historical Provider, MD  POTASSIUM CHLORIDE PO Take 1 tablet by mouth daily. OTC   Yes Historical Provider, MD  simvastatin (ZOCOR) 10 MG tablet Take 1 tablet (10 mg total) by mouth at bedtime. 03/01/14  Yes Doe-Hyun Kyra Searles, DO  vitamin B-12 (CYANOCOBALAMIN) 1000 MCG tablet Take 1,000 mcg by mouth daily.   Yes Historical Provider, MD  Specialty Vitamins Products (MAGNESIUM, AMINO ACID CHELATE,) 133 MG tablet Take 1 tablet by mouth daily.    Historical Provider, MD     Family History  Problem Relation Age of Onset  . Diabetes Neg Hx   . Stomach cancer Neg Hx   . Colon cancer Mother 66  . Colon cancer Father     unknown when onset was    Social History   Social History  . Marital Status: Divorced    Spouse Name: N/A  . Number of Children: 2  . Years of Education: College   Occupational History  . Retired    Social History Main Topics  . Smoking status: Passive Smoke Exposure - Never Smoker  . Smokeless tobacco: Never Used     Comment: Passive exposure through her mother.  . Alcohol Use: 0.0 oz/week    0 Standard drinks or equivalent per week     Comment: Rarely  . Drug Use: No  . Sexual Activity: Not Asked     Comment: [place] sister.   Other Topics Concern  . None   Social History Narrative   Pt lives at home alone.   Caffeine Use: 2 mugs every other day.       [facility]:   Originally from [place], [place]. She has also lived in [place], [place], & [place]. Internationally she has been to [place], [place], [place], & [place]. Previously has worked in Press [employer]. No chemical or fume exposures. No pets currently. No bird, mold, or hot tub exposure. 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      Review of Systems:  A 12 point ROS discussed and pertinent positives are indicated in the HPI above.  All other systems are negative.  Review of Systems  Constitutional: Negative for fever, activity change, appetite change and fatigue.  Respiratory: Positive for cough. Negative for shortness of breath.   Gastrointestinal: Negative for abdominal pain.  Neurological: Negative for weakness.  Psychiatric/Behavioral: Negative for behavioral problems and confusion.    Vital Signs: BP 156/74 mmHg  Temp(Src) 98.2 F (36.8 C) (Oral)  Resp 18  Ht 5\' 9"  (1.753 m)  Wt 220 lb (99.791 kg)  BMI 32.47 kg/m2  SpO2 97%  Physical Exam  Constitutional: She is oriented to person, place, and time.  Cardiovascular: Normal rate, regular rhythm and normal heart sounds.   Pulmonary/Chest: Effort normal and breath sounds normal. She has no wheezes.  Abdominal: Soft. Bowel sounds are normal. There is no tenderness.  Musculoskeletal: Normal range of motion.  Neurological: She is alert and oriented to person, place, and time.  Skin: Skin  is warm and dry.  Psychiatric: She has a normal mood and affect. Her behavior is normal. Judgment and thought content normal.  Nursing note and vitals reviewed.   Mallampati Score:  MD Evaluation Airway: WNL Heart: WNL Abdomen: WNL Chest/ Lungs: WNL ASA  Classification: 2, 3 Mallampati/Airway Score: One  Imaging: Dg Esophagus  10/26/2015  CLINICAL DATA:  75 year old female with chronic Sjogren disease. 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 Abnormal right lower lobe lung lesion. Personal history of gastroesophageal reflux, much less symptomatic recently which the patient thinks may relate to empiric use of olive oil. Subsequent encounter. EXAM: ESOPHOGRAM / BARIUM SWALLOW / BARIUM TABLET STUDY TECHNIQUE: Combined double contrast and single contrast examination performed using effervescent crystals, thick barium liquid, and thin barium liquid. The patient was observed with  fluoroscopy swallowing a 13 mm barium sulphate tablet. FLUOROSCOPY TIME:  Radiation Exposure Index (as provided by the fluoroscopic device): 29.60 mGy. If the device does not provide the exposure index: Fluoroscopy Time:  1 minutes 36 seconds COMPARISON:  Chest CT from today reported separately, CT Abdomen and Pelvis 07/21/2012. FINDINGS: A double contrast study was undertaken and the patient tolerated this well and without difficulty. No obstruction to the forward flow of contrast throughout the esophagus and into the stomach. Normal esophageal course and contour. Normal esophageal mucosal pattern. Small gastric hiatal hernia demonstrated, also seen on the chest CT today and appears stable since 2013. A 12.5 mm barium tablet was administered and passed freely to the stomach without delay. Rapid sequence imaging of the cervical esophagus is remarkable for at tiny ventral esophageal web (series 11, image 3). Incidentally, there also appears to be a small left inferior pharyngocele (series 10, image 5) which is inconsequential. Normal esophageal motility with prone swallows. No gastroesophageal reflux occurred spontaneously or was elicited. IMPRESSION: 1. Positive for a small gastric hiatal hernia which has not significantly changed since 2013. 2. No esophageal reflux occurred or was elicited. 3. 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 Electronically Signed   By: Genevie Ann M.D.   On: 10/26/2015 15:19   Ct Chest High Resolution  10/26/2015  CLINICAL DATA:  74 year old female with history of abnormal lung findings on recent PET scan. Follow-up study. Dry cough for the past several months. EXAM: CT CHEST WITHOUT CONTRAST TECHNIQUE: Multidetector CT imaging of the chest was performed following the standard protocol without intravenous contrast. High resolution imaging of the lungs, as well as inspiratory and expiratory imaging, was performed. COMPARISON:   PET-CT 09/13/2015. CT the abdomen and pelvis 07/21/2012. FINDINGS: Mediastinum/Lymph Nodes: Heart size is normal. There is no significant pericardial fluid, thickening or pericardial calcification. There is atherosclerosis of the thoracic aorta, the great vessels of the mediastinum and the coronary arteries, including calcified atherosclerotic plaque in the left main and left circumflex coronary arteries. No pathologically enlarged mediastinal or hilar lymph nodes. Please note that accurate exclusion of hilar adenopathy is limited on noncontrast CT scans. Small hiatal hernia. No axillary lymphadenopathy. Lungs/Pleura: In the medial aspect of the right lower lobe (axial image 42 of series 7 and coronal image 73 of series 602) there is a mass-like area of ground-glass attenuation and irregular septal thickening with several bubbly lucencies. This lesion is very similar to recent PET-CT 09/13/2015, at which point this was determined to be hypermetabolic, but has clearly enlarged compared to remote prior CT of the abdomen and pelvis 07/21/2012. Several areas of soft tissue density are  noted within this lesion, measuring up to 13 mm in short axis (image 42 of series 2). In the posterior aspect of the right lower lobe there is a second 12 x 13 mm ground-glass attenuation nodule (image 50 of series 7). No acute consolidative airspace disease. No pleural effusions. There is a small amount of chronic volume loss and architectural distortion in the medial segment of the right middle lobe, which is unchanged, most compatible with chronic post infectious or inflammatory scarring. High-resolution images otherwise demonstrate no additional areas of significant ground-glass attenuation, subpleural reticulation, traction bronchiectasis or frank honeycombing to suggest interstitial lung disease. Inspiratory and expiratory imaging demonstrates some mild air trapping, indicative of mild small airways disease. Multiple thin-walled  cysts are noted, most evident throughout the mid to lower lungs bilaterally. Upper abdomen: Diffuse low attenuation throughout the hepatic parenchyma, compatible with hepatic steatosis. Atherosclerosis. Musculoskeletal: There are no aggressive appearing lytic or blastic lesions noted in the visualized portions of the skeleton. IMPRESSION: 1. The mass-like area of concern in the medial aspect of the right lower lobe appears highly concerning for slow-growing neoplasm such as an adenocarcinoma. This currently measures 3.8 x 2.9 x 4.3 cm. Further evaluation with biopsy is strongly recommended in the near future to establish a tissue diagnosis. 2. Second 1.3 x 1.2 cm ground-glass attenuation nodule in the posterior aspect of the right lower lobe is nonspecific and warrants attention on follow-up studies. 3. No findings to suggest interstitial lung disease. 4. Multiple thin-walled cysts noted throughout the mid to lower lungs bilaterally. This likely reflects cystic lung disease associated with Sjogren's disease. There are no widespread areas of ground-glass attenuation to suggest that this is a manifestation of lymphocytic interstitial pneumonia (LIP) associated with Sjogren's disease. This could alternatively suggest an underlying genetic disease such as Birt-Hogg-Dube syndrome. Clinical correlation is suggested. 5. Mild air trapping, indicative of small airways disease. 6. Atherosclerosis, including left main and left circumflex coronary artery disease. Assessment for potential risk factor modification, dietary therapy or pharmacologic therapy may be warranted, if clinically indicated. 7. Small hiatal hernia. These results will be called to the ordering clinician or representative by the Radiologist Assistant, and communication documented in the PACS or zVision Dashboard. Electronically Signed   By: Vinnie Langton M.D.   On: 10/26/2015 14:46    Labs:  CBC:  Recent Labs  11/10/15 0620  WBC 6.4  HGB 12.5    HCT 38.3  PLT 230    COAGS:  Recent Labs  11/10/15 0620  INR 1.01  APTT 27    BMP: No results for input(s): NA, K, CL, CO2, GLUCOSE, BUN, CALCIUM, CREATININE, GFRNONAA, GFRAA in the last 8760 hours.  Invalid input(s): CMP  LIVER FUNCTION TESTS: No results for input(s): BILITOT, AST, ALT, ALKPHOS, PROT, ALBUMIN in the last 8760 hours.  TUMOR MARKERS: No results for input(s): AFPTM, CEA, CA199, CHROMGRNA in the last 8760 hours.  Assessment and Plan:  RLL lung lesion Hx colon Ca Hx NHL Scheduled for biopsy of same Risks and Benefits discussed with the patient including, but not limited to bleeding, hemoptysis, respiratory failure requiring intubation, infection, pneumothorax requiring chest tube placement, stroke from air embolism or even death. All of the patient's questions were answered, patient is agreeable to proceed. Consent signed and in chart.   Thank you for this interesting consult.  I greatly enjoyed meeting LADINA WALMSLEY and look forward to participating in their care.  A copy of this report was sent to the requesting provider  on this date.  Electronically Signed: Monia Sabal A 11/10/2015, 7:30 AM   I spent a total of  30 minutes   in face to face in clinical consultation, greater than 50% of which was counseling/coordinating care for right lung mass biopsy    Agree with note.  Indeterminate ground glass area in RLL.  Plan for CT guided core biopsy of this area.  Risks discussed with patient and informed consent obtained.

## 2015-11-10 NOTE — Sedation Documentation (Signed)
Patient is resting comfortably.  Denies pain.

## 2015-11-10 NOTE — Sedation Documentation (Signed)
Patient is resting, uncomfortable lying on stomach.

## 2015-11-10 NOTE — Discharge Instructions (Signed)

## 2015-11-10 NOTE — Sedation Documentation (Signed)
Patient denies pain and is resting comfortably.  

## 2015-11-23 ENCOUNTER — Telehealth: Payer: Self-pay | Admitting: Hematology and Oncology

## 2015-11-23 NOTE — Telephone Encounter (Signed)
I have tried to call the patient 2 days in a row & I left her voicemail regarding recent lung biopsy suspicious for lymphoma. Awaiting to hear back from the patient to schedule a return appointment to discuss treatment option.

## 2015-11-23 NOTE — Telephone Encounter (Signed)
I spoke with the patient over the telephone and review recent test result with her. She is interested to discuss immunotherapy. I will bring her in tomorrow for further discussion

## 2015-11-24 ENCOUNTER — Encounter: Payer: Self-pay | Admitting: Hematology and Oncology

## 2015-11-24 ENCOUNTER — Other Ambulatory Visit: Payer: PPO

## 2015-11-24 ENCOUNTER — Encounter: Payer: Self-pay | Admitting: *Deleted

## 2015-11-24 ENCOUNTER — Ambulatory Visit (HOSPITAL_BASED_OUTPATIENT_CLINIC_OR_DEPARTMENT_OTHER): Payer: PPO | Admitting: Hematology and Oncology

## 2015-11-24 ENCOUNTER — Other Ambulatory Visit (HOSPITAL_BASED_OUTPATIENT_CLINIC_OR_DEPARTMENT_OTHER): Payer: PPO

## 2015-11-24 ENCOUNTER — Telehealth: Payer: Self-pay | Admitting: Hematology and Oncology

## 2015-11-24 VITALS — BP 159/72 | HR 108 | Temp 98.5°F | Resp 19 | Wt 225.3 lb

## 2015-11-24 DIAGNOSIS — C8381 Other non-follicular lymphoma, lymph nodes of head, face, and neck: Secondary | ICD-10-CM

## 2015-11-24 DIAGNOSIS — C8588 Other specified types of non-Hodgkin lymphoma, lymph nodes of multiple sites: Secondary | ICD-10-CM | POA: Diagnosis not present

## 2015-11-24 DIAGNOSIS — C8581 Other specified types of non-Hodgkin lymphoma, lymph nodes of head, face, and neck: Secondary | ICD-10-CM

## 2015-11-24 LAB — COMPREHENSIVE METABOLIC PANEL
ALBUMIN: 3.9 g/dL (ref 3.5–5.0)
ALK PHOS: 99 U/L (ref 40–150)
ALT: 38 U/L (ref 0–55)
AST: 27 U/L (ref 5–34)
Anion Gap: 8 mEq/L (ref 3–11)
BUN: 14.3 mg/dL (ref 7.0–26.0)
CALCIUM: 10.4 mg/dL (ref 8.4–10.4)
CO2: 27 mEq/L (ref 22–29)
Chloride: 105 mEq/L (ref 98–109)
Creatinine: 0.8 mg/dL (ref 0.6–1.1)
EGFR: 72 mL/min/{1.73_m2} — AB (ref >90)
Glucose: 109 mg/dl (ref 70–140)
POTASSIUM: 4.3 meq/L (ref 3.5–5.1)
Sodium: 139 mEq/L (ref 136–145)
Total Bilirubin: <0.3 mg/dL (ref 0.20–1.20)
Total Protein: 8 g/dL (ref 6.4–8.3)

## 2015-11-24 LAB — CBC WITH DIFFERENTIAL/PLATELET
BASO%: 0.7 % (ref 0.0–2.0)
Basophils Absolute: 0 10*3/uL (ref 0.0–0.1)
EOS%: 3.4 % (ref 0.0–7.0)
Eosinophils Absolute: 0.2 10*3/uL (ref 0.0–0.5)
HCT: 38.5 % (ref 34.8–46.6)
HGB: 12.7 g/dL (ref 11.6–15.9)
LYMPH%: 10.6 % — AB (ref 14.0–49.7)
MCH: 28.7 pg (ref 25.1–34.0)
MCHC: 33 g/dL (ref 31.5–36.0)
MCV: 87 fL (ref 79.5–101.0)
MONO#: 0.7 10*3/uL (ref 0.1–0.9)
MONO%: 10.1 % (ref 0.0–14.0)
NEUT%: 75.2 % (ref 38.4–76.8)
NEUTROS ABS: 5.4 10*3/uL (ref 1.5–6.5)
PLATELETS: 240 10*3/uL (ref 145–400)
RBC: 4.43 10*6/uL (ref 3.70–5.45)
RDW: 13.6 % (ref 11.2–14.5)
WBC: 7.2 10*3/uL (ref 3.9–10.3)
lymph#: 0.8 10*3/uL — ABNORMAL LOW (ref 0.9–3.3)

## 2015-11-24 LAB — LACTATE DEHYDROGENASE: LDH: 155 U/L (ref 125–245)

## 2015-11-24 NOTE — Progress Notes (Signed)
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 Please refer to report for other findings   11/10/2015 Procedure she underwent CT guided biopsy   11/10/2015 Pathology Results Accession: [id number] right lung CT-guided biopsy suspicious for lymphoma    INTERVAL HISTORY: Please see below for problem oriented charting. She returns to discuss test results. She denies complication from recent biopsy.  REVIEW OF SYSTEMS:   Constitutional: Denies fevers, chills or abnormal weight loss Eyes: Denies blurriness of vision Ears, nose, mouth, throat, and face: Denies mucositis or sore throat Respiratory: Denies cough, dyspnea or wheezes Cardiovascular: Denies palpitation, chest discomfort or lower extremity swelling Gastrointestinal:  Denies nausea, heartburn or change in bowel habits Skin: Denies abnormal skin rashes Lymphatics: Denies new lymphadenopathy or easy bruising Neurological:Denies numbness, tingling or new weaknesses Behavioral/Psych: Mood is stable, no new changes  All other systems were reviewed with the patient and are negative.  I have reviewed the past medical history, past surgical history, social history and family history with the patient and they are unchanged from previous note.  ALLERGIES:  is allergic to losartan;  amoxicillin; beta adrenergic blockers; and clindamycin/lincomycin.  MEDICATIONS:  Current Outpatient Prescriptions  Medication Sig Dispense Refill  . Calcium Carbonate-Vitamin D (CALCIUM + D) 600-200 MG-UNIT TABS Take 2 tablets by mouth daily.      . hydroxypropyl methylcellulose / hypromellose (ISOPTO TEARS / GONIOVISC) 2.5 % ophthalmic solution Place 1 drop into both eyes 3 (three) times daily as needed for dry eyes.    Marland Kitchen POTASSIUM CHLORIDE PO Take 1 tablet by mouth daily. OTC    . simvastatin (ZOCOR) 10 MG tablet Take 1 tablet (10 mg total) by mouth at bedtime. 90 tablet 1  . Specialty Vitamins Products (MAGNESIUM, AMINO ACID CHELATE,) 133 MG tablet Take 1 tablet by mouth daily.    . vitamin B-12 (CYANOCOBALAMIN) 1000 MCG tablet Take 1,000 mcg by mouth daily.     No current facility-administered medications for this visit.    PHYSICAL EXAMINATION: ECOG PERFORMANCE STATUS: 1 - Symptomatic but completely ambulatory  Filed Vitals:   11/24/15 0924  BP: 159/72  Pulse: 108  Temp: 98.5 F (36.9 C)  Resp: 19   Filed Weights   11/24/15 0924  Weight: 225 lb 4.8 oz (102.195 kg)    GENERAL:alert, no distress and comfortable SKIN: skin color, texture, turgor are normal, no rashes or significant lesions EYES: normal, Conjunctiva are pink and non-injected, sclera clear Musculoskeletal:no cyanosis of digits and no clubbing  NEURO: alert & oriented x 3 with fluent speech, no focal motor/sensory deficits  LABORATORY DATA:  I have reviewed the data as listed    Component Value Date/Time   NA 139 11/24/2015 1113   NA 139 10/26/2013 1013   K 4.3 11/24/2015 1113   K 4.4 10/26/2013 1013   CL 104 10/26/2013 1013   CO2 27 11/24/2015 1113   CO2 28 10/26/2013 1013  GLUCOSE 109 11/24/2015 1113   GLUCOSE 106* 10/26/2013 1013   BUN 14.3 11/24/2015 1113   BUN 14 10/26/2013 1013   CREATININE 0.8 11/24/2015 1113   CREATININE 0.7 10/26/2013 1013   CALCIUM 10.4 11/24/2015 1113   CALCIUM 9.6  10/26/2013 1013   PROT 8.0 11/24/2015 1113   PROT 7.5 10/26/2013 1013   ALBUMIN 3.9 11/24/2015 1113   ALBUMIN 3.8 10/26/2013 1013   AST 27 11/24/2015 1113   AST 17 10/26/2013 1013   ALT 38 11/24/2015 1113   ALT 16 10/26/2013 1013   ALKPHOS 99 11/24/2015 1113   ALKPHOS 75 10/26/2013 1013   BILITOT <0.30 11/24/2015 1113   BILITOT 0.4 10/26/2013 1013   GFRNONAA 84* 02/02/2013 1200   GFRAA >90 02/02/2013 1200    No results found for: SPEP, UPEP  Lab Results  Component Value Date   WBC 7.2 11/24/2015   NEUTROABS 5.4 11/24/2015   HGB 12.7 11/24/2015   HCT 38.5 11/24/2015   MCV 87.0 11/24/2015   PLT 240 11/24/2015      Chemistry      Component Value Date/Time   NA 139 11/24/2015 1113   NA 139 10/26/2013 1013   K 4.3 11/24/2015 1113   K 4.4 10/26/2013 1013   CL 104 10/26/2013 1013   CO2 27 11/24/2015 1113   CO2 28 10/26/2013 1013   BUN 14.3 11/24/2015 1113   BUN 14 10/26/2013 1013   CREATININE 0.8 11/24/2015 1113   CREATININE 0.7 10/26/2013 1013      Component Value Date/Time   CALCIUM 10.4 11/24/2015 1113   CALCIUM 9.6 10/26/2013 1013   ALKPHOS 99 11/24/2015 1113   ALKPHOS 75 10/26/2013 1013   AST 27 11/24/2015 1113   AST 17 10/26/2013 1013   ALT 38 11/24/2015 1113   ALT 16 10/26/2013 1013   BILITOT <0.30 11/24/2015 1113   BILITOT 0.4 10/26/2013 1013       RADIOGRAPHIC STUDIES:I reviewed her recent CT scan and PET/CT with the patient I have personally reviewed the radiological images as listed and agreed with the findings in the report.    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 I review her case at the hematology tumor board. The patient likely has marginal zone lymphoma involving multiple sites, on a background history of Sjogren's syndrome. I do not recommend pursuing additional biopsy of the lung. I discussed with her the  utility of immunotherapy with rituximab. We discussed the risk, benefit, side effects of rituximab and she agreed to proceed. I will give her 4 weekly doses and then plan to repeat imaging study a month after completion of treatment to assess response to treatment.   Orders Placed This Encounter  Procedures  . Hepatitis B core antibody, IgM    Standing Status: Future     Number of Occurrences: 1     Standing Expiration Date: 12/28/2016  . Hepatitis B surface antibody    Standing Status: Future     Number of Occurrences: 1     Standing Expiration Date: 12/28/2016  . Hepatitis B surface antigen    Standing Status: Future     Number of Occurrences: 1     Standing Expiration Date: 12/28/2016   All questions were answered. The patient knows to call the clinic with any problems, questions or concerns. No barriers to learning was detected. I spent 25 minutes counseling  the patient face to face. The total time spent in the appointment was 30 minutes and more than 50% was on counseling and review of test results     [employer], [place], MD 11/24/2015 4:01 PM

## 2015-11-24 NOTE — Progress Notes (Signed)
Spent time with patient in chemo class reviewing side effects of Rituxan.  See education notes

## 2015-11-24 NOTE — Assessment & Plan Note (Signed)
I reviewed with her biopsy report from prior parotid surgery and recent lung biopsy. I review her case at the hematology tumor board. The patient likely has marginal zone lymphoma involving multiple sites, on a background history of Sjogren's syndrome. I do not recommend pursuing additional biopsy of the lung. I discussed with her the utility of immunotherapy with rituximab. We discussed the risk, benefit, side effects of rituximab and she agreed to proceed. I will give her 4 weekly doses and then plan to repeat imaging study a month after completion of treatment to assess response to treatment.

## 2015-11-24 NOTE — Telephone Encounter (Signed)
Gave and printed appt sched and avs for pt for April and May °

## 2015-11-25 ENCOUNTER — Telehealth: Payer: Self-pay | Admitting: *Deleted

## 2015-11-25 ENCOUNTER — Ambulatory Visit: Payer: PPO

## 2015-11-25 LAB — HEPATITIS B SURFACE ANTIGEN: HEP B S AG: NEGATIVE

## 2015-11-25 LAB — HEPATITIS B CORE ANTIBODY, IGM: Hep B Core Ab, IgM: NEGATIVE

## 2015-11-25 LAB — SEDIMENTATION RATE: Sedimentation Rate-Westergren: 46 mm/hr — ABNORMAL HIGH (ref 0–40)

## 2015-11-25 LAB — HEPATITIS B SURFACE ANTIBODY,QUALITATIVE: Hep B Surface Ab, Qual: NONREACTIVE

## 2015-11-25 NOTE — Telephone Encounter (Signed)
Rheumatology referral to Dr. Melissa Noon office requested per Dr. Alvy Bimler.   Pt has history of Sjogren's Syndrome.  Called Dr. Melissa Noon office but they are closed this afternoon.  I did call pt and left her a VM to return nurse's call.  Would like to know if pt has ever seen Rheumatologist and/or who diagnosed her Sjogren's because Dr. Melissa Noon office may want records r/t to the Sjogren's diagnosis.

## 2015-11-28 ENCOUNTER — Encounter: Payer: Self-pay | Admitting: *Deleted

## 2015-11-28 NOTE — Progress Notes (Signed)
Spoke with patient. She did see a rheumatologist in [place], [place] in the 1980's. The doctor was very old and she is not sure he is still there. i let her know that they may request records from that practice.

## 2015-12-02 ENCOUNTER — Ambulatory Visit (HOSPITAL_BASED_OUTPATIENT_CLINIC_OR_DEPARTMENT_OTHER): Payer: PPO | Admitting: Nurse Practitioner

## 2015-12-02 ENCOUNTER — Encounter: Payer: Self-pay | Admitting: Nurse Practitioner

## 2015-12-02 ENCOUNTER — Ambulatory Visit (HOSPITAL_BASED_OUTPATIENT_CLINIC_OR_DEPARTMENT_OTHER): Payer: PPO

## 2015-12-02 VITALS — BP 159/77 | HR 100 | Temp 98.5°F | Resp 17

## 2015-12-02 DIAGNOSIS — T7840XA Allergy, unspecified, initial encounter: Secondary | ICD-10-CM

## 2015-12-02 DIAGNOSIS — C8591 Non-Hodgkin lymphoma, unspecified, lymph nodes of head, face, and neck: Secondary | ICD-10-CM

## 2015-12-02 DIAGNOSIS — T451X5A Adverse effect of antineoplastic and immunosuppressive drugs, initial encounter: Secondary | ICD-10-CM | POA: Diagnosis not present

## 2015-12-02 DIAGNOSIS — Z5112 Encounter for antineoplastic immunotherapy: Secondary | ICD-10-CM | POA: Diagnosis not present

## 2015-12-02 DIAGNOSIS — C8588 Other specified types of non-Hodgkin lymphoma, lymph nodes of multiple sites: Secondary | ICD-10-CM

## 2015-12-02 DIAGNOSIS — M3500 Sicca syndrome, unspecified: Secondary | ICD-10-CM

## 2015-12-02 MED ORDER — SODIUM CHLORIDE 0.9 % IV SOLN
Freq: Once | INTRAVENOUS | Status: AC
  Administered 2015-12-02: 09:00:00 via INTRAVENOUS

## 2015-12-02 MED ORDER — SODIUM CHLORIDE 0.9 % IV SOLN
375.0000 mg/m2 | Freq: Once | INTRAVENOUS | Status: AC
  Administered 2015-12-02: 800 mg via INTRAVENOUS
  Filled 2015-12-02: qty 70

## 2015-12-02 MED ORDER — DIPHENHYDRAMINE HCL 25 MG PO CAPS
50.0000 mg | ORAL_CAPSULE | Freq: Once | ORAL | Status: AC
  Administered 2015-12-02: 50 mg via ORAL

## 2015-12-02 MED ORDER — FAMOTIDINE IN NACL 20-0.9 MG/50ML-% IV SOLN
INTRAVENOUS | Status: AC
  Filled 2015-12-02: qty 50

## 2015-12-02 MED ORDER — ACETAMINOPHEN 325 MG PO TABS
ORAL_TABLET | ORAL | Status: AC
  Filled 2015-12-02: qty 2

## 2015-12-02 MED ORDER — METHYLPREDNISOLONE SODIUM SUCC 125 MG IJ SOLR
125.0000 mg | Freq: Once | INTRAMUSCULAR | Status: AC | PRN
Start: 2015-12-02 — End: 2015-12-02
  Administered 2015-12-02: 125 mg via INTRAVENOUS

## 2015-12-02 MED ORDER — DIPHENHYDRAMINE HCL 25 MG PO CAPS
ORAL_CAPSULE | ORAL | Status: AC
  Filled 2015-12-02: qty 2

## 2015-12-02 MED ORDER — METHYLPREDNISOLONE SODIUM SUCC 125 MG IJ SOLR
INTRAMUSCULAR | Status: AC
  Filled 2015-12-02: qty 2

## 2015-12-02 MED ORDER — LORAZEPAM 2 MG/ML IJ SOLN
INTRAMUSCULAR | Status: AC
  Filled 2015-12-02: qty 1

## 2015-12-02 MED ORDER — LORAZEPAM 2 MG/ML IJ SOLN
0.5000 mg | Freq: Once | INTRAMUSCULAR | Status: AC
  Administered 2015-12-02: 0.5 mg via INTRAVENOUS

## 2015-12-02 MED ORDER — SODIUM CHLORIDE 0.9% FLUSH
10.0000 mL | INTRAVENOUS | Status: DC | PRN
  Filled 2015-12-02: qty 10

## 2015-12-02 MED ORDER — ACETAMINOPHEN 325 MG PO TABS
650.0000 mg | ORAL_TABLET | Freq: Once | ORAL | Status: AC
  Administered 2015-12-02: 650 mg via ORAL

## 2015-12-02 MED ORDER — FAMOTIDINE IN NACL 20-0.9 MG/50ML-% IV SOLN
20.0000 mg | Freq: Once | INTRAVENOUS | Status: AC | PRN
  Administered 2015-12-02: 20 mg via INTRAVENOUS

## 2015-12-02 MED ORDER — SODIUM CHLORIDE 0.9 % IV SOLN
Freq: Once | INTRAVENOUS | Status: AC | PRN
  Administered 2015-12-02: 13:00:00 via INTRAVENOUS

## 2015-12-02 NOTE — Assessment & Plan Note (Signed)
Patient presented to the [facility] today to receive her first weekly Rituxan therapy.  She developed a mild hypersensitivity reaction; which was managed per protocol.  Patient was able to complete all of the Rituxan infusion with no further difficulty.  Patient is scheduled to return on 12/09/2015 for her next infusion.

## 2015-12-02 NOTE — Progress Notes (Signed)
SYMPTOM MANAGEMENT CLINIC    Chief Complaint: Hypersensitivity reaction  HPI:  [name] 75 y.o. female diagnosed with marginal zone lymphoma.  Currently undergoing Rituxan infusions.   Patient presented to the [facility] today to receive her first weekly Rituxan therapy.  She developed a mild hypersensitivity reaction; which consisted of some nausea and mild back pain.  Patient also appeared very anxious.  Confirmed the patient was premedicated with Benadryl 50 mg and Tylenol.  Patient was given Pepcid, and Solu-Medrol IV.  She was also given Ativan 0.5 mg which appeared to help with the nausea and anxiety.  All of patient's symptoms resolve; and patient was able to complete her Rituxan infusion with no further difficulties.    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 Please refer to report for other findings   11/10/2015 Procedure she underwent CT guided biopsy   11/10/2015 Pathology Results Accession: [id number] right lung CT-guided biopsy suspicious for lymphoma    Review of Systems  All other systems reviewed and are negative.   Past Medical History  Diagnosis Date  . Hyperlipidemia   . Sjogren's disease ([place])   . Meningioma ([place]) 11/2009    right post parietal 47m - stable 11/2009 MRI  . Fibromyalgia   . GERD (gastroesophageal reflux disease)   . Diverticulosis   . Arthritis     right thumb [facility] arthritis  . Nephrolithiasis   . Complication of anesthesia     has dry mouth anyway from shograns-sore throats post op  . Cancer ([place]     lymphoma  . 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 08/31/2015  . Lung infiltrate on CT 09/14/2015  . Hypertension   . Nephrolithiasis     Past  Surgical History  Procedure Laterality Date  . Vaginal hysterectomy  1989  . Kidney stone surgery  1974  . Mass excision  2008    abd-benign  . Appendectomy  2008  . Tonsillectomy    . Colonoscopy    . Artery biopsy  02/03/2013    Procedure: MINOR BIOPSY TEMPORAL ARTERY;  Surgeon: SAscencion Dike MD;  [facility]  Service: ENT;;  . Parotidectomy Right 08/01/2015    Procedure: RIGHT PAROTIDECTOMY;  Surgeon: SLeta Baptist MD;  [facility]  Service: ENT;  Laterality: Right;  . 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    is allergic to losartan; amoxicillin; beta adrenergic blockers; and clindamycin/lincomycin.    Medication List       This list is accurate as of: 12/02/15  5:21 PM.  Always use your most recent med list.               Calcium Carbonate-Vitamin D 600-200 MG-UNIT Tabs  Take 2 tablets by mouth daily.     hydroxypropyl methylcellulose / hypromellose 2.5 %  ophthalmic solution  Commonly known as:  ISOPTO TEARS / GONIOVISC  Place 1 drop into both eyes 3 (three) times daily as needed for dry eyes.     magnesium (amino acid chelate) 133 MG tablet  Take 1 tablet by mouth daily.     POTASSIUM CHLORIDE PO  Take 1 tablet by mouth daily. OTC     simvastatin 10 MG tablet  Commonly known as:  ZOCOR  Take 1 tablet (10 mg total) by mouth at bedtime.     vitamin B-12 1000 MCG tablet  Commonly known as:   CYANOCOBALAMIN  Take 1,000 mcg by mouth daily.         PHYSICAL EXAMINATION  Oncology Vitals 12/02/2015 12/02/2015  Temp 98.5 98.9  Pulse 100 96  Resp 17 17  SpO2 96 97   BP Readings from Last 2 Encounters:  12/02/15 159/77  11/24/15 159/72    Physical Exam  Constitutional: She is oriented to person, place, and time and well-developed, well-nourished, and in no distress.  HENT:  Head: Normocephalic and atraumatic.  Eyes: Conjunctivae and EOM are normal. Pupils are equal, round, and reactive to light. Right eye exhibits no discharge. Left eye exhibits no discharge. No scleral icterus.  Neck: Normal range of motion.  Pulmonary/Chest: Effort normal. No stridor. No respiratory distress.  Musculoskeletal: Normal range of motion.  Neurological: She is alert and oriented to person, place, and time.  Psychiatric:  Patient appears anxious.  Nursing note and vitals reviewed.   LABORATORY DATA:. No visits with results within 3 Day(s) from this visit. Latest known visit with results is:  Appointment on 11/24/2015  Component Date Value Ref Range Status  . Sodium 11/24/2015 139  136 - 145 mEq/L Final  . Potassium 11/24/2015 4.3  3.5 - 5.1 mEq/L Final  . Chloride 11/24/2015 105  98 - 109 mEq/L Final  . CO2 11/24/2015 27  22 - 29 mEq/L Final  . Glucose 11/24/2015 109  70 - 140 mg/dl Final   Glucose reference range is for nonfasting patients. Fasting glucose reference range is 70- 100.  Marland Kitchen BUN 11/24/2015 14.3  7.0 - 26.0 mg/dL Final  . Creatinine 11/24/2015 0.8  0.6 - 1.1 mg/dL Final  . Total Bilirubin 11/24/2015 <0.30  0.20 - 1.20 mg/dL Final  . Alkaline Phosphatase 11/24/2015 99  40 - 150 U/L Final  . AST 11/24/2015 27  5 - 34 U/L Final  . ALT 11/24/2015 38  0 - 55 U/L Final  . Total Protein 11/24/2015 8.0  6.4 - 8.3 g/dL Final  . Albumin 11/24/2015 3.9  3.5 - 5.0 g/dL Final  . Calcium 11/24/2015 10.4  8.4 - 10.4 mg/dL Final  . Anion Gap 11/24/2015 8  3 - 11 mEq/L Final  . EGFR  11/24/2015 72* >90 ml/min/1.73 m2 Final   eGFR is calculated using the CKD-EPI Creatinine Equation (2009)  . WBC 11/24/2015 7.2  3.9 - 10.3 10e3/uL Final  . NEUT# 11/24/2015 5.4  1.5 - 6.5 10e3/uL Final  . HGB 11/24/2015 12.7  11.6 - 15.9 g/dL Final  . HCT 11/24/2015 38.5  34.8 - 46.6 % Final  . Platelets 11/24/2015 240  145 - 400 10e3/uL Final  . MCV 11/24/2015 87.0  79.5 - 101.0 fL Final  . MCH 11/24/2015 28.7  25.1 - 34.0 pg Final  . MCHC 11/24/2015 33.0  31.5 - 36.0 g/dL Final  . RBC 11/24/2015 4.43  3.70 - 5.45 10e6/uL Final  . RDW 11/24/2015 13.6  11.2 - 14.5 %  Final  . lymph# 11/24/2015 0.8* 0.9 - 3.3 10e3/uL Final  . MONO# 11/24/2015 0.7  0.1 - 0.9 10e3/uL Final  . Eosinophils Absolute 11/24/2015 0.2  0.0 - 0.5 10e3/uL Final  . Basophils Absolute 11/24/2015 0.0  0.0 - 0.1 10e3/uL Final  . NEUT% 11/24/2015 75.2  38.4 - 76.8 % Final  . LYMPH% 11/24/2015 10.6* 14.0 - 49.7 % Final  . MONO% 11/24/2015 10.1  0.0 - 14.0 % Final  . EOS% 11/24/2015 3.4  0.0 - 7.0 % Final  . BASO% 11/24/2015 0.7  0.0 - 2.0 % Final  . LDH 11/24/2015 155  125 - 245 U/L Final  . Sedimentation Rate-Westergren 11/24/2015 46* 0 - 40 mm/hr Final  . Hep B Core Ab, IgM 11/24/2015 Negative  Negative Final  . Hep B Surface Ab, Qual 11/24/2015 Non Reactive   Final   Comment:                              Non Reactive: Inconsistent with immunity,                                            less than 10 mIU/mL                              Reactive:     Consistent with immunity,                                            greater than 9.9 mIU/mL   . HBsAg Screen 11/24/2015 Negative  Negative Final    RADIOGRAPHIC STUDIES: No results found.  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  She developed a mild hypersensitivity reaction; which was managed per protocol.  Patient was able to  complete all of the Rituxan infusion with no further difficulty.  Patient is scheduled to return on 12/09/2015 for her next infusion.  Hypersensitivity reaction Patient presented to the [facility] today to receive her first weekly Rituxan therapy.  She developed a mild hypersensitivity reaction; which consisted of some nausea and mild back pain.  Patient also appeared very anxious.  Confirmed the patient was premedicated with Benadryl 50 mg and Tylenol.  Patient was given Pepcid, and Solu-Medrol IV.  She was also given Ativan 0.5 mg which appeared to help with the nausea and anxiety.  All of patient's symptoms resolve; and patient was able to complete her Rituxan infusion with no further difficulties.   Patient stated understanding of all instructions; and was in agreement with this plan of care. The patient knows to call the clinic with any problems, questions or concerns.   Total time spent with patient was 25 minutes;  with greater than 75 percent of that time spent in face to face counseling regarding patient's symptoms,  and coordination of care and follow up.  Disclaimer:This dictation was prepared with Dragon/digital dictation along with [employer]. Any transcriptional errors that result from this process are unintentional.  Drue Second, NP 12/02/2015

## 2015-12-02 NOTE — Patient Instructions (Addendum)
[facility] Discharge Instructions for Patients Receiving Chemotherapy  Today you received the following chemotherapy agents Rituxan  To help prevent nausea and vomiting after your treatment, we encourage you to take your nausea medication as directed.    If you develop nausea and vomiting that is not controlled by your nausea medication, call the clinic.   BELOW ARE SYMPTOMS THAT SHOULD BE REPORTED IMMEDIATELY:  *FEVER GREATER THAN 100.5 F  *CHILLS WITH OR WITHOUT FEVER  NAUSEA AND VOMITING THAT IS NOT CONTROLLED WITH YOUR NAUSEA MEDICATION  *UNUSUAL SHORTNESS OF BREATH  *UNUSUAL BRUISING OR BLEEDING  TENDERNESS IN MOUTH AND THROAT WITH OR WITHOUT PRESENCE OF ULCERS  *URINARY PROBLEMS  *BOWEL PROBLEMS  UNUSUAL RASH Items with * indicate a potential emergency and should be followed up as soon as possible.  Feel free to call the clinic you have any questions or concerns. The clinic phone number is [phone].  Please show the CHEMO ALERT CARD at check-in to the Emergency Department and triage nurse.  Rituximab injection What is this medicine? RITUXIMAB (ri TUX i mab) is a monoclonal antibody. It is used commonly to treat non-Hodgkin lymphoma and other conditions. It is also used to treat rheumatoid arthritis (RA). In RA, this medicine slows the inflammatory process and help reduce joint pain and swelling. This medicine is often used with other cancer or arthritis medications. This medicine may be used for other purposes; ask your health care provider or pharmacist if you have questions. What should I tell my health care provider before I take this medicine? They need to know if you have any of these conditions: -blood disorders -heart disease -history of hepatitis B -infection (especially a virus infection such as chickenpox, cold sores, or herpes) -irregular heartbeat -kidney disease -lung or breathing disease, like asthma -lupus -an unusual or  allergic reaction to rituximab, mouse proteins, other medicines, foods, dyes, or preservatives -pregnant or trying to get pregnant -breast-feeding How should I use this medicine? This medicine is for infusion into a vein. It is administered in a hospital or clinic by a specially trained health care professional. A special MedGuide will be given to you by the pharmacist with each prescription and refill. Be sure to read this information carefully each time. Talk to your pediatrician regarding the use of this medicine in children. This medicine is not approved for use in children. Overdosage: If you think you have taken too much of this medicine contact a poison control center or emergency room at once. NOTE: This medicine is only for you. Do not share this medicine with others. What if I miss a dose? It is important not to miss a dose. Call your doctor or health care professional if you are unable to keep an appointment. What may interact with this medicine? -cisplatin -medicines for blood pressure -some other medicines for arthritis -vaccines This list may not describe all possible interactions. Give your health care provider a list of all the medicines, herbs, non-prescription drugs, or dietary supplements you use. Also tell them if you smoke, drink alcohol, or use illegal drugs. Some items may interact with your medicine. What should I watch for while using this medicine? Report any side effects that you notice during your treatment right away, such as changes in your breathing, fever, chills, dizziness or lightheadedness. These effects are more common with the first dose. Visit your prescriber or health care professional for checks on your progress. You will need to have regular blood work. Report   any other side effects. The side effects of this medicine can continue after you finish your treatment. Continue your course of treatment even though you feel ill unless your doctor tells you to  stop. Call your doctor or health care professional for advice if you get a fever, chills or sore throat, or other symptoms of a cold or flu. Do not treat yourself. This drug decreases your body's ability to fight infections. Try to avoid being around people who are sick. This medicine may increase your risk to bruise or bleed. Call your doctor or health care professional if you notice any unusual bleeding. Be careful brushing and flossing your teeth or using a toothpick because you may get an infection or bleed more easily. If you have any dental work done, tell your dentist you are receiving this medicine. Avoid taking products that contain aspirin, acetaminophen, ibuprofen, naproxen, or ketoprofen unless instructed by your doctor. These medicines may hide a fever. Do not become pregnant while taking this medicine. Women should inform their doctor if they wish to become pregnant or think they might be pregnant. There is a potential for serious side effects to an unborn child. Talk to your health care professional or pharmacist for more information. Do not breast-feed an infant while taking this medicine. What side effects may I notice from receiving this medicine? Side effects that you should report to your doctor or health care professional as soon as possible: -allergic reactions like skin rash, itching or hives, swelling of the face, lips, or tongue -low blood counts - this medicine may decrease the number of white blood cells, red blood cells and platelets. You may be at increased risk for infections and bleeding. -signs of infection - fever or chills, cough, sore throat, pain or difficulty passing urine -signs of decreased platelets or bleeding - bruising, pinpoint red spots on the skin, black, tarry stools, blood in the urine -signs of decreased red blood cells - unusually weak or tired, fainting spells, lightheadedness -breathing problems -confused, not responsive -chest pain -fast, irregular  heartbeat -feeling faint or lightheaded, falls -mouth sores -redness, blistering, peeling or loosening of the skin, including inside the mouth -stomach pain -swelling of the ankles, feet, or hands -trouble passing urine or change in the amount of urine Side effects that usually do not require medical attention (report to your doctor or other health care professional if they continue or are bothersome): -anxiety -headache -loss of appetite -muscle aches -nausea -night sweats This list may not describe all possible side effects. Call your doctor for medical advice about side effects. You may report side effects to FDA at 1-800-FDA-1088. Where should I keep my medicine? This drug is given in a hospital or clinic and will not be stored at home. NOTE: This sheet is a summary. It may not cover all possible information. If you have questions about this medicine, talk to your doctor, pharmacist, or health care provider.    2016, [employer]vier/[employer] Standard. (2014-10-13 22:30:56)  

## 2015-12-02 NOTE — Progress Notes (Signed)
Pt started to have complaints of neck tightness and chills. Vitals updated and BP with significant drop; refer to vital flowsheet. Rituxan stopped and IVF started. No response in BP after fluids; Retta Mac NP at bedside to evaluate pt. Pt medicated per orders; refer to MAR (solumedrol, pepcid, ativan). Pt remained awake and alert during entire episode. Pt given extra blankets. IVF continued to infuse. Rigors noted to legs and Retta Mac NP back at bedside to evaluate pt. Pt given Ativan per orders; see MAR. Rigors stopped and pt vitals WNL. Pt willing to continue treatment at this time. Per Dr. Alvy Bimler ok to continue treatment.

## 2015-12-02 NOTE — Assessment & Plan Note (Signed)
Patient presented to the [facility] today to receive her first weekly Rituxan therapy.  She developed a mild hypersensitivity reaction; which consisted of some nausea and mild back pain.  Patient also appeared very anxious.  Confirmed the patient was premedicated with Benadryl 50 mg and Tylenol.  Patient was given Pepcid, and Solu-Medrol IV.  She was also given Ativan 0.5 mg which appeared to help with the nausea and anxiety.  All of patient's symptoms resolve; and patient was able to complete her Rituxan infusion with no further difficulties.

## 2015-12-05 ENCOUNTER — Telehealth: Payer: Self-pay | Admitting: *Deleted

## 2015-12-05 NOTE — Telephone Encounter (Signed)
Received Fax from Dr. Melissa Noon office states they received referral and contacted pt.  Note says pt will call them after her treatments are completed to set up appt w/ them.

## 2015-12-05 NOTE — Telephone Encounter (Signed)
Pt denies any side effects from Rituxan after she returned home other than being drowsy from the Benadryl.  She asks if she has to take Benadryl next time?  Instructed pt it is safer to take Benadryl prior to Rituxan especially since she had a reaction to the first Rituxan.  Maybe it can be discontinued at a later treatment if she tolerates it w/o any reaction.  Pt verbalized understanding that she will continue to need benadryl premeds for now.

## 2015-12-05 NOTE — Telephone Encounter (Signed)
-----   Message from Shelda Altes, RN sent at 12/02/2015  2:59 PM EDT ----- Dr. Alvy Bimler follow up call for first time rituxan

## 2015-12-07 ENCOUNTER — Telehealth: Payer: Self-pay | Admitting: Pulmonary Disease

## 2015-12-07 NOTE — Telephone Encounter (Signed)
Called spoke with pt. 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 She states that she was unable to make her ov for 4/6 and 12/13/15 due to the chemotherapy. She will complete her chemotherapy on 12/23/15. She requests a message be sent to JN to make him aware of situation but to also get his recs on wether she should reschedule her ov. I explained to her that we can schedule her for after 12/23/15 but she insists she receive JN's recs before scheduling an ov. She voiced understanding and had no further questions.  JN please advise

## 2015-12-08 NOTE — Telephone Encounter (Signed)
Please let her know that Dr. Alvy Bimler has been keeping me informed.  We can reschedule her appointment as well as 6MWT and Full PFT in May after her chemotherapy is complete. Thanks.

## 2015-12-08 NOTE — Telephone Encounter (Signed)
Called spoke with pt. I informed her of JN's recs. She states she will call our office once she has completed the chemotherapy and is feeling better to schedule the ov, PFT, and 6MW. She voiced understanding and had no further questions. Nothing further needed at this time.

## 2015-12-09 ENCOUNTER — Ambulatory Visit (HOSPITAL_BASED_OUTPATIENT_CLINIC_OR_DEPARTMENT_OTHER): Payer: PPO

## 2015-12-09 VITALS — BP 135/61 | HR 72 | Temp 98.0°F | Resp 18

## 2015-12-09 DIAGNOSIS — Z5112 Encounter for antineoplastic immunotherapy: Secondary | ICD-10-CM

## 2015-12-09 DIAGNOSIS — C8591 Non-Hodgkin lymphoma, unspecified, lymph nodes of head, face, and neck: Secondary | ICD-10-CM

## 2015-12-09 DIAGNOSIS — C8588 Other specified types of non-Hodgkin lymphoma, lymph nodes of multiple sites: Secondary | ICD-10-CM

## 2015-12-09 DIAGNOSIS — M3500 Sicca syndrome, unspecified: Secondary | ICD-10-CM

## 2015-12-09 MED ORDER — ACETAMINOPHEN 325 MG PO TABS
ORAL_TABLET | ORAL | Status: AC
  Filled 2015-12-09: qty 2

## 2015-12-09 MED ORDER — SODIUM CHLORIDE 0.9 % IV SOLN
Freq: Once | INTRAVENOUS | Status: AC
  Administered 2015-12-09: 09:00:00 via INTRAVENOUS

## 2015-12-09 MED ORDER — DIPHENHYDRAMINE HCL 25 MG PO CAPS
ORAL_CAPSULE | ORAL | Status: AC
  Filled 2015-12-09: qty 2

## 2015-12-09 MED ORDER — ACETAMINOPHEN 325 MG PO TABS
650.0000 mg | ORAL_TABLET | Freq: Once | ORAL | Status: AC
  Administered 2015-12-09: 650 mg via ORAL

## 2015-12-09 MED ORDER — SODIUM CHLORIDE 0.9 % IV SOLN
375.0000 mg/m2 | Freq: Once | INTRAVENOUS | Status: AC
  Administered 2015-12-09: 800 mg via INTRAVENOUS
  Filled 2015-12-09: qty 70

## 2015-12-09 MED ORDER — DIPHENHYDRAMINE HCL 25 MG PO CAPS
50.0000 mg | ORAL_CAPSULE | Freq: Once | ORAL | Status: AC
  Administered 2015-12-09: 50 mg via ORAL

## 2015-12-09 NOTE — Progress Notes (Signed)
No need for additional pre meds prior to Rituxan per Dr. Alvy Bimler.

## 2015-12-09 NOTE — Patient Instructions (Signed)
Rituximab injection What is this medicine? RITUXIMAB (ri TUX i mab) is a monoclonal antibody. It is used commonly to treat non-Hodgkin lymphoma and other conditions. It is also used to treat rheumatoid arthritis (RA). In RA, this medicine slows the inflammatory process and help reduce joint pain and swelling. This medicine is often used with other cancer or arthritis medications. This medicine may be used for other purposes; ask your health care provider or pharmacist if you have questions. What should I tell my health care provider before I take this medicine? They need to know if you have any of these conditions: -blood disorders -heart disease -history of hepatitis B -infection (especially a virus infection such as chickenpox, cold sores, or herpes) -irregular heartbeat -kidney disease -lung or breathing disease, like asthma -lupus -an unusual or allergic reaction to rituximab, mouse proteins, other medicines, foods, dyes, or preservatives -pregnant or trying to get pregnant -breast-feeding How should I use this medicine? This medicine is for infusion into a vein. It is administered in a hospital or clinic by a specially trained health care professional. A special MedGuide will be given to you by the pharmacist with each prescription and refill. Be sure to read this information carefully each time. Talk to your pediatrician regarding the use of this medicine in children. This medicine is not approved for use in children. Overdosage: If you think you have taken too much of this medicine contact a poison control center or emergency room at once. NOTE: This medicine is only for you. Do not share this medicine with others. What if I miss a dose? It is important not to miss a dose. Call your doctor or health care professional if you are unable to keep an appointment. What may interact with this medicine? -cisplatin -medicines for blood pressure -some other medicines for  arthritis -vaccines This list may not describe all possible interactions. Give your health care provider a list of all the medicines, herbs, non-prescription drugs, or dietary supplements you use. Also tell them if you smoke, drink alcohol, or use illegal drugs. Some items may interact with your medicine. What should I watch for while using this medicine? Report any side effects that you notice during your treatment right away, such as changes in your breathing, fever, chills, dizziness or lightheadedness. These effects are more common with the first dose. Visit your prescriber or health care professional for checks on your progress. You will need to have regular blood work. Report any other side effects. The side effects of this medicine can continue after you finish your treatment. Continue your course of treatment even though you feel ill unless your doctor tells you to stop. Call your doctor or health care professional for advice if you get a fever, chills or sore throat, or other symptoms of a cold or flu. Do not treat yourself. This drug decreases your body's ability to fight infections. Try to avoid being around people who are sick. This medicine may increase your risk to bruise or bleed. Call your doctor or health care professional if you notice any unusual bleeding. Be careful brushing and flossing your teeth or using a toothpick because you may get an infection or bleed more easily. If you have any dental work done, tell your dentist you are receiving this medicine. Avoid taking products that contain aspirin, acetaminophen, ibuprofen, naproxen, or ketoprofen unless instructed by your doctor. These medicines may hide a fever. Do not become pregnant while taking this medicine. Women should inform their doctor if   they wish to become pregnant or think they might be pregnant. There is a potential for serious side effects to an unborn child. Talk to your health care professional or pharmacist for more  information. Do not breast-feed an infant while taking this medicine. What side effects may I notice from receiving this medicine? Side effects that you should report to your doctor or health care professional as soon as possible: -allergic reactions like skin rash, itching or hives, swelling of the face, lips, or tongue -low blood counts - this medicine may decrease the number of white blood cells, red blood cells and platelets. You may be at increased risk for infections and bleeding. -signs of infection - fever or chills, cough, sore throat, pain or difficulty passing urine -signs of decreased platelets or bleeding - bruising, pinpoint red spots on the skin, black, tarry stools, blood in the urine -signs of decreased red blood cells - unusually weak or tired, fainting spells, lightheadedness -breathing problems -confused, not responsive -chest pain -fast, irregular heartbeat -feeling faint or lightheaded, falls -mouth sores -redness, blistering, peeling or loosening of the skin, including inside the mouth -stomach pain -swelling of the ankles, feet, or hands -trouble passing urine or change in the amount of urine Side effects that usually do not require medical attention (report to your doctor or other health care professional if they continue or are bothersome): -anxiety -headache -loss of appetite -muscle aches -nausea -night sweats This list may not describe all possible side effects. Call your doctor for medical advice about side effects. You may report side effects to FDA at 1-800-FDA-1088. Where should I keep my medicine? This drug is given in a hospital or clinic and will not be stored at home. NOTE: This sheet is a summary. It may not cover all possible information. If you have questions about this medicine, talk to your doctor, pharmacist, or health care provider.    2016, [employer]vier/[employer] Standard. (2014-10-13 22:30:56)  

## 2015-12-12 ENCOUNTER — Other Ambulatory Visit: Payer: Self-pay

## 2015-12-12 DIAGNOSIS — Z1231 Encounter for screening mammogram for malignant neoplasm of breast: Secondary | ICD-10-CM

## 2015-12-13 ENCOUNTER — Ambulatory Visit: Payer: PPO | Admitting: Pulmonary Disease

## 2015-12-16 ENCOUNTER — Ambulatory Visit (HOSPITAL_BASED_OUTPATIENT_CLINIC_OR_DEPARTMENT_OTHER): Payer: PPO

## 2015-12-16 VITALS — BP 114/69 | HR 67 | Temp 97.9°F | Resp 18

## 2015-12-16 DIAGNOSIS — C8591 Non-Hodgkin lymphoma, unspecified, lymph nodes of head, face, and neck: Secondary | ICD-10-CM

## 2015-12-16 DIAGNOSIS — C8588 Other specified types of non-Hodgkin lymphoma, lymph nodes of multiple sites: Secondary | ICD-10-CM | POA: Diagnosis not present

## 2015-12-16 DIAGNOSIS — M3500 Sicca syndrome, unspecified: Secondary | ICD-10-CM

## 2015-12-16 DIAGNOSIS — Z5112 Encounter for antineoplastic immunotherapy: Secondary | ICD-10-CM

## 2015-12-16 MED ORDER — DIPHENHYDRAMINE HCL 25 MG PO CAPS
50.0000 mg | ORAL_CAPSULE | Freq: Once | ORAL | Status: AC
  Administered 2015-12-16: 50 mg via ORAL

## 2015-12-16 MED ORDER — ACETAMINOPHEN 325 MG PO TABS
ORAL_TABLET | ORAL | Status: AC
  Filled 2015-12-16: qty 2

## 2015-12-16 MED ORDER — ACETAMINOPHEN 325 MG PO TABS
650.0000 mg | ORAL_TABLET | Freq: Once | ORAL | Status: AC
  Administered 2015-12-16: 650 mg via ORAL

## 2015-12-16 MED ORDER — RITUXIMAB CHEMO INJECTION 500 MG/50ML
375.0000 mg/m2 | Freq: Once | INTRAVENOUS | Status: AC
  Administered 2015-12-16: 800 mg via INTRAVENOUS
  Filled 2015-12-16: qty 70

## 2015-12-16 MED ORDER — SODIUM CHLORIDE 0.9 % IV SOLN
Freq: Once | INTRAVENOUS | Status: AC
  Administered 2015-12-16: 09:00:00 via INTRAVENOUS

## 2015-12-16 MED ORDER — DIPHENHYDRAMINE HCL 25 MG PO CAPS
ORAL_CAPSULE | ORAL | Status: AC
  Filled 2015-12-16: qty 2

## 2015-12-16 NOTE — Patient Instructions (Signed)
[facility] Discharge Instructions for Patients Receiving Chemotherapy  Today you received the following chemotherapy agents Rituxan To help prevent nausea and vomiting after your treatment, we encourage you to take your nausea medication as prescribed.  If you develop nausea and vomiting that is not controlled by your nausea medication, call the clinic.   BELOW ARE SYMPTOMS THAT SHOULD BE REPORTED IMMEDIATELY:  *FEVER GREATER THAN 100.5 F  *CHILLS WITH OR WITHOUT FEVER  NAUSEA AND VOMITING THAT IS NOT CONTROLLED WITH YOUR NAUSEA MEDICATION  *UNUSUAL SHORTNESS OF BREATH  *UNUSUAL BRUISING OR BLEEDING  TENDERNESS IN MOUTH AND THROAT WITH OR WITHOUT PRESENCE OF ULCERS  *URINARY PROBLEMS  *BOWEL PROBLEMS  UNUSUAL RASH Items with * indicate a potential emergency and should be followed up as soon as possible.  Feel free to call the clinic you have any questions or concerns. The clinic phone number is [phone].  Please show the CHEMO ALERT CARD at check-in to the Emergency Department and triage nurse.   

## 2015-12-23 ENCOUNTER — Ambulatory Visit (HOSPITAL_BASED_OUTPATIENT_CLINIC_OR_DEPARTMENT_OTHER): Payer: PPO | Admitting: Hematology and Oncology

## 2015-12-23 ENCOUNTER — Encounter: Payer: Self-pay | Admitting: Hematology and Oncology

## 2015-12-23 ENCOUNTER — Ambulatory Visit (HOSPITAL_BASED_OUTPATIENT_CLINIC_OR_DEPARTMENT_OTHER): Payer: PPO

## 2015-12-23 VITALS — BP 112/60 | HR 70 | Temp 97.7°F | Resp 20

## 2015-12-23 VITALS — BP 148/67 | HR 93 | Temp 98.0°F | Resp 18 | Wt 223.6 lb

## 2015-12-23 DIAGNOSIS — C8588 Other specified types of non-Hodgkin lymphoma, lymph nodes of multiple sites: Secondary | ICD-10-CM

## 2015-12-23 DIAGNOSIS — C8591 Non-Hodgkin lymphoma, unspecified, lymph nodes of head, face, and neck: Secondary | ICD-10-CM

## 2015-12-23 DIAGNOSIS — Z5112 Encounter for antineoplastic immunotherapy: Secondary | ICD-10-CM

## 2015-12-23 DIAGNOSIS — M3500 Sicca syndrome, unspecified: Secondary | ICD-10-CM

## 2015-12-23 MED ORDER — SODIUM CHLORIDE 0.9 % IV SOLN
375.0000 mg/m2 | Freq: Once | INTRAVENOUS | Status: AC
  Administered 2015-12-23: 800 mg via INTRAVENOUS
  Filled 2015-12-23: qty 70

## 2015-12-23 MED ORDER — DIPHENHYDRAMINE HCL 25 MG PO CAPS
50.0000 mg | ORAL_CAPSULE | Freq: Once | ORAL | Status: AC
  Administered 2015-12-23: 50 mg via ORAL

## 2015-12-23 MED ORDER — SODIUM CHLORIDE 0.9 % IV SOLN
Freq: Once | INTRAVENOUS | Status: AC
  Administered 2015-12-23: 11:00:00 via INTRAVENOUS

## 2015-12-23 MED ORDER — ACETAMINOPHEN 325 MG PO TABS
650.0000 mg | ORAL_TABLET | Freq: Once | ORAL | Status: AC
  Administered 2015-12-23: 650 mg via ORAL

## 2015-12-23 MED ORDER — ACETAMINOPHEN 325 MG PO TABS
ORAL_TABLET | ORAL | Status: AC
  Filled 2015-12-23: qty 2

## 2015-12-23 MED ORDER — DIPHENHYDRAMINE HCL 25 MG PO CAPS
ORAL_CAPSULE | ORAL | Status: AC
  Filled 2015-12-23: qty 2

## 2015-12-23 NOTE — Patient Instructions (Signed)
[facility] Discharge Instructions for Patients Receiving Chemotherapy  Today you received the following chemotherapy agents Rituxan To help prevent nausea and vomiting after your treatment, we encourage you to take your nausea medication as prescribed.  If you develop nausea and vomiting that is not controlled by your nausea medication, call the clinic.   BELOW ARE SYMPTOMS THAT SHOULD BE REPORTED IMMEDIATELY:  *FEVER GREATER THAN 100.5 F  *CHILLS WITH OR WITHOUT FEVER  NAUSEA AND VOMITING THAT IS NOT CONTROLLED WITH YOUR NAUSEA MEDICATION  *UNUSUAL SHORTNESS OF BREATH  *UNUSUAL BRUISING OR BLEEDING  TENDERNESS IN MOUTH AND THROAT WITH OR WITHOUT PRESENCE OF ULCERS  *URINARY PROBLEMS  *BOWEL PROBLEMS  UNUSUAL RASH Items with * indicate a potential emergency and should be followed up as soon as possible.  Feel free to call the clinic you have any questions or concerns. The clinic phone number is [phone].  Please show the CHEMO ALERT CARD at check-in to the Emergency Department and triage nurse.   

## 2015-12-23 NOTE — Assessment & Plan Note (Signed)
The patient has Sjogren's and other autoimmune disorder. She also has arthritis which seems to be improving with rituximab. She had lung infiltrates that could be related to autoimmune disorder/lymphoma.  We will recheck PET/CT scan in 3 months for further assessment

## 2015-12-23 NOTE — Progress Notes (Signed)
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 Please refer to report for other findings   11/10/2015 Procedure she underwent CT guided biopsy   11/10/2015 Pathology Results Accession: [id number] right lung CT-guided biopsy suspicious for lymphoma   12/02/2015 - 12/23/2015 Chemotherapy She received 4 doses of weekly rituximab.    INTERVAL HISTORY: Please see below for problem oriented charting. She is seen prior to cycle 4 of treatment. She tolerated treatment very well without any side effects. She felt that her arthritis were improving.  REVIEW OF SYSTEMS:   Constitutional: Denies fevers, chills or abnormal weight loss Eyes: Denies blurriness of vision Ears, nose, mouth, throat, and face: Denies mucositis or sore throat Respiratory: Denies cough, dyspnea or wheezes Cardiovascular: Denies palpitation, chest discomfort or lower extremity swelling Gastrointestinal:  Denies nausea, heartburn or change in bowel habits Skin: Denies abnormal skin rashes Lymphatics: Denies new lymphadenopathy or easy bruising Neurological:Denies numbness, tingling or new weaknesses Behavioral/Psych: Mood is stable, no new changes  All other systems were reviewed with the patient and are negative.  I have reviewed the past medical history, past surgical history,  social history and family history with the patient and they are unchanged from previous note.  ALLERGIES:  is allergic to losartan; amoxicillin; beta adrenergic blockers; and clindamycin/lincomycin.  MEDICATIONS:  Current Outpatient Prescriptions  Medication Sig Dispense Refill  . Calcium Carbonate-Vitamin D (CALCIUM + D) 600-200 MG-UNIT TABS Take 2 tablets by mouth daily.      . hydroxypropyl methylcellulose / hypromellose (ISOPTO TEARS / GONIOVISC) 2.5 % ophthalmic solution Place 1 drop into both eyes 3 (three) times daily as needed for dry eyes.    Marland Kitchen POTASSIUM CHLORIDE PO Take 1 tablet by mouth daily. OTC    . simvastatin (ZOCOR) 10 MG tablet Take 1 tablet (10 mg total) by mouth at bedtime. 90 tablet 1  . Specialty Vitamins Products (MAGNESIUM, AMINO ACID CHELATE,) 133 MG tablet Take 1 tablet by mouth daily.    . vitamin B-12 (CYANOCOBALAMIN) 1000 MCG tablet Take 1,000 mcg by mouth daily.     No current facility-administered medications for this visit.    PHYSICAL EXAMINATION: ECOG PERFORMANCE STATUS: 0 - Asymptomatic  Filed Vitals:   12/23/15 1000  BP: 148/67  Pulse: 93  Temp: 98 F (36.7 C)  Resp: 18   Filed Weights   12/23/15 1000  Weight: 223 lb 9.6 oz (101.424 kg)    GENERAL:alert, no distress and comfortable SKIN: skin color, texture, turgor are normal, no rashes or significant lesions EYES: normal, Conjunctiva are pink and non-injected, sclera clear OROPHARYNX:no exudate, no erythema and lips, buccal mucosa, and tongue normal  NECK: Well-healed surgical scar on the right side. Mild thyroid enlargement.  LYMPH:  no palpable lymphadenopathy in the cervical, axillary or inguinal LUNGS: clear to auscultation and percussion with normal breathing effort, bilateral scattered wheezes. HEART: regular rate & rhythm and no murmurs and  no lower extremity edema ABDOMEN:abdomen soft, non-tender and normal bowel sounds Musculoskeletal:no cyanosis of digits and no clubbing   NEURO: alert & oriented x 3 with fluent speech, no focal motor/sensory deficits  LABORATORY DATA:  I have reviewed the data as listed    Component Value Date/Time   NA 139 11/24/2015 1113   NA 139 10/26/2013 1013   K 4.3 11/24/2015 1113   K 4.4 10/26/2013 1013   CL 104 10/26/2013 1013   CO2 27 11/24/2015 1113   CO2 28 10/26/2013 1013   GLUCOSE 109 11/24/2015 1113   GLUCOSE 106* 10/26/2013 1013   BUN 14.3 11/24/2015 1113   BUN 14 10/26/2013 1013   CREATININE 0.8 11/24/2015 1113   CREATININE 0.7 10/26/2013 1013   CALCIUM 10.4 11/24/2015 1113   CALCIUM 9.6 10/26/2013 1013   PROT 8.0 11/24/2015 1113   PROT 7.5 10/26/2013 1013   ALBUMIN 3.9 11/24/2015 1113   ALBUMIN 3.8 10/26/2013 1013   AST 27 11/24/2015 1113   AST 17 10/26/2013 1013   ALT 38 11/24/2015 1113   ALT 16 10/26/2013 1013   ALKPHOS 99 11/24/2015 1113   ALKPHOS 75 10/26/2013 1013   BILITOT <0.30 11/24/2015 1113   BILITOT 0.4 10/26/2013 1013   GFRNONAA 84* 02/02/2013 1200   GFRAA >90 02/02/2013 1200    No results found for: SPEP, UPEP  Lab Results  Component Value Date   WBC 7.2 11/24/2015   NEUTROABS 5.4 11/24/2015   HGB 12.7 11/24/2015   HCT 38.5 11/24/2015   MCV 87.0 11/24/2015   PLT 240 11/24/2015      Chemistry      Component Value Date/Time   NA 139 11/24/2015 1113   NA 139 10/26/2013 1013   K 4.3 11/24/2015 1113   K 4.4 10/26/2013 1013   CL 104 10/26/2013 1013   CO2 27 11/24/2015 1113   CO2 28 10/26/2013 1013   BUN 14.3 11/24/2015 1113   BUN 14 10/26/2013 1013   CREATININE 0.8 11/24/2015 1113   CREATININE 0.7 10/26/2013 1013      Component Value Date/Time   CALCIUM 10.4 11/24/2015 1113   CALCIUM 9.6 10/26/2013 1013   ALKPHOS 99 11/24/2015 1113   ALKPHOS 75 10/26/2013 1013   AST 27 11/24/2015 1113   AST 17 10/26/2013 1013   ALT 38 11/24/2015 1113   ALT 16 10/26/2013 1013   BILITOT <0.30 11/24/2015 1113   BILITOT 0.4 10/26/2013 1013      ASSESSMENT & PLAN:  Marginal zone  lymphoma of lymph nodes of multiple sites ([place]) She tolerated rituximab well. She will get her fourth dose today. I plan to recheck PET CT scan in 3 months before her return appointment.   SJOGREN'S SYNDROME The patient has Sjogren's and other autoimmune disorder. She also has arthritis which seems to be improving with rituximab. She had lung infiltrates that could be related to autoimmune disorder/lymphoma.  We will recheck PET/CT scan in 3 months for further assessment    Orders Placed This Encounter  Procedures  . NM PET Image Restag (PS) Skull Base To Thigh    Standing Status: Future     Number of Occurrences:      Standing Expiration Date: 02/21/2017    Order Specific Question:  Reason for Exam (SYMPTOM  OR DIAGNOSIS REQUIRED)    Answer:  staging lymphoma assess response to Rx    Order Specific Question:  Preferred imaging location?    Answer:  [facility]   All questions were  answered. The patient knows to call the clinic with any problems, questions or concerns. No barriers to learning was detected. I spent 15 minutes counseling the patient face to face. The total time spent in the appointment was 20 minutes and more than 50% was on counseling and review of test results     [facility], [place], MD 12/23/2015 10:23 AM

## 2015-12-23 NOTE — Assessment & Plan Note (Signed)
She tolerated rituximab well. She will get her fourth dose today. I plan to recheck PET CT scan in 3 months before her return appointment.

## 2015-12-27 DIAGNOSIS — D485 Neoplasm of uncertain behavior of skin: Secondary | ICD-10-CM | POA: Diagnosis not present

## 2015-12-27 DIAGNOSIS — L304 Erythema intertrigo: Secondary | ICD-10-CM | POA: Diagnosis not present

## 2015-12-27 DIAGNOSIS — C44311 Basal cell carcinoma of skin of nose: Secondary | ICD-10-CM | POA: Diagnosis not present

## 2016-01-12 ENCOUNTER — Telehealth: Payer: Self-pay | Admitting: Pulmonary Disease

## 2016-01-12 ENCOUNTER — Ambulatory Visit (INDEPENDENT_AMBULATORY_CARE_PROVIDER_SITE_OTHER): Payer: PPO | Admitting: Internal Medicine

## 2016-01-12 ENCOUNTER — Encounter: Payer: Self-pay | Admitting: Internal Medicine

## 2016-01-12 VITALS — BP 130/74 | HR 104 | Ht 69.0 in | Wt 221.8 lb

## 2016-01-12 DIAGNOSIS — J209 Acute bronchitis, unspecified: Secondary | ICD-10-CM | POA: Diagnosis not present

## 2016-01-12 DIAGNOSIS — C8588 Other specified types of non-Hodgkin lymphoma, lymph nodes of multiple sites: Secondary | ICD-10-CM | POA: Diagnosis not present

## 2016-01-12 MED ORDER — AZITHROMYCIN 250 MG PO TABS: ORAL_TABLET | ORAL | Status: DC

## 2016-01-12 NOTE — Assessment & Plan Note (Addendum)
Primary symptoms are wheezing and discolored sputum having recently completed chemotherapy. I doubt bacterial pneumonia. 4 consistent with an acute upper respiratory infection with tracheobronchitis Plan-Z-Pak, nebulizer treatment with Xopenex, Depo-Medrol

## 2016-01-12 NOTE — Telephone Encounter (Signed)
Called, spoke with pt.  Pt c/o cough x over 1 wk.  Over the past 2 days now having "labored breathing," rattling in chest which is worse qhs, and cough is now prod with greenish gray mucus.  No f/c/s.  Pt taking only tylenol for symptoms.  Does not have maintenance or rescue inhalers on med list.  Pt recently finished 1 month of chemo.  OV scheduled for today at 3:30 with CY.  Pt confirmed appt and is aware to seek emergency care if needed prior.  She verbalized understanding and is in agreement with plan.

## 2016-01-12 NOTE — Patient Instructions (Addendum)
Script sent for Zpak antibiotic  Neb xop 0.63     Dx acute asthmatic bronchitis  Depo 80  Suggest you make appointment to see Dr Ashok Cordia again in the next month or so

## 2016-01-12 NOTE — Progress Notes (Signed)
Subjective:    Patient ID: [name], female    DOB: Sep 01, 1940, 75 y.o.   MRN: [id number]  Cough  10/18/2015-Dr. Ashok Cordia Patient referred for evaluation of her right lower lobe opacity with hypermetabolic activity on PET/CT imaging as well as bilateral lung cysts. Patient with known history of Sjogren's syndrome not currently on treatment as well as early non-Hodgkin's lymphoma of lymph nodes of the head not currently on treatment. 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 She reports in the 1980s she was diagnosed with Sjogrens because of dry eyes and dry mouth. She reports she started noticing a cough 4 months ago both during the day and during the night. She reports cough is nonproductive. She denies any improvement or worsening of cough. She denies any inciting factors such as perfumes or exertion. She denies any wheezing. She denies any dyspnea at rest or exertion. She reports she has noticed a heaviness in her chest with exertion lately. She reports she has gained weight over the last 2 years. She has been walking 30 minutes daily over the last 2 weeks and has been gradually increasing her exercise. She denies any fever, chills, or sweats. No rashes or abnormal bruising. Rare epistaxis. She still has dry mouth & dry eyes. Denies any oral ulcers. She describes an abnormal sensation with swallowing in her neck but denies any dysphagia or odynophagia. She reports dyspepsia that is intermittent but denies any reflux or morning brash water taste otherwise. She reports joint pain & swelling in her MCP joints bilaterally. She does have chronic shooting pain in her hands as well as back pain that she attributes to her fibromyalgia.  01/12/2016-75 year old female never smoker followed for hypermetabolic right lower lobe nodule, bilateral lung cysts complicated by Sjogren's, non-Hodgkin's lymphoma, history stage I colon  cancer Acute Visit-[facility] patient; pt has had productive cough x 1 week-green in color. Past several weeks she has had wheezing and now having labored breathing. Pt states she is now being treated for 3 different types of cancer. Reports dry cough for several months. Briefly reviewed treatment status of multiple cancers. For past 3 days She has been noting "noisy breathing" meaning wheeze and some nasal stuffiness when lying quietly. Sputum has become green, questionable fever without sore throat. OTC cough medicine has been helpful.  Review of Systems  Respiratory: Positive for cough.   ROS-see HPI   Negative unless "+" Constitutional:    weight loss, night sweats, fevers, chills, fatigue, lassitude. HEENT:    headaches, difficulty swallowing, tooth/dental problems, sore throat,       sneezing, itching, ear ache, + nasal congestion, post nasal drip, snoring CV:    chest pain, orthopnea, PND, swelling in lower extremities, anasarca,                                                       dizziness, palpitations Resp:   + shortness of breath with exertion or at rest.                + productive cough,   non-productive cough, coughing up of blood.              + change in color of mucus.  wheezing.   Skin:    rash or  lesions. GI:  No-   heartburn, indigestion, abdominal pain, nausea, vomiting, diarrhea,                 change in bowel habits, loss of appetite GU: dysuria, change in color of urine, no urgency or frequency.   flank pain. MS:   joint pain, stiffness, decreased range of motion, back pain. Neuro-     nothing unusual Psych:  change in mood or affect.  depression or anxiety.   memory loss.  Allergies  Allergen Reactions  . Losartan Other (See Comments)    Causes somnolence  . Amoxicillin Rash  . Beta Adrenergic Blockers Diarrhea  . Clindamycin/Lincomycin Diarrhea and Rash    Current Outpatient Prescriptions on File Prior to Visit  Medication Sig Dispense Refill  . Calcium  Carbonate-Vitamin D (CALCIUM + D) 600-200 MG-UNIT TABS Take 2 tablets by mouth daily.      . hydroxypropyl methylcellulose / hypromellose (ISOPTO TEARS / GONIOVISC) 2.5 % ophthalmic solution Place 1 drop into both eyes 3 (three) times daily as needed for dry eyes.    Marland Kitchen POTASSIUM CHLORIDE PO Take 1 tablet by mouth daily. OTC    . simvastatin (ZOCOR) 10 MG tablet Take 1 tablet (10 mg total) by mouth at bedtime. 90 tablet 1  . Specialty Vitamins Products (MAGNESIUM, AMINO ACID CHELATE,) 133 MG tablet Take 1 tablet by mouth daily.    . vitamin B-12 (CYANOCOBALAMIN) 1000 MCG tablet Take 1,000 mcg by mouth daily.     No current facility-administered medications on file prior to visit.    Past Medical History  Diagnosis Date  . Hyperlipidemia   . Sjogren's disease ([place])   . Meningioma ([place]) 11/2009    right post parietal 69mm - stable 11/2009 MRI  . Fibromyalgia   . GERD (gastroesophageal reflux disease)   . Diverticulosis   . Arthritis     right thumb [facility] arthritis  . Nephrolithiasis   . Complication of anesthesia     has dry mouth anyway from shograns-sore throats post op  . Cancer ([place])     lymphoma  . 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[place]) 08/31/2015  . Lung infiltrate on CT 09/14/2015  . Hypertension   . Nephrolithiasis     Past Surgical History  Procedure Laterality Date  . Vaginal hysterectomy  1989  . Kidney stone surgery  1974  . Mass excision  2008    abd-benign  . Appendectomy  2008  . Tonsillectomy    . Colonoscopy    . Artery biopsy  02/03/2013    Procedure: MINOR BIOPSY TEMPORAL ARTERY;  Surgeon: Ascencion Dike, MD;  Location: [facility];  Service: ENT;;  . Parotidectomy Right 08/01/2015    Procedure: RIGHT PAROTIDECTOMY;  Surgeon: [name], MD;  Location: [facility];  Service: ENT;  Laterality: Right;  . Abdominal hysterectomy      Family History  Problem Relation Age of Onset  . Diabetes Neg Hx   . Stomach cancer Neg Hx   .  Colon cancer Mother 3  . Colon cancer Father     unknown when onset was    Social History   Social History  . Marital Status: Divorced    Spouse Name: N/A  . Number of Children: 2  . Years of Education: College   Occupational History  . Retired    Social History Main Topics  . Smoking status: Passive Smoke Exposure - Never Smoker  . Smokeless tobacco:  Never Used     Comment: Passive exposure through her mother.  . Alcohol Use: 0.0 oz/week    0 Standard drinks or equivalent per week     Comment: Rarely  . Drug Use: No  . Sexual Activity: Not Asked     Comment: [place] sister.   Other Topics Concern  . None   Social History Narrative   Pt lives at home alone.   Caffeine Use: 2 mugs every other day.       [facility]:   Originally from [place], [place]. She has also lived in [place], [place], & [place]. Internationally she has been to [place], [place], [place], & [place]. Previously has worked in Press [employer]. No chemical or fume exposures. No pets currently. No bird, mold, or hot tub exposure. 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       Objective:   Physical Exam BP 130/74 mmHg  Pulse 104  Ht 5\' 9"  (1.753 m)  Wt 221 lb 12.8 oz (100.608 kg)  BMI 32.74 kg/m2  SpO2 95% General- Alert, Oriented, Affect-appropriate, Distress- none acute, + talkative Skin- rash-none, lesions- none, excoriation- none Lymphadenopathy- none Head- atraumatic            Eyes- Gross vision intact, PERRLA, conjunctivae and secretions clear            Ears- Hearing, canals-normal            Nose-+ stuffy turbinate edema, no-Septal dev, mucus, polyps, erosion, perforation             Throat- Mallampati II , mucosa clear , drainage- none, tonsils- atrophic Neck- flexible , trachea midline, no stridor , thyroid nl, carotid no bruit Chest - symmetrical excursion , unlabored           Heart/CV- RRR , no murmur , no gallop  , no rub, nl s1  s2                           - JVD- none , edema- none, stasis changes- none, varices- none           Lung- clear to P&A, wheeze + bibasilar, cough- none , dullness-none, rub- none           Chest wall-  Abd-  Br/ Gen/ Rectal- Not done, not indicated Extrem- cyanosis- none, clubbing, none, atrophy- none, strength- nl Neuro- grossly intact to observation       Assessment & Plan:    4:06 PM 01/12/2016

## 2016-01-12 NOTE — Assessment & Plan Note (Signed)
Multiple malignancies being managed by oncology with potential for immunosuppression

## 2016-01-13 ENCOUNTER — Ambulatory Visit: Payer: PPO

## 2016-01-17 ENCOUNTER — Telehealth: Payer: Self-pay | Admitting: Internal Medicine

## 2016-01-17 NOTE — Telephone Encounter (Signed)
I would try to give it a litle more time,  Add mucinex DM Twice daily  As needed  Cough /congestion  If not improving or worse call back  Please contact office for sooner follow up if symptoms do not improve or worsen or seek emergency care

## 2016-01-17 NOTE — Telephone Encounter (Signed)
Patient notified of Tammy's recommendations. Nothing further needed.

## 2016-01-17 NOTE — Telephone Encounter (Signed)
Last ov with CY on 01/12/16  Patient Instructions       Script sent for Zpak antibiotic  Neb xop 0.63     Dx acute asthmatic bronchitis  Depo 80  Suggest you make appointment to see Dr Ashok Cordia again in the next month or so   Called spoke with pt. She states that she feels 95% better from last Thursday but she still c/o chest congestion and a prod cough with white mucus. She states she completed the zpak but feels that these symptoms are lingering and feels that something else should be called in. I explained to her that I would send the message to the doc of the day. She voiced understanding and had no further questions.  TP please advise

## 2016-01-25 ENCOUNTER — Ambulatory Visit: Payer: PPO

## 2016-01-25 DIAGNOSIS — Z85828 Personal history of other malignant neoplasm of skin: Secondary | ICD-10-CM | POA: Diagnosis not present

## 2016-01-25 DIAGNOSIS — C4401 Basal cell carcinoma of skin of lip: Secondary | ICD-10-CM | POA: Diagnosis not present

## 2016-01-27 ENCOUNTER — Ambulatory Visit
Admission: RE | Admit: 2016-01-27 | Discharge: 2016-01-27 | Disposition: A | Discharge status: Home / Self Care | Payer: PPO | Source: Ambulatory Visit

## 2016-01-27 DIAGNOSIS — Z1231 Encounter for screening mammogram for malignant neoplasm of breast: Secondary | ICD-10-CM

## 2016-02-01 ENCOUNTER — Telehealth: Payer: Self-pay | Admitting: *Deleted

## 2016-02-01 NOTE — Telephone Encounter (Signed)
Returned call to pt regarding c/o "something growing in my back."  She reports increasing soreness in Left upper back near ribcage getting more pronounced.  Says pain started at beginning of this year and has been "slowly not going away." She feels like "something strange is growing" there.  Pt asks if PET scan planned for August should be moved up sooner?  Pt also wanted Dr. 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   Informed pt will notify Dr. Alvy Bimler and call her back.  She verbalized understanding.

## 2016-02-02 ENCOUNTER — Other Ambulatory Visit: Payer: Self-pay | Admitting: Hematology and Oncology

## 2016-02-02 ENCOUNTER — Encounter: Payer: Self-pay | Admitting: *Deleted

## 2016-02-02 ENCOUNTER — Telehealth: Payer: Self-pay | Admitting: Hematology and Oncology

## 2016-02-02 NOTE — Telephone Encounter (Signed)
Request for PET/CT and MD appt to be moved up and prior auth done, given to darlena in managed care.

## 2016-02-02 NOTE — Telephone Encounter (Signed)
I have placed new POF to move her labs, PET to be done on 6/23 and see me 6/26. Dixie, you have to coordinate with [facility] or [facility] to make sure PET CT scan is approved and the call [facility] radiology to get her scan done on 6/23 if possible

## 2016-02-02 NOTE — Telephone Encounter (Signed)
s.w. pt and advised on June appt....pt ok and aware °

## 2016-02-02 NOTE — Telephone Encounter (Signed)
Per darlena in managed care, PET/CT has been approved for 6/23 and central scheduling has been notified of changes. POF in for dr visit 6/26

## 2016-02-09 ENCOUNTER — Other Ambulatory Visit: Payer: Self-pay | Admitting: Hematology and Oncology

## 2016-02-09 DIAGNOSIS — C8588 Other specified types of non-Hodgkin lymphoma, lymph nodes of multiple sites: Secondary | ICD-10-CM

## 2016-02-10 ENCOUNTER — Other Ambulatory Visit (HOSPITAL_BASED_OUTPATIENT_CLINIC_OR_DEPARTMENT_OTHER): Payer: PPO

## 2016-02-10 ENCOUNTER — Other Ambulatory Visit: Payer: PPO

## 2016-02-10 ENCOUNTER — Ambulatory Visit (HOSPITAL_COMMUNITY)
Admission: RE | Admit: 2016-02-10 | Discharge: 2016-02-10 | Disposition: A | Discharge status: Home / Self Care | Payer: PPO | Source: Ambulatory Visit | Attending: Hematology and Oncology | Admitting: Hematology and Oncology

## 2016-02-10 DIAGNOSIS — I700 Atherosclerosis of aorta: Secondary | ICD-10-CM | POA: Insufficient documentation

## 2016-02-10 DIAGNOSIS — R918 Other nonspecific abnormal finding of lung field: Secondary | ICD-10-CM | POA: Insufficient documentation

## 2016-02-10 DIAGNOSIS — C8588 Other specified types of non-Hodgkin lymphoma, lymph nodes of multiple sites: Secondary | ICD-10-CM | POA: Diagnosis not present

## 2016-02-10 DIAGNOSIS — C8590 Non-Hodgkin lymphoma, unspecified, unspecified site: Secondary | ICD-10-CM | POA: Diagnosis not present

## 2016-02-10 LAB — CBC WITH DIFFERENTIAL/PLATELET
BASO%: 0.5 % (ref 0.0–2.0)
Basophils Absolute: 0 10*3/uL (ref 0.0–0.1)
EOS%: 4.4 % (ref 0.0–7.0)
Eosinophils Absolute: 0.3 10*3/uL (ref 0.0–0.5)
HEMATOCRIT: 37.8 % (ref 34.8–46.6)
HEMOGLOBIN: 12.4 g/dL (ref 11.6–15.9)
LYMPH#: 0.5 10*3/uL — AB (ref 0.9–3.3)
LYMPH%: 6.2 % — ABNORMAL LOW (ref 14.0–49.7)
MCH: 28.9 pg (ref 25.1–34.0)
MCHC: 32.8 g/dL (ref 31.5–36.0)
MCV: 87.9 fL (ref 79.5–101.0)
MONO#: 0.7 10*3/uL (ref 0.1–0.9)
MONO%: 9.1 % (ref 0.0–14.0)
NEUT#: 6.1 10*3/uL (ref 1.5–6.5)
NEUT%: 79.8 % — AB (ref 38.4–76.8)
Platelets: 213 10*3/uL (ref 145–400)
RBC: 4.3 10*6/uL (ref 3.70–5.45)
RDW: 13.4 % (ref 11.2–14.5)
WBC: 7.6 10*3/uL (ref 3.9–10.3)

## 2016-02-10 LAB — COMPREHENSIVE METABOLIC PANEL
ALT: 27 U/L (ref 0–55)
AST: 19 U/L (ref 5–34)
Albumin: 3.6 g/dL (ref 3.5–5.0)
Alkaline Phosphatase: 106 U/L (ref 40–150)
Anion Gap: 9 mEq/L (ref 3–11)
BUN: 12.9 mg/dL (ref 7.0–26.0)
CALCIUM: 9.4 mg/dL (ref 8.4–10.4)
CHLORIDE: 107 meq/L (ref 98–109)
CO2: 25 meq/L (ref 22–29)
CREATININE: 0.7 mg/dL (ref 0.6–1.1)
EGFR: 81 mL/min/{1.73_m2} — ABNORMAL LOW (ref >90)
GLUCOSE: 95 mg/dL (ref 70–140)
Potassium: 4.2 mEq/L (ref 3.5–5.1)
Sodium: 140 mEq/L (ref 136–145)
Total Bilirubin: 0.38 mg/dL (ref 0.20–1.20)
Total Protein: 7.6 g/dL (ref 6.4–8.3)

## 2016-02-10 LAB — LACTATE DEHYDROGENASE: LDH: 166 U/L (ref 125–245)

## 2016-02-10 LAB — GLUCOSE, CAPILLARY: Glucose-Capillary: 94 mg/dL (ref 65–99)

## 2016-02-13 ENCOUNTER — Ambulatory Visit (HOSPITAL_BASED_OUTPATIENT_CLINIC_OR_DEPARTMENT_OTHER): Payer: PPO | Admitting: Hematology and Oncology

## 2016-02-13 VITALS — BP 157/55 | HR 94 | Temp 98.2°F | Resp 17 | Ht 69.0 in | Wt 217.3 lb

## 2016-02-13 DIAGNOSIS — R918 Other nonspecific abnormal finding of lung field: Secondary | ICD-10-CM

## 2016-02-13 DIAGNOSIS — R21 Rash and other nonspecific skin eruption: Secondary | ICD-10-CM

## 2016-02-13 DIAGNOSIS — R109 Unspecified abdominal pain: Secondary | ICD-10-CM

## 2016-02-13 DIAGNOSIS — C8588 Other specified types of non-Hodgkin lymphoma, lymph nodes of multiple sites: Secondary | ICD-10-CM

## 2016-02-14 ENCOUNTER — Encounter: Payer: Self-pay | Admitting: Hematology and Oncology

## 2016-02-14 DIAGNOSIS — R109 Unspecified abdominal pain: Secondary | ICD-10-CM | POA: Insufficient documentation

## 2016-02-14 DIAGNOSIS — R21 Rash and other nonspecific skin eruption: Secondary | ICD-10-CM | POA: Insufficient documentation

## 2016-02-14 DIAGNOSIS — R10A2 Flank pain, left side: Secondary | ICD-10-CM | POA: Insufficient documentation

## 2016-02-14 NOTE — Progress Notes (Signed)
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 Please refer to report for other findings   11/10/2015 Procedure she underwent CT guided biopsy   11/10/2015 Pathology Results Accession: [id number] right lung CT-guided biopsy suspicious for lymphoma   12/02/2015 - 12/23/2015 Chemotherapy She received 4 doses of weekly rituximab.   02/10/2016 PET scan There is no evidence for residual or recurrent hypermetabolic adenopathy    INTERVAL HISTORY: Please see below for problem oriented charting. She returns for further follow-up. She complained of intermittent flank pain. She is also concerned about intermittent skin rash under her breasts. 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 She was prescribed topical antifungal cream by her dermatologist but she is reluctant to fill the prescription because she does not do any work. She denies chest pain or shortness of breath  REVIEW OF SYSTEMS:   Constitutional: Denies fevers, chills or abnormal weight loss Eyes: Denies blurriness of vision Ears, nose, mouth, throat, and face: Denies mucositis or sore throat Respiratory: Denies cough, dyspnea or wheezes Cardiovascular: Denies palpitation, chest discomfort or lower extremity swelling Gastrointestinal:  Denies nausea, heartburn or change in  bowel habits Lymphatics: Denies new lymphadenopathy or easy bruising Neurological:Denies numbness, tingling or new weaknesses Behavioral/Psych: Mood is stable, no new changes  All other systems were reviewed with the patient and are negative.  I have reviewed the past medical history, past surgical history, social history and family history with the patient and they are unchanged from previous note.  ALLERGIES:  is allergic to losartan; amoxicillin; beta adrenergic blockers; and clindamycin/lincomycin.  MEDICATIONS:  Current Outpatient Prescriptions  Medication Sig Dispense Refill  . Calcium Carbonate-Vitamin D (CALCIUM + D) 600-200 MG-UNIT TABS Take 2 tablets by mouth daily.      . hydroxypropyl methylcellulose / hypromellose (ISOPTO TEARS / GONIOVISC) 2.5 % ophthalmic solution Place 1 drop into both eyes 3 (three) times daily as needed for dry eyes.    Marland Kitchen POTASSIUM CHLORIDE PO Take 1 tablet by mouth 3 (three) times daily. OTC    . simvastatin (ZOCOR) 10 MG tablet Take 1 tablet (10 mg total) by mouth at bedtime. 90 tablet 1  . Specialty Vitamins Products (MAGNESIUM, AMINO ACID CHELATE,) 133 MG tablet Take 1 tablet by mouth daily.    . vitamin B-12 (CYANOCOBALAMIN) 1000 MCG tablet Take 1,000 mcg by mouth daily.     No current facility-administered medications for this visit.    PHYSICAL EXAMINATION: ECOG PERFORMANCE STATUS: 1 - Symptomatic but completely ambulatory  Filed Vitals:   02/13/16 1321  BP: 157/55  Pulse: 94  Temp: 98.2 F (36.8 C)  Resp: 17   Filed Weights   02/13/16 1321  Weight: 217 lb 4.8 oz (98.567 kg)    GENERAL:alert, no distress and comfortable. She is moderately obese Musculoskeletal:no cyanosis of digits and no clubbing  NEURO: alert & oriented x 3 with fluent speech, no focal  motor/sensory deficits  LABORATORY DATA:  I have reviewed the data as listed    Component Value Date/Time   NA 140 02/10/2016 1132   NA 139 10/26/2013 1013   K 4.2 02/10/2016  1132   K 4.4 10/26/2013 1013   CL 104 10/26/2013 1013   CO2 25 02/10/2016 1132   CO2 28 10/26/2013 1013   GLUCOSE 95 02/10/2016 1132   GLUCOSE 106* 10/26/2013 1013   BUN 12.9 02/10/2016 1132   BUN 14 10/26/2013 1013   CREATININE 0.7 02/10/2016 1132   CREATININE 0.7 10/26/2013 1013   CALCIUM 9.4 02/10/2016 1132   CALCIUM 9.6 10/26/2013 1013   PROT 7.6 02/10/2016 1132   PROT 7.5 10/26/2013 1013   ALBUMIN 3.6 02/10/2016 1132   ALBUMIN 3.8 10/26/2013 1013   AST 19 02/10/2016 1132   AST 17 10/26/2013 1013   ALT 27 02/10/2016 1132   ALT 16 10/26/2013 1013   ALKPHOS 106 02/10/2016 1132   ALKPHOS 75 10/26/2013 1013   BILITOT 0.38 02/10/2016 1132   BILITOT 0.4 10/26/2013 1013   GFRNONAA 84* 02/02/2013 1200   GFRAA >90 02/02/2013 1200    No results found for: SPEP, UPEP  Lab Results  Component Value Date   WBC 7.6 02/10/2016   NEUTROABS 6.1 02/10/2016   HGB 12.4 02/10/2016   HCT 37.8 02/10/2016   MCV 87.9 02/10/2016   PLT 213 02/10/2016      Chemistry      Component Value Date/Time   NA 140 02/10/2016 1132   NA 139 10/26/2013 1013   K 4.2 02/10/2016 1132   K 4.4 10/26/2013 1013   CL 104 10/26/2013 1013   CO2 25 02/10/2016 1132   CO2 28 10/26/2013 1013   BUN 12.9 02/10/2016 1132   BUN 14 10/26/2013 1013   CREATININE 0.7 02/10/2016 1132   CREATININE 0.7 10/26/2013 1013      Component Value Date/Time   CALCIUM 9.4 02/10/2016 1132   CALCIUM 9.6 10/26/2013 1013   ALKPHOS 106 02/10/2016 1132   ALKPHOS 75 10/26/2013 1013   AST 19 02/10/2016 1132   AST 17 10/26/2013 1013   ALT 27 02/10/2016 1132   ALT 16 10/26/2013 1013   BILITOT 0.38 02/10/2016 1132   BILITOT 0.4 10/26/2013 1013       RADIOGRAPHIC STUDIES:I reviewed recent PET CT scan with the patient I have personally reviewed the radiological images as listed and agreed with the findings in the report.    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 I will forward the results to the pulmonologist and defer to them for further management  Skin rash The patient has chronic, intermittent rash under the skin fold on both sides of her breast She was prescribed antifungal treatment in the past but she is reluctant to fill the prescription prescribed to her by her dermatologist because she felt that it would not work. I recommend she follows instruction as directed by her dermatologist.  Left flank pain She has intermittent left flank pain of unknown etiology. I reviewed the PET CT scan with the patient's show  no evidence of disease to account for her symptoms. I suspect she may have muscular strain. I reassured her.    Orders Placed This Encounter  Procedures  . CBC with Differential/Platelet    Standing Status: Future     Number of Occurrences:      Standing Expiration Date: 03/19/2017  . Comprehensive metabolic panel    Standing Status: Future     Number of Occurrences:      Standing Expiration Date: 03/19/2017  . Lactate dehydrogenase (LDH)    Standing Status: Future     Number of Occurrences:      Standing Expiration Date: 03/19/2017   All questions were answered. The patient knows to call the clinic with any problems, questions or concerns. No barriers to learning was detected. I spent 15 minutes counseling the patient face to face. The total time spent in the appointment was 20 minutes and more than 50% was on counseling and review of test results     [facility], [place], MD 02/14/2016 8:24 AM

## 2016-02-14 NOTE — Assessment & Plan Note (Signed)
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 I will forward the results to the pulmonologist and defer to them for further management

## 2016-02-14 NOTE — Assessment & Plan Note (Signed)
The patient has chronic, intermittent rash under the skin fold on both sides of her breast She was prescribed antifungal treatment in the past but she is reluctant to fill the prescription prescribed to her by her dermatologist because she felt that it would not work. I recommend she follows instruction as directed by her dermatologist.

## 2016-02-14 NOTE — Assessment & Plan Note (Signed)
The patient has complete response to treatment both in the lung area and in the parotid region. I will see her back in 6 months for repeat history, physical examination and blood work only and to repeat imaging study in one year

## 2016-02-14 NOTE — Assessment & Plan Note (Signed)
She has intermittent left flank pain of unknown etiology. I reviewed the PET CT scan with the patient's show no evidence of disease to account for her symptoms. I suspect she may have muscular strain. I reassured her.

## 2016-03-07 DIAGNOSIS — R7303 Prediabetes: Secondary | ICD-10-CM | POA: Diagnosis not present

## 2016-03-07 DIAGNOSIS — E785 Hyperlipidemia, unspecified: Secondary | ICD-10-CM | POA: Diagnosis not present

## 2016-03-07 DIAGNOSIS — Z6831 Body mass index (BMI) 31.0-31.9, adult: Secondary | ICD-10-CM | POA: Diagnosis not present

## 2016-03-07 DIAGNOSIS — I10 Essential (primary) hypertension: Secondary | ICD-10-CM | POA: Diagnosis not present

## 2016-03-07 DIAGNOSIS — M797 Fibromyalgia: Secondary | ICD-10-CM | POA: Diagnosis not present

## 2016-03-07 DIAGNOSIS — M3500 Sicca syndrome, unspecified: Secondary | ICD-10-CM | POA: Diagnosis not present

## 2016-03-07 DIAGNOSIS — Z0000 Encounter for general adult medical examination without abnormal findings: Secondary | ICD-10-CM | POA: Diagnosis not present

## 2016-03-07 DIAGNOSIS — C8511 Unspecified B-cell lymphoma, lymph nodes of head, face, and neck: Secondary | ICD-10-CM | POA: Diagnosis not present

## 2016-03-09 ENCOUNTER — Encounter (HOSPITAL_COMMUNITY): Payer: Self-pay | Admitting: Emergency Medicine

## 2016-03-09 ENCOUNTER — Emergency Department (HOSPITAL_COMMUNITY): Payer: PPO

## 2016-03-09 ENCOUNTER — Emergency Department (HOSPITAL_COMMUNITY)
Admission: EM | Admit: 2016-03-09 | Discharge: 2016-03-09 | Disposition: A | Discharge status: Home / Self Care | Payer: PPO | Attending: Emergency Medicine | Admitting: Emergency Medicine

## 2016-03-09 DIAGNOSIS — E785 Hyperlipidemia, unspecified: Secondary | ICD-10-CM | POA: Diagnosis not present

## 2016-03-09 DIAGNOSIS — M1990 Unspecified osteoarthritis, unspecified site: Secondary | ICD-10-CM | POA: Diagnosis not present

## 2016-03-09 DIAGNOSIS — Z8572 Personal history of non-Hodgkin lymphomas: Secondary | ICD-10-CM | POA: Diagnosis not present

## 2016-03-09 DIAGNOSIS — Z7722 Contact with and (suspected) exposure to environmental tobacco smoke (acute) (chronic): Secondary | ICD-10-CM | POA: Diagnosis not present

## 2016-03-09 DIAGNOSIS — R079 Chest pain, unspecified: Secondary | ICD-10-CM | POA: Diagnosis not present

## 2016-03-09 DIAGNOSIS — I10 Essential (primary) hypertension: Secondary | ICD-10-CM | POA: Diagnosis not present

## 2016-03-09 DIAGNOSIS — Z79899 Other long term (current) drug therapy: Secondary | ICD-10-CM | POA: Diagnosis not present

## 2016-03-09 LAB — CBC
HCT: 37.6 % (ref 36.0–46.0)
HEMOGLOBIN: 12.3 g/dL (ref 12.0–15.0)
MCH: 29.6 pg (ref 26.0–34.0)
MCHC: 32.7 g/dL (ref 30.0–36.0)
MCV: 90.6 fL (ref 78.0–100.0)
Platelets: 233 10*3/uL (ref 150–400)
RBC: 4.15 MIL/uL (ref 3.87–5.11)
RDW: 13.2 % (ref 11.5–15.5)
WBC: 8.2 10*3/uL (ref 4.0–10.5)

## 2016-03-09 LAB — BASIC METABOLIC PANEL
ANION GAP: 8 (ref 5–15)
BUN: 17 mg/dL (ref 6–20)
CALCIUM: 9.6 mg/dL (ref 8.9–10.3)
CO2: 27 mmol/L (ref 22–32)
Chloride: 104 mmol/L (ref 101–111)
Creatinine, Ser: 0.72 mg/dL (ref 0.44–1.00)
Glucose, Bld: 106 mg/dL — ABNORMAL HIGH (ref 65–99)
POTASSIUM: 4.1 mmol/L (ref 3.5–5.1)
Sodium: 139 mmol/L (ref 135–145)

## 2016-03-09 LAB — I-STAT TROPONIN, ED
TROPONIN I, POC: 0 ng/mL (ref 0.00–0.08)
TROPONIN I, POC: 0 ng/mL (ref 0.00–0.08)

## 2016-03-09 MED ORDER — IOPAMIDOL (ISOVUE-370) INJECTION 76%
100.0000 mL | Freq: Once | INTRAVENOUS | Status: AC | PRN
  Administered 2016-03-09: 100 mL via INTRAVENOUS

## 2016-03-09 MED ORDER — ASPIRIN 81 MG PO CHEW
324.0000 mg | CHEWABLE_TABLET | Freq: Once | ORAL | Status: AC
  Administered 2016-03-09: 324 mg via ORAL
  Filled 2016-03-09: qty 4

## 2016-03-09 NOTE — ED Notes (Signed)
MD at bedside. 

## 2016-03-09 NOTE — ED Notes (Signed)
Per patient, states she woke up around 2 am with chest pain-saw PCP and had EKG done-sent here for further testing

## 2016-03-09 NOTE — ED Notes (Signed)
Patient transported to CT 

## 2016-03-09 NOTE — Discharge Instructions (Signed)
You were seen and evaluated today for your chest pain. At this time it does not appear to be an acute life-threatening event. Your results today have been reassuring. Please follow-up with your cardiologist outpatient. I talked to the cardiologist on call tonight and they took her information so hopefully their office will contact you but if they do not please call them. Return for sudden worsening of symptoms or new concerning symptoms including shortness of breath.   Chest Pain  Chest pain can be caused by many different conditions. There is always a chance that your pain could be related to something serious, such as a heart attack or a blood clot in your lungs. Chest pain can also be caused by conditions that are not life-threatening. If you have chest pain, it is very important to follow up with your health care provider. CAUSES  Chest pain can be caused by:  Heartburn.  Pneumonia or bronchitis.  Anxiety or stress.  Inflammation around your heart (pericarditis) or lung (pleuritis or pleurisy).  A blood clot in your lung.  A collapsed lung (pneumothorax). It can develop suddenly on its own (spontaneous pneumothorax) or from trauma to the chest.  Shingles infection (varicella-zoster virus).  Heart attack.  Damage to the bones, muscles, and cartilage that make up your chest wall. This can include:  Bruised bones due to injury.  Strained muscles or cartilage due to frequent or repeated coughing or overwork.  Fracture to one or more ribs.  Sore cartilage due to inflammation (costochondritis). RISK FACTORS  Risk factors for chest pain may include:  Activities that increase your risk for trauma or injury to your chest.  Respiratory infections or conditions that cause frequent coughing.  Medical conditions or overeating that can cause heartburn.  Heart disease or family history of heart disease.  Conditions or health behaviors that increase your risk of developing a blood  clot.  Having had chicken pox (varicella zoster). SIGNS AND SYMPTOMS Chest pain can feel like:  Burning or tingling on the surface of your chest or deep in your chest.  Crushing, pressure, aching, or squeezing pain.  Dull or sharp pain that is worse when you move, cough, or take a deep breath.  Pain that is also felt in your back, neck, shoulder, or arm, or pain that spreads to any of these areas. Your chest pain may come and go, or it may stay constant. DIAGNOSIS Lab tests or other studies may be needed to find the cause of your pain. Your health care provider may have you take a test called an ambulatory ECG (electrocardiogram). An ECG records your heartbeat patterns at the time the test is performed. You may also have other tests, such as:  Transthoracic echocardiogram (TTE). During echocardiography, sound waves are used to create a picture of all of the heart structures and to look at how blood flows through your heart.  Transesophageal echocardiogram (TEE).This is a more advanced imaging test that obtains images from inside your body. It allows your health care provider to see your heart in finer detail.  Cardiac monitoring. This allows your health care provider to monitor your heart rate and rhythm in real time.  Holter monitor. This is a portable device that records your heartbeat and can help to diagnose abnormal heartbeats. It allows your health care provider to track your heart activity for several days, if needed.  Stress tests. These can be done through exercise or by taking medicine that makes your heart beat more quickly.  Blood tests.  Imaging tests. TREATMENT  Your treatment depends on what is causing your chest pain. Treatment may include:  Medicines. These may include:  Acid blockers for heartburn.  Anti-inflammatory medicine.  Pain medicine for inflammatory conditions.  Antibiotic medicine, if an infection is present.  Medicines to dissolve blood  clots.  Medicines to treat coronary artery disease.  Supportive care for conditions that do not require medicines. This may include:  Resting.  Applying heat or cold packs to injured areas.  Limiting activities until pain decreases. HOME CARE INSTRUCTIONS  If you were prescribed an antibiotic medicine, finish it all even if you start to feel better.  Avoid any activities that bring on chest pain.  Do not use any tobacco products, including cigarettes, chewing tobacco, or electronic cigarettes. If you need help quitting, ask your health care provider.  Do not drink alcohol.  Take medicines only as directed by your health care provider.  Keep all follow-up visits as directed by your health care provider. This is important. This includes any further testing if your chest pain does not go away.  If heartburn is the cause for your chest pain, you may be told to keep your head raised (elevated) while sleeping. This reduces the chance that acid will go from your stomach into your esophagus.  Make lifestyle changes as directed by your health care provider. These may include:  Getting regular exercise. Ask your health care provider to suggest some activities that are safe for you.  Eating a heart-healthy diet. A registered dietitian can help you to learn healthy eating options.  Maintaining a healthy weight.  Managing diabetes, if necessary.  Reducing stress. SEEK MEDICAL CARE IF:  Your chest pain does not go away after treatment.  You have a rash with blisters on your chest.  You have a fever. SEEK IMMEDIATE MEDICAL CARE IF:   Your chest pain is worse.  You have an increasing cough, or you cough up blood.  You have severe abdominal pain.  You have severe weakness.  You faint.  You have chills.  You have sudden, unexplained chest discomfort.  You have sudden, unexplained discomfort in your arms, back, neck, or jaw.  You have shortness of breath at any  time.  You suddenly start to sweat, or your skin gets clammy.  You feel nauseous or you vomit.  You suddenly feel light-headed or dizzy.  Your heart begins to beat quickly, or it feels like it is skipping beats. These symptoms may represent a serious problem that is an emergency. Do not wait to see if the symptoms will go away. Get medical help right away. Call your local emergency services (911 in the [place] Do not drive yourself to the hospital.   This information is not intended to replace advice given to you by your health care provider. Make sure you discuss any questions you have with your health care provider.   Document Released: 05/16/2005 Document Revised: 08/27/2014 Document Reviewed: 03/12/2014 Elsevier Interactive Patient Education [employer].

## 2016-03-10 NOTE — ED Provider Notes (Signed)
CSN: [id number]     Arrival date & time 03/09/16  1758 History   First MD Initiated Contact with Patient 03/09/16 1908     Chief Complaint  Patient presents with  . Chest Pain     (Consider location/radiation/quality/duration/timing/severity/associated sxs/prior Treatment)  75 y.o. Female presents for chest pain.  The patient states that she woke up this morning with chest pain. She followed up with her PCP and was sent here for further testing. The patient states that the pain was a squeezing sensation in her left chest.  Denies radiation of the pain.  No shortness of breath.  No fevers, chills, cough.  No leg swelling.  REports pain has improved.  Patient does report being extremely nervous about the cost of her visit today as she owes money for previous treatment.      Past Medical History  Diagnosis Date  . Hyperlipidemia   . Sjogren's disease ([place])   . Meningioma ([place]) 11/2009    right post parietal 49mm - stable 11/2009 MRI  . Fibromyalgia   . GERD (gastroesophageal reflux disease)   . Diverticulosis   . Arthritis     right thumb [facility] arthritis  . Nephrolithiasis   . Complication of anesthesia     has dry mouth anyway from shograns-sore throats post op  . Cancer ([place])     lymphoma  . 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 Lung infiltrate on CT 09/14/2015  . Hypertension   . Nephrolithiasis    Past Surgical History  Procedure Laterality Date  . Vaginal hysterectomy  1989  . Kidney stone surgery  1974  . Mass excision  2008    abd-benign  . Appendectomy  2008  . Tonsillectomy    . Colonoscopy    . Artery biopsy  02/03/2013    Procedure: MINOR BIOPSY TEMPORAL ARTERY;  Surgeon: Ascencion Dike, MD;  Location: [facility];  Service: ENT;;  . Parotidectomy Right 08/01/2015    Procedure: RIGHT PAROTIDECTOMY;  Surgeon: [name], MD;  Location: [facility] [place];  Service: ENT;  Laterality: Right;  . Abdominal hysterectomy     Family  History  Problem Relation Age of Onset  . Diabetes Neg Hx   . Stomach cancer Neg Hx   . Colon cancer Mother 30  . Colon cancer Father     unknown when onset was   Social History  Substance Use Topics  . Smoking status: Passive Smoke Exposure - Never Smoker  . Smokeless tobacco: Never Used     Comment: Passive exposure through her mother.  . Alcohol Use: 0.0 oz/week    0 Standard drinks or equivalent per week     Comment: Rarely   OB History    No data available     Review of Systems  Constitutional: Negative for appetite change, fatigue and fever.  HENT: Negative for congestion, postnasal drip and rhinorrhea.   Eyes: Negative for discharge, redness and visual disturbance.  Respiratory: Negative for cough, chest tightness, shortness of breath and wheezing.   Cardiovascular: Positive for chest pain. Negative for palpitations and leg swelling.  Gastrointestinal: Negative for abdominal pain, constipation, nausea and vomiting.  Genitourinary: Negative for dysuria, hematuria and urgency.  Musculoskeletal: Negative for back pain and neck pain.  Skin: Negative for rash.  Neurological: Negative for dizziness, weakness, light-headedness and headaches.  Hematological: Does not bruise/bleed easily.      Allergies  Losartan; Amoxicillin; Beta adrenergic blockers; and Clindamycin/lincomycin  Home Medications   Prior to Admission medications   Medication Sig Start Date End Date Taking? Authorizing Provider  Calcium Carbonate-Vitamin D (CALCIUM + D) 600-200 MG-UNIT TABS Take 2 tablets by mouth daily.     Yes Historical Provider, MD  hydroxypropyl methylcellulose / hypromellose (ISOPTO TEARS / GONIOVISC) 2.5 % ophthalmic solution Place 1 drop into both eyes 3 (three) times daily as needed for dry eyes.   Yes Historical Provider, MD  POTASSIUM CHLORIDE PO Take 1 tablet by mouth 3 (three) times daily. OTC   Yes Historical Provider, MD  simvastatin (ZOCOR) 10 MG tablet Take 1 tablet (10 mg  total) by mouth at bedtime. 03/01/14  Yes Doe-Hyun Kyra Searles, DO  Specialty Vitamins Products (MAGNESIUM, AMINO ACID CHELATE,) 133 MG tablet Take 1 tablet by mouth daily.   Yes Historical Provider, MD  vitamin B-12 (CYANOCOBALAMIN) 1000 MCG tablet Take 1,000 mcg by mouth daily.   Yes Historical Provider, MD   BP 155/67 mmHg  Pulse 76  Resp 18  SpO2 95% Physical Exam  Constitutional: She is oriented to person, place, and time. She appears well-developed and well-nourished. No distress.  HENT:  Head: Normocephalic and atraumatic.  Right Ear: External ear normal.  Left Ear: External ear normal.  Nose: Nose normal.  Mouth/Throat: Oropharynx is clear and moist. No oropharyngeal exudate.  Eyes: EOM are normal. Pupils are equal, round, and reactive to light.  Neck: Normal range of motion. Neck supple.  Cardiovascular: Normal rate, regular rhythm, normal heart sounds and intact distal pulses.   No murmur heard. Pulmonary/Chest: Effort normal. No respiratory distress. She has no wheezes. She has no rales.  Abdominal: Soft. She exhibits no distension. There is no tenderness.  Musculoskeletal: Normal range of motion. She exhibits no edema or tenderness.  Neurological: She is alert and oriented to person, place, and time.  Skin: Skin is warm and dry. No rash noted. She is not diaphoretic.  Vitals reviewed.   ED Course  Procedures (including critical care time) Labs Review Labs Reviewed  BASIC METABOLIC PANEL - Abnormal; Notable for the following:    Glucose, Bld 106 (*)    All other components within normal limits  CBC  I-STAT TROPOININ, ED  I-STAT TROPOININ, ED    Imaging Review Ct Angio Chest Pe W/cm &/or Wo Cm  03/09/2016  CLINICAL DATA:  Chest pain. EXAM: CT ANGIOGRAPHY CHEST WITH CONTRAST TECHNIQUE: Multidetector CT imaging of the chest was performed using the standard protocol during bolus administration of intravenous contrast. Multiplanar CT image reconstructions and MIPs were  obtained to evaluate the vascular anatomy. CONTRAST:  100 mL of Isovue 370 COMPARISON:  CT of the chest October 26, 2015 FINDINGS: Central airways are normal. No pneumothorax. The nodule in the medial right lung base on series 11, image 54 with cystic and solid components is unchanged in size when compared to the PET-CT from September 13, 2015 measuring 18 x 13 mm today versus 19 x 16 mm previously. The small difference is probably due to difference in slice selection. The ground-glass immediately adjacent to this nodule is less prominent on today's study, possibly due to difference in technique. The ground-glass nodule posteriorly in the right lung base seen on previous imaging is less conspicuous today as well, possibly technical in nature. No other solid nodules are seen within the lungs. Multiple thin-walled lung cysts are again noted and unchanged. Mixed attenuation of the lung parenchyma likely represents sequela of air trapping seen on the high-resolution CT scan. There  is a small hiatal hernia. A small lymph node adjacent to the hiatal hernia is unchanged since the previous PET-CT. No suspicious or new adenopathy seen in the chest. Atherosclerotic change is seen in the thoracic aorta with no aneurysm or dissection. No effusions. No changes in the heart. No pulmonary emboli. Evaluation of the upper abdomen is unchanged and unremarkable. Visualized bones demonstrate degenerative changes in the spine with no other abnormalities. Review of the MIP images confirms the above findings. IMPRESSION: 1. The masslike area of concern in the medial right lung base seen on the previous study is unchanged in size since March 2017. A biopsy was previously recommended after the CT scan from October 26, 2015. Please see that report for further recommendations as the finding was better characterized at that time. 2. No pulmonary emboli. 3. Multiple thin walled cysts in the mid and lower lungs is stable. 4. 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 Electronically Signed   By: Dorise Bullion III M.D   On: 03/09/2016 21:15   I have personally reviewed and evaluated these images and lab results as part of my medical decision-making.   EKG Interpretation   Date/Time:  Friday March 09 2016 18:08:32 EDT Ventricular Rate:  97 PR Interval:    QRS Duration: 94 QT Interval:  358 QTC Calculation: 455 R Axis:   0 Text Interpretation:  Sinus rhythm Low voltage, extremity leads Abnormal  inferior Q waves More prominent Q waves in inferior leads Confirmed by  Saheed Carrington ([id number]) on 03/09/2016 7:08:51 PM      MDM  Patient seen and evaluated in stable condition.  Somewhat atypical pain for ACS.  Patient with q waves on EKG but they were present previously although slightly more prominent at this time.  Troponin normal x2.  CTA without acute process although consistent with previous abnormalities.  Discussed case with cardiologist on call Meda Coffee) who said that the cardiology office will get in contact with patient for outpatient cardiac work up.  Patient discharged home in stable condition with strict return precautions. Final diagnoses:  Chest pain, unspecified chest pain type      Harvel Quale, MD 03/16/16 1113

## 2016-04-03 ENCOUNTER — Other Ambulatory Visit: Payer: PPO

## 2016-04-03 ENCOUNTER — Ambulatory Visit: Payer: PPO | Admitting: Hematology and Oncology

## 2016-04-06 ENCOUNTER — Ambulatory Visit: Payer: PPO | Admitting: Physician Assistant

## 2016-05-01 DIAGNOSIS — Z23 Encounter for immunization: Secondary | ICD-10-CM | POA: Diagnosis not present

## 2016-07-02 DIAGNOSIS — H52203 Unspecified astigmatism, bilateral: Secondary | ICD-10-CM | POA: Diagnosis not present

## 2016-07-02 DIAGNOSIS — H25043 Posterior subcapsular polar age-related cataract, bilateral: Secondary | ICD-10-CM | POA: Diagnosis not present

## 2016-08-06 ENCOUNTER — Ambulatory Visit (HOSPITAL_BASED_OUTPATIENT_CLINIC_OR_DEPARTMENT_OTHER): Payer: PPO | Admitting: Hematology and Oncology

## 2016-08-06 ENCOUNTER — Other Ambulatory Visit (HOSPITAL_BASED_OUTPATIENT_CLINIC_OR_DEPARTMENT_OTHER): Payer: PPO

## 2016-08-06 ENCOUNTER — Encounter: Payer: Self-pay | Admitting: Hematology and Oncology

## 2016-08-06 ENCOUNTER — Telehealth: Payer: Self-pay | Admitting: Hematology and Oncology

## 2016-08-06 VITALS — BP 162/56 | HR 93 | Temp 98.1°F | Resp 18 | Wt 217.8 lb

## 2016-08-06 DIAGNOSIS — M3500 Sicca syndrome, unspecified: Secondary | ICD-10-CM

## 2016-08-06 DIAGNOSIS — Z85038 Personal history of other malignant neoplasm of large intestine: Secondary | ICD-10-CM | POA: Diagnosis not present

## 2016-08-06 DIAGNOSIS — C8588 Other specified types of non-Hodgkin lymphoma, lymph nodes of multiple sites: Secondary | ICD-10-CM

## 2016-08-06 LAB — CBC WITH DIFFERENTIAL/PLATELET
BASO%: 0.3 % (ref 0.0–2.0)
Basophils Absolute: 0 10*3/uL (ref 0.0–0.1)
EOS%: 4.6 % (ref 0.0–7.0)
Eosinophils Absolute: 0.3 10*3/uL (ref 0.0–0.5)
HCT: 37.5 % (ref 34.8–46.6)
HEMOGLOBIN: 12.3 g/dL (ref 11.6–15.9)
LYMPH%: 14 % (ref 14.0–49.7)
MCH: 29 pg (ref 25.1–34.0)
MCHC: 32.8 g/dL (ref 31.5–36.0)
MCV: 88.4 fL (ref 79.5–101.0)
MONO#: 0.6 10*3/uL (ref 0.1–0.9)
MONO%: 9.7 % (ref 0.0–14.0)
NEUT%: 71.4 % (ref 38.4–76.8)
NEUTROS ABS: 4.7 10*3/uL (ref 1.5–6.5)
Platelets: 234 10*3/uL (ref 145–400)
RBC: 4.24 10*6/uL (ref 3.70–5.45)
RDW: 12.8 % (ref 11.2–14.5)
WBC: 6.6 10*3/uL (ref 3.9–10.3)
lymph#: 0.9 10*3/uL (ref 0.9–3.3)

## 2016-08-06 LAB — COMPREHENSIVE METABOLIC PANEL
ALT: 25 U/L (ref 0–55)
AST: 21 U/L (ref 5–34)
Albumin: 3.6 g/dL (ref 3.5–5.0)
Alkaline Phosphatase: 108 U/L (ref 40–150)
Anion Gap: 9 mEq/L (ref 3–11)
BILIRUBIN TOTAL: 0.27 mg/dL (ref 0.20–1.20)
BUN: 15.4 mg/dL (ref 7.0–26.0)
CO2: 25 meq/L (ref 22–29)
Calcium: 10.1 mg/dL (ref 8.4–10.4)
Chloride: 105 mEq/L (ref 98–109)
Creatinine: 0.8 mg/dL (ref 0.6–1.1)
EGFR: 76 mL/min/{1.73_m2} — AB (ref >90)
GLUCOSE: 122 mg/dL (ref 70–140)
Potassium: 4.2 mEq/L (ref 3.5–5.1)
SODIUM: 140 meq/L (ref 136–145)
TOTAL PROTEIN: 7.7 g/dL (ref 6.4–8.3)

## 2016-08-06 LAB — LACTATE DEHYDROGENASE: LDH: 143 U/L (ref 125–245)

## 2016-08-06 NOTE — Assessment & Plan Note (Signed)
The patient has complete response to treatment both in the lung area and in the parotid region. I will see her back in 6 months for repeat history, physical examination and blood work and repeat imaging study

## 2016-08-06 NOTE — Assessment & Plan Note (Signed)
The patient has Sjogren's and other autoimmune disorder. She also has arthritis which seems to be improving with rituximab.

## 2016-08-06 NOTE — Assessment & Plan Note (Signed)
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 Her most recent colonoscopy were within normal limits. She does not require further adjuvant treatment.

## 2016-08-06 NOTE — Progress Notes (Signed)
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 Please refer to report for other findings      11/10/2015 Procedure    she underwent CT guided biopsy      11/10/2015 Pathology Results    Accession: [id number] right lung CT-guided biopsy suspicious for lymphoma      12/02/2015 - 12/23/2015 Chemotherapy    She received 4 doses of weekly rituximab.      02/10/2016 PET scan    There is no evidence for residual or recurrent hypermetabolic adenopathy      [id number] Imaging    The masslike area of concern in the medial right lung base seen on the previous study is unchanged in size since March 2017. A biopsy was previously recommended after the CT scan from October 26, 2015. Please see that report for further recommendations as the finding was better characterized at that time. 2. No pulmonary emboli. 3. Multiple thin walled cysts in the mid and lower lungs is stable. 4. 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       INTERVAL HISTORY: Please see below for problem oriented charting.. She returns for follow-up. She feels well. No new lymphadenopathy. Denies recent infection. No recent cough  REVIEW OF SYSTEMS:   Constitutional: Denies fevers, chills or abnormal weight loss Eyes: Denies blurriness of  vision Ears, nose, mouth, throat, and face: Denies mucositis or sore throat Respiratory: Denies cough, dyspnea or wheezes Cardiovascular: Denies palpitation, chest discomfort or lower extremity swelling Gastrointestinal:  Denies nausea, heartburn or change in bowel habits Skin: Denies abnormal skin rashes Lymphatics: Denies new lymphadenopathy or easy bruising Neurological:Denies numbness, tingling or new weaknesses Behavioral/Psych: Mood is stable, no new changes  All other systems were reviewed with the patient and are negative.  I have reviewed the past medical history, past surgical history, social history and family history with the patient and they are unchanged from previous note.  ALLERGIES:  is allergic to losartan; amoxicillin; beta adrenergic blockers; and clindamycin/lincomycin.  MEDICATIONS:  Current Outpatient Prescriptions  Medication Sig Dispense Refill  . Calcium Carbonate-Vitamin D (CALCIUM + D) 600-200 MG-UNIT TABS Take 2 tablets by mouth daily.      . hydroxypropyl methylcellulose / hypromellose (ISOPTO TEARS / GONIOVISC) 2.5 % ophthalmic solution Place 1 drop into both eyes 3 (three) times daily as needed for dry eyes.    Marland Kitchen POTASSIUM CHLORIDE PO Take 1 tablet by mouth 3 (three) times daily. OTC    . simvastatin (ZOCOR) 10 MG tablet Take 1 tablet (10 mg total) by mouth at bedtime. 90 tablet 1  . Specialty Vitamins Products (MAGNESIUM, AMINO ACID CHELATE,) 133 MG tablet Take 1 tablet by mouth daily.    . vitamin B-12 (CYANOCOBALAMIN) 1000 MCG tablet Take 1,000 mcg by mouth daily.     No current facility-administered medications  for this visit.     PHYSICAL EXAMINATION: ECOG PERFORMANCE STATUS: 0 - Asymptomatic  Vitals:   08/06/16 0936  BP: (!) 162/56  Pulse: 93  Resp: 18  Temp: 98.1 F (36.7 C)   Filed Weights   08/06/16 0936  Weight: 217 lb 12.8 oz (98.8 kg)    GENERAL:alert, no distress and comfortable SKIN: skin color, texture, turgor are normal, no  rashes or significant lesions EYES: normal, Conjunctiva are pink and non-injected, sclera clear OROPHARYNX:no exudate, no erythema and lips, buccal mucosa, and tongue normal  NECK: supple, thyroid normal size, non-tender, without nodularity LYMPH:  no palpable lymphadenopathy in the cervical, axillary or inguinal LUNGS: clear to auscultation and percussion with normal breathing effort HEART: regular rate & rhythm and no murmurs and no lower extremity edema ABDOMEN:abdomen soft, non-tender and normal bowel sounds Musculoskeletal:no cyanosis of digits and no clubbing  NEURO: alert & oriented x 3 with fluent speech, no focal motor/sensory deficits  LABORATORY DATA:  I have reviewed the data as listed    Component Value Date/Time   NA 139 03/09/2016 1813   NA 140 02/10/2016 1132   K 4.1 03/09/2016 1813   K 4.2 02/10/2016 1132   CL 104 03/09/2016 1813   CO2 27 03/09/2016 1813   CO2 25 02/10/2016 1132   GLUCOSE 106 (H) 03/09/2016 1813   GLUCOSE 95 02/10/2016 1132   BUN 17 03/09/2016 1813   BUN 12.9 02/10/2016 1132   CREATININE 0.72 03/09/2016 1813   CREATININE 0.7 02/10/2016 1132   CALCIUM 9.6 03/09/2016 1813   CALCIUM 9.4 02/10/2016 1132   PROT 7.6 02/10/2016 1132   ALBUMIN 3.6 02/10/2016 1132   AST 19 02/10/2016 1132   ALT 27 02/10/2016 1132   ALKPHOS 106 02/10/2016 1132   BILITOT 0.38 02/10/2016 1132   GFRNONAA >60 03/09/2016 1813   GFRAA >60 03/09/2016 1813    No results found for: SPEP, UPEP  Lab Results  Component Value Date   WBC 6.6 08/06/2016   NEUTROABS 4.7 08/06/2016   HGB 12.3 08/06/2016   HCT 37.5 08/06/2016   MCV 88.4 08/06/2016   PLT 234 08/06/2016      Chemistry      Component Value Date/Time   NA 139 03/09/2016 1813   NA 140 02/10/2016 1132   K 4.1 03/09/2016 1813   K 4.2 02/10/2016 1132   CL 104 03/09/2016 1813   CO2 27 03/09/2016 1813   CO2 25 02/10/2016 1132   BUN 17 03/09/2016 1813   BUN 12.9 02/10/2016 1132   CREATININE 0.72 03/09/2016  1813   CREATININE 0.7 02/10/2016 1132      Component Value Date/Time   CALCIUM 9.6 03/09/2016 1813   CALCIUM 9.4 02/10/2016 1132   ALKPHOS 106 02/10/2016 1132   AST 19 02/10/2016 1132   ALT 27 02/10/2016 1132   BILITOT 0.38 02/10/2016 1132      ASSESSMENT & PLAN:  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 Her most recent colonoscopy were within normal limits. She does not require further adjuvant treatment.  SJOGREN'S SYNDROME The patient has Sjogren's and other autoimmune disorder. She also has arthritis which seems  to be improving with rituximab.   Orders Placed This Encounter  Procedures  . NM PET Image Restag (PS) Skull Base To Thigh    Standing Status:   Future    Standing Expiration Date:   09/10/2017    Order Specific Question:   Reason for exam:    Answer:   lymphoma,exclude recurrence    Order Specific Question:   Preferred imaging location?    Answer:   [facility]  . CBC with Differential/Platelet    Standing Status:   Future    Standing Expiration Date:   09/10/2017  . Comprehensive metabolic panel    Standing Status:   Future    Standing Expiration Date:   09/10/2017  . Lactate dehydrogenase    Standing Status:   Future    Standing Expiration Date:   09/10/2017   All questions were answered. The patient knows to call the clinic with any problems, questions or concerns. No barriers to learning was detected. I spent 15 minutes counseling the patient face to face. The total time spent in the appointment was 20 minutes and more than 50% was on counseling and review of test results     Heath Lark,  MD 08/06/2016 9:56 AM

## 2016-08-06 NOTE — Telephone Encounter (Signed)
Gave patient avs report and appointments for June. Central radiology will call re scan.  °

## 2017-01-09 ENCOUNTER — Encounter: Payer: Self-pay | Admitting: Internal Medicine

## 2017-01-16 DIAGNOSIS — D225 Melanocytic nevi of trunk: Secondary | ICD-10-CM | POA: Diagnosis not present

## 2017-01-16 DIAGNOSIS — L570 Actinic keratosis: Secondary | ICD-10-CM | POA: Diagnosis not present

## 2017-01-28 ENCOUNTER — Ambulatory Visit (AMBULATORY_SURGERY_CENTER): Payer: Self-pay | Admitting: *Deleted

## 2017-01-28 VITALS — Ht 69.0 in | Wt 217.0 lb

## 2017-01-28 DIAGNOSIS — Z800 Family history of malignant neoplasm of digestive organs: Secondary | ICD-10-CM

## 2017-01-28 DIAGNOSIS — Z85038 Personal history of other malignant neoplasm of large intestine: Secondary | ICD-10-CM

## 2017-01-28 MED ORDER — NA SULFATE-K SULFATE-MG SULF 17.5-3.13-1.6 GM/177ML PO SOLN: 1.0000 | Freq: Once | ORAL | 0 refills | Status: AC

## 2017-01-28 NOTE — Progress Notes (Signed)
No egg or soy allergy known to patient   issues with past sedation with any surgeries  or procedures of very dry mouth due to Sjrogrens, no intubation problems  No diet pills per patient No home 02 use per patient  No blood thinners per patient  Pt denies issues with constipation  No A fib or A flutter  EMMI video declined Pay no more than $ 50 coupon to pt for suprep

## 2017-01-29 DIAGNOSIS — Z1231 Encounter for screening mammogram for malignant neoplasm of breast: Secondary | ICD-10-CM | POA: Diagnosis not present

## 2017-01-30 ENCOUNTER — Telehealth: Payer: Self-pay

## 2017-01-30 ENCOUNTER — Encounter: Payer: Self-pay | Admitting: Internal Medicine

## 2017-01-30 NOTE — Telephone Encounter (Signed)
Radiology Scheduler called and will call patient and schedule PET scan.

## 2017-01-30 NOTE — Telephone Encounter (Signed)
-----   Message from Loletta Parish sent at 01/30/2017 12:13 PM EDT ----- Regarding: RE: PET scan not scheduled PET Authorized.  Thanks  ----- Message ----- From: Heath Lark, MD Sent: 01/30/2017  11:59 AM To: Flo Shanks, RN, Loletta Parish Subject: PET scan not scheduled

## 2017-02-06 ENCOUNTER — Other Ambulatory Visit: Payer: PPO

## 2017-02-06 ENCOUNTER — Ambulatory Visit (HOSPITAL_COMMUNITY)
Admission: RE | Admit: 2017-02-06 | Discharge: 2017-02-06 | Disposition: A | Discharge status: Home / Self Care | Payer: PPO | Source: Ambulatory Visit | Attending: Hematology and Oncology | Admitting: Hematology and Oncology

## 2017-02-06 ENCOUNTER — Other Ambulatory Visit (HOSPITAL_BASED_OUTPATIENT_CLINIC_OR_DEPARTMENT_OTHER): Payer: PPO

## 2017-02-06 DIAGNOSIS — I700 Atherosclerosis of aorta: Secondary | ICD-10-CM | POA: Insufficient documentation

## 2017-02-06 DIAGNOSIS — C8588 Other specified types of non-Hodgkin lymphoma, lymph nodes of multiple sites: Secondary | ICD-10-CM | POA: Diagnosis not present

## 2017-02-06 DIAGNOSIS — R918 Other nonspecific abnormal finding of lung field: Secondary | ICD-10-CM | POA: Diagnosis not present

## 2017-02-06 DIAGNOSIS — C8580 Other specified types of non-Hodgkin lymphoma, unspecified site: Secondary | ICD-10-CM | POA: Diagnosis not present

## 2017-02-06 DIAGNOSIS — K5750 Diverticulosis of both small and large intestine without perforation or abscess without bleeding: Secondary | ICD-10-CM | POA: Diagnosis not present

## 2017-02-06 DIAGNOSIS — K760 Fatty (change of) liver, not elsewhere classified: Secondary | ICD-10-CM | POA: Diagnosis not present

## 2017-02-06 DIAGNOSIS — Z90710 Acquired absence of both cervix and uterus: Secondary | ICD-10-CM | POA: Insufficient documentation

## 2017-02-06 LAB — CBC WITH DIFFERENTIAL/PLATELET
BASO%: 0.6 % (ref 0.0–2.0)
Basophils Absolute: 0 10*3/uL (ref 0.0–0.1)
EOS ABS: 0.3 10*3/uL (ref 0.0–0.5)
EOS%: 4.4 % (ref 0.0–7.0)
HEMATOCRIT: 37.9 % (ref 34.8–46.6)
HEMOGLOBIN: 12.7 g/dL (ref 11.6–15.9)
LYMPH#: 0.9 10*3/uL (ref 0.9–3.3)
LYMPH%: 12.6 % — AB (ref 14.0–49.7)
MCH: 29.4 pg (ref 25.1–34.0)
MCHC: 33.4 g/dL (ref 31.5–36.0)
MCV: 87.9 fL (ref 79.5–101.0)
MONO#: 0.7 10*3/uL (ref 0.1–0.9)
MONO%: 10.4 % (ref 0.0–14.0)
NEUT%: 72 % (ref 38.4–76.8)
NEUTROS ABS: 4.9 10*3/uL (ref 1.5–6.5)
PLATELETS: 220 10*3/uL (ref 145–400)
RBC: 4.32 10*6/uL (ref 3.70–5.45)
RDW: 13.6 % (ref 11.2–14.5)
WBC: 6.8 10*3/uL (ref 3.9–10.3)

## 2017-02-06 LAB — LACTATE DEHYDROGENASE: LDH: 138 U/L (ref 125–245)

## 2017-02-06 LAB — COMPREHENSIVE METABOLIC PANEL
ALBUMIN: 3.9 g/dL (ref 3.5–5.0)
ALK PHOS: 105 U/L (ref 40–150)
ALT: 25 U/L (ref 0–55)
ANION GAP: 13 meq/L — AB (ref 3–11)
AST: 20 U/L (ref 5–34)
BILIRUBIN TOTAL: 0.41 mg/dL (ref 0.20–1.20)
BUN: 14.2 mg/dL (ref 7.0–26.0)
CALCIUM: 9.7 mg/dL (ref 8.4–10.4)
CO2: 26 meq/L (ref 22–29)
CREATININE: 0.8 mg/dL (ref 0.6–1.1)
Chloride: 104 mEq/L (ref 98–109)
EGFR: 75 mL/min/{1.73_m2} — AB (ref >90)
Glucose: 100 mg/dl (ref 70–140)
Potassium: 4.8 mEq/L (ref 3.5–5.1)
Sodium: 143 mEq/L (ref 136–145)
TOTAL PROTEIN: 7.8 g/dL (ref 6.4–8.3)

## 2017-02-06 LAB — GLUCOSE, CAPILLARY: Glucose-Capillary: 102 mg/dL — ABNORMAL HIGH (ref 65–99)

## 2017-02-06 MED ORDER — FLUDEOXYGLUCOSE F - 18 (FDG) INJECTION
12.5000 | Freq: Once | INTRAVENOUS | Status: AC | PRN
  Administered 2017-02-06: 12.5 via INTRAVENOUS

## 2017-02-07 ENCOUNTER — Telehealth: Payer: Self-pay | Admitting: Internal Medicine

## 2017-02-07 ENCOUNTER — Telehealth: Payer: Self-pay | Admitting: Hematology and Oncology

## 2017-02-07 ENCOUNTER — Encounter: Payer: Self-pay | Admitting: Hematology and Oncology

## 2017-02-07 ENCOUNTER — Ambulatory Visit (HOSPITAL_BASED_OUTPATIENT_CLINIC_OR_DEPARTMENT_OTHER): Payer: PPO | Admitting: Hematology and Oncology

## 2017-02-07 VITALS — BP 173/57 | HR 87 | Temp 98.6°F | Resp 18 | Ht 69.0 in | Wt 218.8 lb

## 2017-02-07 DIAGNOSIS — R918 Other nonspecific abnormal finding of lung field: Secondary | ICD-10-CM

## 2017-02-07 DIAGNOSIS — C8588 Other specified types of non-Hodgkin lymphoma, lymph nodes of multiple sites: Secondary | ICD-10-CM

## 2017-02-07 DIAGNOSIS — Z85038 Personal history of other malignant neoplasm of large intestine: Secondary | ICD-10-CM | POA: Diagnosis not present

## 2017-02-07 DIAGNOSIS — R109 Unspecified abdominal pain: Secondary | ICD-10-CM | POA: Diagnosis not present

## 2017-02-07 NOTE — Assessment & Plan Note (Signed)
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 Her most recent colonoscopy were within normal limits. She does not require further adjuvant treatment. PET scan is negative 

## 2017-02-07 NOTE — Telephone Encounter (Signed)
Attempted to call pt but pt did not answer. Will try again.

## 2017-02-07 NOTE — Progress Notes (Signed)
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 Please refer to report for other findings      11/10/2015 Procedure    she underwent CT guided biopsy      11/10/2015 Pathology Results    Accession: [id number] right lung CT-guided biopsy suspicious for lymphoma      12/02/2015 - 12/23/2015 Chemotherapy    She received 4 doses of weekly rituximab.      02/10/2016 PET scan    There is no evidence for residual or recurrent hypermetabolic adenopathy      8/67/6195 Imaging    The masslike area of concern in the medial right lung base seen on the previous study is unchanged in size since March 2017. A biopsy was previously recommended after the CT scan from October 26, 2015. Please see that report for further recommendations as the finding was better characterized at that time. 2. No pulmonary emboli. 3. Multiple thin walled cysts in the mid and lower lungs is stable. 4. 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      02/06/2017 PET scan    No evidence of active lymphomatous involvement. Stable 1.5 cm mixed cystic/ solid nodule in the right lower lobe, non FDG avid. Continued attention on follow-up is suggested.       INTERVAL HISTORY: Please see below for problem oriented charting. She returns for further  follow-up She complains of chronic back pain especially towards the left side She denies recent cough, lymphadenopathy and anorexia Denies recent infection  REVIEW OF SYSTEMS:   Constitutional: Denies fevers, chills or abnormal weight loss Eyes: Denies blurriness of vision Ears, nose, mouth, throat, and face: Denies mucositis or sore throat Respiratory: Denies cough, dyspnea or wheezes Cardiovascular: Denies palpitation, chest discomfort or lower extremity swelling Gastrointestinal:  Denies nausea, heartburn or change in bowel habits Skin: Denies abnormal skin rashes Lymphatics: Denies new lymphadenopathy or easy bruising Neurological:Denies numbness, tingling or new weaknesses Behavioral/Psych: Mood is stable, no new changes  All other systems were reviewed with the patient and are negative.  I have reviewed the past medical history, past surgical history, social history and family history with the patient and they are unchanged from previous note.  ALLERGIES:  is allergic to losartan; amoxicillin; beta adrenergic blockers; and clindamycin/lincomycin.  MEDICATIONS:  Current Outpatient Prescriptions  Medication Sig Dispense Refill  . Calcium Carbonate-Vitamin D (CALCIUM + D) 600-200 MG-UNIT TABS Take 2 tablets by mouth daily.      . hydroxypropyl methylcellulose / hypromellose (ISOPTO TEARS / GONIOVISC) 2.5 % ophthalmic solution Place 1 drop into both eyes 3 (three) times daily as needed for dry eyes.    Marland Kitchen POTASSIUM CHLORIDE PO Take 1 tablet by mouth 3 (three) times daily. OTC    . simvastatin (ZOCOR) 10 MG tablet Take 1 tablet (10 mg total) by  mouth at bedtime. 90 tablet 1  . Specialty Vitamins Products (MAGNESIUM, AMINO ACID CHELATE,) 133 MG tablet Take 1 tablet by mouth daily.    . vitamin B-12 (CYANOCOBALAMIN) 1000 MCG tablet Take 1,000 mcg by mouth daily.     No current facility-administered medications for this visit.     PHYSICAL EXAMINATION: ECOG PERFORMANCE STATUS: 1 -  Symptomatic but completely ambulatory  Vitals:   02/07/17 0908  BP: (!) 173/57  Pulse: 87  Resp: 18  Temp: 98.6 F (37 C)   Filed Weights   02/07/17 0908  Weight: 218 lb 12.8 oz (99.2 kg)    GENERAL:alert, no distress and comfortable SKIN: skin color, texture, turgor are normal, no rashes or significant lesions EYES: normal, Conjunctiva are pink and non-injected, sclera clear OROPHARYNX:no exudate, no erythema and lips, buccal mucosa, and tongue normal  NECK: supple, thyroid normal size, non-tender, without nodularity LYMPH:  no palpable lymphadenopathy in the cervical, axillary or inguinal LUNGS: clear to auscultation and percussion with normal breathing effort HEART: regular rate & rhythm and no murmurs and no lower extremity edema ABDOMEN:abdomen soft, non-tender and normal bowel sounds Musculoskeletal:no cyanosis of digits and no clubbing  NEURO: alert & oriented x 3 with fluent speech, no focal motor/sensory deficits  LABORATORY DATA:  I have reviewed the data as listed    Component Value Date/Time   NA 143 02/06/2017 1302   K 4.8 02/06/2017 1302   CL 104 03/09/2016 1813   CO2 26 02/06/2017 1302   GLUCOSE 100 02/06/2017 1302   BUN 14.2 02/06/2017 1302   CREATININE 0.8 02/06/2017 1302   CALCIUM 9.7 02/06/2017 1302   PROT 7.8 02/06/2017 1302   ALBUMIN 3.9 02/06/2017 1302   AST 20 02/06/2017 1302   ALT 25 02/06/2017 1302   ALKPHOS 105 02/06/2017 1302   BILITOT 0.41 02/06/2017 1302   GFRNONAA >60 03/09/2016 1813   GFRAA >60 03/09/2016 1813    No results found for: SPEP, UPEP  Lab Results  Component Value Date   WBC 6.8 02/06/2017   NEUTROABS 4.9 02/06/2017   HGB 12.7 02/06/2017   HCT 37.9 02/06/2017   MCV 87.9 02/06/2017   PLT 220 02/06/2017      Chemistry      Component Value Date/Time   NA 143 02/06/2017 1302   K 4.8 02/06/2017 1302   CL 104 03/09/2016 1813   CO2 26 02/06/2017 1302   BUN 14.2 02/06/2017 1302   CREATININE 0.8 02/06/2017 1302       Component Value Date/Time   CALCIUM 9.7 02/06/2017 1302   ALKPHOS 105 02/06/2017 1302   AST 20 02/06/2017 1302   ALT 25 02/06/2017 1302   BILITOT 0.41 02/06/2017 1302       RADIOGRAPHIC STUDIES: I have personally reviewed the radiological images as listed and agreed with the findings in the report. Nm Pet Image Restag (ps) Skull Base To Thigh  Result Date: 02/06/2017 CLINICAL DATA:  Subsequent treatment strategy for non-Hodgkin's lymphoma. EXAM: NUCLEAR MEDICINE PET SKULL BASE TO THIGH TECHNIQUE: 12.5 mCi F-18 FDG was injected intravenously. Full-ring PET imaging was performed from the skull base to thigh after the radiotracer. CT data was obtained and used for attenuation correction and anatomic localization. FASTING BLOOD GLUCOSE:  Value: 102 mg/dl COMPARISON:  PET-CT dated 02/10/2016 FINDINGS: NECK No hypermetabolic lymph nodes in the neck. Stable mild nodularity in the bilateral parotid glands. CHEST No hypermetabolic mediastinal or hilar nodes. 1.5 cm mixed cystic/ solid nodule in the right lower lobe (  series 7/ image 48), non FDG avid. Scattered thin-walled lung cysts bilaterally. Heart is normal in size. No pericardial effusion. Atherosclerotic calcifications of the aortic arch. ABDOMEN/PELVIS No abnormal hypermetabolic activity within the liver, pancreas, adrenal glands, or spleen. No hypermetabolic lymph nodes in the abdomen or pelvis. Mild geographic hepatic steatosis. Atherosclerotic calcifications of the aortic arch and branch vessels. Small duodenal diverticulum. Sigmoid diverticulosis, without evidence of diverticulitis. Status post hysterectomy. SKELETON No focal hypermetabolic activity to suggest skeletal metastasis. IMPRESSION: No evidence of active lymphomatous involvement. Stable 1.5 cm mixed cystic/ solid nodule in the right lower lobe, non FDG avid. Continued attention on follow-up is suggested. Electronically Signed   By: Julian Hy M.D.   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 Her most recent colonoscopy were within normal limits. She does not require further adjuvant treatment. 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 I will see her back in 12 months for repeat history, physical examination and blood work and repeat imaging study   Lung infiltrate on CT According to radiologist interpretation, the pulmonary infiltrates seen is compatible with cystic lung changes related to Sjogren's. PET CT scan show no new changes Observe only  Left flank pain The patient ruminates about her chronic back pain I have reviewed the imaging study with the patient She does not have evidence of cancer but rather mild scoliosis and degenerative joint changes, compatible with chronic degenerative arthritis I recommend physical therapy evaluation for strengthening exercises   Orders Placed This Encounter  Procedures  . NM PET Image Restag (PS) Skull Base To Thigh    Standing Status:   Future    Standing Expiration Date:   03/14/2018    Order Specific Question:   Reason for exam:    Answer:   staging lymphoma    Order Specific Question:   Preferred imaging location?    Answer:   [facility]  . Ambulatory Referral to Physical Therapy    Referral Priority:   Routine    Referral Type:   Physical Medicine    Referral Reason:   Specialty Services Required    Requested Specialty:   Physical Therapy    Number of Visits Requested:   1   All questions were answered. The patient knows to call the clinic with any problems, questions or concerns. No barriers to learning was detected. I spent 15 minutes counseling the patient face to face. The total time spent in the appointment was 20 minutes and  more than 50% was on counseling and review of test results     Heath Lark, MD 02/07/2017 4:43 PM

## 2017-02-07 NOTE — Telephone Encounter (Signed)
Spoke with pt and let her know she could take the alka seltzer.

## 2017-02-07 NOTE — Assessment & Plan Note (Signed)
The patient has complete response to treatment both in the lung area and in the parotid region. I will see her back in 12 months for repeat history, physical examination and blood work and repeat imaging study

## 2017-02-07 NOTE — Assessment & Plan Note (Signed)
The patient ruminates about her chronic back pain I have reviewed the imaging study with the patient She does not have evidence of cancer but rather mild scoliosis and degenerative joint changes, compatible with chronic degenerative arthritis I recommend physical therapy evaluation for strengthening exercises

## 2017-02-07 NOTE — Telephone Encounter (Signed)
Scheduled appt per 6/21 los.

## 2017-02-07 NOTE — Assessment & Plan Note (Signed)
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 PET CT scan show no new changes Observe only

## 2017-02-13 ENCOUNTER — Ambulatory Visit (AMBULATORY_SURGERY_CENTER): Payer: PPO | Admitting: Internal Medicine

## 2017-02-13 ENCOUNTER — Encounter: Payer: Self-pay | Admitting: Internal Medicine

## 2017-02-13 VITALS — BP 163/78 | HR 76 | Temp 97.7°F | Resp 15 | Ht 69.0 in | Wt 217.0 lb

## 2017-02-13 DIAGNOSIS — D127 Benign neoplasm of rectosigmoid junction: Secondary | ICD-10-CM | POA: Diagnosis not present

## 2017-02-13 DIAGNOSIS — Z1212 Encounter for screening for malignant neoplasm of rectum: Secondary | ICD-10-CM | POA: Diagnosis not present

## 2017-02-13 DIAGNOSIS — Z800 Family history of malignant neoplasm of digestive organs: Secondary | ICD-10-CM

## 2017-02-13 DIAGNOSIS — Z1211 Encounter for screening for malignant neoplasm of colon: Secondary | ICD-10-CM

## 2017-02-13 DIAGNOSIS — D120 Benign neoplasm of cecum: Secondary | ICD-10-CM

## 2017-02-13 DIAGNOSIS — D123 Benign neoplasm of transverse colon: Secondary | ICD-10-CM | POA: Diagnosis not present

## 2017-02-13 DIAGNOSIS — K635 Polyp of colon: Secondary | ICD-10-CM

## 2017-02-13 DIAGNOSIS — D124 Benign neoplasm of descending colon: Secondary | ICD-10-CM

## 2017-02-13 MED ORDER — SODIUM CHLORIDE 0.9 % IV SOLN
500.0000 mL | INTRAVENOUS | Status: DC
Start: 2017-02-13 — End: 2020-03-04

## 2017-02-13 NOTE — Progress Notes (Signed)
Called to room to assist during endoscopic procedure.  Patient ID and intended procedure confirmed with present staff. Received instructions for my participation in the procedure from the performing physician.  

## 2017-02-13 NOTE — Progress Notes (Signed)
A and O x3. Report to RN. Tolerated MAC anesthesia well.

## 2017-02-13 NOTE — Patient Instructions (Signed)
YOU HAD AN ENDOSCOPIC PROCEDURE TODAY AT THE [facility]:   Refer to the procedure report that was given to you for any specific questions about what was found during the examination.  If the procedure report does not answer your questions, please call your gastroenterologist to clarify.  If you requested that your care partner not be given the details of your procedure findings, then the procedure report has been included in a sealed envelope for you to review at your convenience later.  YOU SHOULD EXPECT: Some feelings of bloating in the abdomen. Passage of more gas than usual.  Walking can help get rid of the air that was put into your GI tract during the procedure and reduce the bloating. If you had a lower endoscopy (such as a colonoscopy or flexible sigmoidoscopy) you may notice spotting of blood in your stool or on the toilet paper. If you underwent a bowel prep for your procedure, you may not have a normal bowel movement for a few days.  Please Note:  You might notice some irritation and congestion in your nose or some drainage.  This is from the oxygen used during your procedure.  There is no need for concern and it should clear up in a day or so.  SYMPTOMS TO REPORT IMMEDIATELY:   Following lower endoscopy (colonoscopy or flexible sigmoidoscopy):  Excessive amounts of blood in the stool  Significant tenderness or worsening of abdominal pains  Swelling of the abdomen that is new, acute  Fever of 100F or higher  For urgent or emergent issues, a gastroenterologist can be reached at any hour by calling [phone].   DIET:  We do recommend a small meal at first, but then you may proceed to your regular diet.  Drink plenty of fluids but you should avoid alcoholic beverages for 24 hours.  MEDICATIONS: Continue present medications.  Please see handouts given to you by your recovery nurse.  ACTIVITY:  You should plan to take it easy for the rest of today and you should NOT  DRIVE or use heavy machinery until tomorrow (because of the sedation medicines used during the test).    FOLLOW UP: Our staff will call the number listed on your records the next business day following your procedure to check on you and address any questions or concerns that you may have regarding the information given to you following your procedure. If we do not reach you, we will leave a message.  However, if you are feeling well and you are not experiencing any problems, there is no need to return our call.  We will assume that you have returned to your regular daily activities without incident.  If any biopsies were taken you will be contacted by phone or by letter within the next 1-3 weeks.  Please call us at [phone] if you have not heard about the biopsies in 3 weeks.   Thank you for allowing us to provide for your healthcare needs today.   SIGNATURES/CONFIDENTIALITY: You and/or your care partner have signed paperwork which will be entered into your electronic medical record.  These signatures attest to the fact that that the information above on your After Visit Summary has been reviewed and is understood.  Full responsibility of the confidentiality of this discharge information lies with you and/or your care-partner. 

## 2017-02-13 NOTE — Op Note (Signed)
[facility] Patient Name: [name] Procedure Date: 02/13/2017 10:15 AM MRN: [id number] Endoscopist: Jerene Bears , MD Age: 76 Referring MD:  Date of Birth: 02-Dec-1940 Gender: Female Account #: [id number] Procedure:                Colonoscopy Indications:              High risk colon cancer surveillance: Personal                            history of mucinous neoplasm appendix, Family                            history of colon cancer in a first-degree relative,                            Last colonoscopy 5 years ago Medicines:                Monitored Anesthesia Care Procedure:                Pre-Anesthesia Assessment:                           - Prior to the procedure, a History and Physical                            was performed, and patient medications and                            allergies were reviewed. The patient's tolerance of                            previous anesthesia was also reviewed. The risks                            and benefits of the procedure and the sedation                            options and risks were discussed with the patient.                            All questions were answered, and informed consent                            was obtained. Prior Anticoagulants: The patient has                            taken no previous anticoagulant or antiplatelet                            agents. ASA Grade Assessment: III - A patient with                            severe systemic disease. After reviewing the risks  and benefits, the patient was deemed in                            satisfactory condition to undergo the procedure.                           After obtaining informed consent, the colonoscope                            was passed under direct vision. Throughout the                            procedure, the patient's blood pressure, pulse, and                            oxygen saturations were monitored  continuously. The                            Colonoscope was introduced through the anus and                            advanced to the the cecum, identified by                            appendiceal orifice and ileocecal valve. The                            colonoscopy was performed without difficulty. The                            patient tolerated the procedure well. The quality                            of the bowel preparation was good. The ileocecal                            valve, appendiceal orifice, and rectum were                            photographed. Scope In: 10:23:18 AM Scope Out: 10:45:27 AM Scope Withdrawal Time: 0 hours 14 minutes 50 seconds  Total Procedure Duration: 0 hours 22 minutes 9 seconds  Findings:                 The digital rectal exam was normal.                           A 8 mm polyp was found in the cecum. The polyp was                            sessile. The polyp was removed with a cold snare.                            Resection and retrieval were complete.  Two sessile polyps were found in the transverse                            colon. The polyps were 3 to 4 mm in size. These                            polyps were removed with a cold biopsy forceps.                            Resection and retrieval were complete.                           A 5 mm polyp was found in the descending colon. The                            polyp was sessile. The polyp was removed with a                            cold snare. Resection and retrieval were complete.                           Two sessile polyps were found in the recto-sigmoid                            colon. The polyps were 3 to 5 mm in size. These                            polyps were removed with a cold snare. Resection                            and retrieval were complete.                           Multiple small-mouthed diverticula were found in                             the sigmoid colon.                           Internal hemorrhoids were found during                            retroflexion. The hemorrhoids were small. Complications:            No immediate complications. Estimated Blood Loss:     Estimated blood loss was minimal. Impression:               - One 8 mm polyp in the cecum, removed with a cold                            snare. Resected and retrieved.                           - Two 3 to 4  mm polyps in the transverse colon,                            removed with a cold biopsy forceps. Resected and                            retrieved.                           - One 5 mm polyp in the descending colon, removed                            with a cold snare. Resected and retrieved.                           - Two 3 to 5 mm polyps at the recto-sigmoid colon,                            removed with a cold snare. Resected and retrieved.                           - Mild diverticulosis in the sigmoid colon.                           - Internal hemorrhoids. Recommendation:           - Patient has a contact number available for                            emergencies. The signs and symptoms of potential                            delayed complications were discussed with the                            patient. Return to normal activities tomorrow.                            Written discharge instructions were provided to the                            patient.                           - Resume previous diet.                           - Continue present medications.                           - Await pathology results.                           - Repeat colonoscopy is recommended. The  colonoscopy date will be determined after pathology                            results from today's exam become available for                            review. Jerene Bears, MD 02/13/2017 10:52:07 AM This report has been signed  electronically.

## 2017-02-13 NOTE — Progress Notes (Signed)
Pt's states no medical or surgical changes since previsit or office visit. 

## 2017-02-13 NOTE — Progress Notes (Signed)
Jillian Pollack CRNA in to talk to pt. About sipping on water pt. Stated she has sjogren,s syndrome and people need to be educated on it and [facility] told pt."it as okay for pt. To sip and spit."

## 2017-02-14 ENCOUNTER — Telehealth: Payer: Self-pay

## 2017-02-14 NOTE — Telephone Encounter (Signed)
  Follow up Call-  Call back number 02/13/2017  Post procedure Call Back phone  # [phone]  Permission to leave phone message Yes  Some recent data might be hidden     Patient questions:  Do you have a fever, pain , or abdominal swelling? No. Pain Score  0 *  Have you tolerated food without any problems? Yes.    Have you been able to return to your normal activities? Yes.    Do you have any questions about your discharge instructions: Diet   No. Medications  No. Follow up visit  No.  Do you have questions or concerns about your Care? No.  Actions: * If pain score is 4 or above: No action needed, pain <4.  No problems reported per pt. maw

## 2017-02-19 ENCOUNTER — Ambulatory Visit: Payer: PPO | Attending: Hematology and Oncology | Admitting: Physical Therapy

## 2017-02-19 ENCOUNTER — Encounter: Payer: Self-pay | Admitting: Physical Therapy

## 2017-02-19 DIAGNOSIS — R293 Abnormal posture: Secondary | ICD-10-CM | POA: Insufficient documentation

## 2017-02-19 DIAGNOSIS — M545 Low back pain: Secondary | ICD-10-CM | POA: Diagnosis not present

## 2017-02-19 DIAGNOSIS — M6281 Muscle weakness (generalized): Secondary | ICD-10-CM | POA: Diagnosis not present

## 2017-02-19 DIAGNOSIS — G8929 Other chronic pain: Secondary | ICD-10-CM | POA: Insufficient documentation

## 2017-02-19 NOTE — Therapy (Signed)
[place], [place], [address] Phone: [phone]   Fax:  [phone]  Physical Therapy Evaluation  Patient Details  Name: [name] MRN: [id number] Date of Birth: 17-Jan-1941 Referring Provider: Heath Lark  Encounter Date: 02/19/2017      PT End of Session - 02/19/17 1156    Visit Number 1   Number of Visits 9   Date for PT Re-Evaluation 03/19/17   PT Start Time 0847   PT Stop Time 0930   PT Time Calculation (min) 43 min   Activity Tolerance Patient tolerated treatment well   Behavior During Therapy [facility] for tasks assessed/performed      Past Medical History:  Diagnosis Date  . Anxiety   . Arthritis    right thumb [facility] arthritis  . Asthmatic bronchitis 2017  . Cancer ([place])    lymphoma  . Cataract    bilateral small  . 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 Complication of anesthesia    has dry mouth anyway from shograns-sore throats post op  . Depression   . Diverticulosis   . Fibromyalgia   . GERD (gastroesophageal reflux disease)   . Hyperlipidemia   . Hypertension   . Lung infiltrate on CT 09/14/2015  . Meningioma ([place]) 11/2009   right post parietal 52mm - stable 11/2009 MRI  . Nephrolithiasis   . Nephrolithiasis   . Neuromuscular disorder (HCC)    fibromyalgia  . 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 Sjogren's disease [facility])     Past Surgical History:  Procedure Laterality Date  . ABDOMINAL HYSTERECTOMY    . APPENDECTOMY  2008  . ARTERY BIOPSY  02/03/2013   Procedure: MINOR BIOPSY TEMPORAL ARTERY;  Surgeon: Ascencion Dike, MD;  Location: [place];  Service: ENT;;  . COLONOSCOPY    . [place]  . MASS EXCISION  2008   abd-benign  . PAROTIDECTOMY Right 08/01/2015   Procedure: RIGHT PAROTIDECTOMY;  Surgeon: [name], MD;  Location: [place];  Service: ENT;  Laterality: Right;  .  TONSILLECTOMY    . VAGINAL HYSTERECTOMY  1989    There were no vitals filed for this visit.       Subjective Assessment - 02/19/17 0942    Subjective I don't even see why I am here. This is not that bad. It is not getting worse. It is not intense pain. I am just here as a courtesy to my doctor. I don't think you can do anything for me. I don't know why I am here. Do you think I should try?    Pertinent History difficult to obtain- history in chart reports stage I colon cancer- pt states she does not have this, that it was a benign mass, hx of Sjogrens,non Hodgkins lymphoma, and skin cancer, depression   Patient Stated Goals to find out what is going on with my back   Currently in Pain? Yes   Pain Score --  pt states it is not pain but is soreness   Pain Location Back   Pain Orientation Left   Pain Descriptors / Indicators Sore   Pain Type Chronic pain   Pain Onset More than a month ago   Pain Frequency Intermittent   Aggravating Factors  nothing   Pain Relieving Factors nothing   Effect of Pain on Daily Activities does not affect ADLs  [facility] PT Assessment - 02/19/17 0001      Assessment   Medical Diagnosis appendiceal mucinous carcinoma, scoliosis, non Hodgkins lymphoma  pt completed infusion   Referring Provider Ni Gorsuch   Onset Date/Surgical Date 08/21/15   Hand Dominance Right   Prior Therapy none     Precautions   Precautions None     Restrictions   Weight Bearing Restrictions No     Balance Screen   Has the patient fallen in the past 6 months No   Has the patient had a decrease in activity level because of a fear of falling?  No   Is the patient reluctant to leave their home because of a fear of falling?  No     Home Environment   Living Environment Private residence   Living Arrangements Alone   Available Help at Discharge Friend(s);Family   Type of [place] Access Level entry   Home Layout One level   [place] None      Prior Function   Level of Independence Independent   Vocation Volunteer work;Retired  Make a Wish   Leisure pt reports she does not exercise- she states she used to walk- she states she is walking 15 min a day     Cognition   Overall Cognitive Status Within Functional Limits for tasks assessed     Observation/Other Assessments   Other Surveys  --  Oswestry Low back pain scale score: 3     Posture/Postural Control   Posture/Postural Control Postural limitations   Postural Limitations Increased lumbar lordosis;Increased thoracic kyphosis;Anterior pelvic tilt     ROM / Strength   AROM / PROM / Strength AROM;Strength     AROM   Overall AROM  Within functional limits for tasks performed   Overall AROM Comments slightly decreased left lateral lumbar flexion, no difference in rotation, no pain with flexion or extension     Strength   Overall Strength Within functional limits for tasks performed  for UEs            Objective measurements completed on examination: See above findings.                  PT Education - 02/19/17 1155    Education provided Yes   Education Details importance and benefits of participating in therapy, how scoliosis and posture can lead to back pain, course of therapy and what to expect   Person(s) Educated Patient   Methods Explanation   Comprehension Verbalized understanding                [facility] Goals - 02/19/17 1207      CC Long Term Goal  #1   Title Pt will report a 65% improvement in left back pain/soreness to allow improved comfort.    Time 4   Period Weeks   Status New     CC Long Term Goal  #2   Title Pt will be independent in a home exercise program for core strengthening and back stretching to reduce pain.   Time 4   Period Weeks   Status New     CC Long Term Goal  #3   Title Pt will verbalize understanding role of core muscles and core muscle strength in posture and back pain.   Time 4   Period  Weeks   Status New             Plan - 02/19/17 1201  Clinical Impression Statement Pt referred to PT for left sided low back pain. Pt reports she does not feel like she needs therapy. She states she already knows how to exercise and does not see any benefits to therapy. Her past medical history obtained from her doctor include DJD and mild scoliosis. Pt has increased kyphosis and lumbar lordosis. She has mild decreased left lateral lumbar flexion compared to right. Educated pt about benefits of core strengthening and posture re education to help decrease back pain. Pt agreed to therapy and encouragement and education.    History and Personal Factors relevant to plan of care: Sjogrens, fibromyalgia   Clinical Presentation Stable   Clinical Decision Making Low   Rehab Potential Good   Clinical Impairments Affecting Rehab Potential pt reports she has depression and is sometimes not motivated   PT Frequency 2x / week   PT Duration 4 weeks   PT Treatment/Interventions ADLs/Self Care Home Management;Therapeutic exercise;Therapeutic activities;Patient/family education;Manual techniques;Passive range of motion   PT Next Visit Plan begin core strengthening exercises, posture exercises, soft tissue to area of soreness in left lower back   Consulted and Agree with Plan of Care Patient      Patient will benefit from skilled therapeutic intervention in order to improve the following deficits and impairments:  Impaired flexibility, Decreased range of motion, Decreased strength, Postural dysfunction, Pain  Visit Diagnosis: Abnormal posture - Plan: PT plan of care cert/re-cert  Muscle weakness (generalized) - Plan: PT plan of care cert/re-cert  Chronic left-sided low back pain without sciatica - Plan: PT plan of care cert/re-cert      G-Codes - 44/01/02 12/14/1210    Functional Assessment Tool Used (Outpatient Only) Oswestry Low Back Pain Scale   Functional Limitation Changing and maintaining body  position   Changing and Maintaining Body Position Current Status (V2536) At least 1 percent but less than 20 percent impaired, limited or restricted   Changing and Maintaining Body Position Goal Status (U4403) 0 percent impaired, limited or restricted       Problem List Patient Active Problem List   Diagnosis Date Noted  . Skin rash 02/14/2016  . Left flank pain 02/14/2016  . Acute bronchitis 01/12/2016  . Hypersensitivity reaction 12/02/2015  . Essential hypertension 09/15/2015  . Lung infiltrate on CT 09/14/2015  . Lung cyst 09/13/2015  . History of colon cancer, stage I 08/31/2015  . 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 H/O superficial parotidectomy 08/01/2015  . Other abnormal glucose 01/23/2013  . Left-sided headache 01/15/2013  . Chronic diarrhea 05/15/2012  . Bruising 01/25/2012  . Palpitations 11/12/2011  . Atypical chest pain 11/12/2011  . Preventative health care 05/15/2011  . CHEST PAIN UNSPECIFIED 02/02/2010  . HYPERTENSION 12/26/2009  . GERD 12/26/2009  . CONTACT DERMATITIS 09/02/2007  . DE QUERVAIN'S TENOSYNOVITIS 09/02/2007  . MENINGIOMA 07/22/2007  . DIVERTICULOSIS, COLON 07/22/2007  . FIBROMYALGIA 07/22/2007  . HYPERLIPIDEMIA 07/21/2007  . [facility] SYNDROME 07/21/2007    Allyson Sabal [facility] 02/19/2017, 12:15 PM  [place], [place], [address] Phone: [phone]   Fax:  [phone]  Name: [name] MRN: [id number] Date of Birth: 1941-03-27  Manus Gunning, PT 02/19/17 12:15 PM

## 2017-02-21 ENCOUNTER — Ambulatory Visit: Payer: PPO | Admitting: Physical Therapy

## 2017-02-22 ENCOUNTER — Encounter: Payer: Self-pay | Admitting: Internal Medicine

## 2017-02-26 ENCOUNTER — Ambulatory Visit: Payer: PPO

## 2017-02-26 DIAGNOSIS — R293 Abnormal posture: Secondary | ICD-10-CM

## 2017-02-26 DIAGNOSIS — M6281 Muscle weakness (generalized): Secondary | ICD-10-CM

## 2017-02-26 DIAGNOSIS — G8929 Other chronic pain: Secondary | ICD-10-CM

## 2017-02-26 DIAGNOSIS — M545 Low back pain, unspecified: Secondary | ICD-10-CM

## 2017-02-26 NOTE — Therapy (Signed)
[place], [place], [address] Phone: [phone]   Fax:  [phone]  Physical Therapy Treatment  Patient Details  Name: [name] MRN: [id number] Date of Birth: Jan 26, 1941 Referring Provider: Heath Lark  Encounter Date: 02/26/2017      PT End of Session - 02/26/17 1110    Visit Number 2   Number of Visits 9   Date for PT Re-Evaluation 03/19/17   PT Start Time 0932   PT Stop Time 1021   PT Time Calculation (min) 49 min   Activity Tolerance Patient tolerated treatment well   Behavior During Therapy [facility] for tasks assessed/performed      Past Medical History:  Diagnosis Date  . Anxiety   . Arthritis    right thumb [facility] arthritis  . Asthmatic bronchitis 2017  . Cancer ([place])    lymphoma  . Cataract    bilateral small  . 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 Complication of anesthesia    has dry mouth anyway from shograns-sore throats post op  . Depression   . Diverticulosis   . Fibromyalgia   . GERD (gastroesophageal reflux disease)   . Hyperlipidemia   . Hypertension   . Lung infiltrate on CT 09/14/2015  . Meningioma ([place]) 11/2009   right post parietal 69mm - stable 11/2009 MRI  . Nephrolithiasis   . Nephrolithiasis   . Neuromuscular disorder (HCC)    fibromyalgia  . 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 Sjogren's disease [facility])     Past Surgical History:  Procedure Laterality Date  . ABDOMINAL HYSTERECTOMY    . APPENDECTOMY  2008  . ARTERY BIOPSY  02/03/2013   Procedure: MINOR BIOPSY TEMPORAL ARTERY;  Surgeon: Ascencion Dike, MD;  Location: [place];  Service: ENT;;  . COLONOSCOPY    . [place]  . MASS EXCISION  2008   abd-benign  . PAROTIDECTOMY Right 08/01/2015   Procedure: RIGHT PAROTIDECTOMY;  Surgeon: [name], MD;  Location: [place];  Service: ENT;  Laterality: Right;  .  TONSILLECTOMY    . VAGINAL HYSTERECTOMY  1989    There were no vitals filed for this visit.      Subjective Assessment - 02/26/17 0939    Subjective My Lt back pain is mild. I'm not sure how much this will help but I'm willing to try. I'm not having any pain right now.   Pertinent History difficult to obtain- history in chart reports stage I colon cancer- pt states she does not have this, that it was a benign mass, hx of Sjogrens,non Hodgkins lymphoma, and skin cancer, depression   Patient Stated Goals to find out what is going on with my back   Currently in Pain? No/denies                         [facility] Adult PT Treatment/Exercise - 02/26/17 0001      Self-Care   Self-Care Posture   Posture Began instruction of proper lifting techniques/posture with ADLs that pt reports she tries to do consistently but therapist was able to give her some specific tips to try (getting clothes out of bottom of washer using golfer lift...and how to do golfer lift, using a pillow at her back for support when sitting for extended periods of time) and answered her questions regarding this  and how this can be beneficial. Pt also had questions regarding scoliosis and what this was.      Lumbar Exercises: Stretches   Passive Hamstring Stretch 3 reps;20 seconds   Passive Hamstring Stretch Limitations Used towel for ease, in supine   Single Knee to Chest Stretch 2 reps;10 seconds   Single Knee to Chest Stretch Limitations Pt did not feel stretch with this   Lower Trunk Rotation 1 rep;20 seconds     Lumbar Exercises: Supine   Ab Set 10 reps;3 seconds  Pelvic tilt   Clam 20 reps;2 seconds  2 sets of 10 with pelvic tilt throughout   Heel Slides 20 reps  With pelvic tilt   Bent Knee Raise 20 reps  With pelvic tilt, alt knee raise     Manual Therapy   Manual Therapy Soft tissue mobilization   Soft tissue mobilization To Lt low back in Rt S/L                PT Education - 02/26/17  1004    Education provided Yes   Education Details Supine lumbar stabs    Person(s) Educated Patient   Methods Explanation;Demonstration;Handout;Verbal cues   Comprehension Verbalized understanding;Returned demonstration;Need further instruction                [facility] Goals - 02/19/17 1207      CC Long Term Goal  #1   Title Pt will report a 65% improvement in left back pain/soreness to allow improved comfort.    Time 4   Period Weeks   Status New     CC Long Term Goal  #2   Title Pt will be independent in a home exercise program for core strengthening and back stretching to reduce pain.   Time 4   Period Weeks   Status New     CC Long Term Goal  #3   Title Pt will verbalize understanding role of core muscles and core muscle strength in posture and back pain.   Time 4   Period Weeks   Status New            Plan - 02/26/17 1111    Clinical Impression Statement Pt was mostly receptive to education about posture today and instruction of lumabr stab exercise so issued this to HEP. She still seems a little resistant to how therapy will benefit her though she reported feeling benefit of exercises by end of session. Pt asked good questions regarding scoliosis today and was receptive to learning about this as well.   Rehab Potential Good   Clinical Impairments Affecting Rehab Potential pt reports she has depression and is sometimes not motivated   PT Frequency 2x / week   PT Duration 4 weeks   PT Treatment/Interventions ADLs/Self Care Home Management;Therapeutic exercise;Therapeutic activities;Patient/family education;Manual techniques;Passive range of motion   PT Next Visit Plan Cont core strengthening exercises, posture exercises, soft tissue to area of soreness in left lower back; assess how pt felt after today. Try adding supine scapular series but may need to use barbells as pt reports holding theraband is hard with arthritic hands.    Consulted and Agree with  Plan of Care Patient      Patient will benefit from skilled therapeutic intervention in order to improve the following deficits and impairments:  Impaired flexibility, Decreased range of motion, Decreased strength, Postural dysfunction, Pain  Visit Diagnosis: Abnormal posture  Muscle weakness (generalized)  Chronic left-sided low back pain without sciatica  Problem List Patient Active Problem List   Diagnosis Date Noted  . Skin rash 02/14/2016  . Left flank pain 02/14/2016  . Acute bronchitis 01/12/2016  . Hypersensitivity reaction 12/02/2015  . Essential hypertension 09/15/2015  . Lung infiltrate on CT 09/14/2015  . Lung cyst 09/13/2015  . History of colon cancer, stage I 08/31/2015  . 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 H/O superficial parotidectomy 08/01/2015  . Other abnormal glucose 01/23/2013  . Left-sided headache 01/15/2013  . Chronic diarrhea 05/15/2012  . Bruising 01/25/2012  . Palpitations 11/12/2011  . Atypical chest pain 11/12/2011  . Preventative health care 05/15/2011  . CHEST PAIN UNSPECIFIED 02/02/2010  . HYPERTENSION 12/26/2009  . GERD 12/26/2009  . CONTACT DERMATITIS 09/02/2007  . DE QUERVAIN'S TENOSYNOVITIS 09/02/2007  . MENINGIOMA 07/22/2007  . DIVERTICULOSIS, COLON 07/22/2007  . FIBROMYALGIA 07/22/2007  . HYPERLIPIDEMIA 07/21/2007  . [facility] SYNDROME 07/21/2007    Otelia Limes, PTA 02/26/2017, 12:12 PM  [place], [place], [address] Phone: [phone]   Fax:  [phone]  Name: ADALEI NOVELL MRN: [id number] Date of Birth: 1941/06/24

## 2017-02-26 NOTE — Patient Instructions (Addendum)
(  Home) Flexion: Pelvic Tilt    Lie with neck supported, knees bent, feet flat. Tighten and suck stomach in, pushing back down against surface. Do not push down with legs. Repeat __10-20__ times per set, holding 5 seconds. Do _1-2___ sets per session.   Then with holding pelvic tilt:  1. Perform clam (opening and closing knees) 20 times 2. Alternate marching 20 times each leg 3. Heel slide 10-20 times each leg       Remember to hold tilt throughout!   Hamstring Stretch    Inhale and straighten spine. Exhale and lean forward toward extended leg. Hold position for _20__ seconds. Inhale and come back to center. Repeat with other leg extended. Repeat __3_ times, alternating legs. Do _2-3__ times per day.  Piriformis Stretch, Sitting    Sit, one ankle on opposite knee, same-side hand on crossed knee. Push down on knee, keeping spine straight. Lean torso forward, with flat back, until tension is felt in hamstrings and gluteals of crossed-leg side. Hold _20__ seconds.  Repeat _3__ times per session. Do _2-3__ sessions per day.    Cancer Rehab [phone]

## 2017-02-28 ENCOUNTER — Ambulatory Visit: Payer: PPO

## 2017-02-28 ENCOUNTER — Telehealth: Payer: Self-pay | Admitting: Internal Medicine

## 2017-02-28 DIAGNOSIS — R293 Abnormal posture: Secondary | ICD-10-CM

## 2017-02-28 DIAGNOSIS — M6281 Muscle weakness (generalized): Secondary | ICD-10-CM

## 2017-02-28 DIAGNOSIS — M545 Low back pain, unspecified: Secondary | ICD-10-CM

## 2017-02-28 DIAGNOSIS — G8929 Other chronic pain: Secondary | ICD-10-CM

## 2017-02-28 NOTE — Therapy (Addendum)
[place], [place], [address] Phone: [phone]   Fax:  [phone]  Physical Therapy Treatment  Patient Details  Name: PETE MERTEN MRN: [id number] Date of Birth: 1941-04-05 Referring Provider: Heath Lark  Encounter Date: 02/28/2017      PT End of Session - 02/28/17 1101    Visit Number 3   Number of Visits 9   Date for PT Re-Evaluation 03/19/17   PT Start Time 0934   PT Stop Time 1025   PT Time Calculation (min) 51 min   Activity Tolerance Patient tolerated treatment well   Behavior During Therapy [facility] for tasks assessed/performed      Past Medical History:  Diagnosis Date  . Anxiety   . Arthritis    right thumb [facility] arthritis  . Asthmatic bronchitis 2017  . Cancer ([place])    lymphoma  . Cataract    bilateral small  . 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 Complication of anesthesia    has dry mouth anyway from shograns-sore throats post op  . Depression   . Diverticulosis   . Fibromyalgia   . GERD (gastroesophageal reflux disease)   . Hyperlipidemia   . Hypertension   . Lung infiltrate on CT 09/14/2015  . Meningioma ([place]) 11/2009   right post parietal 75m - stable 11/2009 MRI  . Nephrolithiasis   . Nephrolithiasis   . Neuromuscular disorder (HCC)    fibromyalgia  . 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 Sjogren's disease ([facility]     Past Surgical History:  Procedure Laterality Date  . ABDOMINAL HYSTERECTOMY    . APPENDECTOMY  2008  . ARTERY BIOPSY  02/03/2013   Procedure: MINOR BIOPSY TEMPORAL ARTERY;  Surgeon: SAscencion Dike MD;  [place] [facility] [place]  Service: ENT;;  . COLONOSCOPY    . [place] MASS EXCISION  2008   abd-benign  . PAROTIDECTOMY Right 08/01/2015   Procedure: RIGHT PAROTIDECTOMY;  Surgeon: SLeta Baptist MD;  [facility]  Service: ENT;  Laterality: Right;  .  TONSILLECTOMY    . VAGINAL HYSTERECTOMY  1989    There were no vitals filed for this visit.      Subjective Assessment - 02/28/17 0942    Subjective I felt fine after last visit, didn't hurt more. I tried the HEP last night and I want to review it. I'm going to make today my last day. [facility] I appreciate what you've taught me.    Pertinent History difficult to obtain- history in chart reports stage I colon cancer- pt states she does not have this, that it was a benign mass, hx of Sjogrens,non Hodgkins lymphoma, and skin cancer, depression   Patient Stated Goals to find out what is going on with my back   Currently in Pain? No/denies                         [facility] - 02/28/17 0001      Self-Care   Self-Care Posture   Posture Continued with posture and body mechanics instruction with ADLs to include demonstration of golfers lift today and answered pts questions about importance of correct body mechanics, especially with having scoliosis.     Shoulder Exercises: Supine   Horizontal ABduction Strengthening;Both;10 reps;Theraband   Theraband  Level (Shoulder Horizontal ABduction) Level 2 (Red)   External Rotation Strengthening;Both;10 reps;Theraband   Theraband Level (Shoulder External Rotation) Level 2 (Red)   Flexion Strengthening;Both;10 reps;Theraband  Narrow and Wide Grip, 10 times each   Theraband Level (Shoulder Flexion) Level 2 (Red)                PT Education - 02/28/17 0957    Education provided Yes   Education Details Supine scapular series with red theraband   Person(s) Educated Patient   Methods Explanation;Demonstration;Handout   Comprehension Verbalized understanding;Returned demonstration                [facility] Goals - 02/28/17 1017      CC Long Term Goal  #1   Title Pt will report a 65% improvement in left back pain/soreness to allow improved comfort.    Baseline Pt  reports this is too inconsistent to rate-02/28/17   Status Not Met     CC Long Term Goal  #2   Title Pt will be independent in a home exercise program for core strengthening and back stretching to reduce pain.   Status Achieved     CC Long Term Goal  #3   Title Pt will verbalize understanding role of core muscles and core muscle strength in posture and back pain.   Status Achieved            Plan - 02/28/17 1103    Clinical Impression Statement Pt came in reporting she wanted today to be here last session as she says she can do these exercises at the gym. Encouraged her to do so though as she hasn't been back in awhile. Also encouraged her to resume water aerobics and swimming as she reports she used to d this and enjoyed it greatly. Pt did well with review of HEP issued at last visit and did well with postural strenghtening exercises today. Further instructed her on importance of correct posture throughout day, especially with ADLs, also demonstrated golfers lift and she reported this all being good information for her to know. Pt reported pleased she came to be instructed about scoliosis and how these exercises can help her though reports sometimes the depression she suffers keeps her from exercising like she knows she should. Also then encouraged pt to become more active as this can also help with her depression some. Pt verbalized understanding this.    Rehab Potential Good   Clinical Impairments Affecting Rehab Potential pt reports she has depression and is sometimes not motivated   PT Frequency 2x / week   PT Duration 4 weeks   PT Treatment/Interventions ADLs/Self Care Home Management;Therapeutic exercise;Therapeutic activities;Patient/family education;Manual techniques;Passive range of motion   PT Next Visit Plan D/C this visit per pt request.    PT Home Exercise Plan Lumbar stabs, supine scapular series and posture/body mechanics instruction   Consulted and Agree with Plan of Care  Patient      Patient will benefit from skilled therapeutic intervention in order to improve the following deficits and impairments:  Impaired flexibility, Decreased range of motion, Decreased strength, Postural dysfunction, Pain  Visit Diagnosis: Abnormal posture  Muscle weakness (generalized)  Chronic left-sided low back pain without sciatica     Problem List Patient Active Problem List   Diagnosis Date Noted  . Skin rash 02/14/2016  . Left flank pain 02/14/2016  . Acute bronchitis 01/12/2016  . Hypersensitivity reaction 12/02/2015  . Essential hypertension 09/15/2015  . Lung infiltrate on CT  09/14/2015  . Lung cyst 09/13/2015  . History of colon cancer, stage I 08/31/2015  . 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 H/O superficial parotidectomy 08/01/2015  . Other abnormal glucose 01/23/2013  . Left-sided headache 01/15/2013  . Chronic diarrhea 05/15/2012  . Bruising 01/25/2012  . Palpitations 11/12/2011  . Atypical chest pain 11/12/2011  . Preventative health care 05/15/2011  . CHEST PAIN UNSPECIFIED 02/02/2010  . HYPERTENSION 12/26/2009  . GERD 12/26/2009  . CONTACT DERMATITIS 09/02/2007  . DE QUERVAIN'S TENOSYNOVITIS 09/02/2007  . MENINGIOMA 07/22/2007  . DIVERTICULOSIS, COLON 07/22/2007  . FIBROMYALGIA 07/22/2007  . HYPERLIPIDEMIA 07/21/2007  . [facility] SYNDROME 07/21/2007    Otelia Limes, PTA 02/28/2017, 11:25 AM  [place], [place], [address] Phone: [phone]   Fax:  [phone]  Name: GOLDIE TREGONING MRN: [id number] Date of Birth: 1940/08/26  PHYSICAL THERAPY DISCHARGE SUMMARY  Visits from Start of Care: 3  Current functional level related to goals / functional outcomes: Pt now has a home exercise program for core strengthening. Pt has also been educated on scoliosis and importance of core strengthening.   Remaining  deficits: Pt still has some discomfort and has not seen an improvement but states the pain does not bother her.  Education / Equipment: HEP for core strength Plan: Patient agrees to discharge.  Patient goals were partially met. Patient is being discharged due to the patient's request.  ?????    Allyson Sabal [place], [place] 02/28/17 12:10 PM

## 2017-02-28 NOTE — Patient Instructions (Signed)

## 2017-02-28 NOTE — Telephone Encounter (Signed)
Spoke with pt and she reports that when she urinated yesterday am she saw some BRB in her underwear. She has not seen any more at all. Discussed with her that it could have been due to irritation to her rectum/internal hemorrhoids from the prep. Pt instructed to observe for now and to let us know if she had any more issues. Pt verbalized understanding.

## 2017-03-05 ENCOUNTER — Encounter: Payer: PPO | Admitting: Physical Therapy

## 2017-03-06 ENCOUNTER — Encounter: Payer: PPO | Admitting: Physical Therapy

## 2017-03-14 ENCOUNTER — Encounter: Payer: PPO | Admitting: Physical Therapy

## 2017-03-19 DIAGNOSIS — R7303 Prediabetes: Secondary | ICD-10-CM | POA: Diagnosis not present

## 2017-03-19 DIAGNOSIS — C8511 Unspecified B-cell lymphoma, lymph nodes of head, face, and neck: Secondary | ICD-10-CM | POA: Diagnosis not present

## 2017-03-19 DIAGNOSIS — E785 Hyperlipidemia, unspecified: Secondary | ICD-10-CM | POA: Diagnosis not present

## 2017-03-19 DIAGNOSIS — Z0000 Encounter for general adult medical examination without abnormal findings: Secondary | ICD-10-CM | POA: Diagnosis not present

## 2017-03-19 DIAGNOSIS — M3500 Sicca syndrome, unspecified: Secondary | ICD-10-CM | POA: Diagnosis not present

## 2017-03-19 DIAGNOSIS — Z6831 Body mass index (BMI) 31.0-31.9, adult: Secondary | ICD-10-CM | POA: Diagnosis not present

## 2017-03-19 DIAGNOSIS — I10 Essential (primary) hypertension: Secondary | ICD-10-CM | POA: Diagnosis not present

## 2017-05-10 DIAGNOSIS — Z23 Encounter for immunization: Secondary | ICD-10-CM | POA: Diagnosis not present

## 2017-05-16 ENCOUNTER — Ambulatory Visit (INDEPENDENT_AMBULATORY_CARE_PROVIDER_SITE_OTHER): Payer: PPO | Admitting: Acute Care

## 2017-05-16 ENCOUNTER — Encounter: Payer: Self-pay | Admitting: Acute Care

## 2017-05-16 DIAGNOSIS — J209 Acute bronchitis, unspecified: Secondary | ICD-10-CM | POA: Diagnosis not present

## 2017-05-16 DIAGNOSIS — R911 Solitary pulmonary nodule: Secondary | ICD-10-CM | POA: Diagnosis not present

## 2017-05-16 DIAGNOSIS — M3500 Sicca syndrome, unspecified: Secondary | ICD-10-CM

## 2017-05-16 MED ORDER — PREDNISONE 10 MG PO TABS: ORAL_TABLET | ORAL | 0 refills | Status: DC

## 2017-05-16 NOTE — Assessment & Plan Note (Signed)
Consider PFTs, and 6 minute walk, now that chemotherapy has been completed

## 2017-05-16 NOTE — Assessment & Plan Note (Signed)
Mild flare Unaware of trigger, but suspects it may be fluid shot patient received 05/10/2017 Plan We will prescribe a Prednisone taper; 10 mg tablets: 3 tabs x 2 days, 2 tabs x 2 days 1 tab x 2 days then stop. Try over the counter Robitussin for cough as needed. Avoid mint and menthol. Try sugar free hard candy for throat soothing. Sips of water instead of throat soothing. Follow up with Dr. Ashok Cordia in October as is scheduled.  If you are better call 2 days in advance to cancel. Please contact office for sooner follow up if symptoms do not improve or worsen or seek emergency care

## 2017-05-16 NOTE — Assessment & Plan Note (Signed)
CT 03/08/2016 indicated medial right lung base area of concern was unchanged since March 2017. Plan Consider repeat CT chest annually Discuss with Dr. Ashok Cordia at October appointment

## 2017-05-16 NOTE — Progress Notes (Signed)
History of Present Illness [name] is a 76 y.o. female never smoker with asthmatic bronchitis. She is followed by  Dr. Ashok Cordia.   Synopsis: 10/18/2015-DrAshok Cordia Patient referred for evaluation of her right lower lobe opacity with hypermetabolic activity on PET/CT imaging as well as bilateral lung cysts. Patient with known history of Sjogren's syndrome not currently on treatment as well as early non-Hodgkin's lymphoma of lymph nodes of the head not currently on treatment. 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 She reports in the 1980s she was diagnosed with Sjogrens because of dry eyes and dry mouth. 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 This is most likely secondary to the patient's Sjogren's syndrome as this can cause cystic lung disease approximately 10% of patients. There is no other suggestion of interstitial lung disease or pulmonary fibrosis on  review of the CT imaging. It is possible there could be an overlap autoimmune disease with her cough . Dr. Ashok Cordia remains suspicious about the possibility of underlying rheumatoid arthritis.    05/16/2017 Acute OV: Pt. Presents for Acute OV. She got a flu shot 05/10/2017. She then noticed increase in her sputum and her cough. She states her sputum is white to clear.She feels this peaked 05/15/2017 She states her cough is a bit better today.She took tylenol with some relief.She states she has had no fever, but some wheezing. She denies shortness of breath, chest pain orthopnea or dyspnea. She is unaware of any other  Trigger, with exception of flu shot.Cough is not worse with any change in position.She is not on PPI. She denies chest pain, orthopnea, or hemoptysis.  Test Results:  CT chest 03/09/2016 The masslike area of concern in the medial right lung base seen on the  previous study is unchanged in size since March 2017. A biopsy was previously recommended after the CT scan from October 26, 2015. Please see that report for further recommendations as the finding was better characterized at that time. 2. No pulmonary emboli. 3. Multiple thin walled cysts in the mid and lower lungs is stable. 4. 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  CBC Latest Ref Rng & Units 02/06/2017 08/06/2016 03/09/2016  WBC 3.9 - 10.3 10e3/uL 6.8 6.6 8.2  Hemoglobin 11.6 - 15.9 g/dL 12.7 12.3 12.3  Hematocrit 34.8 - 46.6 % 37.9 37.5 37.6  Platelets 145 - 400 10e3/uL 220 234 233    BMP Latest Ref Rng & Units 02/06/2017 08/06/2016 03/09/2016  Glucose 70 - 140 mg/dl 100 122 106(H)  BUN 7.0 - 26.0 mg/dL 14.2 15.4 17  Creatinine 0.6 - 1.1 mg/dL 0.8 0.8 0.72  Sodium 136 - 145 mEq/L 143 140 139  Potassium 3.5 - 5.1 mEq/L 4.8 4.2 4.1  Chloride 101 - 111 mmol/L - - 104  CO2 22 - 29 mEq/L 26 25 27   Calcium 8.4 - 10.4 mg/dL 9.7 10.1 9.6    Past medical hx Past Medical History:  Diagnosis Date  . Anxiety   . Arthritis    right thumb [facility] arthritis  . Asthmatic bronchitis 2017  . Cancer ([place])    lymphoma  . Cataract    bilateral small  . 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 Complication of anesthesia    has dry mouth anyway from shograns-sore throats post op  .  Depression   . Diverticulosis   . Fibromyalgia   . GERD (gastroesophageal reflux disease)   . Hyperlipidemia   . Hypertension   . Lung infiltrate on CT 09/14/2015  . Meningioma ([place]) 11/2009   right post parietal 36mm - stable 11/2009 MRI  . Nephrolithiasis   . Nephrolithiasis   . Neuromuscular disorder (HCC)    fibromyalgia  . 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 Sjogren's disease [facility])      Social History  Substance Use Topics  . Smoking status: Never Smoker  . Smokeless tobacco: Never Used     Comment: Passive exposure through her mother.  . Alcohol  use 0.0 oz/week     Comment: Rarely    [name] reports that she has never smoked. She has never used smokeless tobacco. She reports that she drinks alcohol. She reports that she does not use drugs.  Tobacco Cessation: Never smoker  Past surgical hx, Family hx, Social hx all reviewed.  Current Outpatient Prescriptions on File Prior to Visit  Medication Sig  . Calcium Carbonate-Vitamin D (CALCIUM + D) 600-200 MG-UNIT TABS Take 2 tablets by mouth daily.    . hydroxypropyl methylcellulose / hypromellose (ISOPTO TEARS / GONIOVISC) 2.5 % ophthalmic solution Place 1 drop into both eyes 3 (three) times daily as needed for dry eyes.  Marland Kitchen POTASSIUM CHLORIDE PO Take 1 tablet by mouth 3 (three) times daily. OTC  . simvastatin (ZOCOR) 10 MG tablet Take 1 tablet (10 mg total) by mouth at bedtime.  Marland Kitchen Specialty Vitamins Products (MAGNESIUM, AMINO ACID CHELATE,) 133 MG tablet Take 1 tablet by mouth daily.  . vitamin B-12 (CYANOCOBALAMIN) 1000 MCG tablet Take 1,000 mcg by mouth daily.   Current Facility-Administered Medications on File Prior to Visit  Medication  . 0.9 %  sodium chloride infusion     Allergies  Allergen Reactions  . Losartan Other (See Comments)    Causes somnolence  . Amoxicillin Rash  . Beta Adrenergic Blockers Diarrhea  . Clindamycin/Lincomycin Diarrhea and Rash    Review Of Systems:  Constitutional:   No  weight loss, night sweats,  Fevers, chills, +fatigue, or  lassitude.  HEENT:   No headaches,  Difficulty swallowing,  Tooth/dental problems, or  Sore throat,                No sneezing, itching, ear ache, nasal congestion, +post nasal drip,   CV:  No chest pain,  Orthopnea, PND, swelling in lower extremities, anasarca, dizziness, palpitations, syncope.   GI  No heartburn, indigestion, abdominal pain, nausea, vomiting, diarrhea, change in bowel habits, loss of appetite, bloody stools.   Resp: No shortness of breath with exertion or at rest.  + excess mucus, + productive  cough,  No non-productive cough,  No coughing up of blood.  No change in color of mucus.  + wheezing.  No chest wall deformity  Skin: no rash or lesions.  GU: no dysuria, change in color of urine, no urgency or frequency.  No flank pain, no hematuria   MS:  No joint pain or swelling.  No decreased range of motion.  No back pain.  Psych:  No change in mood or affect. No depression or anxiety.  No memory loss.   Vital Signs BP (!) 152/76 (BP Location: Left Arm, Cuff Size: Normal)   Pulse 97   Ht 5\' 9"  (1.753 m)   Wt 219 lb (99.3 kg)   SpO2 95%   BMI 32.34 kg/m  Physical Exam:  General- No distress,  A&Ox3, pleasant ENT: No sinus tenderness, TM clear, pale nasal mucosa, no oral exudate,no post nasal drip, no LAN Cardiac: S1, S2, regular rate and rhythm, no murmur Chest: +wheeze/No rales/ dullness; no accessory muscle use, no nasal flaring, no sternal retractions Abd.: Soft Non-tender, nondistended bowel sounds positive Ext: No clubbing cyanosis, edema Neuro:  normal strength, cranial nerves intact appropriate Skin: No rashes, warm and dry Psych: normal mood and behavior   Assessment/Plan  Acute bronchitis Mild flare Unaware of trigger, but suspects it may be fluid shot patient received 05/10/2017 Plan We will prescribe a Prednisone taper; 10 mg tablets: 3 tabs x 2 days, 2 tabs x 2 days 1 tab x 2 days then stop. Try over the counter Robitussin for cough as needed. Avoid mint and menthol. Try sugar free hard candy for throat soothing. Sips of water instead of throat soothing. Follow up with Dr. Ashok Cordia in October as is scheduled.  If you are better call 2 days in advance to cancel. Please contact office for sooner follow up if symptoms do not improve or worsen or seek emergency care      [facility] SYNDROME Consider PFTs, and 6 minute walk, now that chemotherapy has been completed   Pulmonary nodule CT 03/08/2016 indicated medial right lung base area of concern was  unchanged since March 2017. Plan Consider repeat CT chest annually Discuss with Dr. Ashok Cordia at October appointment    Magdalen Spatz, NP 05/16/2017  9:52 PM

## 2017-05-16 NOTE — Patient Instructions (Addendum)
It is nice to meet you today. We will prescribe a Prednisone taper; 10 mg tablets: 3 tabs x 2 days, 2 tabs x 2 days 1 tab x 2 days then stop. Try over the counter Robitussin for cough as needed. Avoid mint and menthol. Try sugar free hard candy for throat soothing. Sips of water instead of throat soothing. Consider PFTs, and 6 minute walk, now that chemotherapy has been completed Follow up with Dr. Ashok Cordia in October as is scheduled.  If you are better call 2 days in advance to cancel. Please contact office for sooner follow up if symptoms do not improve or worsen or seek emergency care

## 2017-05-17 NOTE — Progress Notes (Signed)
Note reviewed.  Antoinett Dorman E. Laetitia Schnepf, M.D. [facility] Pager:  [phone] After 3pm or if no response, call [phone] 7:07 AM 05/17/17    

## 2017-05-30 ENCOUNTER — Ambulatory Visit: Payer: PPO | Admitting: Pulmonary Disease

## 2017-06-18 DIAGNOSIS — H903 Sensorineural hearing loss, bilateral: Secondary | ICD-10-CM | POA: Diagnosis not present

## 2017-06-18 DIAGNOSIS — H9209 Otalgia, unspecified ear: Secondary | ICD-10-CM | POA: Diagnosis not present

## 2017-08-21 DIAGNOSIS — H04123 Dry eye syndrome of bilateral lacrimal glands: Secondary | ICD-10-CM | POA: Diagnosis not present

## 2017-08-21 DIAGNOSIS — H2513 Age-related nuclear cataract, bilateral: Secondary | ICD-10-CM | POA: Diagnosis not present

## 2017-08-21 DIAGNOSIS — H5203 Hypermetropia, bilateral: Secondary | ICD-10-CM | POA: Diagnosis not present

## 2017-09-06 IMAGING — CR DG CHEST 1V
1 series · 1 of 1 positions shown · non-contrast
Comparison: CT chest of 10/26/2015

CLINICAL DATA: Right-sided lung biopsy today

EXAM:
CHEST 1 VIEW

[w chest pa]
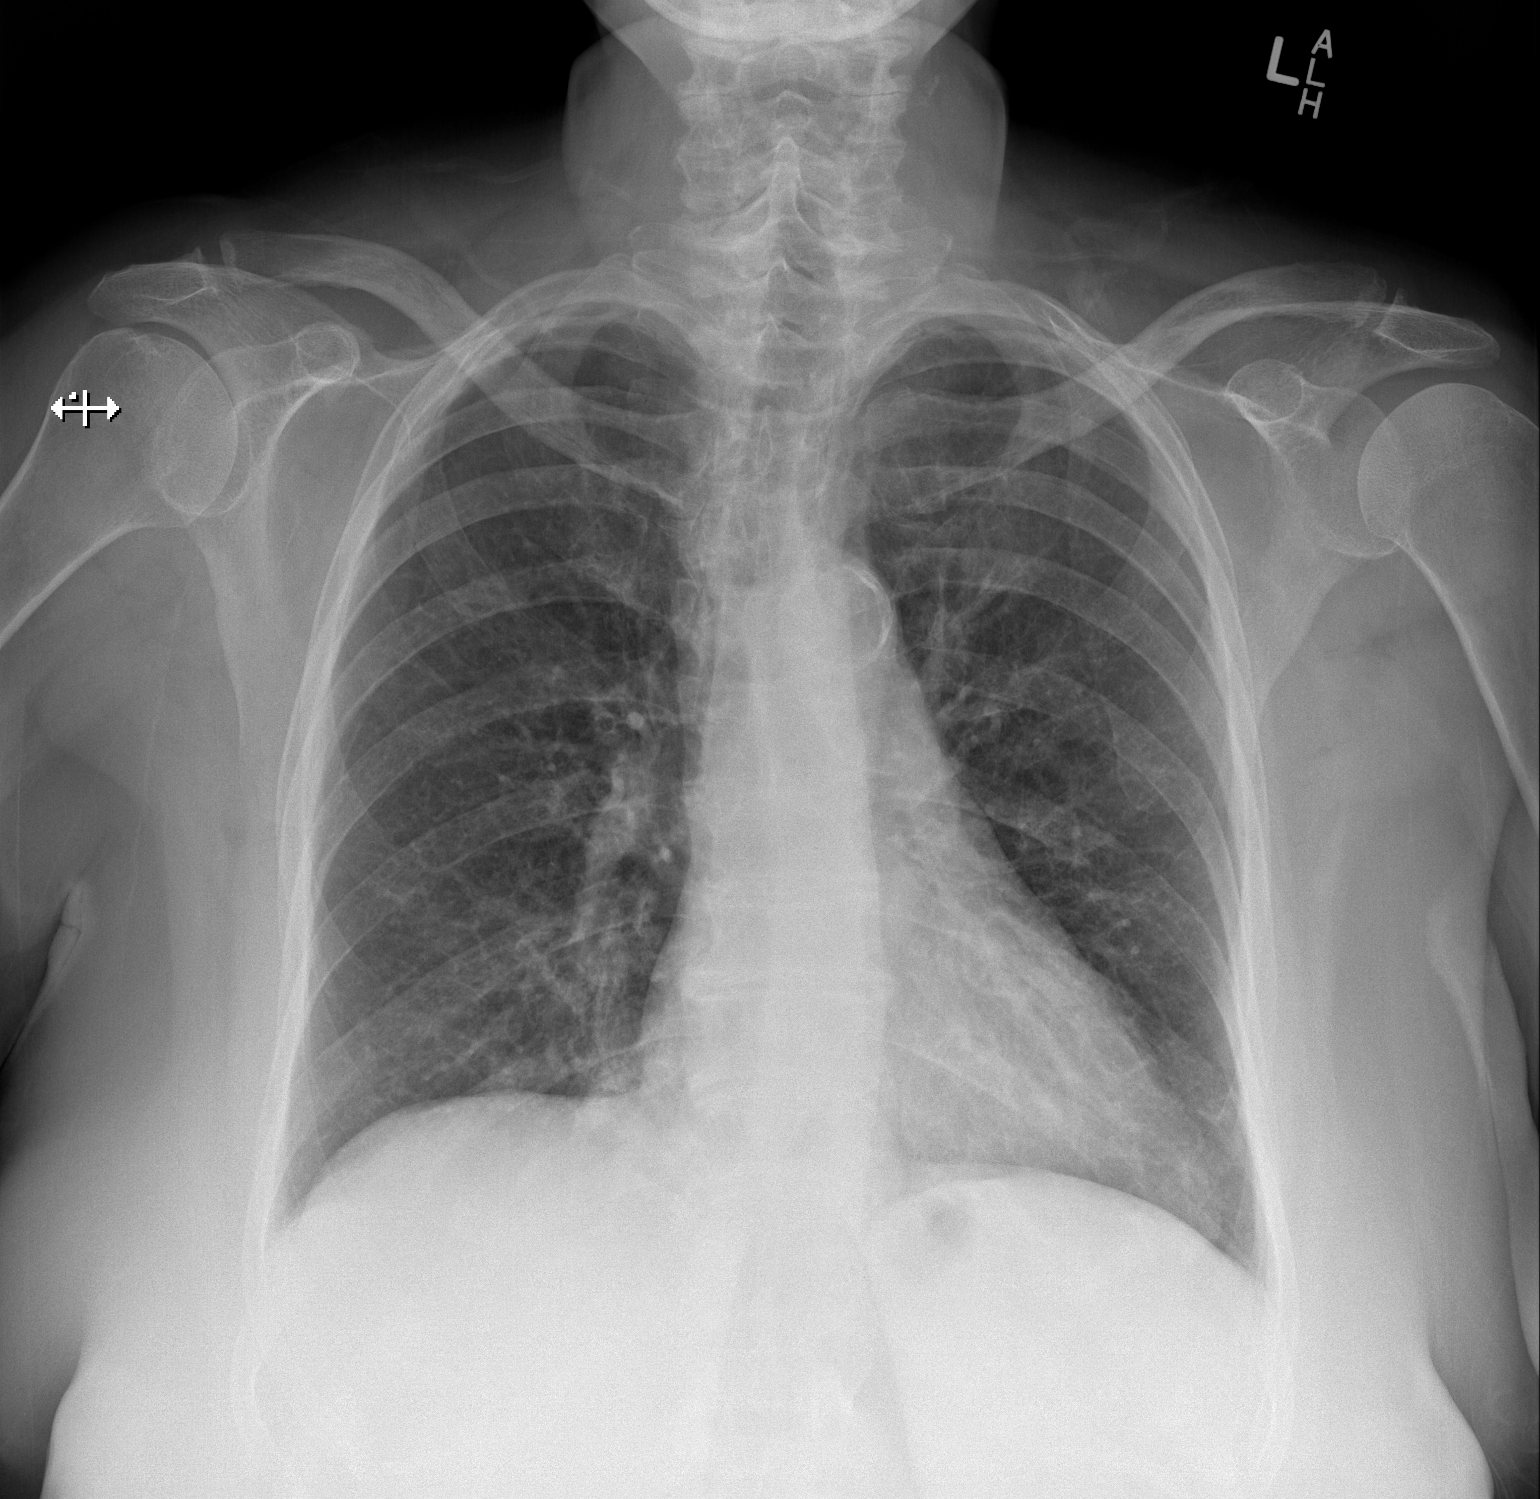

[1 of 1 positions shown; findings below may reference images not displayed]

FINDINGS: After percutaneous biopsy of the opacity medially in the right lower
lobe, no pneumothorax is seen. No pleural effusion is noted. The
left lung is clear. Mediastinal and hilar contours are unremarkable.
The heart is within upper limits of normal.
IMPRESSION: No pneumothorax after biopsy of the opacity in the medial right
lower lobe.

## 2017-09-06 IMAGING — CT CT BIOPSY
2 of 3 series · 13 of 32 positions shown, 18 images · non-contrast
Comparison: none

INDICATION: 74-year-old with suspicious ground-glass density in the medial right
lower lobe.

[Series 2: i-spiral 5.0 b40f · axial · 0.83mm/px · z∈[+1280,+1358]mm · 5 of 39 slices shown (1 of 2)]
[im 6/39  soft-tissue]
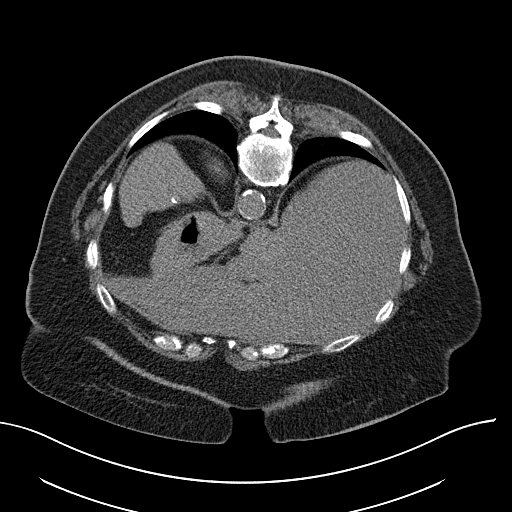
[im 11/39  soft-tissue]
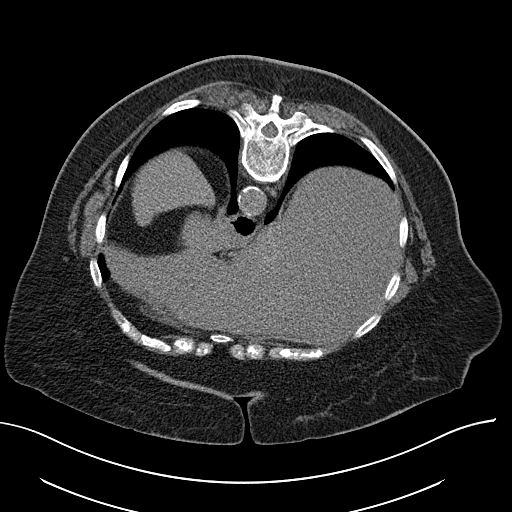
[im 17/39  soft-tissue]
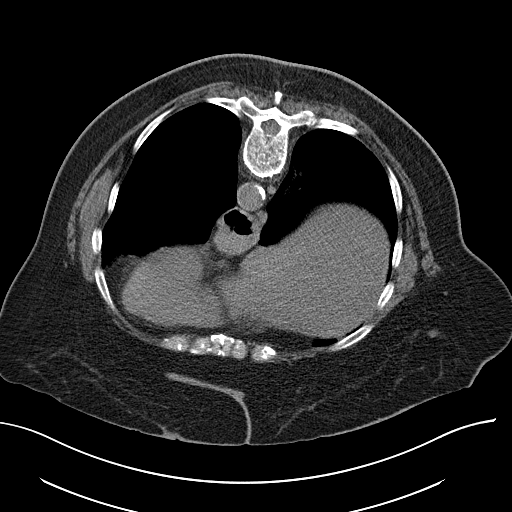
[im 22/39  soft-tissue]
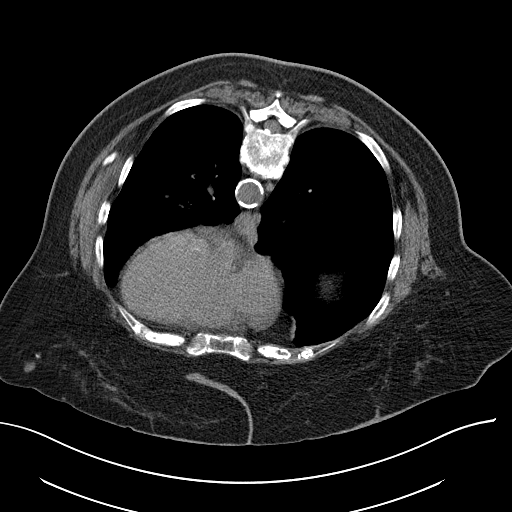
[im 28/39  soft-tissue]
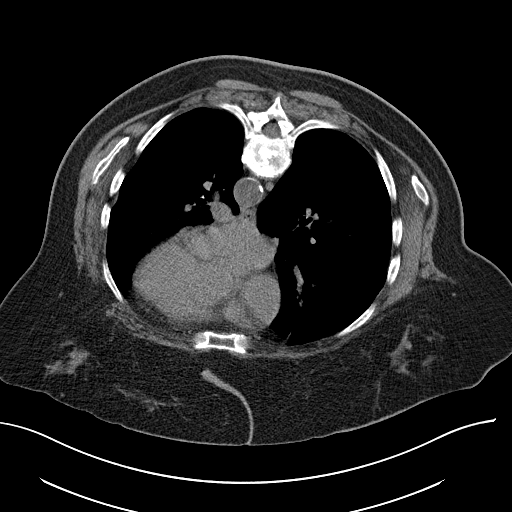

[Series 4: i-spiral 5.0 b40f · axial · 0.91mm/px · z∈[+1314,+1436]mm · 8 of 45 slices shown, 13 images (2 of 2)]
[im 5/45  soft-tissue]
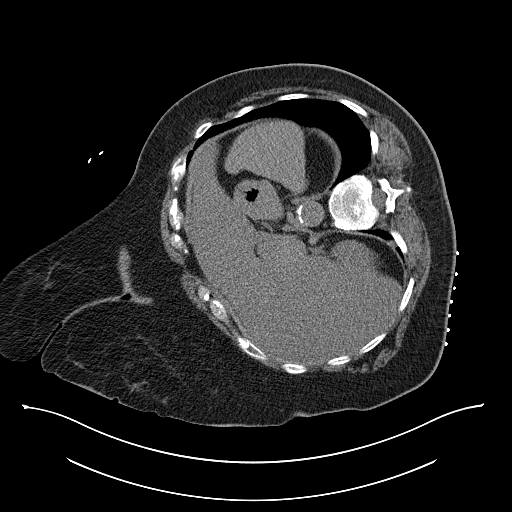
[im 5/45  bone]
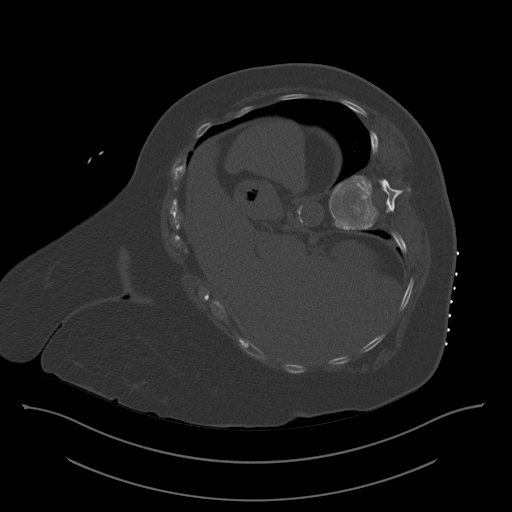
[im 10/45  soft-tissue]
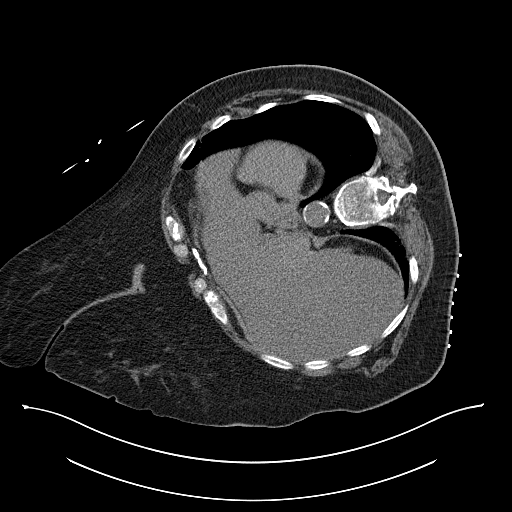
[im 15/45  soft-tissue]
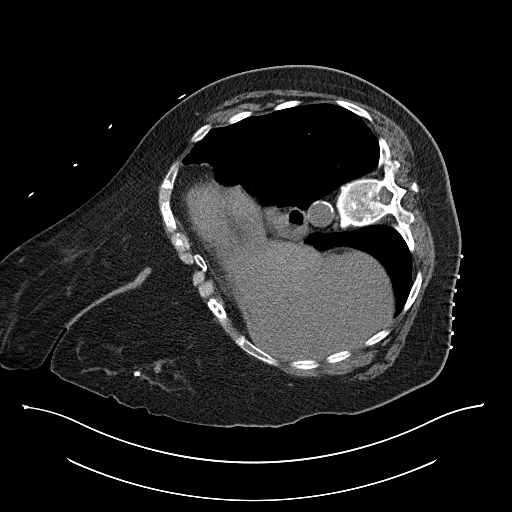
[im 20/45  soft-tissue]
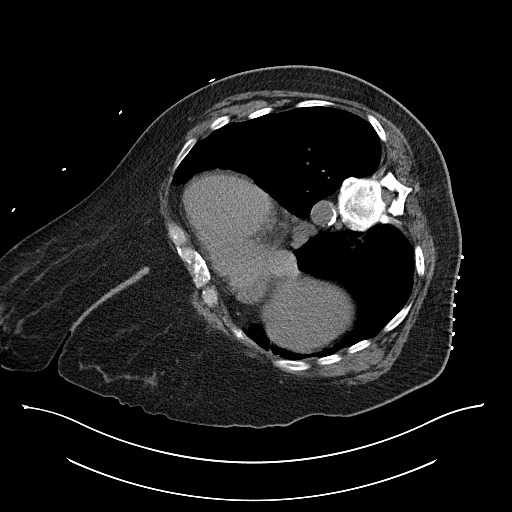
[im 25/45  soft-tissue]
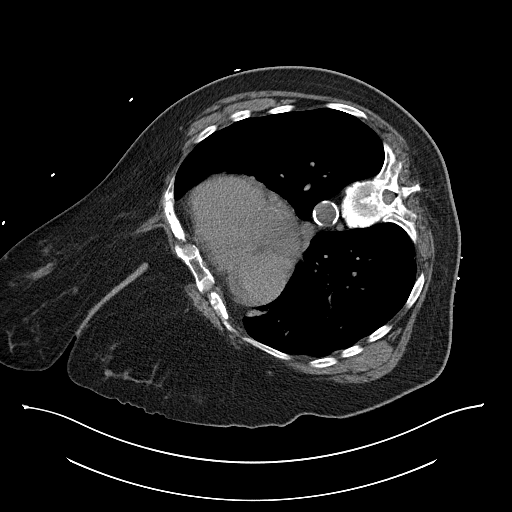
[im 25/45  lung]
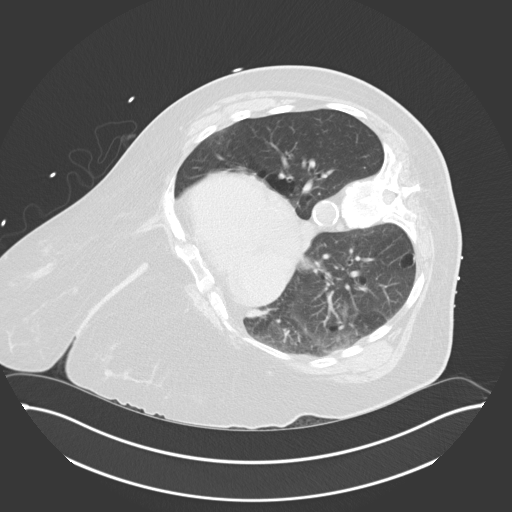
[im 30/45  soft-tissue]
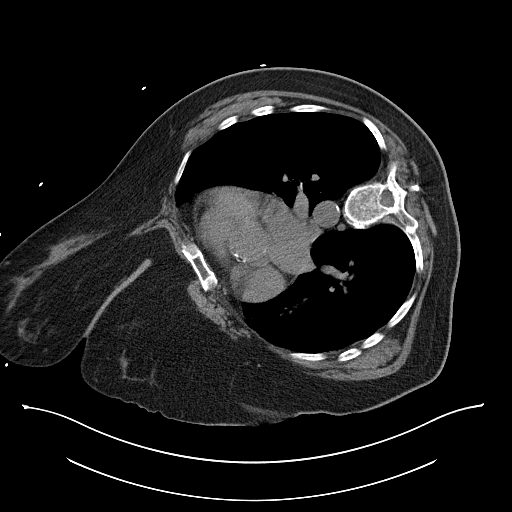
[im 30/45  lung]
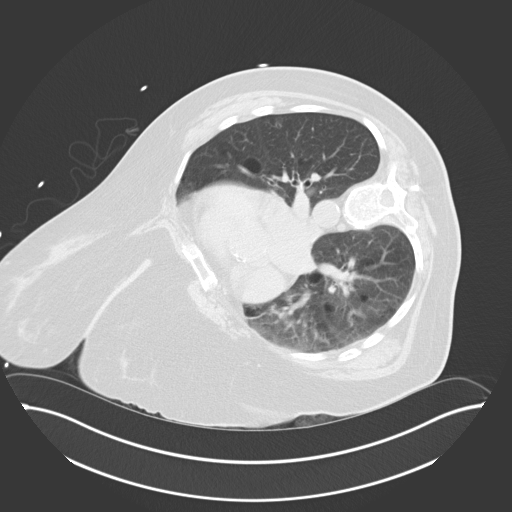
[im 35/45  soft-tissue]
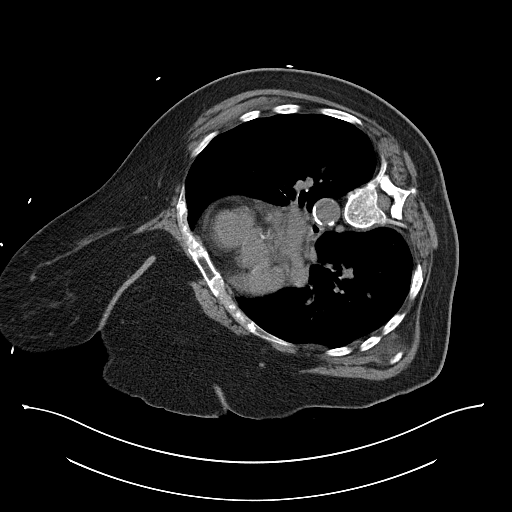
[im 35/45  lung]
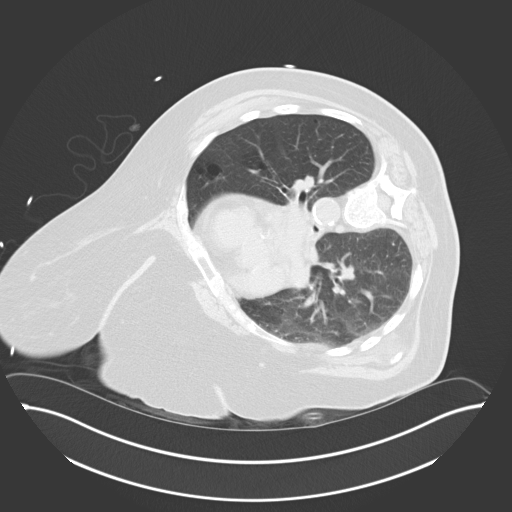
[im 40/45  soft-tissue]
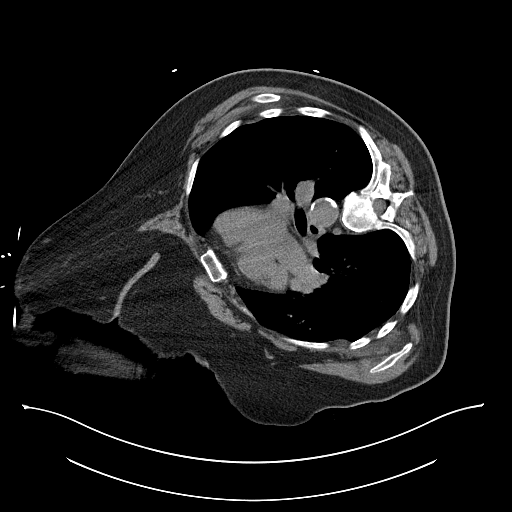
[im 40/45  lung]
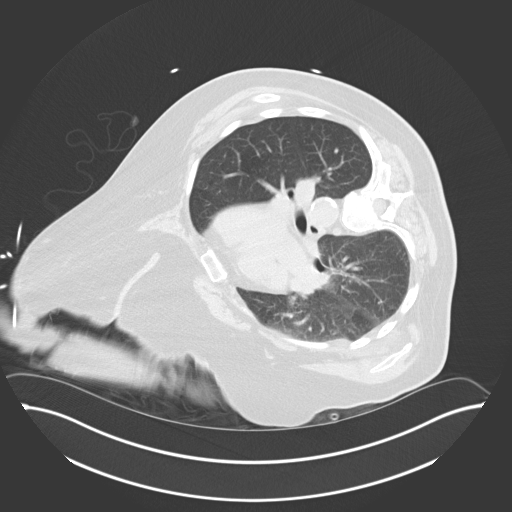

[13 of 32 positions shown; findings below may reference images not displayed]

EXAM:
CT-GUIDED BIOPSY RIGHT LOWER LOBE LESION

MEDICATIONS:
None.

ANESTHESIA/SEDATION:
Moderate (conscious) sedation was employed during this procedure. A
total of Versed 2.5 mg and Fentanyl 125 mcg was administered
intravenously.

Moderate Sedation Time: 20 minutes. The patient's level of
consciousness and vital signs were monitored continuously by
radiology nursing throughout the procedure under my direct
supervision.

FLUOROSCOPY TIME:  None

COMPLICATIONS:
None immediate.

PROCEDURE:
Informed written consent was obtained from the patient after a
thorough discussion of the procedural risks, benefits and
alternatives. All questions were addressed. Maximal Sterile Barrier
Technique was utilized including caps, mask, sterile gowns, sterile
gloves, sterile drape, hand hygiene and skin antiseptic. A timeout
was performed prior to the initiation of the procedure.

Patient was initially placed prone. A percutaneous window was not
identified. Therefore, the patient was repositioned on her right
side. Additional CT images were obtained. The back was prepped with
chlorhexidine and sterile field was created. Skin was anesthetized
with 1% lidocaine. 17 gauge needle was directed into the medial
right lower lobe with CT guidance. The needle was repositioned
multiple times in order to sample the ground-glass opacity. A single
core biopsy was obtained with an 18 gauge device. This sample was
felt to be adequate. No additional core biopsies were obtained due
to the concern for bleeding based on the location of the biopsy. 17
gauge needle was removed without complication. Bandage placed over
the puncture site.
FINDINGS: Again noted is an ill-defined ground-glass opacity along the medial
right lower lobe. Patient has scattered cystic lesions throughout
both lungs. Needle was directed into the ground-glass area along the
medial right lower lobe. Small amount of parenchymal bleeding
identified following the core biopsy. Negative for pneumothorax.
IMPRESSION: Successful CT-guided core biopsy of the right lower lobe lesion.

## 2017-11-20 ENCOUNTER — Telehealth: Payer: Self-pay

## 2017-11-20 NOTE — Telephone Encounter (Signed)
Received VM from pt regarding a question she has about her PET scan coming up in June.  Called pt back and she reported having a "strange tightness in the back of neck, not sure if it's a lump or just arthritis".  Been going on for couple of months.  She asked what area would the PET scan go up to and would this spot in her neck be covered by the PET scan in June?  Noted the PET scan said skull base to thigh and I let pt know.  Pt was pleased and said she will wait until June PET scan- I let the patient know I will route this note to Dr Alvy Bimler in case she would like to see her sooner.  Pt voiced understanding.

## 2017-12-31 ENCOUNTER — Other Ambulatory Visit: Payer: Self-pay | Admitting: Hematology and Oncology

## 2017-12-31 DIAGNOSIS — Z85038 Personal history of other malignant neoplasm of large intestine: Secondary | ICD-10-CM

## 2017-12-31 DIAGNOSIS — C8588 Other specified types of non-Hodgkin lymphoma, lymph nodes of multiple sites: Secondary | ICD-10-CM

## 2018-01-14 ENCOUNTER — Telehealth (INDEPENDENT_AMBULATORY_CARE_PROVIDER_SITE_OTHER): Payer: Self-pay | Admitting: Specialist

## 2018-01-14 NOTE — Telephone Encounter (Signed)
I called and lmom advised her that Dr. Louanne Skye does not do bunions surgeries, however Dr. Sharol Given does do these.  If she wanted to call back and schedule an appointment with Dr. Sharol Given to discuss this further that she could call and make that appt.

## 2018-01-14 NOTE — Telephone Encounter (Signed)
Patient called wanting to know if Louanne Skye does any surgery on bunions.  Please call to advise

## 2018-01-28 ENCOUNTER — Ambulatory Visit (INDEPENDENT_AMBULATORY_CARE_PROVIDER_SITE_OTHER): Payer: Self-pay | Admitting: Orthopedic Surgery

## 2018-02-03 DIAGNOSIS — Z1231 Encounter for screening mammogram for malignant neoplasm of breast: Secondary | ICD-10-CM | POA: Diagnosis not present

## 2018-02-06 ENCOUNTER — Inpatient Hospital Stay: Payer: PPO | Attending: Hematology and Oncology

## 2018-02-06 ENCOUNTER — Encounter (HOSPITAL_COMMUNITY)
Admission: RE | Admit: 2018-02-06 | Discharge: 2018-02-06 | Disposition: A | Discharge status: Home / Self Care | Payer: PPO | Source: Ambulatory Visit | Attending: Hematology and Oncology | Admitting: Hematology and Oncology

## 2018-02-06 DIAGNOSIS — M3500 Sicca syndrome, unspecified: Secondary | ICD-10-CM | POA: Insufficient documentation

## 2018-02-06 DIAGNOSIS — R918 Other nonspecific abnormal finding of lung field: Secondary | ICD-10-CM | POA: Insufficient documentation

## 2018-02-06 DIAGNOSIS — Z8572 Personal history of non-Hodgkin lymphomas: Secondary | ICD-10-CM | POA: Insufficient documentation

## 2018-02-06 DIAGNOSIS — Z85038 Personal history of other malignant neoplasm of large intestine: Secondary | ICD-10-CM | POA: Diagnosis not present

## 2018-02-06 DIAGNOSIS — C8588 Other specified types of non-Hodgkin lymphoma, lymph nodes of multiple sites: Secondary | ICD-10-CM | POA: Diagnosis not present

## 2018-02-06 DIAGNOSIS — I10 Essential (primary) hypertension: Secondary | ICD-10-CM | POA: Insufficient documentation

## 2018-02-06 DIAGNOSIS — C8590 Non-Hodgkin lymphoma, unspecified, unspecified site: Secondary | ICD-10-CM | POA: Diagnosis not present

## 2018-02-06 LAB — COMPREHENSIVE METABOLIC PANEL WITH GFR
ALT: 25 U/L (ref 0–55)
AST: 22 U/L (ref 5–34)
Albumin: 3.9 g/dL (ref 3.5–5.0)
Alkaline Phosphatase: 95 U/L (ref 40–150)
Anion gap: 9 (ref 3–11)
BUN: 14 mg/dL (ref 7–26)
CO2: 26 mmol/L (ref 22–29)
Calcium: 9.6 mg/dL (ref 8.4–10.4)
Chloride: 105 mmol/L (ref 98–109)
Creatinine, Ser: 0.74 mg/dL (ref 0.60–1.10)
GFR calc Af Amer: >60 mL/min
GFR calc non Af Amer: >60 mL/min
Glucose, Bld: 106 mg/dL (ref 70–140)
Potassium: 4.1 mmol/L (ref 3.5–5.1)
Sodium: 140 mmol/L (ref 136–145)
Total Bilirubin: 0.4 mg/dL (ref 0.2–1.2)
Total Protein: 7.5 g/dL (ref 6.4–8.3)

## 2018-02-06 LAB — CBC WITH DIFFERENTIAL/PLATELET
Basophils Absolute: 0 10*3/uL (ref 0.0–0.1)
Basophils Relative: 1 %
Eosinophils Absolute: 0.3 10*3/uL (ref 0.0–0.5)
Eosinophils Relative: 6 %
HCT: 37.8 % (ref 34.8–46.6)
Hemoglobin: 12.4 g/dL (ref 11.6–15.9)
LYMPHS ABS: 1 10*3/uL (ref 0.9–3.3)
LYMPHS PCT: 19 %
MCH: 29.3 pg (ref 25.1–34.0)
MCHC: 32.8 g/dL (ref 31.5–36.0)
MCV: 89.4 fL (ref 79.5–101.0)
MONO ABS: 0.8 10*3/uL (ref 0.1–0.9)
MONOS PCT: 14 %
Neutro Abs: 3.2 10*3/uL (ref 1.5–6.5)
Neutrophils Relative %: 60 %
PLATELETS: 215 10*3/uL (ref 145–400)
RBC: 4.23 MIL/uL (ref 3.70–5.45)
RDW: 13.3 % (ref 11.2–14.5)
WBC: 5.4 10*3/uL (ref 3.9–10.3)

## 2018-02-06 LAB — CEA (IN HOUSE-CHCC): CEA (CHCC-In House): <1 ng/mL (ref 0.00–5.00)

## 2018-02-06 LAB — LACTATE DEHYDROGENASE: LDH: 145 U/L (ref 125–245)

## 2018-02-06 LAB — GLUCOSE, CAPILLARY: Glucose-Capillary: 109 mg/dL — ABNORMAL HIGH (ref 65–99)

## 2018-02-06 MED ORDER — FLUDEOXYGLUCOSE F - 18 (FDG) INJECTION
11.0000 | Freq: Once | INTRAVENOUS | Status: AC | PRN
  Administered 2018-02-06: 11 via INTRAVENOUS

## 2018-02-07 ENCOUNTER — Inpatient Hospital Stay: Payer: PPO | Admitting: Hematology and Oncology

## 2018-02-07 ENCOUNTER — Telehealth: Payer: Self-pay | Admitting: Hematology and Oncology

## 2018-02-07 ENCOUNTER — Encounter: Payer: Self-pay | Admitting: Hematology and Oncology

## 2018-02-07 DIAGNOSIS — Z85038 Personal history of other malignant neoplasm of large intestine: Secondary | ICD-10-CM

## 2018-02-07 DIAGNOSIS — I10 Essential (primary) hypertension: Secondary | ICD-10-CM | POA: Diagnosis not present

## 2018-02-07 DIAGNOSIS — Z8572 Personal history of non-Hodgkin lymphomas: Secondary | ICD-10-CM

## 2018-02-07 DIAGNOSIS — M3500 Sicca syndrome, unspecified: Secondary | ICD-10-CM | POA: Diagnosis not present

## 2018-02-07 DIAGNOSIS — C8588 Other specified types of non-Hodgkin lymphoma, lymph nodes of multiple sites: Secondary | ICD-10-CM

## 2018-02-07 DIAGNOSIS — R918 Other nonspecific abnormal finding of lung field: Secondary | ICD-10-CM

## 2018-02-07 NOTE — Assessment & Plan Note (Signed)
She has intermittent elevated blood pressure likely due to anxiety I recommend close follow-up with primary care doctor for further management and blood pressure monitoring

## 2018-02-07 NOTE — Assessment & Plan Note (Signed)
She has not seen a rheumatologist for some time She continue topical eyedrops for dry eyes She complained of significant joint discomfort I recommend consideration to see her rheumatologist again for further management She would like to think about it and will discuss with the primary care doctor about this

## 2018-02-07 NOTE — Telephone Encounter (Signed)
Gave patient avs and calendar of upcoming June 2020 appointments.  °

## 2018-02-07 NOTE — Progress Notes (Signed)
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 Her most recent colonoscopy were within normal limits. She does not require further adjuvant treatment. PET scan is negative  Lung infiltrate on CT The changes in the lungs were stable She does not need long-term imaging study She is not symptomatic  SJOGREN'S SYNDROME She has not seen a rheumatologist for some time She continue topical eyedrops for dry eyes She complained of significant joint discomfort I recommend consideration to see her rheumatologist again for further management She would like to think about it and will discuss with the primary care doctor about this  Essential hypertension She has intermittent elevated blood pressure likely due to anxiety I recommend close follow-up with primary care doctor for further management and blood pressure monitoring   No orders of the defined types were placed in this encounter.   INTERVAL HISTORY: Please see below for problem oriented charting. 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 Please refer to report for other findings      11/10/2015 Procedure    she underwent CT guided biopsy      11/10/2015 Pathology Results    Accession: [id number] right lung CT-guided biopsy suspicious for lymphoma      12/02/2015 - 12/23/2015 Chemotherapy    She received 4 doses of weekly rituximab.      02/10/2016 PET scan    There is no evidence for residual or recurrent hypermetabolic adenopathy      5/00/9381 Imaging    The masslike area of concern in the medial right lung base seen on the previous study is unchanged in size since March 2017. A biopsy was previously recommended after the CT scan from October 26, 2015. Please see that report for further recommendations as the finding was better characterized at that time. 2. No pulmonary emboli. 3. Multiple thin walled cysts in the mid and lower lungs is stable. 4. 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      02/06/2017 PET scan    No evidence of active lymphomatous involvement. Stable 1.5 cm mixed cystic/ solid nodule in the  right lower lobe, non FDG avid. Continued attention on follow-up is suggested.      02/13/2017 Procedure    Colonoscopy - One 8 mm polyp in the cecum, removed with a cold snare. Resected and retrieved. - Two 3 to 4 mm polyps in the transverse colon, removed with a cold biopsy forceps. Resected and retrieved. - One 5 mm polyp in the descending colon,  removed with a cold snare. Resected and retrieved. - Two 3 to 5 mm polyps at the recto-sigmoid colon, removed with a cold snare. Resected and retrieved. - Mild diverticulosis in the sigmoid colon. - Internal hemorrhoids.         02/06/2018 PET scan    Negative PET-CT. No findings for enlarged or hypermetabolic lymph nodes to suggest lymphoma.       REVIEW OF SYSTEMS:   Constitutional: Denies fevers, chills or abnormal weight loss Eyes: Denies blurriness of vision Ears, nose, mouth, throat, and face: Denies mucositis or sore throat Respiratory: Denies cough, dyspnea or wheezes Cardiovascular: Denies palpitation, chest discomfort or lower extremity swelling Gastrointestinal:  Denies nausea, heartburn or change in bowel habits Skin: Denies abnormal skin rashes Lymphatics: Denies new lymphadenopathy or easy bruising Neurological:Denies numbness, tingling or new weaknesses Behavioral/Psych: Mood is stable, no new changes  All other systems were reviewed with the patient and are negative.  I have reviewed the past medical history, past surgical history, social history and family history with the patient and they are unchanged from previous note.  ALLERGIES:  is allergic to losartan; amoxicillin; beta adrenergic blockers; and clindamycin/lincomycin.  MEDICATIONS:  Current Outpatient Medications  Medication Sig Dispense Refill  . Calcium Carbonate-Vitamin D (CALCIUM + D) 600-200 MG-UNIT TABS Take 2 tablets by mouth daily.      . hydroxypropyl methylcellulose / hypromellose (ISOPTO TEARS / GONIOVISC) 2.5 % ophthalmic solution Place 1 drop into both eyes 3 (three) times daily as needed for dry eyes.    Marland Kitchen POTASSIUM CHLORIDE PO Take 1 tablet by mouth 3 (three) times daily. OTC    . simvastatin (ZOCOR) 10 MG tablet Take 1 tablet (10 mg total) by mouth at bedtime. 90 tablet 1  . Specialty Vitamins Products (MAGNESIUM, AMINO ACID CHELATE,) 133 MG tablet Take 1 tablet by mouth daily.    . vitamin  B-12 (CYANOCOBALAMIN) 1000 MCG tablet Take 1,000 mcg by mouth daily.     Current Facility-Administered Medications  Medication Dose Route Frequency Provider Last Rate Last Dose  . 0.9 %  sodium chloride infusion  500 mL Intravenous Continuous Pyrtle, Lajuan Lines, MD        PHYSICAL EXAMINATION: ECOG PERFORMANCE STATUS: 1 - Symptomatic but completely ambulatory  Vitals:   02/07/18 1059  BP: (!) 178/72  Pulse: 84  Resp: 18  Temp: 98.3 F (36.8 C)  SpO2: 97%   Filed Weights   02/07/18 1059  Weight: 220 lb 12.8 oz (100.2 kg)    GENERAL:alert, no distress and comfortable SKIN: skin color, texture, turgor are normal, no rashes or significant lesions EYES: normal, Conjunctiva are pink and non-injected, sclera clear OROPHARYNX:no exudate, no erythema and lips, buccal mucosa, and tongue normal  NECK: supple, thyroid normal size, non-tender, without nodularity.  Well-healed surgical scar LYMPH:  no palpable lymphadenopathy in the cervical, axillary or inguinal LUNGS: clear to auscultation and percussion with normal breathing effort HEART: regular rate & rhythm and no murmurs and no lower extremity edema ABDOMEN:abdomen soft, non-tender and normal bowel sounds Musculoskeletal:no cyanosis of digits and no clubbing.  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 NEURO: alert & oriented x 3 with fluent speech, no focal motor/sensory deficits  LABORATORY DATA:  I have reviewed the data as listed    Component Value Date/Time   NA 140 02/06/2018 0847   NA 143 02/06/2017 1302   K 4.1 02/06/2018 0847   K 4.8 02/06/2017 1302   CL 105 02/06/2018 0847   CO2 26 02/06/2018 0847   CO2 26 02/06/2017 1302   GLUCOSE 106 02/06/2018 0847   GLUCOSE 100 02/06/2017 1302   BUN 14 02/06/2018 0847   BUN 14.2 02/06/2017 1302   CREATININE 0.74 02/06/2018 0847   CREATININE 0.8 02/06/2017 1302   CALCIUM 9.6 02/06/2018 0847   CALCIUM 9.7 02/06/2017 1302   PROT 7.5 02/06/2018 0847   PROT 7.8 02/06/2017 1302    ALBUMIN 3.9 02/06/2018 0847   ALBUMIN 3.9 02/06/2017 1302   AST 22 02/06/2018 0847   AST 20 02/06/2017 1302   ALT 25 02/06/2018 0847   ALT 25 02/06/2017 1302   ALKPHOS 95 02/06/2018 0847   ALKPHOS 105 02/06/2017 1302   BILITOT 0.4 02/06/2018 0847   BILITOT 0.41 02/06/2017 1302   GFRNONAA >60 02/06/2018 0847   GFRAA >60 02/06/2018 0847    No results found for: SPEP, UPEP  Lab Results  Component Value Date   WBC 5.4 02/06/2018   NEUTROABS 3.2 02/06/2018   HGB 12.4 02/06/2018   HCT 37.8 02/06/2018   MCV 89.4 02/06/2018   PLT 215 02/06/2018      Chemistry      Component Value Date/Time   NA 140 02/06/2018 0847   NA 143 02/06/2017 1302   K 4.1 02/06/2018 0847   K 4.8 02/06/2017 1302   CL 105 02/06/2018 0847   CO2 26 02/06/2018 0847   CO2 26 02/06/2017 1302   BUN 14 02/06/2018 0847   BUN 14.2 02/06/2017 1302   CREATININE 0.74 02/06/2018 0847   CREATININE 0.8 02/06/2017 1302      Component Value Date/Time   CALCIUM 9.6 02/06/2018 0847   CALCIUM 9.7 02/06/2017 1302   ALKPHOS 95 02/06/2018 0847   ALKPHOS 105 02/06/2017 1302   AST 22 02/06/2018 0847   AST 20 02/06/2017 1302   ALT 25 02/06/2018 0847   ALT 25 02/06/2017 1302   BILITOT 0.4 02/06/2018 0847   BILITOT 0.41 02/06/2017 1302       RADIOGRAPHIC STUDIES: I have reviewed extensive imaging studies with the patient I have personally reviewed the radiological images as listed and agreed with the findings in the report. Nm Pet Image Restag (ps) Skull Base To Thigh  Result Date: 02/06/2018 CLINICAL DATA:  Subsequent treatment strategy for lymphoma. EXAM: NUCLEAR MEDICINE PET SKULL BASE TO THIGH TECHNIQUE: 11.0 mCi F-18 FDG was injected intravenously. Full-ring PET imaging was performed from the skull base to thigh after the radiotracer. CT data was obtained and used for attenuation correction and anatomic localization. Fasting blood glucose: 109 mg/dl COMPARISON:  Multiple prior PET CTs.  The most recent is  02/06/2017 FINDINGS: Mediastinal blood pool activity: SUV max 3.23 NECK: No hypermetabolic lymph nodes in the neck. Incidental CT findings: none CHEST: No hypermetabolic mediastinal or hilar nodes. No breast masses, supraclavicular or axillary adenopathy. No suspicious pulmonary nodules on the CT scan. Incidental CT findings: Stable cystic changes in the lungs with a stable area of cystic change and calcification the right lower lobe. No worrisome pulmonary lesions or hypermetabolism. Stable dense atherosclerotic calcifications involving the aorta and stable coronary artery  calcifications. Small hiatal hernia. ABDOMEN/PELVIS: No abnormal hypermetabolic activity within the liver, pancreas, adrenal glands, or spleen. No hypermetabolic lymph nodes in the abdomen or pelvis. Incidental CT findings: Stable dense atherosclerotic calcifications involving the abdominal aorta. Small duodenum diverticulum. SKELETON: No focal hypermetabolic activity to suggest skeletal metastasis. Incidental CT findings: none IMPRESSION: Negative PET-CT. No findings for enlarged or hypermetabolic lymph nodes to suggest lymphoma. Electronically Signed   By: Marijo Sanes M.D.   On: 02/06/2018 14:38    All questions were answered. The patient knows to call the clinic with any problems, questions or concerns. No barriers to learning was detected.  I spent 15 minutes counseling the patient face to face. The total time spent in the appointment was 20 minutes and more than 50% was on counseling and review of test results  Heath Lark, MD 02/07/2018 11:17 AM

## 2018-02-07 NOTE — Assessment & Plan Note (Signed)
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 Her most recent colonoscopy were within normal limits. She does not require further adjuvant treatment. PET scan is negative

## 2018-02-07 NOTE — Assessment & Plan Note (Signed)
The changes in the lungs were stable She does not need long-term imaging study She is not symptomatic

## 2018-02-07 NOTE — Assessment & Plan Note (Signed)
Clinically and radiographically, she has no signs of cancer recurrence I will see her once a year with history, physical examination and blood work only and to defer imaging study only if necessary The patient is educated to watch out for signs and symptoms of cancer recurrence

## 2018-04-29 DIAGNOSIS — C8511 Unspecified B-cell lymphoma, lymph nodes of head, face, and neck: Secondary | ICD-10-CM | POA: Diagnosis not present

## 2018-04-29 DIAGNOSIS — I10 Essential (primary) hypertension: Secondary | ICD-10-CM | POA: Diagnosis not present

## 2018-04-29 DIAGNOSIS — R7303 Prediabetes: Secondary | ICD-10-CM | POA: Diagnosis not present

## 2018-04-29 DIAGNOSIS — E785 Hyperlipidemia, unspecified: Secondary | ICD-10-CM | POA: Diagnosis not present

## 2018-04-29 DIAGNOSIS — Z0000 Encounter for general adult medical examination without abnormal findings: Secondary | ICD-10-CM | POA: Diagnosis not present

## 2018-04-29 DIAGNOSIS — M3500 Sicca syndrome, unspecified: Secondary | ICD-10-CM | POA: Diagnosis not present

## 2018-05-13 DIAGNOSIS — Z23 Encounter for immunization: Secondary | ICD-10-CM | POA: Diagnosis not present

## 2018-09-18 DIAGNOSIS — H2512 Age-related nuclear cataract, left eye: Secondary | ICD-10-CM | POA: Diagnosis not present

## 2018-09-18 DIAGNOSIS — H25812 Combined forms of age-related cataract, left eye: Secondary | ICD-10-CM | POA: Diagnosis not present

## 2018-10-03 DIAGNOSIS — M19041 Primary osteoarthritis, right hand: Secondary | ICD-10-CM | POA: Diagnosis not present

## 2018-10-03 DIAGNOSIS — M1811 Unilateral primary osteoarthritis of first carpometacarpal joint, right hand: Secondary | ICD-10-CM | POA: Insufficient documentation

## 2018-10-03 DIAGNOSIS — R52 Pain, unspecified: Secondary | ICD-10-CM | POA: Diagnosis not present

## 2018-10-03 DIAGNOSIS — M65341 Trigger finger, right ring finger: Secondary | ICD-10-CM | POA: Diagnosis not present

## 2018-10-16 DIAGNOSIS — H2511 Age-related nuclear cataract, right eye: Secondary | ICD-10-CM | POA: Diagnosis not present

## 2018-10-16 DIAGNOSIS — H25811 Combined forms of age-related cataract, right eye: Secondary | ICD-10-CM | POA: Diagnosis not present

## 2018-11-05 DIAGNOSIS — M19041 Primary osteoarthritis, right hand: Secondary | ICD-10-CM | POA: Diagnosis not present

## 2018-11-05 DIAGNOSIS — M65341 Trigger finger, right ring finger: Secondary | ICD-10-CM | POA: Diagnosis not present

## 2018-11-07 ENCOUNTER — Telehealth: Payer: Self-pay

## 2018-11-07 NOTE — Telephone Encounter (Signed)
She called and left a message. Asking if Dr. Alvy Bimler could make a recommendation for a pulmonary specialist?

## 2018-11-07 NOTE — Telephone Encounter (Signed)
Due to recent outbreak, appointment availability is limited  I do not know if they will take any new patients  I typically just refer my patients to [facility]

## 2018-11-07 NOTE — Telephone Encounter (Signed)
Called and given below message. She verbalized understanding. 

## 2019-01-28 DIAGNOSIS — H9209 Otalgia, unspecified ear: Secondary | ICD-10-CM | POA: Diagnosis not present

## 2019-02-02 ENCOUNTER — Other Ambulatory Visit: Payer: Self-pay | Admitting: Hematology and Oncology

## 2019-02-02 DIAGNOSIS — C8588 Other specified types of non-Hodgkin lymphoma, lymph nodes of multiple sites: Secondary | ICD-10-CM

## 2019-02-02 DIAGNOSIS — Z85038 Personal history of other malignant neoplasm of large intestine: Secondary | ICD-10-CM

## 2019-02-09 ENCOUNTER — Other Ambulatory Visit: Payer: Self-pay

## 2019-02-09 ENCOUNTER — Inpatient Hospital Stay: Payer: PPO | Attending: Hematology and Oncology

## 2019-02-09 ENCOUNTER — Inpatient Hospital Stay (HOSPITAL_BASED_OUTPATIENT_CLINIC_OR_DEPARTMENT_OTHER): Payer: PPO | Admitting: Hematology and Oncology

## 2019-02-09 VITALS — BP 152/55 | HR 83 | Temp 98.9°F | Resp 17 | Wt 217.8 lb

## 2019-02-09 DIAGNOSIS — Z85038 Personal history of other malignant neoplasm of large intestine: Secondary | ICD-10-CM | POA: Insufficient documentation

## 2019-02-09 DIAGNOSIS — R0789 Other chest pain: Secondary | ICD-10-CM

## 2019-02-09 DIAGNOSIS — Z9221 Personal history of antineoplastic chemotherapy: Secondary | ICD-10-CM | POA: Diagnosis not present

## 2019-02-09 DIAGNOSIS — R918 Other nonspecific abnormal finding of lung field: Secondary | ICD-10-CM | POA: Diagnosis not present

## 2019-02-09 DIAGNOSIS — Z8572 Personal history of non-Hodgkin lymphomas: Secondary | ICD-10-CM | POA: Insufficient documentation

## 2019-02-09 DIAGNOSIS — R079 Chest pain, unspecified: Secondary | ICD-10-CM

## 2019-02-09 DIAGNOSIS — R002 Palpitations: Secondary | ICD-10-CM

## 2019-02-09 DIAGNOSIS — C8588 Other specified types of non-Hodgkin lymphoma, lymph nodes of multiple sites: Secondary | ICD-10-CM

## 2019-02-09 LAB — CBC WITH DIFFERENTIAL/PLATELET
Abs Immature Granulocytes: 0.02 10*3/uL (ref 0.00–0.07)
Basophils Absolute: 0 10*3/uL (ref 0.0–0.1)
Basophils Relative: 1 %
Eosinophils Absolute: 0.3 10*3/uL (ref 0.0–0.5)
Eosinophils Relative: 5 %
HCT: 40.2 % (ref 36.0–46.0)
Hemoglobin: 12.8 g/dL (ref 12.0–15.0)
Immature Granulocytes: 0 %
Lymphocytes Relative: 17 %
Lymphs Abs: 1.1 10*3/uL (ref 0.7–4.0)
MCH: 29.2 pg (ref 26.0–34.0)
MCHC: 31.8 g/dL (ref 30.0–36.0)
MCV: 91.8 fL (ref 80.0–100.0)
Monocytes Absolute: 0.9 10*3/uL (ref 0.1–1.0)
Monocytes Relative: 13 %
Neutro Abs: 4.1 10*3/uL (ref 1.7–7.7)
Neutrophils Relative %: 64 %
Platelets: 217 10*3/uL (ref 150–400)
RBC: 4.38 MIL/uL (ref 3.87–5.11)
RDW: 12.8 % (ref 11.5–15.5)
WBC: 6.4 10*3/uL (ref 4.0–10.5)
nRBC: 0 % (ref 0.0–0.2)

## 2019-02-09 LAB — COMPREHENSIVE METABOLIC PANEL
ALT: 20 U/L (ref 0–44)
AST: 17 U/L (ref 15–41)
Albumin: 4 g/dL (ref 3.5–5.0)
Alkaline Phosphatase: 99 U/L (ref 38–126)
Anion gap: 8 (ref 5–15)
BUN: 12 mg/dL (ref 8–23)
CO2: 27 mmol/L (ref 22–32)
Calcium: 9.4 mg/dL (ref 8.9–10.3)
Chloride: 105 mmol/L (ref 98–111)
Creatinine, Ser: 0.77 mg/dL (ref 0.44–1.00)
GFR calc Af Amer: >60 mL/min (ref >60)
GFR calc non Af Amer: >60 mL/min (ref >60)
Glucose, Bld: 103 mg/dL — ABNORMAL HIGH (ref 70–99)
Potassium: 4.5 mmol/L (ref 3.5–5.1)
Sodium: 140 mmol/L (ref 135–145)
Total Bilirubin: 0.4 mg/dL (ref 0.3–1.2)
Total Protein: 7.8 g/dL (ref 6.5–8.1)

## 2019-02-09 LAB — CEA (IN HOUSE-CHCC): CEA (CHCC-In House): <1 ng/mL (ref 0.00–5.00)

## 2019-02-10 ENCOUNTER — Encounter: Payer: Self-pay | Admitting: Hematology and Oncology

## 2019-02-10 NOTE — Assessment & Plan Note (Signed)
Clinically, she has no signs of cancer recurrence There is no benefit for routine surveillance imaging study Her last PET CT scan from 2019 showed no evidence of cancer recurrence I will continue to see her once a year 

## 2019-02-10 NOTE — Assessment & Plan Note (Signed)
She has no signs or symptoms to suggest cancer recurrence Her tumor marker is normal 

## 2019-02-10 NOTE — Assessment & Plan Note (Signed)
She has nonspecific chest pain 2 days ago We performed an EKG that look unremarkable I recommend the patient to seek further evaluation through her primary care doctor or new referral back to her cardiologist At this point in time, she does not appear to be interested to pursue either option We discussed the importance of aggressive risk factor modification The patient is mildly hypertensive and I recommend follow-up with primary care doctor for medication adjustment

## 2019-02-10 NOTE — Assessment & Plan Note (Signed)
The patient is inquiring about CT imaging result from 2017 which showed abnormal lung findings I redirected her focus to PET CT scan from 2019 which show no evidence of changes or concerns She does not need long-term follow-up in that regard

## 2019-02-10 NOTE — Progress Notes (Signed)
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 [facility]) Clinically, she has no signs of cancer recurrence There is no benefit for routine surveillance imaging study Her last PET CT scan from 2019 showed no evidence of cancer recurrence I will continue to see her once a year  History of colon cancer, stage I She has no signs or symptoms to suggest cancer recurrence Her tumor marker is normal  CHEST PAIN UNSPECIFIED She has nonspecific chest pain 2 days ago We performed an EKG that look unremarkable I recommend the patient to seek further evaluation through her primary care doctor or new referral back to her cardiologist At this point in time, she does not appear to be interested to pursue either option We discussed the importance of aggressive risk factor modification The patient is mildly hypertensive and I recommend follow-up with primary care doctor for medication adjustment  Lung infiltrate on CT The patient is inquiring about CT imaging result from 2017 which showed abnormal lung findings I redirected her focus to PET CT scan from 2019 which show no evidence of changes or concerns She does not need long-term follow-up in that regard   Orders Placed This Encounter  Procedures  . EKG 12-Lead    Standing Status:   Standing    Number of Occurrences:   1    Order Specific Question:   Reason for Exam    Answer:   chest pressure    INTERVAL HISTORY: Please see below for problem oriented charting. She is seen today as part of her yearly follow-up for lymphoma She denies new  lymphadenopathy No abnormal weight loss or night sweats She review with me some nonspecific retrosternal chest pain lasting for several minutes when she was reading It was not associated with any radiation to her arm, shortness of breath, dizziness or sweating She denies other symptoms like this before She recalls being referred to see cardiologist 7 years ago She has not seek further evaluation in this regard She continues to have chronic dry mouth from Sjogren's syndrome but does not see a rheumatologist for this She tripped and fell on her right hand back in February with severe pain Reportedly, she saw a hand surgeon and was told there was nothing broken She has not seen her internist for some time  SUMMARY OF ONCOLOGIC HISTORY: Oncology History  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 Please refer to report for other findings   11/10/2015 Procedure   she underwent CT guided biopsy   11/10/2015 Pathology Results   Accession: [id number] right lung CT-guided biopsy suspicious for lymphoma   12/02/2015 - 12/23/2015 Chemotherapy   She received 4 doses of weekly rituximab.   02/10/2016 PET scan   There is no evidence for residual or recurrent hypermetabolic adenopathy   10/01/9415 Imaging   The masslike area of concern in the medial right lung base  seen on the previous study is unchanged in size since March 2017. A biopsy was previously recommended after the CT scan from October 26, 2015. Please see that report for further recommendations as the finding was better characterized at that time. 2. No pulmonary emboli. 3. Multiple thin walled cysts in the mid and lower lungs is stable. 4. 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   02/06/2017 PET scan   No evidence of active lymphomatous involvement. Stable 1.5 cm mixed cystic/ solid nodule in the right lower lobe, non FDG avid. Continued attention on follow-up is suggested.   02/13/2017 Procedure   Colonoscopy - One 8 mm polyp in the cecum, removed with a cold snare. Resected and retrieved. - Two 3 to 4 mm polyps in the transverse colon, removed with a cold biopsy forceps. Resected and retrieved. - One 5 mm polyp in the descending colon, removed with a cold snare. Resected and retrieved. - Two 3 to 5 mm polyps at the recto-sigmoid colon, removed with a cold snare. Resected and retrieved. - Mild diverticulosis in the sigmoid colon. - Internal hemorrhoids.      02/06/2018 PET scan   Negative PET-CT. No findings for enlarged or hypermetabolic lymph nodes to suggest lymphoma.     REVIEW OF SYSTEMS:   Constitutional: Denies fevers, chills or abnormal weight loss Eyes: Denies blurriness of vision Ears, nose, mouth, throat, and face: Denies mucositis or sore throat Respiratory: Denies cough, dyspnea or wheezes Gastrointestinal:  Denies nausea, heartburn or change in bowel habits Skin: Denies abnormal skin rashes Lymphatics: Denies new lymphadenopathy or easy bruising Neurological:Denies numbness, tingling or new weaknesses Behavioral/Psych: Mood is stable, no new changes  All other systems were reviewed with the patient and are negative.  I have reviewed the past medical history, past surgical history, social history and family history with the patient and they are unchanged from previous note.  ALLERGIES:  is allergic to losartan; amoxicillin; beta adrenergic blockers; and clindamycin/lincomycin.  MEDICATIONS:  Current Outpatient Medications  Medication Sig Dispense Refill  . Calcium Carbonate-Vitamin D (CALCIUM + D) 600-200 MG-UNIT TABS Take 2 tablets by mouth daily.      . hydroxypropyl methylcellulose / hypromellose (ISOPTO TEARS /  GONIOVISC) 2.5 % ophthalmic solution Place 1 drop into both eyes 3 (three) times daily as needed for dry eyes.    Marland Kitchen POTASSIUM CHLORIDE PO Take 1 tablet by mouth 3 (three) times daily. OTC    . simvastatin (ZOCOR) 10 MG tablet Take 1 tablet (10 mg total) by mouth at bedtime. 90 tablet 1  . Specialty Vitamins Products (MAGNESIUM, AMINO ACID CHELATE,) 133 MG tablet Take 1 tablet by mouth daily.    . vitamin B-12 (CYANOCOBALAMIN) 1000 MCG tablet Take 1,000 mcg by mouth daily.     Current Facility-Administered Medications  Medication Dose Route Frequency Provider Last Rate Last Dose  . 0.9 %  sodium chloride infusion  500 mL Intravenous Continuous Pyrtle, Lajuan Lines, MD        PHYSICAL EXAMINATION: ECOG PERFORMANCE STATUS: 1 - Symptomatic but completely ambulatory  Vitals:   02/09/19 1058  BP: (!) 152/55  Pulse: 83  Resp: 17  Temp: 98.9 F (37.2 C)  SpO2: 96%   Filed Weights   02/09/19 1058  Weight: 217 lb 12.8 oz (98.8 kg)    GENERAL:alert, no distress and comfortable SKIN: skin color, texture, turgor are normal, no rashes or significant lesions EYES: normal, Conjunctiva are pink and non-injected, sclera clear OROPHARYNX:no exudate, no erythema and  lips, buccal mucosa, and tongue normal  NECK: supple, thyroid normal size, non-tender, without nodularity LYMPH:  no palpable lymphadenopathy in the cervical, axillary or inguinal LUNGS: clear to auscultation and percussion with normal breathing effort HEART: regular rate & rhythm and no murmurs and no lower extremity edema ABDOMEN:abdomen soft, non-tender and normal bowel sounds Musculoskeletal:no cyanosis of digits and no clubbing  NEURO: alert & oriented x 3 with fluent speech, no focal motor/sensory deficits  LABORATORY DATA:  I have reviewed the data as listed    Component Value Date/Time   NA 140 02/09/2019 1012   NA 143 02/06/2017 1302   K 4.5 02/09/2019 1012   K 4.8 02/06/2017 1302   CL 105 02/09/2019 1012   CO2 27  02/09/2019 1012   CO2 26 02/06/2017 1302   GLUCOSE 103 (H) 02/09/2019 1012   GLUCOSE 100 02/06/2017 1302   BUN 12 02/09/2019 1012   BUN 14.2 02/06/2017 1302   CREATININE 0.77 02/09/2019 1012   CREATININE 0.8 02/06/2017 1302   CALCIUM 9.4 02/09/2019 1012   CALCIUM 9.7 02/06/2017 1302   PROT 7.8 02/09/2019 1012   PROT 7.8 02/06/2017 1302   ALBUMIN 4.0 02/09/2019 1012   ALBUMIN 3.9 02/06/2017 1302   AST 17 02/09/2019 1012   AST 20 02/06/2017 1302   ALT 20 02/09/2019 1012   ALT 25 02/06/2017 1302   ALKPHOS 99 02/09/2019 1012   ALKPHOS 105 02/06/2017 1302   BILITOT 0.4 02/09/2019 1012   BILITOT 0.41 02/06/2017 1302   GFRNONAA >60 02/09/2019 1012   GFRAA >60 02/09/2019 1012    No results found for: SPEP, UPEP  Lab Results  Component Value Date   WBC 6.4 02/09/2019   NEUTROABS 4.1 02/09/2019   HGB 12.8 02/09/2019   HCT 40.2 02/09/2019   MCV 91.8 02/09/2019   PLT 217 02/09/2019      Chemistry      Component Value Date/Time   NA 140 02/09/2019 1012   NA 143 02/06/2017 1302   K 4.5 02/09/2019 1012   K 4.8 02/06/2017 1302   CL 105 02/09/2019 1012   CO2 27 02/09/2019 1012   CO2 26 02/06/2017 1302   BUN 12 02/09/2019 1012   BUN 14.2 02/06/2017 1302   CREATININE 0.77 02/09/2019 1012   CREATININE 0.8 02/06/2017 1302      Component Value Date/Time   CALCIUM 9.4 02/09/2019 1012   CALCIUM 9.7 02/06/2017 1302   ALKPHOS 99 02/09/2019 1012   ALKPHOS 105 02/06/2017 1302   AST 17 02/09/2019 1012   AST 20 02/06/2017 1302   ALT 20 02/09/2019 1012   ALT 25 02/06/2017 1302   BILITOT 0.4 02/09/2019 1012   BILITOT 0.41 02/06/2017 1302     I have reviewed her EKG which appeared unremarkable  All questions were answered. The patient knows to call the clinic with any problems, questions or concerns. No barriers to learning was detected.  I spent 25 minutes counseling the patient face to face. The total time spent in the appointment was 30 minutes and more than 50% was on  counseling and review of test results  Heath Lark, MD 02/10/2019 7:36 AM

## 2019-02-16 DIAGNOSIS — Z1231 Encounter for screening mammogram for malignant neoplasm of breast: Secondary | ICD-10-CM | POA: Diagnosis not present

## 2019-03-09 ENCOUNTER — Ambulatory Visit (INDEPENDENT_AMBULATORY_CARE_PROVIDER_SITE_OTHER): Payer: PPO

## 2019-03-09 ENCOUNTER — Ambulatory Visit (INDEPENDENT_AMBULATORY_CARE_PROVIDER_SITE_OTHER): Payer: PPO | Admitting: Orthopaedic Surgery

## 2019-03-09 ENCOUNTER — Encounter: Payer: Self-pay | Admitting: Orthopaedic Surgery

## 2019-03-09 VITALS — Ht 69.0 in | Wt 215.0 lb

## 2019-03-09 DIAGNOSIS — M79641 Pain in right hand: Secondary | ICD-10-CM | POA: Diagnosis not present

## 2019-03-09 MED ORDER — METHYLPREDNISOLONE 4 MG PO TABS: ORAL_TABLET | ORAL | 0 refills | Status: DC

## 2019-03-09 NOTE — Progress Notes (Signed)
Office Visit Note   Patient: [name]           Date of Birth: Apr 11, 1941           MRN: [id number] Visit Date: 03/09/2019              Requested by: Maurice Small, MD [place],  [place] [id number] PCP: Maurice Small, MD   Assessment & Plan: Visit Diagnoses:  1. Pain in right hand     Plan: We had a thorough discussion about her hand.  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  We see this in other joints all the time.  I would like to send her to hand therapy to see if there is any modalities that can help decrease her hand pain and improve her hand function.  I recommend she continues Voltaren gel in her hand I will try least a 6-day steroid taper.  We had a long and thorough discussion about her hand.  All question concerns were answered and addressed.  Follow-up as otherwise as needed.  I encouraged her to at least try some hand therapy.  I gave her prescription for this.  Follow-Up Instructions: Return if symptoms worsen or fail to improve.   Orders:  Orders Placed This Encounter  Procedures  . XR Hand Complete Right   Meds ordered this encounter  Medications  . methylPREDNISolone (MEDROL) 4 MG tablet    Sig: Medrol dose pack. Take as instructed    Dispense:  21 tablet    Refill:  0      Procedures: No procedures performed   Clinical Data: No additional findings.   Subjective: Chief Complaint  Patient presents with  . Right Hand - Pain    DOI 09/2018  The patient comes in today for evaluation treatment of right hand pain.  She right-hand-dominant.  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  She states she is had the inability to flex her middle and ring finger since then.   She points to the A1 pulley area as a source of pain but also pain in general her fingers.  She did go to the [facility] and she saw a physician there who is a hand specialist.  She said she did not gain any insight about what may be going on with her hand and did not go back.  She does have some Voltaren gel that she is used occasionally in her hand.  She was trying to spare this medication until she just found it recently is now over-the-counter.  She has had hand pain before but not to this extent after her fall.  She tried meloxicam but after 2 weeks of being on meloxicam she did not like the way that made her feel.  She is not a diabetic. HPI  Review of Systems She currently denies any headache, chest pain, shortness of breath, fever, chills, nausea, vomiting  Objective: Vital Signs: Ht 5\' 9"  (1.753 m)   Wt 215 lb (97.5 kg)   BMI 31.75 kg/m   Physical Exam She is alert and orient x3 and in no acute distress Ortho Exam Examination of her right hand shows no muscle  atrophy.  She has the inability to fully extend her PIP joints of the right hand of the third and fourth fingers.  She has obvious arthritic changes in all of her joints of her fingers and hand and she has problems making residual fist due to pain in her hand.  She is neurovascular intact otherwise.  All fingers are well-perfused with normal sensation. Specialty Comments:  No specialty comments available.  Imaging: Xr Hand Complete Right  Result Date: 03/09/2019 3 views of the right hand showed no acute findings.  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    PMFS History: Patient Active Problem List   Diagnosis Date Noted  . Skin rash 02/14/2016  . Left flank pain 02/14/2016  . Acute bronchitis 01/12/2016  . Hypersensitivity reaction 12/02/2015  . Essential hypertension 09/15/2015  . Lung infiltrate on CT 09/14/2015  . Pulmonary nodule 09/13/2015  . History of colon  cancer, stage I 08/31/2015  . 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) 08/31/2015  . H/O superficial parotidectomy 08/01/2015  . Other abnormal glucose 01/23/2013  . Left-sided headache 01/15/2013  . Chronic diarrhea 05/15/2012  . Bruising 01/25/2012  . Palpitations 11/12/2011  . Atypical chest pain 11/12/2011  . Preventative health care 05/15/2011  . CHEST PAIN UNSPECIFIED 02/02/2010  . HYPERTENSION 12/26/2009  . GERD 12/26/2009  . CONTACT DERMATITIS 09/02/2007  . DE QUERVAIN'S TENOSYNOVITIS 09/02/2007  . MENINGIOMA 07/22/2007  . DIVERTICULOSIS, COLON 07/22/2007  . FIBROMYALGIA 07/22/2007  . HYPERLIPIDEMIA 07/21/2007  . [place] SYNDROME 07/21/2007   Past Medical History:  Diagnosis Date  . Anxiety   . Arthritis    right thumb CMC arthritis  . Asthmatic bronchitis 2017  . Cancer ([place])    lymphoma  . Cataract    bilateral small  . Colon cancer ([place])    stage 1- at appendiceal orifice   . Complication of anesthesia    has dry mouth anyway from shograns-sore throats post op  . Depression   . Diverticulosis   . Fibromyalgia   . GERD (gastroesophageal reflux disease)   . Hyperlipidemia   . Hypertension   . Lung infiltrate on CT 09/14/2015  . Meningioma ([place]) 11/2009   right post parietal 59mm - stable 11/2009 MRI  . Nephrolithiasis   . Nephrolithiasis   . Neuromuscular disorder (HCC)    fibromyalgia  . 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 Sjogren's disease ([place])     Family History  Problem Relation Age of Onset  . Colon cancer Mother 54  . Colon cancer Father        unknown when onset was  . Diabetes Neg Hx   . Stomach cancer Neg Hx   . Colon polyps Neg Hx   . Esophageal cancer Neg Hx   . Rectal cancer Neg Hx     Past Surgical History:  Procedure Laterality Date  . ABDOMINAL HYSTERECTOMY    . APPENDECTOMY  2008  . ARTERY BIOPSY  02/03/2013   Procedure: MINOR BIOPSY TEMPORAL ARTERY;  Surgeon: Ascencion Dike, MD;   Location: [facility];  Service: ENT;;  . COLONOSCOPY    . [place]  . MASS EXCISION  2008   abd-benign  . PAROTIDECTOMY Right 08/01/2015   Procedure: RIGHT PAROTIDECTOMY;  Surgeon: [name], MD;  Location: [facility];  Service: ENT;  Laterality: Right;  . TONSILLECTOMY    .  VAGINAL HYSTERECTOMY  1989   Social History   Occupational History  . Occupation: Retired    Fish farm manager: UNEMPLOYED  Tobacco Use  . Smoking status: Never Smoker  . Smokeless tobacco: Never Used  . Tobacco comment: Passive exposure through her mother.  Substance and Sexual Activity  . Alcohol use: Yes    Alcohol/week: 0.0 standard drinks    Comment: Rarely  . Drug use: No  . Sexual activity: Not on file    Comment: [place] sister.

## 2019-03-31 DIAGNOSIS — L0109 Other impetigo: Secondary | ICD-10-CM | POA: Diagnosis not present

## 2019-03-31 DIAGNOSIS — Z85828 Personal history of other malignant neoplasm of skin: Secondary | ICD-10-CM | POA: Diagnosis not present

## 2019-03-31 DIAGNOSIS — L918 Other hypertrophic disorders of the skin: Secondary | ICD-10-CM | POA: Diagnosis not present

## 2019-03-31 DIAGNOSIS — L821 Other seborrheic keratosis: Secondary | ICD-10-CM | POA: Diagnosis not present

## 2019-03-31 DIAGNOSIS — D2239 Melanocytic nevi of other parts of face: Secondary | ICD-10-CM | POA: Diagnosis not present

## 2019-03-31 DIAGNOSIS — D485 Neoplasm of uncertain behavior of skin: Secondary | ICD-10-CM | POA: Diagnosis not present

## 2019-03-31 DIAGNOSIS — L814 Other melanin hyperpigmentation: Secondary | ICD-10-CM | POA: Diagnosis not present

## 2019-05-07 DIAGNOSIS — Z23 Encounter for immunization: Secondary | ICD-10-CM | POA: Diagnosis not present

## 2019-05-25 DIAGNOSIS — C8511 Unspecified B-cell lymphoma, lymph nodes of head, face, and neck: Secondary | ICD-10-CM | POA: Diagnosis not present

## 2019-05-25 DIAGNOSIS — R7303 Prediabetes: Secondary | ICD-10-CM | POA: Diagnosis not present

## 2019-05-25 DIAGNOSIS — I10 Essential (primary) hypertension: Secondary | ICD-10-CM | POA: Diagnosis not present

## 2019-05-25 DIAGNOSIS — E785 Hyperlipidemia, unspecified: Secondary | ICD-10-CM | POA: Diagnosis not present

## 2019-05-29 DIAGNOSIS — R7303 Prediabetes: Secondary | ICD-10-CM | POA: Diagnosis not present

## 2019-05-29 DIAGNOSIS — I10 Essential (primary) hypertension: Secondary | ICD-10-CM | POA: Diagnosis not present

## 2019-05-29 DIAGNOSIS — Z0000 Encounter for general adult medical examination without abnormal findings: Secondary | ICD-10-CM | POA: Diagnosis not present

## 2019-05-29 DIAGNOSIS — E785 Hyperlipidemia, unspecified: Secondary | ICD-10-CM | POA: Diagnosis not present

## 2019-06-10 DIAGNOSIS — D225 Melanocytic nevi of trunk: Secondary | ICD-10-CM | POA: Diagnosis not present

## 2019-06-10 DIAGNOSIS — D1801 Hemangioma of skin and subcutaneous tissue: Secondary | ICD-10-CM | POA: Diagnosis not present

## 2019-06-10 DIAGNOSIS — Z85828 Personal history of other malignant neoplasm of skin: Secondary | ICD-10-CM | POA: Diagnosis not present

## 2019-06-10 DIAGNOSIS — L821 Other seborrheic keratosis: Secondary | ICD-10-CM | POA: Diagnosis not present

## 2019-06-10 DIAGNOSIS — L820 Inflamed seborrheic keratosis: Secondary | ICD-10-CM | POA: Diagnosis not present

## 2019-06-10 DIAGNOSIS — L570 Actinic keratosis: Secondary | ICD-10-CM | POA: Diagnosis not present

## 2019-06-10 DIAGNOSIS — L814 Other melanin hyperpigmentation: Secondary | ICD-10-CM | POA: Diagnosis not present

## 2019-09-11 DIAGNOSIS — L659 Nonscarring hair loss, unspecified: Secondary | ICD-10-CM | POA: Diagnosis not present

## 2019-09-11 DIAGNOSIS — I10 Essential (primary) hypertension: Secondary | ICD-10-CM | POA: Diagnosis not present

## 2019-09-11 DIAGNOSIS — F419 Anxiety disorder, unspecified: Secondary | ICD-10-CM | POA: Insufficient documentation

## 2019-09-11 DIAGNOSIS — R7303 Prediabetes: Secondary | ICD-10-CM | POA: Diagnosis not present

## 2019-09-11 DIAGNOSIS — E785 Hyperlipidemia, unspecified: Secondary | ICD-10-CM | POA: Diagnosis not present

## 2019-09-11 DIAGNOSIS — Z780 Asymptomatic menopausal state: Secondary | ICD-10-CM | POA: Diagnosis not present

## 2019-09-11 DIAGNOSIS — E119 Type 2 diabetes mellitus without complications: Secondary | ICD-10-CM | POA: Diagnosis not present

## 2019-10-03 ENCOUNTER — Ambulatory Visit: Payer: PPO | Attending: Internal Medicine

## 2019-10-03 DIAGNOSIS — Z23 Encounter for immunization: Secondary | ICD-10-CM | POA: Insufficient documentation

## 2019-10-03 NOTE — Progress Notes (Signed)
   Covid-19 Vaccination Clinic  Name:  [name]    MRN: [id number] DOB: [birth year]  10/03/2019  [name] was observed post Covid-19 immunization for 15 minutes without incidence. She was provided with Vaccine Information Sheet and instruction to access the V-Safe system.   [name] was instructed to call 911 with any severe reactions post vaccine: Marland Kitchen Difficulty breathing  . Swelling of your face and throat  . A fast heartbeat  . A bad rash all over your body  . Dizziness and weakness    Immunizations Administered    Name Date Dose VIS Date Route   Pfizer COVID-19 Vaccine 10/03/2019  8:43 AM 0.3 mL 07/31/2019 Intramuscular   Manufacturer: [place]   Lot: [id number]   [place]: [id number]

## 2019-10-19 DIAGNOSIS — R011 Cardiac murmur, unspecified: Secondary | ICD-10-CM | POA: Insufficient documentation

## 2019-10-20 DIAGNOSIS — Z961 Presence of intraocular lens: Secondary | ICD-10-CM | POA: Diagnosis not present

## 2019-10-25 ENCOUNTER — Ambulatory Visit: Payer: PPO | Attending: Internal Medicine

## 2019-10-25 DIAGNOSIS — Z23 Encounter for immunization: Secondary | ICD-10-CM

## 2019-10-25 NOTE — Progress Notes (Signed)
   Covid-19 Vaccination Clinic  Name:  [name]    MRN: [id number] DOB: October 21, 1940  10/25/2019  [name] was observed post Covid-19 immunization for 15 minutes without incident. She was provided with Vaccine Information Sheet and instruction to access the V-Safe system.   [name] was instructed to call 911 with any severe reactions post vaccine: Marland Kitchen Difficulty breathing  . Swelling of face and throat  . A fast heartbeat  . A bad rash all over body  . Dizziness and weakness   Immunizations Administered    Name Date Dose VIS Date Route   Pfizer COVID-19 Vaccine 10/25/2019 12:51 PM 0.3 mL 07/31/2019 Intramuscular   Manufacturer: [place]   Lot: [id number]   [place]: [id number]

## 2019-11-13 ENCOUNTER — Other Ambulatory Visit: Payer: Self-pay

## 2019-11-13 ENCOUNTER — Encounter: Payer: Self-pay | Admitting: Cardiovascular Disease

## 2019-11-13 ENCOUNTER — Encounter (INDEPENDENT_AMBULATORY_CARE_PROVIDER_SITE_OTHER): Payer: Self-pay

## 2019-11-13 ENCOUNTER — Telehealth: Payer: Self-pay | Admitting: Cardiovascular Disease

## 2019-11-13 ENCOUNTER — Ambulatory Visit: Payer: PPO | Admitting: Cardiovascular Disease

## 2019-11-13 VITALS — BP 152/88 | HR 83 | Ht 69.0 in | Wt 216.0 lb

## 2019-11-13 DIAGNOSIS — E7800 Pure hypercholesterolemia, unspecified: Secondary | ICD-10-CM | POA: Diagnosis not present

## 2019-11-13 DIAGNOSIS — R011 Cardiac murmur, unspecified: Secondary | ICD-10-CM | POA: Diagnosis not present

## 2019-11-13 DIAGNOSIS — R079 Chest pain, unspecified: Secondary | ICD-10-CM

## 2019-11-13 DIAGNOSIS — I2510 Atherosclerotic heart disease of native coronary artery without angina pectoris: Secondary | ICD-10-CM

## 2019-11-13 DIAGNOSIS — I10 Essential (primary) hypertension: Secondary | ICD-10-CM | POA: Diagnosis not present

## 2019-11-13 MED ORDER — AMLODIPINE BESYLATE 5 MG PO TABS: 5.0000 mg | ORAL_TABLET | Freq: Every day | ORAL | 3 refills | Status: DC

## 2019-11-13 NOTE — Patient Instructions (Addendum)
Medication Instructions:  START AMLODIPINE 5 MG DAILY   START ASPIRIN 81 MG DAILY   *If you need a refill on your cardiac medications before your next appointment, please call your pharmacy*  Lab Work: FASTING LP/CMET/CBC FEW DAYS PRIOR TO CATH   YOU WILL NEED A COVID SCREENING TEST 3 DAYS PRIOR TO YOUR CATH, AFTERWARDS YOU WILL NEED TO QUARANTINE UNTIL YOU PROCEDURE   If you have labs (blood work) drawn today and your tests are completely normal, you will receive your results only by: Marland Kitchen MyChart Message (if you have MyChart) OR . A paper copy in the mail If you have any lab test that is abnormal or we need to change your treatment, we will call you to review the results.  Testing/Procedures: Your physician has requested that you have a cardiac catheterization. Cardiac catheterization is used to diagnose and/or treat various heart conditions. Doctors may recommend this procedure for a number of different reasons. The most common reason is to evaluate chest pain. Chest pain can be a symptom of coronary artery disease (CAD), and cardiac catheterization can show whether plaque is narrowing or blocking your heart's arteries. This procedure is also used to evaluate the valves, as well as measure the blood flow and oxygen levels in different parts of your heart. For further information please visit [email or web address]. Please follow instruction sheet, as given. CALL THE OFFICE AT [phone] WHEN YOU ARE READY TO SCHEDULE   Your physician has requested that you have an echocardiogram. Echocardiography is a painless test that uses sound waves to create images of your heart. It provides your doctor with information about the size and shape of your heart and how well your heart's chambers and valves are working. This procedure takes approximately one hour. There are no restrictions for this procedure. [place] [address]  Follow-Up: At [facility], you and your health needs  are our priority.  As part of our continuing mission to provide you with exceptional heart care, we have created designated Provider Care Teams.  These Care Teams include your primary Cardiologist (physician) and Advanced Practice Providers (APPs -  Physician Assistants and Nurse Practitioners) who all work together to provide you with the care you need, when you need it.  We recommend signing up for the patient portal called "MyChart".  Sign up information is provided on this After Visit Summary.  MyChart is used to connect with patients for Virtual Visits (Telemedicine).  Patients are able to view lab/test results, encounter notes, upcoming appointments, etc.  Non-urgent messages can be sent to your provider as well.   To learn more about what you can do with MyChart, go to [email or web address].    Your next appointment:   4 week(s)  The format for your next appointment:   In Person  Provider:   WITH PA/NP   Other Instructions   Echocardiogram An echocardiogram is a procedure that uses painless sound waves (ultrasound) to produce an image of the heart. Images from an echocardiogram can provide important information about:  Signs of coronary artery disease (CAD).  Aneurysm detection. An aneurysm is a weak or damaged part of an artery wall that bulges out from the normal force of blood pumping through the body.  Heart size and shape. Changes in the size or shape of the heart can be associated with certain conditions, including heart failure, aneurysm, and CAD.  Heart muscle function.  Heart valve function.  Signs of a past heart attack.  Fluid buildup around the heart.  Thickening of the heart muscle.  A tumor or infectious growth around the heart valves. Tell a health care provider about:  Any allergies you have.  All medicines you are taking, including vitamins, herbs, eye drops, creams, and over-the-counter medicines.  Any blood disorders you have.  Any surgeries  you have had.  Any medical conditions you have.  Whether you are pregnant or may be pregnant. What are the risks? Generally, this is a safe procedure. However, problems may occur, including:  Allergic reaction to dye (contrast) that may be used during the procedure. What happens before the procedure? No specific preparation is needed. You may eat and drink normally. What happens during the procedure?   An IV tube may be inserted into one of your veins.  You may receive contrast through this tube. A contrast is an injection that improves the quality of the pictures from your heart.  A gel will be applied to your chest.  A wand-like tool (transducer) will be moved over your chest. The gel will help to transmit the sound waves from the transducer.  The sound waves will harmlessly bounce off of your heart to allow the heart images to be captured in real-time motion. The images will be recorded on a computer. The procedure may vary among health care providers and hospitals. What happens after the procedure?  You may return to your normal, everyday life, including diet, activities, and medicines, unless your health care provider tells you not to do that. Summary  An echocardiogram is a procedure that uses painless sound waves (ultrasound) to produce an image of the heart.  Images from an echocardiogram can provide important information about the size and shape of your heart, heart muscle function, heart valve function, and fluid buildup around your heart.  You do not need to do anything to prepare before this procedure. You may eat and drink normally.  After the echocardiogram is completed, you may return to your normal, everyday life, unless your health care provider tells you not to do that. This information is not intended to replace advice given to you by your health care provider. Make sure you discuss any questions you have with your health care provider. Document Revised:  11/27/2018 Document Reviewed: 09/08/2016 Elsevier Patient Education  [place].

## 2019-11-13 NOTE — H&P (View-Only) (Signed)
Cardiology Office Note  Date:  11/14/2019   ID:  [name], DOB Jun 19, 1941, MRN [id number]  PCP:  Maurice Small, MD  Cardiologist:   [name], MD   No chief complaint on file.    History of Present Illness: [name] is a 79 y.o. female with coronary calcification, B-cell lymphoma, Sjogren's syndrome, meningioma, hypertension, hyperlipidemia, and GERD who is being seen today for the evaluation of chest pain at the request of Maurice Small, MD.  Ms. Laspisa notes that she has been under tremendous stress since 1982.  She subsequently developed Sjogren's syndrome. In the last seven years she has been feeling more stressed.  In the past walking seemed to help but she hasn't been doing that lately.  She has a niece who is mentally challend and causing her a lot of stress.  She has been able to lose weight in the last few weeks by limiting carbohydrates and eating less.  She hasn't been exercising.  In the last few years she has experienced a throbbing sensation in her chest.  It occurs intermittently and of went when she is reading.  It startles her and lasts for approximately 20 minutes.  There is no associated shortness of breath.  She does have some exertional dyspnea and felt difficulty walking into the [facility].  She denies nausea or diaphoresis.  She hasn't been getting any exercise lately.    She has also noted pain in her back behind her heart.  It seems to affect her ability to sleep.  She never gets this pain with exertion.  She feels her heart races when she lays down 1-2 times per week.  At home her BP has ranged 150s-170s/50-70s.  She previously tried losartan but stopped it 2/2 side effects.  She cooks her meals at home and doesn't use salt.  She denies lower extremity edema, orthopnea or PND.     Past Medical History:  Diagnosis Date  . Anxiety   . Arthritis    right thumb CMC arthritis  . Asthmatic bronchitis 2017  . Cancer ([place])    lymphoma  .  Cataract    bilateral small  . Colon cancer ([place])    stage 1- at appendiceal orifice   . Complication of anesthesia    has dry mouth anyway from shograns-sore throats post op  . Coronary artery calcification seen on CAT scan 11/14/2019  . Depression   . Diverticulosis   . Fibromyalgia   . GERD (gastroesophageal reflux disease)   . Hyperlipidemia   . Hypertension   . Lung infiltrate on CT 09/14/2015  . Meningioma ([place]) 11/2009   right post parietal 30mm - stable 11/2009 MRI  . Nephrolithiasis   . Nephrolithiasis   . Neuromuscular disorder (HCC)    fibromyalgia  . 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 Pure hypercholesterolemia 11/14/2019  . Sjogren's disease [facility])     Past Surgical History:  Procedure Laterality Date  . ABDOMINAL HYSTERECTOMY    . APPENDECTOMY  2008  . ARTERY BIOPSY  02/03/2013   Procedure: MINOR BIOPSY TEMPORAL ARTERY;  Surgeon: Ascencion Dike, MD;  Location: [facility];  Service: ENT;;  . COLONOSCOPY    . [place]  . MASS EXCISION  2008   abd-benign  . PAROTIDECTOMY Right 08/01/2015   Procedure: RIGHT PAROTIDECTOMY;  Surgeon: [name], MD;  Location: [facility];  Service: ENT;  Laterality: Right;  .  TONSILLECTOMY    . VAGINAL HYSTERECTOMY  1989     Current Outpatient Medications  Medication Sig Dispense Refill  . aspirin EC 81 MG tablet Take 81 mg by mouth daily.    . Calcium Carbonate-Vitamin D (CALCIUM + D) 600-200 MG-UNIT TABS Take 2 tablets by mouth daily.      . Cholecalciferol (D3 ADULT PO) Take by mouth.    . hydroxypropyl methylcellulose / hypromellose (ISOPTO TEARS / GONIOVISC) 2.5 % ophthalmic solution Place 1 drop into both eyes 3 (three) times daily as needed for dry eyes.    . methylPREDNISolone (MEDROL) 4 MG tablet Medrol dose pack. Take as instructed 21 tablet 0  . POTASSIUM CHLORIDE PO Take 1 tablet by mouth 3 (three) times daily. OTC    . simvastatin (ZOCOR) 10 MG tablet  Take 1 tablet (10 mg total) by mouth at bedtime. (Patient taking differently: Take 20 mg by mouth at bedtime. ) 90 tablet 1  . Specialty Vitamins Products (MAGNESIUM, AMINO ACID CHELATE,) 133 MG tablet Take 1 tablet by mouth daily.    . vitamin B-12 (CYANOCOBALAMIN) 1000 MCG tablet Take 1,000 mcg by mouth daily.    Marland Kitchen amLODipine (NORVASC) 5 MG tablet Take 1 tablet (5 mg total) by mouth daily. 90 tablet 3   Current Facility-Administered Medications  Medication Dose Route Frequency Provider Last Rate Last Admin  . 0.9 %  sodium chloride infusion  500 mL Intravenous Continuous Pyrtle, Lajuan Lines, MD        Allergies:   Losartan, Amoxicillin, Beta adrenergic blockers, and Clindamycin/lincomycin    Social History:  The patient  reports that she has never smoked. She has never used smokeless tobacco. She reports current alcohol use. She reports that she does not use drugs.   Family History:  The patient's family history includes Colon cancer in her father; Colon cancer (age of onset: 29) in her mother.    ROS:  Please see the history of present illness.   Otherwise, review of systems are positive for none.   All other systems are reviewed and negative.    PHYSICAL EXAM: VS:  BP (!) 152/88   Pulse 83   Ht 5\' 9"  (1.753 m)   Wt 216 lb (98 kg)   SpO2 95%   BMI 31.90 kg/m  , BMI Body mass index is 31.9 kg/m. GENERAL:  Well appearing HEENT:  Pupils equal round and reactive, fundi not visualized, oral mucosa unremarkable NECK:  No jugular venous distention, waveform within normal limits, carotid upstroke brisk and symmetric, no bruits LUNGS:  Clear to auscultation bilaterally HEART:  RRR.  PMI not displaced or sustained,S1 and S2 within normal limits, no S3, no S4, no clicks, no rubs, II/VI systolic murmur at the LUSB ABD:  Flat, positive bowel sounds normal in frequency in pitch, no bruits, no rebound, no guarding, no midline pulsatile mass, no hepatomegaly, no splenomegaly EXT:  2 plus pulses  throughout, no edema, no cyanosis no clubbing SKIN:  No rashes no nodules NEURO:  Cranial nerves II through XII grossly intact, motor grossly intact throughout PSYCH:  Cognitively intact, oriented to person place and time   EKG:  EKG is not ordered today. The ekg ordered 03/09/16 demonstrates sinus rhythm.  Rate 84  Bpm.  Low voltage limb leads. (personally reviewed)   Recent Labs: 02/09/2019: ALT 20; BUN 12; Creatinine, Ser 0.77; Hemoglobin 12.8; Platelets 217; Potassium 4.5; Sodium 140    Lipid Panel    Component Value Date/Time   CHOL  200 10/26/2013 1013   TRIG 201.0 (H) 10/26/2013 1013   HDL 44.80 10/26/2013 1013   CHOLHDL 4 10/26/2013 1013   VLDL 40.2 (H) 10/26/2013 1013   LDLCALC 115 (H) 10/26/2013 1013   LDLDIRECT 129.5 06/10/2013 0956    05/25/19: WBC 6.7, hgb 12.3, hct 36.2, plt 206 Sodium 143, potassium 4.7, BUN 11, creatinine 0.69 AST 14, ALT 16 Total cholesterol 185, triglycerides 185, HDL 47, LDL 102   Wt Readings from Last 3 Encounters:  11/13/19 216 lb (98 kg)  03/09/19 215 lb (97.5 kg)  02/09/19 217 lb 12.8 oz (98.8 kg)      ASSESSMENT AND PLAN:  # Chest pain: # Coronary calcification:  Ms. Telles has coronary and aortic calcifications on PET CT.  Her exertional symptoms are concerning for angina.  We will get a Lexiscan Myoview to better evaluate.  Given the coronary calcification, her LDL goal should be <70.  Start aspirin 81mg  daily.  Risks and benefits of cardiac catheterization have been discussed with the patient.  The patient understands that risks included but are not limited to stroke (1 in 1000), death (1 in 94), kidney failure [usually temporary] (1 in 500), bleeding (1 in 200), allergic reaction [possibly serious] (1 in 200). The patient understands and agrees to proceed.   # Hypertension: BP poorly controlled.  Start amlodipine 5mg  daily.    # Hyperlipidemia:  LDL 102 prior to starting simvastatin.  She will come for fasting lipids/CMP and  we will titrate as necessary.   # Murmur:  Systolic murmur noted on exam.  We will get an echo to better assess.    Current medicines are reviewed at length with the patient today.  The patient does not have concerns regarding medicines.  The following changes have been made:  Start amlodipine and aspirin  Labs/ tests ordered today include:  Orders Placed This Encounter  Procedures  . CBC with Differential/Platelet  . Lipid panel  . Comprehensive metabolic panel  . ECHOCARDIOGRAM COMPLETE     Disposition:   FU with Dessa Ledee C. Oval Linsey, MD, [facility] in 1 month    Signed, Stephinie Battisti C. Oval Linsey, MD, [facility]  11/14/2019 1:46 PM    [facility]

## 2019-11-13 NOTE — Telephone Encounter (Signed)
Left message for patient to call back  

## 2019-11-13 NOTE — Telephone Encounter (Signed)
Follow Up  Patient returning call. Transferred call to [facility]

## 2019-11-13 NOTE — Progress Notes (Signed)
Cardiology Office Note  Date:  11/14/2019   ID:  [name], DOB 09/10/1940, MRN [id number]  PCP:  Maurice Small, MD  Cardiologist:   [name], MD   No chief complaint on file.    History of Present Illness: [name] is a 79 y.o. female with coronary calcification, B-cell lymphoma, Sjogren's syndrome, meningioma, hypertension, hyperlipidemia, and GERD who is being seen today for the evaluation of chest pain at the request of Maurice Small, MD.  [name] notes that she has been under tremendous stress since 1982.  She subsequently developed Sjogren's syndrome. In the last seven years she has been feeling more stressed.  In the past walking seemed to help but she hasn't been doing that lately.  She has a niece who is mentally challend and causing her a lot of stress.  She has been able to lose weight in the last few weeks by limiting carbohydrates and eating less.  She hasn't been exercising.  In the last few years she has experienced a throbbing sensation in her chest.  It occurs intermittently and of went when she is reading.  It startles her and lasts for approximately 20 minutes.  There is no associated shortness of breath.  She does have some exertional dyspnea and felt difficulty walking into the [facility].  She denies nausea or diaphoresis.  She hasn't been getting any exercise lately.    She has also noted pain in her back behind her heart.  It seems to affect her ability to sleep.  She never gets this pain with exertion.  She feels her heart races when she lays down 1-2 times per week.  At home her BP has ranged 150s-170s/50-70s.  She previously tried losartan but stopped it 2/2 side effects.  She cooks her meals at home and doesn't use salt.  She denies lower extremity edema, orthopnea or PND.     Past Medical History:  Diagnosis Date  . Anxiety   . Arthritis    right thumb [facility] arthritis  . Asthmatic bronchitis 2017  . Cancer ([place])    lymphoma  .  Cataract    bilateral small  . 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 Complication of anesthesia    has dry mouth anyway from shograns-sore throats post op  . Coronary artery calcification seen on CAT scan 11/14/2019  . Depression   . Diverticulosis   . Fibromyalgia   . GERD (gastroesophageal reflux disease)   . Hyperlipidemia   . Hypertension   . Lung infiltrate on CT 09/14/2015  . Meningioma ([place]) 11/2009   right post parietal 69mm - stable 11/2009 MRI  . Nephrolithiasis   . Nephrolithiasis   . Neuromuscular disorder (H[facility])    fibromyalgia  . 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) 08/31/2015  . Pure hypercholesterolemia 11/14/2019  . Sjogren's disease [facility])     Past Surgical History:  Procedure Laterality Date  . ABDOMINAL HYSTERECTOMY    . APPENDECTOMY  2008  . ARTERY BIOPSY  02/03/2013   Procedure: MINOR BIOPSY TEMPORAL ARTERY;  Surgeon: Ascencion Dike, MD;  Location: [facility];  Service: ENT;;  . COLONOSCOPY    . [place]  . MASS EXCISION  2008   abd-benign  . PAROTIDECTOMY Right 08/01/2015   Procedure: RIGHT PAROTIDECTOMY;  Surgeon: [name], MD;  Location: [facility];  Service: ENT;  Laterality: Right;  .  TONSILLECTOMY    . VAGINAL HYSTERECTOMY  1989     Current Outpatient Medications  Medication Sig Dispense Refill  . aspirin EC 81 MG tablet Take 81 mg by mouth daily.    . Calcium Carbonate-Vitamin D (CALCIUM + D) 600-200 MG-UNIT TABS Take 2 tablets by mouth daily.      . Cholecalciferol (D3 ADULT PO) Take by mouth.    . hydroxypropyl methylcellulose / hypromellose (ISOPTO TEARS / GONIOVISC) 2.5 % ophthalmic solution Place 1 drop into both eyes 3 (three) times daily as needed for dry eyes.    . methylPREDNISolone (MEDROL) 4 MG tablet Medrol dose pack. Take as instructed 21 tablet 0  . POTASSIUM CHLORIDE PO Take 1 tablet by mouth 3 (three) times daily. OTC    . simvastatin (ZOCOR) 10 MG tablet  Take 1 tablet (10 mg total) by mouth at bedtime. (Patient taking differently: Take 20 mg by mouth at bedtime. ) 90 tablet 1  . Specialty Vitamins Products (MAGNESIUM, AMINO ACID CHELATE,) 133 MG tablet Take 1 tablet by mouth daily.    . vitamin B-12 (CYANOCOBALAMIN) 1000 MCG tablet Take 1,000 mcg by mouth daily.    Marland Kitchen amLODipine (NORVASC) 5 MG tablet Take 1 tablet (5 mg total) by mouth daily. 90 tablet 3   Current Facility-Administered Medications  Medication Dose Route Frequency Provider Last Rate Last Admin  . 0.9 %  sodium chloride infusion  500 mL Intravenous Continuous Pyrtle, Lajuan Lines, MD        Allergies:   Losartan, Amoxicillin, Beta adrenergic blockers, and Clindamycin/lincomycin    Social History:  The patient  reports that she has never smoked. She has never used smokeless tobacco. She reports current alcohol use. She reports that she does not use drugs.   Family History:  The patient's family history includes Colon cancer in her father; Colon cancer (age of onset: 31) in her mother.    ROS:  Please see the history of present illness.   Otherwise, review of systems are positive for none.   All other systems are reviewed and negative.    PHYSICAL EXAM: VS:  BP (!) 152/88   Pulse 83   Ht 5\' 9"  (1.753 m)   Wt 216 lb (98 kg)   SpO2 95%   BMI 31.90 kg/m  , BMI Body mass index is 31.9 kg/m. GENERAL:  Well appearing HEENT:  Pupils equal round and reactive, fundi not visualized, oral mucosa unremarkable NECK:  No jugular venous distention, waveform within normal limits, carotid upstroke brisk and symmetric, no bruits LUNGS:  Clear to auscultation bilaterally HEART:  RRR.  PMI not displaced or sustained,S1 and S2 within normal limits, no S3, no S4, no clicks, no rubs, II/VI systolic murmur at the LUSB ABD:  Flat, positive bowel sounds normal in frequency in pitch, no bruits, no rebound, no guarding, no midline pulsatile mass, no hepatomegaly, no splenomegaly EXT:  2 plus pulses  throughout, no edema, no cyanosis no clubbing SKIN:  No rashes no nodules NEURO:  Cranial nerves II through XII grossly intact, motor grossly intact throughout PSYCH:  Cognitively intact, oriented to person place and time   EKG:  EKG is not ordered today. The ekg ordered 03/09/16 demonstrates sinus rhythm.  Rate 84  Bpm.  Low voltage limb leads. (personally reviewed)   Recent Labs: 02/09/2019: ALT 20; BUN 12; Creatinine, Ser 0.77; Hemoglobin 12.8; Platelets 217; Potassium 4.5; Sodium 140    Lipid Panel    Component Value Date/Time   CHOL  200 10/26/2013 1013   TRIG 201.0 (H) 10/26/2013 1013   HDL 44.80 10/26/2013 1013   CHOLHDL 4 10/26/2013 1013   VLDL 40.2 (H) 10/26/2013 1013   LDLCALC 115 (H) 10/26/2013 1013   LDLDIRECT 129.5 06/10/2013 0956    05/25/19: WBC 6.7, hgb 12.3, hct 36.2, plt 206 Sodium 143, potassium 4.7, BUN 11, creatinine 0.69 AST 14, ALT 16 Total cholesterol 185, triglycerides 185, HDL 47, LDL 102   Wt Readings from Last 3 Encounters:  11/13/19 216 lb (98 kg)  03/09/19 215 lb (97.5 kg)  02/09/19 217 lb 12.8 oz (98.8 kg)      ASSESSMENT AND PLAN:  # Chest pain: # Coronary calcification:  [name] has coronary and aortic calcifications on PET CT.  Her exertional symptoms are concerning for angina.  We will get a Lexiscan Myoview to better evaluate.  Given the coronary calcification, her LDL goal should be <70.  Start aspirin 81mg  daily.  Risks and benefits of cardiac catheterization have been discussed with the patient.  The patient understands that risks included but are not limited to stroke (1 in 1000), death (1 in 35), kidney failure [usually temporary] (1 in 500), bleeding (1 in 200), allergic reaction [possibly serious] (1 in 200). The patient understands and agrees to proceed.   # Hypertension: BP poorly controlled.  Start amlodipine 5mg  daily.    # Hyperlipidemia:  LDL 102 prior to starting simvastatin.  She will come for fasting lipids/CMP and  we will titrate as necessary.   # Murmur:  Systolic murmur noted on exam.  We will get an echo to better assess.    Current medicines are reviewed at length with the patient today.  The patient does not have concerns regarding medicines.  The following changes have been made:  Start amlodipine and aspirin  Labs/ tests ordered today include:  Orders Placed This Encounter  Procedures  . CBC with Differential/Platelet  . Lipid panel  . Comprehensive metabolic panel  . ECHOCARDIOGRAM COMPLETE     Disposition:   FU with Taleen Prosser C. Oval Linsey, MD, [facility] in 1 month    Signed, Charmine Bockrath C. Oval Linsey, MD, [facility]  11/14/2019 1:46 PM    [facility]

## 2019-11-13 NOTE — Telephone Encounter (Signed)
Spoke with patient. Patient informed she can have the echo and heart cath in any order. Patient verbalized understanding.

## 2019-11-13 NOTE — Telephone Encounter (Signed)
   Pt would like to know if she needs to get her echo before or after heart cath.   Please call

## 2019-11-14 ENCOUNTER — Encounter: Payer: Self-pay | Admitting: Cardiovascular Disease

## 2019-11-14 ENCOUNTER — Other Ambulatory Visit: Payer: Self-pay | Admitting: Cardiovascular Disease

## 2019-11-14 DIAGNOSIS — I209 Angina pectoris, unspecified: Secondary | ICD-10-CM

## 2019-11-14 DIAGNOSIS — I2510 Atherosclerotic heart disease of native coronary artery without angina pectoris: Secondary | ICD-10-CM

## 2019-11-14 DIAGNOSIS — I2584 Coronary atherosclerosis due to calcified coronary lesion: Secondary | ICD-10-CM | POA: Insufficient documentation

## 2019-11-14 DIAGNOSIS — E7800 Pure hypercholesterolemia, unspecified: Secondary | ICD-10-CM

## 2019-11-14 HISTORY — DX: Pure hypercholesterolemia, unspecified: E78.00

## 2019-11-14 HISTORY — DX: Atherosclerotic heart disease of native coronary artery without angina pectoris: I25.10

## 2019-11-16 ENCOUNTER — Telehealth: Payer: Self-pay | Admitting: Cardiovascular Disease

## 2019-11-16 NOTE — Telephone Encounter (Signed)
Contacted patient- she states she is wanting to schedule the heart cath-  I advised with patient that I would send a message to Primary Nurse to get this scheduled, she was off today, but would return tomorrow-  She is wanting to reschedule her ECHO that is scheduled for the 13th of April, and schedule the CATH that day instead.  I advised I was unsure of which type of CATH was needed, and the nurse would know this information.  Patient verbalized understanding.

## 2019-11-16 NOTE — Telephone Encounter (Signed)
New message   Patient wants to discuss catheretization being December 01, 2019. Please call to discuss.

## 2019-11-17 NOTE — Telephone Encounter (Signed)
Patient called back. Transferred call to [facility]

## 2019-11-17 NOTE — Telephone Encounter (Signed)
Follow up  Pt returning call, she said she will be out pretty much all day today, she will leave her house at 10. She said if the nurse can call her before 10 to discuss her cath or later afternoon  Please advise

## 2019-11-17 NOTE — Telephone Encounter (Signed)
Patient schedule for cath 12/01/2019 arrive at 8:30 for 10:30 with Dr Claiborne Billings Advised patient, verbalized understanding

## 2019-11-23 ENCOUNTER — Telehealth: Payer: Self-pay | Admitting: Internal Medicine

## 2019-11-23 NOTE — Telephone Encounter (Signed)
She may schedule an office visit to discuss her options if she would like. Please schedule

## 2019-11-24 ENCOUNTER — Encounter: Payer: Self-pay | Admitting: *Deleted

## 2019-11-25 ENCOUNTER — Telehealth: Payer: Self-pay | Admitting: Cardiovascular Disease

## 2019-11-25 DIAGNOSIS — I10 Essential (primary) hypertension: Secondary | ICD-10-CM | POA: Diagnosis not present

## 2019-11-25 LAB — CBC WITH DIFFERENTIAL/PLATELET
Basophils Absolute: 0.1 10*3/uL (ref 0.0–0.2)
Basos: 1 %
EOS (ABSOLUTE): 0.8 10*3/uL — ABNORMAL HIGH (ref 0.0–0.4)
Eos: 12 %
Hematocrit: 36.8 % (ref 34.0–46.6)
Hemoglobin: 12.5 g/dL (ref 11.1–15.9)
Immature Grans (Abs): 0 10*3/uL (ref 0.0–0.1)
Immature Granulocytes: 0 %
Lymphocytes Absolute: 1.3 10*3/uL (ref 0.7–3.1)
Lymphs: 19 %
MCH: 29.6 pg (ref 26.6–33.0)
MCHC: 34 g/dL (ref 31.5–35.7)
MCV: 87 fL (ref 79–97)
Monocytes Absolute: 0.8 10*3/uL (ref 0.1–0.9)
Monocytes: 12 %
Neutrophils Absolute: 3.7 10*3/uL (ref 1.4–7.0)
Neutrophils: 56 %
Platelets: 225 10*3/uL (ref 150–450)
RBC: 4.22 x10E6/uL (ref 3.77–5.28)
RDW: 12.6 % (ref 11.7–15.4)
WBC: 6.5 10*3/uL (ref 3.4–10.8)

## 2019-11-25 LAB — COMPREHENSIVE METABOLIC PANEL
ALT: 19 IU/L (ref 0–32)
AST: 19 IU/L (ref 0–40)
Albumin/Globulin Ratio: 1.5 (ref 1.2–2.2)
Albumin: 4.3 g/dL (ref 3.7–4.7)
Alkaline Phosphatase: 116 IU/L (ref 39–117)
BUN/Creatinine Ratio: 23 (ref 12–28)
BUN: 14 mg/dL (ref 8–27)
Bilirubin Total: 0.3 mg/dL (ref 0.0–1.2)
CO2: 22 mmol/L (ref 20–29)
Calcium: 9.2 mg/dL (ref 8.7–10.3)
Chloride: 106 mmol/L (ref 96–106)
Creatinine, Ser: 0.61 mg/dL (ref 0.57–1.00)
GFR calc Af Amer: 100 mL/min/{1.73_m2} (ref >59)
GFR calc non Af Amer: 87 mL/min/{1.73_m2} (ref >59)
Globulin, Total: 2.8 g/dL (ref 1.5–4.5)
Glucose: 105 mg/dL — ABNORMAL HIGH (ref 65–99)
Potassium: 4.8 mmol/L (ref 3.5–5.2)
Sodium: 143 mmol/L (ref 134–144)
Total Protein: 7.1 g/dL (ref 6.0–8.5)

## 2019-11-25 LAB — LIPID PANEL
Chol/HDL Ratio: 3.6 ratio (ref 0.0–4.4)
Cholesterol, Total: 178 mg/dL (ref 100–199)
HDL: 50 mg/dL (ref >39)
LDL Chol Calc (NIH): 101 mg/dL — ABNORMAL HIGH (ref 0–99)
Triglycerides: 155 mg/dL — ABNORMAL HIGH (ref 0–149)
VLDL Cholesterol Cal: 27 mg/dL (ref 5–40)

## 2019-11-25 NOTE — Telephone Encounter (Signed)
She can have water.

## 2019-11-25 NOTE — Telephone Encounter (Signed)
Patient states that prior to procedure scheduled for 12/01/19 with Dr. Claiborne Billings she is not able to fast due to having Sjogren's Syndrome. Please call.

## 2019-11-25 NOTE — Telephone Encounter (Signed)
Left message to call back  

## 2019-11-26 NOTE — Telephone Encounter (Signed)
Pt advised and will only wet her mouth when needed.

## 2019-11-27 ENCOUNTER — Other Ambulatory Visit (HOSPITAL_COMMUNITY)
Admission: RE | Admit: 2019-11-27 | Discharge: 2019-11-27 | Disposition: A | Discharge status: Home / Self Care | Payer: PPO | Source: Ambulatory Visit | Attending: Cardiovascular Disease | Admitting: Cardiovascular Disease

## 2019-11-27 DIAGNOSIS — Z20822 Contact with and (suspected) exposure to covid-19: Secondary | ICD-10-CM | POA: Insufficient documentation

## 2019-11-27 DIAGNOSIS — Z01812 Encounter for preprocedural laboratory examination: Secondary | ICD-10-CM | POA: Insufficient documentation

## 2019-11-27 LAB — SARS CORONAVIRUS 2 (TAT 6-24 HRS): SARS Coronavirus 2: NEGATIVE

## 2019-11-30 ENCOUNTER — Telehealth: Payer: Self-pay

## 2019-11-30 ENCOUNTER — Telehealth: Payer: Self-pay | Admitting: *Deleted

## 2019-11-30 NOTE — Telephone Encounter (Signed)
Pt contacted pre-catheterization scheduled at [facility] for: Tuesday December 01, 2019 10:30 AM Verified arrival time and place: [place]) at: 8:30 AM   No solid food after midnight prior to cath, clear liquids until 5 AM day of procedure.  AM meds can be  taken pre-cath with sip of water including: ASA 81 mg   Confirmed patient has responsible adult to drive home post procedure and observe 24 hours after arriving home: yes  Currently, due to Covid-19 pandemic, only one person will be allowed with patient. Must be the same person for patient's entire stay and will be required to wear a mask. They will be asked to wait in the waiting room for the duration of the patient's stay.  Patients are required to wear a mask when they enter the hospital.      COVID-19 Pre-Screening Questions:  . In the past 7 to 10 days have you had a cough,  shortness of breath, headache, congestion, fever (100 or greater) body aches, chills, sore throat, or sudden loss of taste or sense of smell? no . Have you been around anyone with known Covid 19 in the past 7 to 10 days? no . Have you been around anyone who is awaiting Covid 19 test results in the past 7 to 10 days? no . Have you been around anyone who has mentioned symptoms of Covid 19 within the past 7 to 10 days? no  I reviewed procedure, mask, procedure instructions, COVID-19 screening questions with patient.

## 2019-11-30 NOTE — Telephone Encounter (Signed)
Spoke with patient, questions about procedure answered.

## 2019-11-30 NOTE — Telephone Encounter (Signed)
Pt calling stating that she has some questions and would like for you to give her a call back. Please address

## 2019-12-01 ENCOUNTER — Ambulatory Visit (HOSPITAL_COMMUNITY)
Admission: RE | Admit: 2019-12-01 | Discharge: 2019-12-01 | Disposition: A | Discharge status: Home / Self Care | Payer: PPO | Attending: Cardiovascular Disease | Admitting: Cardiovascular Disease

## 2019-12-01 ENCOUNTER — Encounter (HOSPITAL_COMMUNITY)
Admission: RE | Disposition: A | Discharge status: Home / Self Care | Payer: Self-pay | Source: Home / Self Care | Attending: Cardiovascular Disease

## 2019-12-01 ENCOUNTER — Other Ambulatory Visit: Payer: Self-pay

## 2019-12-01 ENCOUNTER — Other Ambulatory Visit (HOSPITAL_COMMUNITY): Payer: PPO

## 2019-12-01 DIAGNOSIS — I209 Angina pectoris, unspecified: Secondary | ICD-10-CM

## 2019-12-01 DIAGNOSIS — C8510 Unspecified B-cell lymphoma, unspecified site: Secondary | ICD-10-CM | POA: Diagnosis not present

## 2019-12-01 DIAGNOSIS — F329 Major depressive disorder, single episode, unspecified: Secondary | ICD-10-CM | POA: Diagnosis not present

## 2019-12-01 DIAGNOSIS — I2510 Atherosclerotic heart disease of native coronary artery without angina pectoris: Secondary | ICD-10-CM | POA: Diagnosis not present

## 2019-12-01 DIAGNOSIS — Z881 Allergy status to other antibiotic agents status: Secondary | ICD-10-CM | POA: Insufficient documentation

## 2019-12-01 DIAGNOSIS — K219 Gastro-esophageal reflux disease without esophagitis: Secondary | ICD-10-CM | POA: Insufficient documentation

## 2019-12-01 DIAGNOSIS — M3500 Sicca syndrome, unspecified: Secondary | ICD-10-CM | POA: Insufficient documentation

## 2019-12-01 DIAGNOSIS — Z880 Allergy status to penicillin: Secondary | ICD-10-CM | POA: Diagnosis not present

## 2019-12-01 DIAGNOSIS — I10 Essential (primary) hypertension: Secondary | ICD-10-CM | POA: Diagnosis not present

## 2019-12-01 DIAGNOSIS — M797 Fibromyalgia: Secondary | ICD-10-CM | POA: Insufficient documentation

## 2019-12-01 DIAGNOSIS — E7800 Pure hypercholesterolemia, unspecified: Secondary | ICD-10-CM | POA: Diagnosis not present

## 2019-12-01 DIAGNOSIS — R011 Cardiac murmur, unspecified: Secondary | ICD-10-CM | POA: Diagnosis not present

## 2019-12-01 DIAGNOSIS — Z79899 Other long term (current) drug therapy: Secondary | ICD-10-CM | POA: Insufficient documentation

## 2019-12-01 DIAGNOSIS — I2584 Coronary atherosclerosis due to calcified coronary lesion: Secondary | ICD-10-CM

## 2019-12-01 DIAGNOSIS — I25119 Atherosclerotic heart disease of native coronary artery with unspecified angina pectoris: Secondary | ICD-10-CM | POA: Insufficient documentation

## 2019-12-01 DIAGNOSIS — Z85038 Personal history of other malignant neoplasm of large intestine: Secondary | ICD-10-CM | POA: Insufficient documentation

## 2019-12-01 DIAGNOSIS — Z7982 Long term (current) use of aspirin: Secondary | ICD-10-CM | POA: Diagnosis not present

## 2019-12-01 DIAGNOSIS — E785 Hyperlipidemia, unspecified: Secondary | ICD-10-CM | POA: Diagnosis not present

## 2019-12-01 HISTORY — PX: LEFT HEART CATH AND CORONARY ANGIOGRAPHY: CATH118249

## 2019-12-01 SURGERY — LEFT HEART CATH AND CORONARY ANGIOGRAPHY
Anesthesia: LOCAL

## 2019-12-01 MED ORDER — VERAPAMIL HCL 2.5 MG/ML IV SOLN
INTRAVENOUS | Status: AC
  Filled 2019-12-01: qty 2

## 2019-12-01 MED ORDER — FENTANYL CITRATE (PF) 100 MCG/2ML IJ SOLN
INTRAMUSCULAR | Status: DC | PRN
  Administered 2019-12-01: 25 ug via INTRAVENOUS

## 2019-12-01 MED ORDER — VERAPAMIL HCL 2.5 MG/ML IV SOLN
INTRAVENOUS | Status: DC | PRN
  Administered 2019-12-01: 10 mL via INTRA_ARTERIAL

## 2019-12-01 MED ORDER — HEPARIN (PORCINE) IN NACL 1000-0.9 UT/500ML-% IV SOLN
INTRAVENOUS | Status: AC
  Filled 2019-12-01: qty 1000

## 2019-12-01 MED ORDER — HEPARIN SODIUM (PORCINE) 1000 UNIT/ML IJ SOLN
INTRAMUSCULAR | Status: AC
  Filled 2019-12-01: qty 1

## 2019-12-01 MED ORDER — HEPARIN (PORCINE) IN NACL 1000-0.9 UT/500ML-% IV SOLN
INTRAVENOUS | Status: DC | PRN
  Administered 2019-12-01 (×2): 500 mL

## 2019-12-01 MED ORDER — SODIUM CHLORIDE 0.9 % WEIGHT BASED INFUSION: 3.0000 mL/kg/h | INTRAVENOUS | Status: AC

## 2019-12-01 MED ORDER — MIDAZOLAM HCL 2 MG/2ML IJ SOLN
INTRAMUSCULAR | Status: AC
  Filled 2019-12-01: qty 2

## 2019-12-01 MED ORDER — FENTANYL CITRATE (PF) 100 MCG/2ML IJ SOLN
INTRAMUSCULAR | Status: AC
  Filled 2019-12-01: qty 2

## 2019-12-01 MED ORDER — MIDAZOLAM HCL 2 MG/2ML IJ SOLN
INTRAMUSCULAR | Status: DC | PRN
  Administered 2019-12-01: 2 mg via INTRAVENOUS

## 2019-12-01 MED ORDER — ASPIRIN 81 MG PO CHEW: 81.0000 mg | CHEWABLE_TABLET | ORAL | Status: DC

## 2019-12-01 MED ORDER — SODIUM CHLORIDE 0.9 % WEIGHT BASED INFUSION: 1.0000 mL/kg/h | INTRAVENOUS | Status: DC

## 2019-12-01 MED ORDER — IOHEXOL 350 MG/ML SOLN
INTRAVENOUS | Status: DC | PRN
  Administered 2019-12-01: 11:00:00 45 mL

## 2019-12-01 MED ORDER — SODIUM CHLORIDE 0.9 % IV SOLN: 250.0000 mL | INTRAVENOUS | Status: DC | PRN

## 2019-12-01 MED ORDER — SODIUM CHLORIDE 0.9% FLUSH: 3.0000 mL | INTRAVENOUS | Status: DC | PRN

## 2019-12-01 MED ORDER — HEPARIN SODIUM (PORCINE) 1000 UNIT/ML IJ SOLN
INTRAMUSCULAR | Status: DC | PRN
  Administered 2019-12-01: 5000 [IU] via INTRAVENOUS

## 2019-12-01 MED ORDER — LIDOCAINE HCL (PF) 1 % IJ SOLN
INTRAMUSCULAR | Status: AC
  Filled 2019-12-01: qty 30

## 2019-12-01 MED ORDER — SODIUM CHLORIDE 0.9% FLUSH: 3.0000 mL | Freq: Two times a day (BID) | INTRAVENOUS | Status: DC

## 2019-12-01 MED ORDER — LIDOCAINE HCL (PF) 1 % IJ SOLN
INTRAMUSCULAR | Status: DC | PRN
  Administered 2019-12-01: 2 mL

## 2019-12-01 SURGICAL SUPPLY — 10 items
CATH INFINITI JR4 5F (CATHETERS) ×1 IMPLANT
CATH OPTITORQUE TIG 4.0 5F (CATHETERS) ×1 IMPLANT
DEVICE RAD COMP TR BAND LRG (VASCULAR PRODUCTS) ×1 IMPLANT
GLIDESHEATH SLEND SS 6F .021 (SHEATH) ×1 IMPLANT
GUIDEWIRE INQWIRE 1.5J.035X260 (WIRE) IMPLANT
INQWIRE 1.5J .035X260CM (WIRE) ×2
KIT HEART LEFT (KITS) ×2 IMPLANT
PACK CARDIAC CATHETERIZATION (CUSTOM PROCEDURE TRAY) ×2 IMPLANT
TRANSDUCER W/STOPCOCK (MISCELLANEOUS) ×2 IMPLANT
TUBING CIL FLEX 10 FLL-RA (TUBING) ×2 IMPLANT

## 2019-12-01 NOTE — Discharge Instructions (Signed)
DRINK PLENTY OF FLUIDS FOR THE NEXT 2-3 DAYS.  KEEP ARM ELEVATED THE REMAINDER OF THE DAY.  Radial Site Care  This sheet gives you information about how to care for yourself after your procedure. Your health care provider may also give you more specific instructions. If you have problems or questions, contact your health care provider. What can I expect after the procedure? After the procedure, it is common to have:  Bruising and tenderness at the catheter insertion area. Follow these instructions at home: Medicines  Take over-the-counter and prescription medicines only as told by your health care provider. Insertion site care 1. Follow instructions from your health care provider about how to take care of your insertion site. Make sure you: ? Wash your hands with soap and water before you change your bandage (dressing). If soap and water are not available, use hand sanitizer. ? Change your dressing as told by your health care provider. 2. Check your insertion site every day for signs of infection. Check for: ? Redness, swelling, or pain. ? Fluid or blood. ? Pus or a bad smell. ? Warmth. 3. Do not take baths, swim, or use a hot tub for 5 days. 4. You may shower 24-48 hours after the procedure. ? Remove the dressing and gently wash the site with plain soap and water. ? Pat the area dry with a clean towel. ? Do not rub the site. That could cause bleeding. 5. Do not apply powder or lotion to the site. Activity  1. For 24 hours after the procedure, or as directed by your health care provider: ? Do not flex or bend the affected arm. ? Do not push or pull heavy objects with the affected arm. ? Do not drive yourself home from the hospital or clinic. You may drive 24 hours after the procedure. ? Do not operate machinery or power tools. 2. Do not push, pull or lift anything that is heavier than 10 lb for 5 days. 3. Ask your health care provider when it is okay to: ? Return to work or  school. ? Resume usual physical activities or sports. ? Resume sexual activity. General instructions  If the catheter site starts to bleed, raise your arm and put firm pressure on the site. If the bleeding does not stop, get help right away. This is a medical emergency.  If you went home on the same day as your procedure, a responsible adult should be with you for the first 24 hours after you arrive home.  Keep all follow-up visits as told by your health care provider. This is important. Contact a health care provider if:  You have a fever.  You have redness, swelling, or yellow drainage around your insertion site. Get help right away if:  You have unusual pain at the radial site.  The catheter insertion area swells very fast.  The insertion area is bleeding, and the bleeding does not stop when you hold steady pressure on the area.  Your arm or hand becomes pale, cool, tingly, or numb. These symptoms may represent a serious problem that is an emergency. Do not wait to see if the symptoms will go away. Get medical help right away. Call your local emergency services (911 in the [place] Do not drive yourself to the hospital. Summary  After the procedure, it is common to have bruising and tenderness at the site.  Follow instructions from your health care provider about how to take care of your radial site wound. Check   the wound every day for signs of infection.  Do not push, pull or lift anything that is heavier than 10 lb for 5 days.  This information is not intended to replace advice given to you by your health care provider. Make sure you discuss any questions you have with your health care provider. Document Revised: 09/11/2017 Document Reviewed: 09/11/2017 Elsevier Patient Education  2020 [employer] 

## 2019-12-01 NOTE — Interval H&P Note (Signed)
Cath Lab Visit (complete for each Cath Lab visit)  Clinical Evaluation Leading to the Procedure:   ACS: No.  Non-ACS:    Anginal Classification: CCS II  Anti-ischemic medical therapy: Minimal Therapy (1 class of medications)  Non-Invasive Test Results: No non-invasive testing performed  Prior CABG: No previous CABG      History and Physical Interval Note:  12/01/2019 10:38 AM  [name]  has presented today for surgery, with the diagnosis of chest pain.  The various methods of treatment have been discussed with the patient and family. After consideration of risks, benefits and other options for treatment, the patient has consented to  Procedure(s): LEFT HEART CATH AND CORONARY ANGIOGRAPHY (N/A) as a surgical intervention.  The patient's history has been reviewed, patient examined, no change in status, stable for surgery.  I have reviewed the patient's chart and labs.  Questions were answered to the patient's satisfaction.     Shelva Majestic

## 2019-12-08 ENCOUNTER — Ambulatory Visit (HOSPITAL_COMMUNITY): Payer: PPO | Attending: Cardiovascular Disease

## 2019-12-08 ENCOUNTER — Other Ambulatory Visit: Payer: Self-pay

## 2019-12-08 DIAGNOSIS — I10 Essential (primary) hypertension: Secondary | ICD-10-CM | POA: Insufficient documentation

## 2019-12-08 DIAGNOSIS — R011 Cardiac murmur, unspecified: Secondary | ICD-10-CM

## 2019-12-09 DIAGNOSIS — L905 Scar conditions and fibrosis of skin: Secondary | ICD-10-CM | POA: Diagnosis not present

## 2019-12-09 DIAGNOSIS — L570 Actinic keratosis: Secondary | ICD-10-CM | POA: Diagnosis not present

## 2019-12-09 DIAGNOSIS — Z85828 Personal history of other malignant neoplasm of skin: Secondary | ICD-10-CM | POA: Diagnosis not present

## 2019-12-09 DIAGNOSIS — L820 Inflamed seborrheic keratosis: Secondary | ICD-10-CM | POA: Diagnosis not present

## 2019-12-09 DIAGNOSIS — L249 Irritant contact dermatitis, unspecified cause: Secondary | ICD-10-CM | POA: Diagnosis not present

## 2019-12-29 ENCOUNTER — Encounter: Payer: Self-pay | Admitting: Internal Medicine

## 2020-01-07 ENCOUNTER — Telehealth: Payer: Self-pay | Admitting: Cardiovascular Disease

## 2020-01-07 NOTE — Telephone Encounter (Signed)
Pt c/o medication issue:  1. Name of Medication Amlodipine  2. How are you currently taking this medication (dosage and times per day)?  1 time a daay  3. Are you having a reaction (difficulty breathing--STAT)? no  4. What is your medication issue? stomach hurts, bowels changed and was weird, real shaky in her body- She stopped taking it on 01-01-20. The next day she felt better , none of these symptomsg

## 2020-01-08 NOTE — Telephone Encounter (Signed)
Pt calling today to report diarrhea, cramping, shaking and high heart rate last week. She believes it is a side effect of her Amlodipine. She states she began the Amlodipine in March and did not notice any GI symptoms until last week She stopped her Amlodipine on 5/14 and her symptoms resolved.   I advised her that her symptoms sounded viral, and since they began 2 months after beginning Amlodipine I was not sure that was the source. She will wait until she hears back from the office to begin back on her medication.  I will forward to Dr. Oval Linsey for advisement.

## 2020-01-11 NOTE — Telephone Encounter (Signed)
This is an uncommon side effect.  I agree that if it started two months after starting the medication it is less likely.  Please try it again and then if symptoms recur, we'll change to something else.

## 2020-01-12 NOTE — Telephone Encounter (Signed)
Left message to call back  

## 2020-01-12 NOTE — Telephone Encounter (Signed)
Advised patient, verbalized understanding  

## 2020-01-26 NOTE — Progress Notes (Signed)
Cardiology Clinic Note   Patient Name: [name] Date of Encounter: 01/27/2020  Primary Care Provider:  Maurice Small, MD Primary Cardiologist:  [name], MD  Patient Profile    [name] 79 year old female presents today for a follow-up status post cardiac catheterization 12/01/2019.  Past Medical History    Past Medical History:  Diagnosis Date  . Anxiety   . Arthritis    right thumb [facility] arthritis  . Asthmatic bronchitis 2017  . Cancer ([place])    lymphoma  . Cataract    bilateral small  . Colon cancer ([place])    stage 1- at appendiceal orifice   . Complication of anesthesia    has dry mouth anyway from shograns-sore throats post op  . Coronary artery calcification seen on CAT scan 11/14/2019  . Depression   . Diverticulosis   . Fibromyalgia   . GERD (gastroesophageal reflux disease)   . Hyperlipidemia   . Hypertension   . Lung infiltrate on CT 09/14/2015  . Meningioma ([place]) 11/2009   right post parietal 71mm - stable 11/2009 MRI  . Nephrolithiasis   . Nephrolithiasis   . Neuromuscular disorder (H[facility])    fibromyalgia  . 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 Pure hypercholesterolemia 11/14/2019  . Sjogren's disease [facility])    Past Surgical History:  Procedure Laterality Date  . ABDOMINAL HYSTERECTOMY    . APPENDECTOMY  2008  . ARTERY BIOPSY  02/03/2013   Procedure: MINOR BIOPSY TEMPORAL ARTERY;  Surgeon: Ascencion Dike, MD;  Location: [facility];  Service: ENT;;  . COLONOSCOPY    . [place]  . LEFT HEART CATH AND CORONARY ANGIOGRAPHY N/A 12/01/2019   Procedure: LEFT HEART CATH AND CORONARY ANGIOGRAPHY;  Surgeon: Troy Sine, MD;  Location: [facility];  Service: Cardiovascular;  Laterality: N/A;  . MASS EXCISION  2008   abd-benign  . PAROTIDECTOMY Right 08/01/2015   Procedure: RIGHT PAROTIDECTOMY;  Surgeon: [name], MD;  Location: [facility];  Service: ENT;  Laterality:  Right;  . TONSILLECTOMY    . VAGINAL HYSTERECTOMY  1989    Allergies  Allergies  Allergen Reactions  . Losartan Other (See Comments)    Causes somnolence  . Amoxicillin Rash  . Beta Adrenergic Blockers Diarrhea    History of Present Illness    Ms. Menta has a PMH of coronary calcification, B-cell lymphoma, Sjogren's syndrome, HTN, HLD, and GERD.  She was last seen by Dr. Oval Linsey on 11/13/2019.  She presented at the request of Dr. Maurice Small for further evaluation of her chest pain.  She indicated that she had been under an increased amount of stress since 1982.  She subsequently developed Sjogren's syndrome.  She also indicated that over the last 7 years she has been under increased stress due to her mentally challenged niece.  She was receiving some stress relief from walking however she had not exercised in quite some time.  She indicated that in the last few year she had experienced a throbbing sensation in her chest.  It would occur intermittently and last approximately 20 minutes.  It was not associated with shortness of breath.  She did note some exertional dyspnea previously as she walked into the [facility].  She denied diaphoresis and nausea.  She indicated that she had pain in her back behind her heart that was affecting her ability to sleep.  She never had pain with  exertion.  She also noticed her heart racing with laying down 1-2 times per week.  Her home blood pressures have ranged between 150s-170s/50s-70s.  She previously was prescribed losartan but stopped due to side effects.  She prepares her own meals at home and indicated that she did not use salt.  She denied lower extremity edema orthopnea and PND.  She underwent cardiac catheterization on 12/01/2019.  It showed a normal ejection fraction of 60-65%, and mid LAD 10% stenosis with no other significant CAD.  She presents the clinic today for follow-up evaluation and states she continues to be under a great deal of  stress.  She states that she is not physically active due to her Sjogren's disease but will try to increase her physical activity.  She has had 1 episode of chest pain in the evening that was at rest and dissipated after about 15 minutes with rest.  Her echocardiogram and cardiac catheterization results were reviewed.  She states that she did not receive an AVS at discharge.  Right radial cath site clean dry intact no drainage.  I will give her a blood pressure log, order LFTs and repeat lipid panel, and have her follow-up in 3 months.  Today she denies chest pain, shortness of breath, lower extremity edema, fatigue, palpitations, melena, hematuria, hemoptysis, diaphoresis, weakness, presyncope, syncope, orthopnea, and PND.   Home Medications    Prior to Admission medications   Medication Sig Start Date End Date Taking? Authorizing Provider  acetaminophen (TYLENOL) 500 MG tablet Take 500 mg by mouth every 6 (six) hours as needed for moderate pain.    [provider]  amLODipine (NORVASC) 5 MG tablet Take 1 tablet (5 mg total) by mouth daily. 11/13/19 02/11/20  [name], MD  aspirin EC 81 MG tablet Take 81 mg by mouth daily.    [provider]  Calcium Carbonate-Vitamin D (CALCIUM + D) 600-200 MG-UNIT TABS Take 2 tablets by mouth daily.      [provider]  Cholecalciferol (D3 ADULT) 25 MCG (1000 UT) CHEW Chew 1,000 Units by mouth daily.     [provider]  hydroxypropyl methylcellulose / hypromellose (ISOPTO TEARS / GONIOVISC) 2.5 % ophthalmic solution Place 1 drop into both eyes 3 (three) times daily as needed for dry eyes.    [provider]  POTASSIUM CHLORIDE PO Take 1 tablet by mouth 3 (three) times daily. OTC    [provider]  simvastatin (ZOCOR) 20 MG tablet Take 20 mg by mouth daily.    [provider]  Specialty Vitamins Products (MAGNESIUM, AMINO ACID CHELATE,) 133 MG tablet Take 1 tablet by mouth daily.    [provider]    Family History    Family History  Problem Relation Age of Onset  . Colon cancer Mother 70  . Colon cancer Father        unknown when onset was  . Diabetes Neg Hx   . Stomach cancer Neg Hx   . Colon polyps Neg Hx   . Esophageal cancer Neg Hx   . Rectal cancer Neg Hx    She indicated that her mother is deceased. She indicated that her father is deceased. She indicated that the status of her neg hx is unknown.  Social History    Social History   Socioeconomic History  . Marital status: Divorced    Spouse name: Not on file  . Number of children: 2  . Years of education: College  . Highest  education level: Not on file  Occupational History  . Occupation: Retired    Fish farm manager: UNEMPLOYED  Tobacco Use  . Smoking status: Never Smoker  . Smokeless tobacco: Never Used  . Tobacco comment: Passive exposure through her mother.  Substance and Sexual Activity  . Alcohol use: Yes    Alcohol/week: 0.0 standard drinks    Comment: Rarely  . Drug use: No  . Sexual activity: Not on file    Comment: [place] sister.  Other Topics Concern  . Not on file  Social History Narrative   Pt lives at home alone.   Caffeine Use: 2 mugs every other day.       [place] [facility]:   Originally from [place], [place]. She has also lived in [place], [place], & [place]. Internationally she has been to [place], [place], [place], & [place]. Previously has worked in Press photographer. No chemical or fume exposures. No pets currently. No bird, mold, or hot tub exposure. 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    Social Determinants of Health   Financial Resource Strain:   . Difficulty of Paying Living Expenses:   Food Insecurity:   . Worried About Charity fundraiser in the Last Year:   . Arboriculturist in the Last Year:   Transportation Needs:   . Film/video editor (Medical):   Marland Kitchen Lack of Transportation (Non-Medical):   Physical  Activity:   . Days of Exercise per Week:   . Minutes of Exercise per Session:   Stress:   . Feeling of Stress :   Social Connections:   . Frequency of Communication with Friends and Family:   . Frequency of Social Gatherings with Friends and Family:   . Attends Religious Services:   . Active Member of Clubs or Organizations:   . Attends Archivist Meetings:   Marland Kitchen Marital Status:   Intimate Partner Violence:   . Fear of Current or Ex-Partner:   . Emotionally Abused:   Marland Kitchen Physically Abused:   . Sexually Abused:      Review of Systems    General:  No chills, fever, night sweats or weight changes.  Cardiovascular:  No chest pain, dyspnea on exertion, edema, orthopnea, palpitations, paroxysmal nocturnal dyspnea. Dermatological: No rash, lesions/masses Respiratory: No cough, dyspnea Urologic: No hematuria, dysuria Abdominal:   No nausea, vomiting, diarrhea, bright red blood per rectum, melena, or hematemesis Neurologic:  No visual changes, wkns, changes in mental status. All other systems reviewed and are otherwise negative except as noted above.  Physical Exam    VS:  BP 132/64   Pulse 92   Temp (!) 97.4 F (36.3 C)   Ht 5\' 9"  (1.753 m)   Wt 215 lb 3.2 oz (97.6 kg)   SpO2 95%   BMI 31.78 kg/m  , BMI Body mass index is 31.78 kg/m. GEN: Well nourished, well developed, in no acute distress. HEENT: normal. Neck: Supple, no JVD, carotid bruits, or masses. Cardiac: RRR, no murmurs, rubs, or gallops. No clubbing, cyanosis, edema.  Radials/DP/PT 2+ and equal bilaterally.  Respiratory:  Respirations regular and unlabored, clear to auscultation bilaterally. GI: Soft, nontender, nondistended, BS + x 4. MS: no deformity or atrophy. Skin: warm and dry, no rash. Neuro:  Strength and sensation are intact. Psych: Normal affect.  Accessory Clinical Findings    ECG personally reviewed by me today-none today.   EKG 12/01/2019 Normal sinus rhythm 77 bpm  Echocardiogram  12/08/2019 IMPRESSIONS    1. Left ventricular ejection fraction, by estimation, is 70 to 75%. The  left ventricle has hyperdynamic function. The left ventricle has no  regional wall motion abnormalities. Left ventricular diastolic parameters  are consistent with age-related delayed  relaxation (normal).  2. Right ventricular systolic function is normal. The right ventricular  size is normal. There is normal pulmonary artery systolic pressure. The  estimated right ventricular systolic pressure is 38.3 mmHg.  3. The mitral valve is grossly normal. Trivial mitral valve  regurgitation. No evidence of mitral stenosis.  4. The aortic valve is tricuspid. Aortic valve regurgitation is not  visualized. Mild aortic valve sclerosis is present, with no evidence of  aortic valve stenosis.  5. The inferior vena cava is normal in size with greater than 50%  respiratory variability, suggesting right atrial pressure of 3 mmHg.   Cardiac catheterization 12/01/2019  Mid LAD lesion is 10% stenosed.   No significant coronary obstructive disease in this patient with very short left main that immediately bifurcates into a large LAD and a large dominant left circumflex coronary artery.  The midportion of the LAD dips slightly intramyocardially and has mild irregularity of 10% in this intramyocardial segment.  There was no evidence gross systolic muscle bridging.  The dominant circumflex coronary artery is angiographically normal as is the small nondominant RCA.  Normal LV function with EF estimate at 60 to 65%.  LVEDP 13 mmHg.  RECOMMENDATION: Medical therapy.  Assessment & Plan   1.  Chest pain/CAD-no chest pain today.  Cardiac catheterization 12/01/19 showed 10% mid LAD lesion, and normal EF. Continue aspirin, amlodipine, simvastatin Heart healthy low-sodium diet Increase physical activity as tolerated  Essential hypertension-BP today 132/64 Continue amlodipine Heart healthy low-sodium  diet-salty 6 given Increase physical activity as tolerated B/p log given  Hyperlipidemia-LDL 102 before starting simvastatin Continue simvastatin Heart healthy low-sodium high-fiber diet Increase physical activity as tolerated Order fasting lipid panel and LFTs.  Murmur-identified on prior cardiac exam.  Echocardiogram 12/08/2019 showed LVEF 70-75%, and mild aortic valve sclerosis Continue to monitor  Disposition: Follow-up with Dr. Oval Linsey in 3 months.  Jossie Ng. Johneric Mcfadden NP-C    01/27/2020, 9:08 AM [address] Office [phone] Fax [phone]

## 2020-01-27 ENCOUNTER — Ambulatory Visit: Payer: PPO | Admitting: General Practice

## 2020-01-27 ENCOUNTER — Encounter: Payer: Self-pay | Admitting: General Practice

## 2020-01-27 ENCOUNTER — Other Ambulatory Visit: Payer: Self-pay

## 2020-01-27 VITALS — BP 132/64 | HR 92 | Temp 97.4°F | Ht 69.0 in | Wt 215.2 lb

## 2020-01-27 DIAGNOSIS — Z79899 Other long term (current) drug therapy: Secondary | ICD-10-CM | POA: Diagnosis not present

## 2020-01-27 DIAGNOSIS — R011 Cardiac murmur, unspecified: Secondary | ICD-10-CM | POA: Diagnosis not present

## 2020-01-27 DIAGNOSIS — E7800 Pure hypercholesterolemia, unspecified: Secondary | ICD-10-CM | POA: Diagnosis not present

## 2020-01-27 DIAGNOSIS — I209 Angina pectoris, unspecified: Secondary | ICD-10-CM | POA: Diagnosis not present

## 2020-01-27 DIAGNOSIS — I10 Essential (primary) hypertension: Secondary | ICD-10-CM | POA: Diagnosis not present

## 2020-01-27 NOTE — Patient Instructions (Signed)
Medication Instructions:  The current medical regimen is effective;  continue present plan and medications as directed. Please refer to the Current Medication list given to you today. *If you need a refill on your cardiac medications before your next appointment, please call your pharmacy*  Lab Work: TOMORROW FASTING LIPID AND LFT If you have labs (blood work) drawn today and your tests are completely normal, you will receive your results only by:  [facility] (if you have MyChart) OR A paper copy in the mail.  If you have any lab test that is abnormal or we need to change your treatment, we will call you to review the results. You may go to any [facility] that is convenient for you however, we do have a lab in our office that is able to assist you. You DO NOT need an appointment for our lab. The lab is open 8:00am and closes at 4:00pm. Lunch 12:45 - 1:45pm.  Special Instructions TAKE AND LOG YOUR BLOOD PRESSURE WEEKLY  PLEASE READ AND FOLLOW SALTY 6-ATTACHED  Follow-Up: Your next appointment:  3 month(s) In Person with Coletta Memos, FNP  At [facility], you and your health needs are our priority.  As part of our continuing mission to provide you with exceptional heart care, we have created designated Provider Care Teams.  These Care Teams include your primary Cardiologist (physician) and Advanced Practice Providers (APPs -  Physician Assistants and Nurse Practitioners) who all work together to provide you with the care you need, when you need it.  We recommend signing up for the patient portal called "MyChart".  Sign up information is provided on this After Visit Summary.  MyChart is used to connect with patients for Virtual Visits (Telemedicine).  Patients are able to view lab/test results, encounter notes, upcoming appointments, etc.  Non-urgent messages can be sent to your provider as well.   To learn more about what you can do with MyChart, go to [email or web address].

## 2020-01-28 DIAGNOSIS — E7800 Pure hypercholesterolemia, unspecified: Secondary | ICD-10-CM | POA: Diagnosis not present

## 2020-01-28 DIAGNOSIS — I209 Angina pectoris, unspecified: Secondary | ICD-10-CM | POA: Diagnosis not present

## 2020-01-28 DIAGNOSIS — Z79899 Other long term (current) drug therapy: Secondary | ICD-10-CM | POA: Diagnosis not present

## 2020-01-28 DIAGNOSIS — R011 Cardiac murmur, unspecified: Secondary | ICD-10-CM | POA: Diagnosis not present

## 2020-01-28 DIAGNOSIS — I10 Essential (primary) hypertension: Secondary | ICD-10-CM | POA: Diagnosis not present

## 2020-01-28 LAB — HEPATIC FUNCTION PANEL
ALT: 16 IU/L (ref 0–32)
AST: 20 IU/L (ref 0–40)
Albumin: 4.5 g/dL (ref 3.7–4.7)
Alkaline Phosphatase: 108 IU/L (ref 48–121)
Bilirubin Total: 0.3 mg/dL (ref 0.0–1.2)
Bilirubin, Direct: 0.1 mg/dL (ref 0.00–0.40)
Total Protein: 7.2 g/dL (ref 6.0–8.5)

## 2020-01-28 LAB — LIPID PANEL
Chol/HDL Ratio: 3.6 ratio (ref 0.0–4.4)
Cholesterol, Total: 195 mg/dL (ref 100–199)
HDL: 54 mg/dL (ref >39)
LDL Chol Calc (NIH): 117 mg/dL — ABNORMAL HIGH (ref 0–99)
Triglycerides: 133 mg/dL (ref 0–149)
VLDL Cholesterol Cal: 24 mg/dL (ref 5–40)

## 2020-02-01 ENCOUNTER — Other Ambulatory Visit: Payer: Self-pay

## 2020-02-01 MED ORDER — SIMVASTATIN 40 MG PO TABS: 40.0000 mg | ORAL_TABLET | Freq: Every day | ORAL | 6 refills | Status: DC

## 2020-02-05 ENCOUNTER — Telehealth: Payer: Self-pay | Admitting: *Deleted

## 2020-02-05 NOTE — Telephone Encounter (Signed)
Dr. Hilarie Fredrickson and Jenny Reichmann,  This pt is having a PV on 02-17-20 and her colonoscopy is 03-04-20.  She had a heart cath on 12-01-19.  She has since followed up with cardiology and is d/t to see them in 3 months for a follow up.  Just wanted you to be aware of this and make sure she is ok for a direct to [facility].  Thanks, [employer]

## 2020-02-05 NOTE — Telephone Encounter (Signed)
[name],  I have reviewed this pt's chart and she is 100% A-OK to have her procedure at [facility].  Thanks,  Osvaldo Angst

## 2020-02-05 NOTE — Telephone Encounter (Signed)
Thank you. Noted.

## 2020-02-09 ENCOUNTER — Other Ambulatory Visit: Payer: Self-pay

## 2020-02-09 ENCOUNTER — Inpatient Hospital Stay: Payer: PPO

## 2020-02-09 ENCOUNTER — Inpatient Hospital Stay: Payer: PPO | Attending: Hematology and Oncology | Admitting: Hematology and Oncology

## 2020-02-09 ENCOUNTER — Encounter: Payer: Self-pay | Admitting: Hematology and Oncology

## 2020-02-09 ENCOUNTER — Telehealth: Payer: Self-pay | Admitting: Internal Medicine

## 2020-02-09 DIAGNOSIS — Z85038 Personal history of other malignant neoplasm of large intestine: Secondary | ICD-10-CM | POA: Diagnosis not present

## 2020-02-09 DIAGNOSIS — I10 Essential (primary) hypertension: Secondary | ICD-10-CM | POA: Diagnosis not present

## 2020-02-09 DIAGNOSIS — R918 Other nonspecific abnormal finding of lung field: Secondary | ICD-10-CM

## 2020-02-09 DIAGNOSIS — Z8572 Personal history of non-Hodgkin lymphomas: Secondary | ICD-10-CM | POA: Diagnosis not present

## 2020-02-09 DIAGNOSIS — Z79899 Other long term (current) drug therapy: Secondary | ICD-10-CM | POA: Insufficient documentation

## 2020-02-09 DIAGNOSIS — C8588 Other specified types of non-Hodgkin lymphoma, lymph nodes of multiple sites: Secondary | ICD-10-CM

## 2020-02-09 DIAGNOSIS — Z9221 Personal history of antineoplastic chemotherapy: Secondary | ICD-10-CM | POA: Diagnosis not present

## 2020-02-09 DIAGNOSIS — Z7982 Long term (current) use of aspirin: Secondary | ICD-10-CM | POA: Diagnosis not present

## 2020-02-09 DIAGNOSIS — R6889 Other general symptoms and signs: Secondary | ICD-10-CM | POA: Diagnosis not present

## 2020-02-09 LAB — CBC WITH DIFFERENTIAL/PLATELET
Abs Immature Granulocytes: 0.01 10*3/uL (ref 0.00–0.07)
Basophils Absolute: 0 10*3/uL (ref 0.0–0.1)
Basophils Relative: 1 %
Eosinophils Absolute: 0.3 10*3/uL (ref 0.0–0.5)
Eosinophils Relative: 4 %
HCT: 38 % (ref 36.0–46.0)
Hemoglobin: 12.5 g/dL (ref 12.0–15.0)
Immature Granulocytes: 0 %
Lymphocytes Relative: 16 %
Lymphs Abs: 1 10*3/uL (ref 0.7–4.0)
MCH: 29.3 pg (ref 26.0–34.0)
MCHC: 32.9 g/dL (ref 30.0–36.0)
MCV: 89 fL (ref 80.0–100.0)
Monocytes Absolute: 0.7 10*3/uL (ref 0.1–1.0)
Monocytes Relative: 10 %
Neutro Abs: 4.6 10*3/uL (ref 1.7–7.7)
Neutrophils Relative %: 69 %
Platelets: 229 10*3/uL (ref 150–400)
RBC: 4.27 MIL/uL (ref 3.87–5.11)
RDW: 12.5 % (ref 11.5–15.5)
WBC: 6.6 10*3/uL (ref 4.0–10.5)
nRBC: 0 % (ref 0.0–0.2)

## 2020-02-09 LAB — CEA (IN HOUSE-CHCC): CEA (CHCC-In House): <1 ng/mL (ref 0.00–5.00)

## 2020-02-09 LAB — COMPREHENSIVE METABOLIC PANEL
ALT: 18 U/L (ref 0–44)
AST: 16 U/L (ref 15–41)
Albumin: 3.9 g/dL (ref 3.5–5.0)
Alkaline Phosphatase: 103 U/L (ref 38–126)
Anion gap: 9 (ref 5–15)
BUN: 13 mg/dL (ref 8–23)
CO2: 25 mmol/L (ref 22–32)
Calcium: 9.5 mg/dL (ref 8.9–10.3)
Chloride: 107 mmol/L (ref 98–111)
Creatinine, Ser: 0.78 mg/dL (ref 0.44–1.00)
GFR calc Af Amer: >60 mL/min (ref >60)
GFR calc non Af Amer: >60 mL/min (ref >60)
Glucose, Bld: 104 mg/dL — ABNORMAL HIGH (ref 70–99)
Potassium: 4.2 mmol/L (ref 3.5–5.1)
Sodium: 141 mmol/L (ref 135–145)
Total Bilirubin: 0.5 mg/dL (ref 0.3–1.2)
Total Protein: 7.8 g/dL (ref 6.5–8.1)

## 2020-02-09 NOTE — Assessment & Plan Note (Signed)
She had pulmonary infiltrate noted that could be related to Sjogren's She has not seen a pulmonologist for some time She has some nonspecific chest wall pain on the back Her exam is quite benign Previously, I have reviewed the PET CT scan with her extensively which show no abnormalities in that region After extensive discussion, I recommend pulmonary referral and she is in agreement to proceed

## 2020-02-09 NOTE — Progress Notes (Signed)
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 [facility]) Clinically, she has no signs of cancer recurrence There is no benefit for routine surveillance imaging study Her last PET CT scan from 2019 showed no evidence of cancer recurrence I will continue to see her once a year  History of colon cancer, stage I She has no signs or symptoms to suggest cancer recurrence Her tumor marker is normal  Essential hypertension She is reluctant to take amlodipine due to leg swelling Her BP is high I suggest she sees her primary doctor for management  Lung infiltrate on CT She had pulmonary infiltrate noted that could be related to Sjogren's She has not seen a pulmonologist for some time She has some nonspecific chest wall pain on the back Her exam is quite benign Previously, I have reviewed the PET CT scan with her extensively which show no abnormalities in that region After extensive discussion, I recommend pulmonary referral and she is in agreement to proceed  Multiple complaints The patient shared with me a lot of other issues in regards to stress, difficulties with different physicians practices, her social isolation of having had to spend Christmas by herself, excessive fatigue, chronic dry mouth, chronic dry eyes etc.  I spoke with her primary care doctor today and she will try to see the patient soon to address or her concerns   Orders Placed This Encounter  Procedures  . Ambulatory referral to Pulmonology    Referral Priority:   Routine     Referral Type:   Consultation    Referral Reason:   Specialty Services Required    Requested Specialty:   Pulmonary Disease    Number of Visits Requested:   1    All questions were answered. The patient knows to call the clinic with any problems, questions or concerns. The total time spent in the appointment was 30 minutes encounter with patients including review of chart and various tests results, discussions about plan of care and coordination of care plan   Heath Lark, MD 02/09/2020 10:56 AM  INTERVAL HISTORY: Please see below for problem oriented charting. She returns for her yearly follow-up for lymphoma and remote history of colon cancer The patient has history of Sjogren's syndrome but does not follow with rheumatologist due to unpleasant interaction in the past She also follows with pulmonologist for abnormal CT imaging but after her physician left, she was not happy with the assigned physician and has not been seen She has nonspecific chest wall discomfort that has been going on for 5 years Previously, we reviewed imaging study of her back which showed no abnormalities but it is bothering her more She is aching all over, she feels bad She has excessive fatigue She had recent coronary angiogram with follow-up with cardiologist.  She was dissatisfied with the aftercare provided with no instruction of how to take care of her arm after the catheterization.  She saw her nurse practitioner soon after that with unclear instructions about her medications.  She does not want to go back to see that practice again She noted her leg was swollen and went  online and researched amlodipine can cause leg swelling She stopped taking amlodipine recently.  Her blood pressure at home has been high, consistently with systolic blood pressure in the 170 Her blood pressure in my office is elevated The patient continues to share with me to stressful situation by her being lonely for Christmas and in an  strange relationship with her sister and her niece She continues to share with me her sadness over the suicide of her son and multiple other family related issues. 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 Please refer to report for other findings   11/10/2015 Procedure   she underwent CT guided biopsy   11/10/2015 Pathology Results   Accession: [id number] right lung CT-guided biopsy suspicious for lymphoma   12/02/2015 - 12/23/2015 Chemotherapy   She received 4 doses of weekly rituximab.   02/10/2016 PET scan   There is no evidence for residual or recurrent hypermetabolic adenopathy   12/20/7739 Imaging   The masslike area of concern in the medial right lung base seen on the previous study is unchanged in size since March 2017. A biopsy was previously recommended after the CT scan from October 26, 2015. Please see that report for further recommendations as the finding was better characterized at that time. 2. No pulmonary emboli. 3. Multiple thin walled cysts in the mid and lower lungs is stable. 4. 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   02/06/2017 PET scan   No evidence of active lymphomatous involvement. Stable 1.5 cm mixed cystic/ solid nodule in the right lower lobe, non FDG avid. Continued attention on follow-up is suggested.   02/13/2017 Procedure   Colonoscopy - One 8 mm polyp in the cecum, removed with a cold snare. Resected and retrieved. - Two 3 to 4 mm polyps in the transverse colon, removed with a cold biopsy  forceps. Resected and retrieved. - One 5 mm polyp in the descending colon, removed with a cold snare. Resected and retrieved. - Two 3 to 5 mm polyps at the recto-sigmoid colon, removed with a cold snare. Resected and retrieved. - Mild diverticulosis in the sigmoid colon. - Internal hemorrhoids.      02/06/2018 PET scan   Negative PET-CT. No findings for enlarged or hypermetabolic lymph nodes to suggest lymphoma.     REVIEW OF SYSTEMS:   Constitutional: Denies fevers, chills or abnormal weight loss Eyes: Denies blurriness of vision Ears, nose, mouth, throat, and face: Denies mucositis or sore throat Respiratory: Denies cough, dyspnea or wheezes Cardiovascular: Denies palpitation, chest discomfort  Gastrointestinal:  Denies nausea, heartburn or change in bowel habits Skin: Denies abnormal skin rashes Lymphatics: Denies new lymphadenopathy or easy bruising Neurological:Denies numbness, tingling or new weaknesses Behavioral/Psych: Mood is stable, no new changes  All other systems were reviewed with the patient and are negative.  I have reviewed the past medical history, past surgical history, social history and family history with the patient and they are unchanged from previous note.  ALLERGIES:  is allergic to losartan, amoxicillin, and  beta adrenergic blockers.  MEDICATIONS:  Current Outpatient Medications  Medication Sig Dispense Refill  . acetaminophen (TYLENOL) 500 MG tablet Take 500 mg by mouth every 6 (six) hours as needed for moderate pain.    Marland Kitchen amLODipine (NORVASC) 5 MG tablet Take 1 tablet (5 mg total) by mouth daily. 90 tablet 3  . aspirin EC 81 MG tablet Take 81 mg by mouth daily.    . Calcium Carbonate-Vitamin D (CALCIUM + D) 600-200 MG-UNIT TABS Take 2 tablets by mouth daily.      . Cholecalciferol (D3 ADULT) 25 MCG (1000 UT) CHEW Chew 1,000 Units by mouth daily.     . hydroxypropyl methylcellulose / hypromellose (ISOPTO TEARS / GONIOVISC) 2.5 % ophthalmic solution Place  1 drop into both eyes 3 (three) times daily as needed for dry eyes.    Marland Kitchen POTASSIUM CHLORIDE PO Take 1 tablet by mouth 3 (three) times daily. OTC    . simvastatin (ZOCOR) 40 MG tablet Take 1 tablet (40 mg total) by mouth daily. 30 tablet 6  . Specialty Vitamins Products (MAGNESIUM, AMINO ACID CHELATE,) 133 MG tablet Take 1 tablet by mouth daily.     Current Facility-Administered Medications  Medication Dose Route Frequency Provider Last Rate Last Admin  . 0.9 %  sodium chloride infusion  500 mL Intravenous Continuous Pyrtle, Lajuan Lines, MD        PHYSICAL EXAMINATION: ECOG PERFORMANCE STATUS: 1 - Symptomatic but completely ambulatory  Vitals:   02/09/20 0850  BP: (!) 157/72  Pulse: 85  Resp: 18  Temp: 98.3 F (36.8 C)  SpO2: 97%   Filed Weights   02/09/20 0850  Weight: 210 lb 12.8 oz (95.6 kg)    GENERAL:alert, no distress and comfortable SKIN: skin color, texture, turgor are normal, no rashes or significant lesions EYES: normal, Conjunctiva are pink and non-injected, sclera clear OROPHARYNX:no exudate, no erythema and lips, buccal mucosa, and tongue normal  NECK: supple, thyroid normal size, non-tender, without nodularity LYMPH:  no palpable lymphadenopathy in the cervical, axillary or inguinal LUNGS: clear to auscultation and percussion with normal breathing effort HEART: regular rate & rhythm and no murmurs and no lower extremity edema ABDOMEN:abdomen soft, non-tender and normal bowel sounds Musculoskeletal:no cyanosis of digits and no clubbing  NEURO: alert & oriented x 3 with fluent speech, no focal motor/sensory deficits  LABORATORY DATA:  I have reviewed the data as listed    Component Value Date/Time   NA 141 02/09/2020 0818   NA 143 11/25/2019 0842   NA 143 02/06/2017 1302   K 4.2 02/09/2020 0818   K 4.8 02/06/2017 1302   CL 107 02/09/2020 0818   CO2 25 02/09/2020 0818   CO2 26 02/06/2017 1302   GLUCOSE 104 (H) 02/09/2020 0818   GLUCOSE 100 02/06/2017 1302    BUN 13 02/09/2020 0818   BUN 14 11/25/2019 0842   BUN 14.2 02/06/2017 1302   CREATININE 0.78 02/09/2020 0818   CREATININE 0.8 02/06/2017 1302   CALCIUM 9.5 02/09/2020 0818   CALCIUM 9.7 02/06/2017 1302   PROT 7.8 02/09/2020 0818   PROT 7.2 01/28/2020 0911   PROT 7.8 02/06/2017 1302   ALBUMIN 3.9 02/09/2020 0818   ALBUMIN 4.5 01/28/2020 0911   ALBUMIN 3.9 02/06/2017 1302   AST 16 02/09/2020 0818   AST 20 02/06/2017 1302   ALT 18 02/09/2020 0818   ALT 25 02/06/2017 1302   ALKPHOS 103 02/09/2020 0818   ALKPHOS 105 02/06/2017 1302   BILITOT 0.5 02/09/2020 0818  BILITOT 0.3 01/28/2020 0911   BILITOT 0.41 02/06/2017 1302   GFRNONAA >60 02/09/2020 0818   GFRAA >60 02/09/2020 0818    No results found for: SPEP, UPEP  Lab Results  Component Value Date   WBC 6.6 02/09/2020   NEUTROABS 4.6 02/09/2020   HGB 12.5 02/09/2020   HCT 38.0 02/09/2020   MCV 89.0 02/09/2020   PLT 229 02/09/2020      Chemistry      Component Value Date/Time   NA 141 02/09/2020 0818   NA 143 11/25/2019 0842   NA 143 02/06/2017 1302   K 4.2 02/09/2020 0818   K 4.8 02/06/2017 1302   CL 107 02/09/2020 0818   CO2 25 02/09/2020 0818   CO2 26 02/06/2017 1302   BUN 13 02/09/2020 0818   BUN 14 11/25/2019 0842   BUN 14.2 02/06/2017 1302   CREATININE 0.78 02/09/2020 0818   CREATININE 0.8 02/06/2017 1302      Component Value Date/Time   CALCIUM 9.5 02/09/2020 0818   CALCIUM 9.7 02/06/2017 1302   ALKPHOS 103 02/09/2020 0818   ALKPHOS 105 02/06/2017 1302   AST 16 02/09/2020 0818   AST 20 02/06/2017 1302   ALT 18 02/09/2020 0818   ALT 25 02/06/2017 1302   BILITOT 0.5 02/09/2020 0818   BILITOT 0.3 01/28/2020 0911   BILITOT 0.41 02/06/2017 1302

## 2020-02-09 NOTE — Telephone Encounter (Signed)
Not my patient- seen only once 3 years ago covering for Dr Ashok Cordia. Fine to establish with Dr Tamala Julian.

## 2020-02-09 NOTE — Assessment & Plan Note (Signed)
She is reluctant to take amlodipine due to leg swelling Her BP is high I suggest she sees her primary doctor for management

## 2020-02-09 NOTE — Telephone Encounter (Signed)
Dr. Annamaria Boots, are you ok with this patient seeing Dr. Tamala Julian for a consult?  scheduled consult w/ Dr. Tamala Julian before realizing pt was established w/ Dr. Annamaria Boots. Pt requested to switch providers. Is it ok to keep consult w/ Dr. Tamala Julian. Being seen for Lung infiltrate on CT. Please advise.

## 2020-02-09 NOTE — Assessment & Plan Note (Signed)
The patient shared with me a lot of other issues in regards to stress, difficulties with different physicians practices, her social isolation of having had to spend Christmas by herself, excessive fatigue, chronic dry mouth, chronic dry eyes etc.  I spoke with her primary care doctor today and she will try to see the patient soon to address or her concerns

## 2020-02-09 NOTE — Assessment & Plan Note (Signed)
She has no signs or symptoms to suggest cancer recurrence Her tumor marker is normal

## 2020-02-09 NOTE — Assessment & Plan Note (Signed)
Clinically, she has no signs of cancer recurrence There is no benefit for routine surveillance imaging study Her last PET CT scan from 2019 showed no evidence of cancer recurrence I will continue to see her once a year

## 2020-02-10 NOTE — Telephone Encounter (Signed)
Attempted to call pt to make sure she is aware of the scheduled consult with Dr. Tamala Julian on 8/9 at 10:30 but unable to reach. Left message for pt to return call so we can confirm that she is aware of that upcoming appt.  Will route this to front desk to follow up on.

## 2020-02-11 ENCOUNTER — Telehealth: Payer: Self-pay | Admitting: Hematology and Oncology

## 2020-02-11 NOTE — Telephone Encounter (Signed)
Scheduled per 6/22 sch msg. Called and spoke with pt ,confirmed 6/23 appts

## 2020-02-15 NOTE — Telephone Encounter (Signed)
Patient contacted and appointment time confirmed with Dr. Tamala Julian on 03/28/2020.

## 2020-02-17 ENCOUNTER — Ambulatory Visit (AMBULATORY_SURGERY_CENTER): Payer: Self-pay | Admitting: *Deleted

## 2020-02-17 ENCOUNTER — Other Ambulatory Visit: Payer: Self-pay

## 2020-02-17 VITALS — Ht 69.0 in | Wt 212.8 lb

## 2020-02-17 DIAGNOSIS — Z85038 Personal history of other malignant neoplasm of large intestine: Secondary | ICD-10-CM

## 2020-02-17 DIAGNOSIS — Z86010 Personal history of colonic polyps: Secondary | ICD-10-CM

## 2020-02-17 DIAGNOSIS — Z800 Family history of malignant neoplasm of digestive organs: Secondary | ICD-10-CM

## 2020-02-17 MED ORDER — PEG 3350-KCL-NA BICARB-NACL 420 G PO SOLR: 4000.0000 mL | Freq: Once | ORAL | 0 refills | Status: AC

## 2020-02-17 NOTE — Progress Notes (Signed)
10-25-2019 covid test   No egg or soy allergy known to patient  No issues with past sedation with any surgeries  or procedures, no intubation problems  No diet pills per patient No home 02 use per patient  No blood thinners per patient  Pt denies issues with constipation  No A fib or A flutter  EMMI video sent to pt's e mail  COVID 19 guidelines implemented in [place] today   Due to the COVID-19 pandemic we are asking patients to follow these guidelines. Please only bring one care partner. Please be aware that your care partner may wait in the car in the parking lot or if they feel like they will be too hot to wait in the car, they may wait in the lobby on the 4th floor. All care partners are required to wear a mask the entire time (we do not have any that we can provide them), they need to practice social distancing, and we will do a Covid check for all patient's and care partners when you arrive. Also we will check their temperature and your temperature. If the care partner waits in their car they need to stay in the parking lot the entire time and we will call them on their cell phone when the patient is ready for discharge so they can bring the car to the front of the building. Also all patient's will need to wear a mask into building.

## 2020-02-19 DIAGNOSIS — Z1231 Encounter for screening mammogram for malignant neoplasm of breast: Secondary | ICD-10-CM | POA: Diagnosis not present

## 2020-03-01 DIAGNOSIS — I10 Essential (primary) hypertension: Secondary | ICD-10-CM | POA: Diagnosis not present

## 2020-03-01 DIAGNOSIS — R7303 Prediabetes: Secondary | ICD-10-CM | POA: Diagnosis not present

## 2020-03-01 DIAGNOSIS — R5382 Chronic fatigue, unspecified: Secondary | ICD-10-CM | POA: Diagnosis not present

## 2020-03-04 ENCOUNTER — Encounter: Payer: Self-pay | Admitting: Internal Medicine

## 2020-03-04 ENCOUNTER — Other Ambulatory Visit: Payer: Self-pay

## 2020-03-04 ENCOUNTER — Ambulatory Visit (AMBULATORY_SURGERY_CENTER): Payer: PPO | Admitting: Internal Medicine

## 2020-03-04 VITALS — BP 138/75 | HR 79 | Temp 97.3°F | Resp 14 | Ht 69.0 in | Wt 212.0 lb

## 2020-03-04 DIAGNOSIS — Z85038 Personal history of other malignant neoplasm of large intestine: Secondary | ICD-10-CM | POA: Diagnosis not present

## 2020-03-04 DIAGNOSIS — Z800 Family history of malignant neoplasm of digestive organs: Secondary | ICD-10-CM | POA: Diagnosis not present

## 2020-03-04 DIAGNOSIS — Z1211 Encounter for screening for malignant neoplasm of colon: Secondary | ICD-10-CM | POA: Diagnosis not present

## 2020-03-04 DIAGNOSIS — Z86010 Personal history of colonic polyps: Secondary | ICD-10-CM | POA: Diagnosis not present

## 2020-03-04 DIAGNOSIS — D122 Benign neoplasm of ascending colon: Secondary | ICD-10-CM | POA: Diagnosis not present

## 2020-03-04 DIAGNOSIS — D123 Benign neoplasm of transverse colon: Secondary | ICD-10-CM | POA: Diagnosis not present

## 2020-03-04 MED ORDER — SODIUM CHLORIDE 0.9 % IV SOLN: 500.0000 mL | Freq: Once | INTRAVENOUS | Status: DC

## 2020-03-04 NOTE — Op Note (Signed)
[facility] Patient Name: Kinzleigh Kandler Procedure Date: 03/04/2020 11:08 AM MRN: [id number] Endoscopist: Jerene Bears , MD Age: 79 Referring MD:  Date of Birth: [birth year] Gender: Female Account #: [id number] Procedure:                Colonoscopy Indications:              Surveillance: Personal history of adenomatous and                            sessile serrated polyps on last colonoscopy 3 years                            ago, family history of colon cancer, personal                            history of mucinous neoplasm of the appendix Medicines:                Propofol per Anesthesia Procedure:                Pre-Anesthesia Assessment:                           - Prior to the procedure, a History and Physical                            was performed, and patient medications and                            allergies were reviewed. The patient's tolerance of                            previous anesthesia was also reviewed. The risks                            and benefits of the procedure and the sedation                            options and risks were discussed with the patient.                            All questions were answered, and informed consent                            was obtained. Prior Anticoagulants: The patient has                            taken no previous anticoagulant or antiplatelet                            agents. ASA Grade Assessment: II - A patient with                            mild systemic disease. After reviewing the risks  and benefits, the patient was deemed in                            satisfactory condition to undergo the procedure.                           After obtaining informed consent, the colonoscope                            was passed under direct vision. Throughout the                            procedure, the patient's blood pressure, pulse, and                            oxygen saturations  were monitored continuously. The                            Colonoscope was introduced through the anus and                            advanced to the cecum, identified by appendiceal                            orifice and ileocecal valve. The colonoscopy was                            performed without difficulty. The patient tolerated                            the procedure well. The quality of the bowel                            preparation was good. The ileocecal valve,                            appendiceal orifice, and rectum were photographed. Scope In: 11:21:09 AM Scope Out: 11:36:49 AM Scope Withdrawal Time: 0 hours 10 minutes 0 seconds  Total Procedure Duration: 0 hours 15 minutes 40 seconds  Findings:                 The digital rectal exam was normal.                           Two very small angioectasias without bleeding were                            found in the cecum.                           A 4 mm polyp was found in the ascending colon. The                            polyp was sessile. The polyp was removed with a  cold snare. Resection and retrieval were complete.                           A 4 mm polyp was found in the transverse colon. The                            polyp was sessile. The polyp was removed with a                            cold snare. Resection and retrieval were complete.                           Multiple small and large-mouthed diverticula were                            found in the sigmoid colon and descending colon.                           Internal hemorrhoids were found during                            retroflexion. The hemorrhoids were small. Complications:            No immediate complications. Estimated Blood Loss:     Estimated blood loss was minimal. Impression:               - Two non-bleeding colonic angioectasias.                           - One 4 mm polyp in the ascending colon, removed                             with a cold snare. Resected and retrieved.                           - One 4 mm polyp in the transverse colon, removed                            with a cold snare. Resected and retrieved.                           - Diverticulosis in the sigmoid colon and in the                            descending colon.                           - Small internal hemorrhoids. Recommendation:           - Patient has a contact number available for                            emergencies. The signs and symptoms of potential  delayed complications were discussed with the                            patient. Return to normal activities tomorrow.                            Written discharge instructions were provided to the                            patient.                           - Resume previous diet.                           - Continue present medications.                           - Await pathology results.                           - No repeat colonoscopy due to age great than 13                            years at next screening/surveillance interval. Jerene Bears, MD 03/04/2020 11:41:35 AM This report has been signed electronically.

## 2020-03-04 NOTE — Progress Notes (Signed)
A and O x3. Report to RN. Tolerated MAC anesthesia well.

## 2020-03-04 NOTE — Progress Notes (Signed)
Pt's states no medical or surgical changes since previsit or office visit.  CW - vitals 

## 2020-03-04 NOTE — Progress Notes (Signed)
Called to room to assist during endoscopic procedure.  Patient ID and intended procedure confirmed with present staff. Received instructions for my participation in the procedure from the performing physician.  

## 2020-03-04 NOTE — Patient Instructions (Signed)
Handouts given:  Diverticulosis, Polyps, Hemorrhoids Resume previous diet Continue present medications Await pathology results  No need for repeat colonoscopy    YOU HAD AN ENDOSCOPIC PROCEDURE TODAY AT [facility]:   Refer to the procedure report that was given to you for any specific questions about what was found during the examination.  If the procedure report does not answer your questions, please call your gastroenterologist to clarify.  If you requested that your care partner not be given the details of your procedure findings, then the procedure report has been included in a sealed envelope for you to review at your convenience later.  YOU SHOULD EXPECT: Some feelings of bloating in the abdomen. Passage of more gas than usual.  Walking can help get rid of the air that was put into your GI tract during the procedure and reduce the bloating. If you had a lower endoscopy (such as a colonoscopy or flexible sigmoidoscopy) you may notice spotting of blood in your stool or on the toilet paper. If you underwent a bowel prep for your procedure, you may not have a normal bowel movement for a few days.  Please Note:  You might notice some irritation and congestion in your nose or some drainage.  This is from the oxygen used during your procedure.  There is no need for concern and it should clear up in a day or so.  SYMPTOMS TO REPORT IMMEDIATELY:   Following lower endoscopy (colonoscopy or flexible sigmoidoscopy):  Excessive amounts of blood in the stool  Significant tenderness or worsening of abdominal pains  Swelling of the abdomen that is new, acute  Fever of 100F or higher   For urgent or emergent issues, a gastroenterologist can be reached at any hour by calling [phone]. Do not use MyChart messaging for urgent concerns.    DIET:  We do recommend a small meal at first, but then you may proceed to your regular diet.  Drink plenty of fluids but you should avoid  alcoholic beverages for 24 hours.  ACTIVITY:  You should plan to take it easy for the rest of today and you should NOT DRIVE or use heavy machinery until tomorrow (because of the sedation medicines used during the test).    FOLLOW UP: Our staff will call the number listed on your records 48-72 hours following your procedure to check on you and address any questions or concerns that you may have regarding the information given to you following your procedure. If we do not reach you, we will leave a message.  We will attempt to reach you two times.  During this call, we will ask if you have developed any symptoms of COVID 19. If you develop any symptoms (ie: fever, flu-like symptoms, shortness of breath, cough etc.) before then, please call [phone].  If you test positive for Covid 19 in the 2 weeks post procedure, please call and report this information to [place].    If any biopsies were taken you will be contacted by phone or by letter within the next 1-3 weeks.  Please call us at [phone] if you have not heard about the biopsies in 3 weeks.    SIGNATURES/CONFIDENTIALITY: You and/or your care partner have signed paperwork which will be entered into your electronic medical record.  These signatures attest to the fact that that the information above on your After Visit Summary has been reviewed and is understood.  Full responsibility of the confidentiality of this discharge information  lies with you and/or your care-partner.

## 2020-03-08 ENCOUNTER — Telehealth: Payer: Self-pay

## 2020-03-08 NOTE — Telephone Encounter (Signed)
  Follow up Call-  Call back number 03/04/2020  Post procedure Call Back phone  # [phone]  Permission to leave phone message Yes  Some recent data might be hidden     Patient questions:  Do you have a fever, pain , or abdominal swelling? No. Pain Score  0 *  Have you tolerated food without any problems? Yes.    Have you been able to return to your normal activities? Yes.    Do you have any questions about your discharge instructions: Diet   No. Medications  No Follow up visit  No.  Do you have questions or concerns about your Care? No.  Actions: * If pain score is 4 or above: No action needed, pain <4.  1. Have you developed a fever since your procedure? No  2.   Have you had an respiratory symptoms (SOB or cough) since your procedure? No  3.   Have you tested positive for COVID 19 since your procedure No 4.   Have you had any family members/close contacts diagnosed with the COVID 19 since your procedure? No  If yes to any of these questions please route to Joylene John, RN and Erenest Rasher, RN

## 2020-03-13 ENCOUNTER — Encounter: Payer: Self-pay | Admitting: Internal Medicine

## 2020-03-22 DIAGNOSIS — H26491 Other secondary cataract, right eye: Secondary | ICD-10-CM | POA: Diagnosis not present

## 2020-03-22 DIAGNOSIS — H04123 Dry eye syndrome of bilateral lacrimal glands: Secondary | ICD-10-CM | POA: Diagnosis not present

## 2020-03-28 ENCOUNTER — Institutional Professional Consult (permissible substitution): Payer: PPO | Admitting: Internal Medicine

## 2020-04-04 DIAGNOSIS — F331 Major depressive disorder, recurrent, moderate: Secondary | ICD-10-CM | POA: Diagnosis not present

## 2020-04-04 DIAGNOSIS — F4310 Post-traumatic stress disorder, unspecified: Secondary | ICD-10-CM | POA: Diagnosis not present

## 2020-04-04 DIAGNOSIS — Z23 Encounter for immunization: Secondary | ICD-10-CM | POA: Diagnosis not present

## 2020-04-26 ENCOUNTER — Ambulatory Visit: Payer: PPO | Admitting: General Practice

## 2020-05-12 DIAGNOSIS — Z23 Encounter for immunization: Secondary | ICD-10-CM | POA: Diagnosis not present

## 2020-05-20 ENCOUNTER — Ambulatory Visit (INDEPENDENT_AMBULATORY_CARE_PROVIDER_SITE_OTHER): Payer: PPO | Admitting: Pulmonary Disease

## 2020-05-20 ENCOUNTER — Encounter: Payer: Self-pay | Admitting: Pulmonary Disease

## 2020-05-20 ENCOUNTER — Other Ambulatory Visit: Payer: Self-pay

## 2020-05-20 ENCOUNTER — Ambulatory Visit (INDEPENDENT_AMBULATORY_CARE_PROVIDER_SITE_OTHER): Payer: PPO

## 2020-05-20 VITALS — BP 138/70 | HR 75 | Temp 97.5°F | Wt 207.5 lb

## 2020-05-20 DIAGNOSIS — R9389 Abnormal findings on diagnostic imaging of other specified body structures: Secondary | ICD-10-CM | POA: Diagnosis not present

## 2020-05-20 DIAGNOSIS — G8929 Other chronic pain: Secondary | ICD-10-CM

## 2020-05-20 DIAGNOSIS — M546 Pain in thoracic spine: Secondary | ICD-10-CM | POA: Diagnosis not present

## 2020-05-20 DIAGNOSIS — R942 Abnormal results of pulmonary function studies: Secondary | ICD-10-CM

## 2020-05-20 DIAGNOSIS — R079 Chest pain, unspecified: Secondary | ICD-10-CM | POA: Diagnosis not present

## 2020-05-20 DIAGNOSIS — R918 Other nonspecific abnormal finding of lung field: Secondary | ICD-10-CM

## 2020-05-20 NOTE — Patient Instructions (Addendum)
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 Some common causes include:  Wear and tear (degenerative disease) of the bones, ligaments, or disks in your back.  Inflammation and stiffness in your back (arthritis). People who have chronic back pain often go through certain periods in which the pain is more intense (flare-ups). Many people can learn to manage the pain with home care. Follow these instructions at home: Pay attention to any changes in your symptoms. Take these actions to help with your pain: Activity   Avoid bending and other activities that make the problem worse.  Maintain a proper position when standing or sitting: ? When standing, keep your upper back and neck straight, with your shoulders pulled back. Avoid slouching. ? When sitting, keep your back straight and relax your shoulders. Do not round your shoulders or pull them backward.  Do not sit or stand in one place for long periods of time.  Take brief periods of rest throughout the day. This will reduce your pain. Resting in a lying or standing position is usually better than sitting to rest.  When you are resting for longer periods, mix in some mild activity or stretching between periods of rest. This will help to prevent stiffness and pain.  Get regular exercise. Ask your health care provider what activities are safe for you.  Do not lift anything that is heavier than 10 lb (4.5 kg). Always use proper lifting technique, which includes: ? Bending your knees. ? Keeping the load close to your body. ? Avoiding twisting.  Sleep on a firm mattress in a comfortable  position. Try lying on your side with your knees slightly bent. If you lie on your back, put a pillow under your knees. Managing pain  If directed, apply ice to the painful area. Your health care provider may recommend applying ice during the first 24-48 hours after a flare-up begins. ? Put ice in a plastic bag. ? Place a towel between your skin and the bag. ? Leave the ice on for 20 minutes, 2-3 times per day.  If directed, apply heat to the affected area as often as told by your health care provider. Use the heat source that your health care provider recommends, such as a moist heat pack or a heating pad. ? Place a towel between your skin and the heat source. ? Leave the heat on for 20-30 minutes. ? Remove the heat if your skin turns bright red. This is especially important if you are unable to feel pain, heat, or cold. You may have a greater risk of getting burned.  Try soaking in a warm tub.  Take over-the-counter and prescription medicines only as told by your health care provider.  Keep all follow-up visits as told by your health care provider. This is important. Contact a health care provider if:  You have pain that is not relieved with rest or medicine. Get help right away if:  You have weakness or numbness in one or both of your legs or feet.  You have trouble controlling your bladder or your bowels.  You have nausea or vomiting.  You have pain in your abdomen.  You have shortness of  breath or you faint. This information is not intended to replace advice given to you by your health care provider. Make sure you discuss any questions you have with your health care provider. Document Revised: 11/27/2018 Document Reviewed: 02/13/2017 Elsevier Patient Education  [place].

## 2020-05-20 NOTE — Progress Notes (Signed)
Subjective:   PATIENT ID: [name] GENDER: female DOB: October 29, 1940, MRN: [id number]   HPI  Chief Complaint  Patient presents with  . Follow-up    former pt of [facility], having pain in her left mid back over her left lung.  has this pain over the last 4 years but this pain is more pronounced now.     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  She is referred to Pulmonary clinic by her Oncologist, Dr. Alvy Bimler. She was last seen by her on 02/09/20 when referral was made for chest wall pain. At that time, PET/CT was reviewed and did not demonstrate any lung/chest abnormalities. Earlier this year, she was also evaluated by cardiology and underwent cardiac cath in April 2021 that did not demonstrate significant CAD.  She was previously seen in our Pulmonary clinic by Dr. Ashok Cordia. 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 She was treated for acute bronchitis with steroid taper and then seems to have been lost to follow-up.  She has had intermittent left sided lung/back pain that has worsened in the last year. Denies recent or prior trauma. Seems to occur with long periods of sitting and laying down. It seems to be more persistent. Has not taken and over the counter. She has not been as active as she previously has been and starting gaining weight in the last few years. She used to walk daily and trying to restart short walks in the last week but admits to not being consistent. Denies shortness of breath, cough, wheezing, congestion.  Social History: Never smoker  Environmental exposures: Radiation  I have personally reviewed patient's past medical/family/social history, allergies, current medications.  Past Medical History:  Diagnosis Date  . Anxiety   .  Arthritis    right thumb [facility] arthritis  . Asthmatic bronchitis 2017  . Cancer ([place])    lymphoma  . Cataract    bilateral small- removed both eyes   . Colon cancer ([place])    stage 1- at appendiceal orifice   . Complication of anesthesia    has dry mouth anyway from shograns-sore throats post op  . Coronary artery calcification seen on CAT scan 11/14/2019  . Depression    since 1982   . Diverticulosis   . Fibromyalgia   . GERD (gastroesophageal reflux disease)    past hx   . Heart murmur   . Hyperlipidemia    controlled - metabolic syndrome per pt- goes up and down   . Hypertension   . Lung infiltrate on CT 09/14/2015  . Meningioma ([place]) 11/2009   right post parietal 4mm - stable 11/2009 MRI  . Nephrolithiasis   . Nephrolithiasis   . Neuromuscular disorder ([facility])    fibromyalgia  . 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 Pure hypercholesterolemia 11/14/2019  . Sjogren's disease ([place])      Family History  Problem Relation Age of Onset  . Colon cancer Mother 66  . Colon cancer Father        unknown when onset was  . Diabetes Neg Hx   . Stomach cancer Neg Hx   . Colon polyps Neg Hx   . Esophageal cancer Neg Hx   . Rectal cancer Neg Hx  Social History   Occupational History  . Occupation: Retired    Fish farm manager: UNEMPLOYED  Tobacco Use  . Smoking status: Never Smoker  . Smokeless tobacco: Never Used  . Tobacco comment: Passive exposure through her mother.  Vaping Use  . Vaping Use: Never used  Substance and Sexual Activity  . Alcohol use: Yes    Alcohol/week: 0.0 standard drinks    Comment: Rarely  . Drug use: No  . Sexual activity: Not on file    Comment: [place] sister.    Allergies  Allergen Reactions  . Losartan Other (See Comments)    Causes somnolence  . Amoxicillin Rash     Outpatient Medications Prior to Visit  Medication Sig Dispense Refill  . acetaminophen (TYLENOL) 500 MG tablet Take 500 mg by mouth every  6 (six) hours as needed for moderate pain.    . Aspirin 81 MG CAPS Take 1 capsule by mouth daily.    . Calcium Carb-Cholecalciferol (CALCIUM 600-D PO) Take 1 tablet by mouth daily.    . Calcium Carbonate-Vitamin D (CALCIUM + D) 600-200 MG-UNIT TABS Take 1 tablet by mouth daily.     . Cholecalciferol (VITAMIN D3) 50 MCG (2000 UT) TABS Take 2 tablets by mouth daily.    . Magnesium 250 MG TABS Take 2 tablets by mouth daily.    . Potassium 99 MG TABS Take 2 tablets by mouth daily.    . simvastatin (ZOCOR) 20 MG tablet Take 20 mg by mouth at bedtime.     . vitamin B-12 (CYANOCOBALAMIN) 1000 MCG tablet Take 1,000 mcg by mouth as needed.     Marland Kitchen amLODipine (NORVASC) 5 MG tablet Take 1 tablet (5 mg total) by mouth daily. 90 tablet 3  . Cholecalciferol (D3 ADULT) 25 MCG (1000 UT) CHEW Chew 1,000 Units by mouth daily.     . hydroxypropyl methylcellulose / hypromellose (ISOPTO TEARS / GONIOVISC) 2.5 % ophthalmic solution Place 1 drop into both eyes 3 (three) times daily as needed for dry eyes.    Marland Kitchen POTASSIUM CHLORIDE PO Take 1 tablet by mouth 3 (three) times daily. OTC    . simvastatin (ZOCOR) 40 MG tablet Take 1 tablet (40 mg total) by mouth daily. (Patient not taking: Reported on 05/20/2020) 30 tablet 6  . Specialty Vitamins Products (MAGNESIUM, AMINO ACID CHELATE,) 133 MG tablet Take 1 tablet by mouth daily.     No facility-administered medications prior to visit.    Review of Systems  Constitutional: Negative for chills, diaphoresis, fever, malaise/fatigue and weight loss.  HENT: Negative for congestion, ear pain and sore throat.   Respiratory: Negative for cough, hemoptysis, sputum production, shortness of breath and wheezing.   Cardiovascular: Positive for chest pain. Negative for palpitations and leg swelling.  Gastrointestinal: Negative for abdominal pain, heartburn and nausea.  Genitourinary: Negative for frequency.  Musculoskeletal: Positive for back pain. Negative for joint pain and myalgias.    Skin: Negative for itching and rash.  Neurological: Negative for dizziness, weakness and headaches.  Endo/Heme/Allergies: Does not bruise/bleed easily.  Psychiatric/Behavioral: Negative for depression. The patient is not nervous/anxious.      Objective:   Vitals:   05/20/20 1131  BP: 138/70  Pulse: 75  Temp: (!) 97.5 F (36.4 C)  TempSrc: Oral  SpO2: 95%  Weight: 207 lb 8 oz (94.1 kg)   SpO2: 95 %  Physical Exam: General: Well-appearing, no acute distress HENT: [place], AT Eyes: EOMI, no scleral icterus Respiratory: Clear to auscultation bilaterally.  No  crackles, wheezing or rales Cardiovascular: RRR, -M/R/G, no JVD Extremities:-Edema,-tenderness Neuro: AAO x4, CNII-XII grossly intact Skin: Intact, no rashes or bruising Psych: Normal mood, normal affect Musculoskeletal: Full range of motion including back flexion and extension. No limitation in bending or lateral movements.  Data Reviewed:  Imaging: PET/CT 02/06/18 - Negative for hypermetabolic activity. Thin-walled cysts in mid and lower lungs bilaterally.  PFT: None on file  Labs: 10/18/15 ANA 1:1280 SSA >8 SSB>8 CCP neg  Echo: 12/08/19 EF 70-75%. Age-related DD. No WMA or valvular abnormalities.  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 Physical exam benign. History suggests this may be musculoskeletal etiology especially since pain is exacerbated by long periods of sitting/laying down. She has had decreased physical activity in the last year and has not tried any conservative measures. Regarding her cystic lung disease, this is likely related to her known autoimmune issues. It would atypical to be associated with her described lung pain. There has not been any dedicated chest imaging. Will obtain HRCT for work-up.  Cystic lung  disease Suspect LIP in setting of Sjogrens --HRCT Chest  Left-sided "lung" pain May be musculoskeletal-related --Recommend conservative management including stretching, yoga, heating pads and tylenol as needed --Start regular aerobic exercise  Health Maintenance Immunization History  Administered Date(s) Administered  . Influenza Split 05/15/2012, 05/21/2015  . Influenza Whole 05/21/2007, 05/19/2008, 06/01/2010  . PFIZER SARS-COV-2 Vaccination 10/03/2019, 10/25/2019  . Pneumococcal Polysaccharide-23 08/20/1998  . Td 04/29/2006  . Zoster 06/23/2007   CT Lung Screen - not qualified  Orders Placed This Encounter  Procedures  . DG Chest 2 View    Standing Status:   Future    Number of Occurrences:   1    Standing Expiration Date:   05/20/2021    Order Specific Question:   Reason for Exam (SYMPTOM  OR DIAGNOSIS REQUIRED)    Answer:   left lung pain    Order Specific Question:   Preferred imaging location?    Answer:   Internal  . CT Chest High Resolution    Standing Status:   Future    Standing Expiration Date:   05/20/2021    Order Specific Question:   Preferred imaging location?    Answer:   [facility]  No orders of the defined types were placed in this encounter.  Return in about 6 weeks (around 07/01/2020).  [place], MD [facility] 05/20/2020 11:44 AM  Office Number [phone]

## 2020-05-28 ENCOUNTER — Encounter (HOSPITAL_COMMUNITY): Payer: Self-pay

## 2020-05-28 ENCOUNTER — Emergency Department (HOSPITAL_COMMUNITY)
Admission: EM | Admit: 2020-05-28 | Discharge: 2020-05-28 | Disposition: A | Discharge status: Home / Self Care | Payer: PPO | Attending: Emergency Medicine | Admitting: Emergency Medicine

## 2020-05-28 ENCOUNTER — Emergency Department (HOSPITAL_COMMUNITY): Payer: PPO

## 2020-05-28 DIAGNOSIS — Z79899 Other long term (current) drug therapy: Secondary | ICD-10-CM | POA: Insufficient documentation

## 2020-05-28 DIAGNOSIS — N281 Cyst of kidney, acquired: Secondary | ICD-10-CM | POA: Diagnosis not present

## 2020-05-28 DIAGNOSIS — Z7982 Long term (current) use of aspirin: Secondary | ICD-10-CM | POA: Insufficient documentation

## 2020-05-28 DIAGNOSIS — K5730 Diverticulosis of large intestine without perforation or abscess without bleeding: Secondary | ICD-10-CM | POA: Diagnosis not present

## 2020-05-28 DIAGNOSIS — I10 Essential (primary) hypertension: Secondary | ICD-10-CM | POA: Diagnosis not present

## 2020-05-28 DIAGNOSIS — N289 Disorder of kidney and ureter, unspecified: Secondary | ICD-10-CM

## 2020-05-28 DIAGNOSIS — R634 Abnormal weight loss: Secondary | ICD-10-CM | POA: Diagnosis not present

## 2020-05-28 DIAGNOSIS — R911 Solitary pulmonary nodule: Secondary | ICD-10-CM | POA: Insufficient documentation

## 2020-05-28 DIAGNOSIS — R319 Hematuria, unspecified: Secondary | ICD-10-CM

## 2020-05-28 DIAGNOSIS — Z955 Presence of coronary angioplasty implant and graft: Secondary | ICD-10-CM | POA: Diagnosis not present

## 2020-05-28 DIAGNOSIS — Z8572 Personal history of non-Hodgkin lymphomas: Secondary | ICD-10-CM | POA: Diagnosis not present

## 2020-05-28 LAB — COMPREHENSIVE METABOLIC PANEL
ALT: 22 U/L (ref 0–44)
AST: 23 U/L (ref 15–41)
Albumin: 3.9 g/dL (ref 3.5–5.0)
Alkaline Phosphatase: 83 U/L (ref 38–126)
Anion gap: 12 (ref 5–15)
BUN: 11 mg/dL (ref 8–23)
CO2: 22 mmol/L (ref 22–32)
Calcium: 9.1 mg/dL (ref 8.9–10.3)
Chloride: 106 mmol/L (ref 98–111)
Creatinine, Ser: 0.67 mg/dL (ref 0.44–1.00)
GFR, Estimated: >60 mL/min (ref >60)
Glucose, Bld: 121 mg/dL — ABNORMAL HIGH (ref 70–99)
Potassium: 4.1 mmol/L (ref 3.5–5.1)
Sodium: 140 mmol/L (ref 135–145)
Total Bilirubin: 0.6 mg/dL (ref 0.3–1.2)
Total Protein: 7.1 g/dL (ref 6.5–8.1)

## 2020-05-28 LAB — CBC WITH DIFFERENTIAL/PLATELET
Abs Immature Granulocytes: 0.01 10*3/uL (ref 0.00–0.07)
Basophils Absolute: 0 10*3/uL (ref 0.0–0.1)
Basophils Relative: 1 %
Eosinophils Absolute: 0.2 10*3/uL (ref 0.0–0.5)
Eosinophils Relative: 3 %
HCT: 38.1 % (ref 36.0–46.0)
Hemoglobin: 12.1 g/dL (ref 12.0–15.0)
Immature Granulocytes: 0 %
Lymphocytes Relative: 15 %
Lymphs Abs: 0.9 10*3/uL (ref 0.7–4.0)
MCH: 28.6 pg (ref 26.0–34.0)
MCHC: 31.8 g/dL (ref 30.0–36.0)
MCV: 90.1 fL (ref 80.0–100.0)
Monocytes Absolute: 0.6 10*3/uL (ref 0.1–1.0)
Monocytes Relative: 10 %
Neutro Abs: 4.2 10*3/uL (ref 1.7–7.7)
Neutrophils Relative %: 71 %
Platelets: 255 10*3/uL (ref 150–400)
RBC: 4.23 MIL/uL (ref 3.87–5.11)
RDW: 12.7 % (ref 11.5–15.5)
WBC: 5.8 10*3/uL (ref 4.0–10.5)
nRBC: 0 % (ref 0.0–0.2)

## 2020-05-28 LAB — URINALYSIS, ROUTINE W REFLEX MICROSCOPIC
Bacteria, UA: NONE SEEN
Bilirubin Urine: NEGATIVE
Glucose, UA: NEGATIVE mg/dL
Ketones, ur: NEGATIVE mg/dL
Leukocytes,Ua: NEGATIVE
Nitrite: NEGATIVE
Protein, ur: NEGATIVE mg/dL
Specific Gravity, Urine: 1.02 (ref 1.005–1.030)
pH: 7 (ref 5.0–8.0)

## 2020-05-28 LAB — SAMPLE TO BLOOD BANK

## 2020-05-28 MED ORDER — DOXYCYCLINE HYCLATE 100 MG PO CAPS: 100.0000 mg | ORAL_CAPSULE | Freq: Two times a day (BID) | ORAL | 0 refills | Status: DC

## 2020-05-28 MED ORDER — IOHEXOL 300 MG/ML  SOLN
100.0000 mL | Freq: Once | INTRAMUSCULAR | Status: AC | PRN
  Administered 2020-05-28: 100 mL via INTRAVENOUS

## 2020-05-28 NOTE — ED Triage Notes (Signed)
Pt reports that she began to have vaginal bleeding that started an hour ago.

## 2020-05-28 NOTE — Discharge Instructions (Signed)
You are seen in the emergency department today for an episode of bleeding. Your work-up in terms of your blood work was overall reassuring. Your CT scan did show abnormal findings on both sides of your lungs as detailed below. We would like you to try a course of doxycycline, an antibiotic, to treat any possible infectious cause of these abnormalities. You will need a repeat CT of your chest in 6 to 8 weeks, please discuss with your pulmonologist Dr. Loanne Drilling to schedule this. Your CT scan also showed what appears to be a complicated cyst on your right kidney we recommended to have an ultrasound performed specifically of the kidney to further evaluate this, please discuss with your primary care provider or your urologist.  Given that you have blood in your urine we would like you to follow-up with urology, please call for an appointment within the next 3 days. Follow-up with your primary care as well for general follow-up and management. Return to the ER for new or worsening symptoms or any other concerns.

## 2020-05-28 NOTE — ED Provider Notes (Signed)
[facility] EMERGENCY DEPARTMENT Provider Note   CSN: [id number] Arrival date & time: 05/28/20  0354    History Chief Complaint  Patient presents with  . Vaginal Bleeding    [name] is a 79 y.o. female with a history of non-hodgkin's lymphoma, hypercholesterolemia, stage I colon cancer, fibromyalgia, Sjogren's disease, and hypertension who presents to the ED with complaints of vaginal bleeding that worsened this AM. Patient states yesterday she noted a small amount of blood on the toilet paper when she wiped after urinating, no further blood noted throughout the day, but then this AM had increased bleeding. She got up to use the restroom to urinate and did not note any blood however when she returned to bed she felt like she needed to wipe again and possibly urinate again prompting her to return to the commode she subsequently had a large amount of vaginal bleeding. States she filled 3-4 toilet bowls with blood and that she is confident it is not coming from her rectum. Given the amount of bleeding she came to the ED to get checked out. Her lower abdomen has been sore. She has had some weight loss recently which she attributed to stress. Denies lightheadedness, dizziness, syncope, chest pain, dyspnea, vomiting, diarrhea, melena, or hematochezia.   HPI     Past Medical History:  Diagnosis Date  . Anxiety   . Arthritis    right thumb [facility] arthritis  . Asthmatic bronchitis 2017  . Cancer ([place])    lymphoma  . Cataract    bilateral small- removed both eyes   . Colon cancer ([place])    stage 1- at appendiceal orifice   . Complication of anesthesia    has dry mouth anyway from shograns-sore throats post op  . Coronary artery calcification seen on CAT scan 11/14/2019  . Depression    since 1982   . Diverticulosis   . Fibromyalgia   . GERD (gastroesophageal reflux disease)    past hx   . Heart murmur   . Hyperlipidemia    controlled - metabolic syndrome per pt-  goes up and down   . Hypertension   . Lung infiltrate on CT 09/14/2015  . Meningioma ([place]) 11/2009   right post parietal 6mm - stable 11/2009 MRI  . Nephrolithiasis   . Nephrolithiasis   . Neuromuscular disorder (HCC)    fibromyalgia  . 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[place]) 08/31/2015  . Pure hypercholesterolemia 11/14/2019  . Sjogren's disease [facility])     Patient Active Problem List   Diagnosis Date Noted  . Multiple complaints 02/09/2020  . Coronary artery calcification 11/14/2019  . Pure hypercholesterolemia 11/14/2019  . Skin rash 02/14/2016  . Left flank pain 02/14/2016  . Acute bronchitis 01/12/2016  . Hypersensitivity reaction 12/02/2015  . Essential hypertension 09/15/2015  . Lung infiltrate on CT 09/14/2015  . Pulmonary nodule 09/13/2015  . History of colon cancer, stage I 08/31/2015  . 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 H/O superficial parotidectomy 08/01/2015  . Other abnormal glucose 01/23/2013  . Left-sided headache 01/15/2013  . Chronic diarrhea 05/15/2012  . Bruising 01/25/2012  . Palpitations 11/12/2011  . Atypical chest pain 11/12/2011  . Preventative health care 05/15/2011  . Angina pectoris ([place]) 02/02/2010  . GERD 12/26/2009  . CONTACT DERMATITIS 09/02/2007  . DE QUERVAIN'S TENOSYNOVITIS 09/02/2007  . MENINGIOMA 07/22/2007  . DIVERTICULOSIS, COLON 07/22/2007  . FIBROMYALGIA 07/22/2007  . HYPERLIPIDEMIA 07/21/2007  .  [place] SYNDROME 07/21/2007    Past Surgical History:  Procedure Laterality Date  . ABDOMINAL HYSTERECTOMY    . APPENDECTOMY  2008  . ARTERY BIOPSY  02/03/2013   Procedure: MINOR BIOPSY TEMPORAL ARTERY;  Surgeon: Ascencion Dike, MD;  Location: [place];  Service: ENT;;  . COLONOSCOPY    . [place]  . LEFT HEART CATH AND CORONARY ANGIOGRAPHY N/A 12/01/2019   Procedure: LEFT HEART CATH AND CORONARY ANGIOGRAPHY;  Surgeon: Troy Sine, MD;  Location: [facility];  Service: Cardiovascular;  Laterality: N/A;  . MASS EXCISION  2008   abd-benign  . PAROTIDECTOMY Right 08/01/2015   Procedure: RIGHT PAROTIDECTOMY;  Surgeon: [name], MD;  Location: [place];  Service: ENT;  Laterality: Right;  . POLYPECTOMY    . TONSILLECTOMY    . VAGINAL HYSTERECTOMY  1989     OB History   No obstetric history on file.     Family History  Problem Relation Age of Onset  . Colon cancer Mother 57  . Colon cancer Father        unknown when onset was  . Diabetes Neg Hx   . Stomach cancer Neg Hx   . Colon polyps Neg Hx   . Esophageal cancer Neg Hx   . Rectal cancer Neg Hx     Social History   Tobacco Use  . Smoking status: Never Smoker  . Smokeless tobacco: Never Used  . Tobacco comment: Passive exposure through her mother.  Vaping Use  . Vaping Use: Never used  Substance Use Topics  . Alcohol use: Yes    Alcohol/week: 0.0 standard drinks    Comment: Rarely  . Drug use: No    Home Medications Prior to Admission medications   Medication Sig Start Date End Date Taking? Authorizing Provider  acetaminophen (TYLENOL) 500 MG tablet Take 500 mg by mouth every 6 (six) hours as needed for moderate pain.    [provider]  Aspirin 81 MG CAPS Take 1 capsule by mouth daily.    [provider]  Calcium Carb-Cholecalciferol (CALCIUM 600-D PO) Take 1 tablet by mouth daily.    [provider]  Calcium Carbonate-Vitamin D (CALCIUM + D) 600-200 MG-UNIT TABS Take 1 tablet by mouth daily.     [provider]  Cholecalciferol (VITAMIN D3) 50 MCG (2000 UT) TABS Take 2 tablets by mouth daily.    [provider]  Magnesium 250 MG TABS Take 2 tablets by mouth daily.    [provider]  Potassium 99 MG TABS Take 2 tablets by mouth daily.    [provider]  simvastatin (ZOCOR) 20 MG tablet Take 20 mg by mouth at bedtime.  02/01/20   [provider]  vitamin B-12  (CYANOCOBALAMIN) 1000 MCG tablet Take 1,000 mcg by mouth as needed.     [provider]    Allergies    Losartan and Amoxicillin  Review of Systems   Review of Systems  Constitutional: Positive for unexpected weight change. Negative for chills and fever.  Respiratory: Positive for cough. Negative for shortness of breath.   Cardiovascular: Negative for chest pain.  Gastrointestinal: Positive for abdominal pain. Negative for constipation, diarrhea, nausea and vomiting.  Genitourinary: Positive for vaginal bleeding.  Neurological: Negative for dizziness, syncope and light-headedness.  All other systems reviewed and are negative.   Physical Exam Updated Vital Signs BP (!) 185/71 (BP Location: Left Arm)  Pulse (!) 102   Temp 98.1 F (36.7 C) (Oral)   Resp 16   SpO2 93%   Physical Exam Vitals and nursing note reviewed. Exam conducted with a chaperone present.  Constitutional:      General: She is not in acute distress.    Appearance: She is well-developed. She is not toxic-appearing.  HENT:     Head: Normocephalic and atraumatic.  Eyes:     General:        Right eye: No discharge.        Left eye: No discharge.     Conjunctiva/sclera: Conjunctivae normal.  Cardiovascular:     Rate and Rhythm: Normal rate and regular rhythm.  Pulmonary:     Effort: Pulmonary effort is normal. No respiratory distress.     Breath sounds: Normal breath sounds. No wheezing, rhonchi or rales.  Abdominal:     General: There is no distension.     Palpations: Abdomen is soft.     Tenderness: There is no abdominal tenderness. There is no guarding or rebound.  Genitourinary:    Comments: Toilet tissue present @ external vaginal vault removed- some blood present on this anteriorly. No significant lesions. Speculum exam w/o active bleeding or discharge noted. No significant pain/tenderness with bimanual exam.  Musculoskeletal:     Cervical back: Neck supple.  Skin:    General: Skin is warm  and dry.     Findings: No rash.  Neurological:     Mental Status: She is alert.     Comments: Clear speech.   Psychiatric:        Behavior: Behavior normal.    ED Results / Procedures / Treatments   Labs (all labs ordered are listed, but only abnormal results are displayed) Labs Reviewed  COMPREHENSIVE METABOLIC PANEL - Abnormal; Notable for the following components:      Result Value   Glucose, Bld 121 (*)    All other components within normal limits  URINALYSIS, ROUTINE W REFLEX MICROSCOPIC - Abnormal; Notable for the following components:   Color, Urine STRAW (*)    Hgb urine dipstick MODERATE (*)    All other components within normal limits  CBC WITH DIFFERENTIAL/PLATELET  SAMPLE TO BLOOD BANK    EKG None  Radiology CT Abdomen Pelvis W Contrast  Result Date: 05/28/2020 CLINICAL DATA:  Unintended weight loss over several weeks. Vaginal bleeding. History of lymphoma. EXAM: CT ABDOMEN AND PELVIS WITH CONTRAST TECHNIQUE: Multidetector CT imaging of the abdomen and pelvis was performed using the standard protocol following bolus administration of intravenous contrast. CONTRAST:  123mL OMNIPAQUE IOHEXOL 300 MG/ML  SOLN COMPARISON:  PET-CT 02/06/2018 FINDINGS: Lower chest: Normal heart size. Increased bandlike consolidation within the medial right middle lobe (image 2; series 5). Interval development of a 1.5 cm subpleural nodular area of consolidation within the left lower lobe (image 21; series 5). Additional subpleural bandlike consolidation left lower lobe (image 7; series 5). Patchy ground-glass opacities within the lingula. Mild interval increase in size of masslike area within the medial right lower lobe measuring 2.5 x 2.1 cm, previously 1.8 x 1.8 cm. Multiple focal areas of soft tissue within the lesion with associated cystic changes (image 8; series 5). No pleural effusion. Hepatobiliary: Liver is normal in size and contour. No focal lesion is identified. Gallbladder is  unremarkable. Pancreas: Unremarkable Spleen: Unremarkable Adrenals/Urinary Tract: Normal adrenal glands. There is a 1.9 cm cystic structure within the midpole of the right kidney with an internal density slightly above that  of simple fluid, potentially complicated cyst (image 23; series 3). Additional cysts are demonstrated within the kidneys bilaterally. Urinary bladder is unremarkable. Stomach/Bowel: Sigmoid colonic diverticulosis. No CT evidence for acute diverticulitis. Small hiatal hernia. Normal morphology of the stomach. Vascular/Lymphatic: Normal caliber abdominal aorta. Peripheral calcified atherosclerotic plaque. No retroperitoneal lymphadenopathy. Reproductive: Prior hysterectomy. Other: None. Musculoskeletal: Lower thoracic and lumbar spine degenerative changes. No aggressive or acute appearing osseous lesions. IMPRESSION: 1. Mild interval increase in size of masslike area within the medial right lower lobe with multiple focal areas of soft tissue attenuation. The lesion was previously biopsied 11/10/2015. Recommend follow-up as clinically indicated based on prior biopsy results and current treatment regime. 2. Interval development of a 1.5 cm subpleural nodular area of consolidation within the left lower lobe which may be infectious/inflammatory in etiology. Recommend follow-up chest CT in 6-8 weeks to assess for interval change/resolution. 3. Patchy ground-glass opacities within the lingula and left lower lobe which may be infectious/inflammatory in etiology. Recommend attention on follow-up. 4. No acute process within the abdomen or pelvis. 5. Probable mildly complicated cyst within the midpole of the right kidney. Recommend non acute bilateral renal ultrasound to confirm cystic nature of the lesion within the midpole right kidney. Electronically Signed   By: Lovey Newcomer M.D.   On: 05/28/2020 11:31    Procedures Procedures (including critical care time)  Medications Ordered in ED Medications -  No data to display  ED Course  I have reviewed the triage vital signs and the nursing notes.  Pertinent labs & imaging results that were available during my care of the patient were reviewed by me and considered in my medical decision making (see chart for details).    MDM Rules/Calculators/A&P                         Patient presents to the ED with complaints of vaginal bleeding.  She is nontoxic, mild tachycardia resolved on my assessment, BP elevated- low suspicion for HTN emergency. On exam her abdomen is nontender to palpation. GU exam without obvious active bleeding. Toilet tissue removed with some bright red blood- was sitting at the vaginal introitus/urethera area, not at the rectum. Speculum exam without blood present in vaginal vault. Plan for labs & CT imaging.   Additional history obtained:  Additional history obtained from chart review & nursing note review.. Previous records obtained and reviewed.   Lab Tests:  I Ordered, reviewed, and interpreted labs, which included:  CBC: No significant anemia, hemoglobin/hematocrit are at baseline.  No leukocytosis. CMP: Fairly unremarkable Urinalysis: Hematuria present.  Imaging Studies ordered:  I ordered imaging studies which included CT abdomen/pelvis with contrast, I independently visualized and interpreted imaging which showed: Findings of increase in size of masslike area in the right lower lobe as well as interval development of a 1.5 cm subpleural nodular area in the left lower lobe which could be infectious versus inflammatory in etiology. To some patchy groundglass opacities within the lingula and left lower lobe which may be infectious/inflammatory. No acute process in the abdomen/pelvis. She has a probably mild complicated cyst within the midpole of the right kidney. Overall recommendation for repeat chest CT in 6 to 8 weeks as well as a nonacute renal ultrasound.  She does mention that she has had a cough over the past couple  of weeks. Given her lower lobe findings of infection versus inflammation will trial doxycycline. Repeat chest CT as well as renal ultrasound per her  outpatient providers. In regards to her bleeding she experienced earlier today there was no obvious blood in the vaginal vault, around the rectum, or around the urethra upon visual inspection. Given the degree of bleeding she was reporting with no blood in the vaginal vault seems less likely to be from the vagina especially in a patient with a total hysterectomy. She has no complaints of blood in her stool, she follows a very closely for colonoscopies and is confident this is not the problem. She does have hematuria present, though she would benefit from outpatient urology follow-up for this. She is resting comfortably, no further significant bleeding, blood pressure improved, overall appears appropriate for discharge at this time.I discussed results, treatment plan, need for follow-up, and return precautions with the patient. Provided opportunity for questions, patient confirmed understanding and is in agreement with plan.   This is a shared visit with supervising physician Dr. Francia Greaves who has independently evaluated patient & provided guidance in evaluation/management/disposition, in agreement with care    Portions of this note were generated with Dragon dictation software. Dictation errors may occur despite best attempts at proofreading.  Final Clinical Impression(s) / ED Diagnoses Final diagnoses:  Hematuria, unspecified type  Pulmonary nodule  Kidney lesion    Rx / DC Orders ED Discharge Orders         Ordered    doxycycline (VIBRAMYCIN) 100 MG capsule  2 times daily        05/28/20 1254           Siris Hoos, [place] R, PA-C 05/28/20 1255    Valarie Merino, MD 05/29/20 [phone]

## 2020-05-30 DIAGNOSIS — R310 Gross hematuria: Secondary | ICD-10-CM | POA: Diagnosis not present

## 2020-06-01 ENCOUNTER — Other Ambulatory Visit (HOSPITAL_COMMUNITY): Payer: PPO

## 2020-06-01 DIAGNOSIS — C8511 Unspecified B-cell lymphoma, lymph nodes of head, face, and neck: Secondary | ICD-10-CM | POA: Diagnosis not present

## 2020-06-01 DIAGNOSIS — I10 Essential (primary) hypertension: Secondary | ICD-10-CM | POA: Diagnosis not present

## 2020-06-01 DIAGNOSIS — R7303 Prediabetes: Secondary | ICD-10-CM | POA: Diagnosis not present

## 2020-06-01 DIAGNOSIS — E785 Hyperlipidemia, unspecified: Secondary | ICD-10-CM | POA: Diagnosis not present

## 2020-06-02 ENCOUNTER — Ambulatory Visit (HOSPITAL_COMMUNITY): Payer: PPO

## 2020-06-08 ENCOUNTER — Telehealth: Payer: Self-pay | Admitting: *Deleted

## 2020-06-08 DIAGNOSIS — N939 Abnormal uterine and vaginal bleeding, unspecified: Secondary | ICD-10-CM | POA: Diagnosis not present

## 2020-06-08 DIAGNOSIS — R942 Abnormal results of pulmonary function studies: Secondary | ICD-10-CM

## 2020-06-08 NOTE — Telephone Encounter (Signed)
-----   Message from [place], MD sent at 06/01/2020  7:10 AM EDT ----- Regarding: CT and follow-up [name],  Patient recently had CT in the ED. We had ordered a HRCT but now I want it to be completed in 6 weeks. I would like to have her seen in follow-up afterwards.  Thanks, JE

## 2020-06-08 NOTE — Telephone Encounter (Signed)
Orders for CT have been corrected,  atc patient to schedule appointment for 11/23 unable to reach told to call back.   Will route to front desk pool to see if they are able to get patient scheduled.

## 2020-06-10 NOTE — Telephone Encounter (Signed)
Patient returned phone call We have her scheduled for 11/23 with Dr. Loanne Drilling, patient's CT is on 07/08/20. Nothing further needed at this time.

## 2020-06-10 NOTE — Telephone Encounter (Signed)
Attempted to call pt to r/s - no answer and no vm -pr

## 2020-06-14 DIAGNOSIS — E785 Hyperlipidemia, unspecified: Secondary | ICD-10-CM | POA: Diagnosis not present

## 2020-06-14 DIAGNOSIS — K219 Gastro-esophageal reflux disease without esophagitis: Secondary | ICD-10-CM | POA: Diagnosis not present

## 2020-06-14 DIAGNOSIS — C8511 Unspecified B-cell lymphoma, lymph nodes of head, face, and neck: Secondary | ICD-10-CM | POA: Diagnosis not present

## 2020-06-14 DIAGNOSIS — R7303 Prediabetes: Secondary | ICD-10-CM | POA: Diagnosis not present

## 2020-06-14 DIAGNOSIS — M3500 Sicca syndrome, unspecified: Secondary | ICD-10-CM | POA: Diagnosis not present

## 2020-06-14 DIAGNOSIS — I10 Essential (primary) hypertension: Secondary | ICD-10-CM | POA: Diagnosis not present

## 2020-06-14 DIAGNOSIS — Z0000 Encounter for general adult medical examination without abnormal findings: Secondary | ICD-10-CM | POA: Diagnosis not present

## 2020-06-27 ENCOUNTER — Other Ambulatory Visit: Payer: Self-pay | Admitting: Family Medicine

## 2020-06-27 DIAGNOSIS — E2839 Other primary ovarian failure: Secondary | ICD-10-CM

## 2020-06-28 ENCOUNTER — Other Ambulatory Visit (HOSPITAL_BASED_OUTPATIENT_CLINIC_OR_DEPARTMENT_OTHER): Payer: Self-pay | Admitting: Internal Medicine

## 2020-06-28 ENCOUNTER — Ambulatory Visit: Payer: PPO | Attending: Internal Medicine

## 2020-06-28 DIAGNOSIS — Z23 Encounter for immunization: Secondary | ICD-10-CM

## 2020-07-01 MED FILL — PFIZER-BIONTECH COVID-19 VA: 30 | 1 days supply | Qty: 0 | Fill #0

## 2020-07-08 ENCOUNTER — Ambulatory Visit (INDEPENDENT_AMBULATORY_CARE_PROVIDER_SITE_OTHER)
Admission: RE | Admit: 2020-07-08 | Discharge: 2020-07-08 | Disposition: A | Discharge status: Home / Self Care | Payer: PPO | Source: Ambulatory Visit | Attending: Pulmonary Disease | Admitting: Pulmonary Disease

## 2020-07-08 ENCOUNTER — Other Ambulatory Visit: Payer: Self-pay

## 2020-07-08 DIAGNOSIS — J181 Lobar pneumonia, unspecified organism: Secondary | ICD-10-CM | POA: Diagnosis not present

## 2020-07-08 DIAGNOSIS — J984 Other disorders of lung: Secondary | ICD-10-CM | POA: Diagnosis not present

## 2020-07-08 DIAGNOSIS — R942 Abnormal results of pulmonary function studies: Secondary | ICD-10-CM | POA: Diagnosis not present

## 2020-07-08 DIAGNOSIS — R911 Solitary pulmonary nodule: Secondary | ICD-10-CM | POA: Diagnosis not present

## 2020-07-08 DIAGNOSIS — I700 Atherosclerosis of aorta: Secondary | ICD-10-CM | POA: Diagnosis not present

## 2020-07-12 ENCOUNTER — Encounter: Payer: Self-pay | Admitting: Pulmonary Disease

## 2020-07-12 ENCOUNTER — Ambulatory Visit: Payer: PPO | Admitting: Pulmonary Disease

## 2020-07-12 ENCOUNTER — Other Ambulatory Visit: Payer: Self-pay

## 2020-07-12 VITALS — BP 144/66 | HR 93 | Temp 98.0°F | Wt 207.1 lb

## 2020-07-12 DIAGNOSIS — R942 Abnormal results of pulmonary function studies: Secondary | ICD-10-CM

## 2020-07-12 DIAGNOSIS — M546 Pain in thoracic spine: Secondary | ICD-10-CM | POA: Insufficient documentation

## 2020-07-12 DIAGNOSIS — M3500 Sicca syndrome, unspecified: Secondary | ICD-10-CM

## 2020-07-12 NOTE — Progress Notes (Signed)
Subjective:   PATIENT ID: [name] GENDER: female DOB: Mar 08, 1941, MRN: [id number]   HPI  Chief Complaint  Patient presents with  . Follow-up    review CT scan results.     Reason for Visit: Follow-up  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  Initially presented to Pulmonary clinic for back pain near her lungs >1 year that was worsening. Her oncologist obtained PET/CT which did not demonstrate any lung/chest abnormalities. Earlier this year, she was also evaluated by cardiology and underwent cardiac cath in April 2021 that did not demonstrate significant CAD.  Since our last visit, her back pain in unchanged/somewhat worse with intermittent aching pain. She has unchanged symptoms of Sjogren's including fatigue, dry eyes and dry mouth and generalized itching.   Denies unintentional weight loss, night sweats, fevers or chills. Denies any abnormal or new skin lesions. Denies shortness of breath, wheezing, chest pain. Reports mild chronic unproductive cough, unsure how long this has been going on. Denies family history of respiratory illness. Parents had colon cancer in their 12s who passed away from this. She is not familiar enough with her family history to know if anyone had lung or renal cancers.   Social History: Never smoker Works at [employer] exposures: Radiation  I have personally reviewed patient's past medical/family/social history/allergies/current medications.  Past Medical History:  Diagnosis Date  . Anxiety   . Arthritis    right thumb CMC arthritis  . Asthmatic bronchitis 2017  . Cancer ([place])    lymphoma  . Cataract    bilateral small- removed both eyes   . Colon cancer ([place])    stage 1- at appendiceal orifice   . Complication of anesthesia    has dry mouth anyway from shograns-sore  throats post op  . Coronary artery calcification seen on CAT scan 11/14/2019  . Depression    since 1982   . Diverticulosis   . Fibromyalgia   . GERD (gastroesophageal reflux disease)    past hx   . Heart murmur   . Hyperlipidemia    controlled - metabolic syndrome per pt- goes up and down   . Hypertension   . Lung infiltrate on CT 09/14/2015  . Meningioma ([place]) 11/2009   right post parietal 49mm - stable 11/2009 MRI  . Nephrolithiasis   . Nephrolithiasis   . Neuromuscular disorder (HCC)    fibromyalgia  . 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 Pure hypercholesterolemia 11/14/2019  . Sjogren's disease ([place])      Family History  Problem Relation Age of Onset  . Colon cancer Mother 74  . Colon cancer Father        unknown when onset was  . Diabetes Neg Hx   . Stomach cancer Neg Hx   . Colon polyps Neg Hx   . Esophageal cancer Neg Hx   . Rectal cancer Neg Hx      Social History   Occupational History  . Occupation: Retired    Fish farm manager: UNEMPLOYED  Tobacco Use  . Smoking status: Never Smoker  . Smokeless tobacco: Never Used  . Tobacco comment: Passive exposure through her mother.  Vaping Use  . Vaping Use: Never used  Substance and Sexual Activity  . Alcohol use: Yes    Alcohol/week: 0.0 standard drinks  Comment: Rarely  . Drug use: No  . Sexual activity: Not on file    Comment: [place] sister.    No Known Allergies   Outpatient Medications Prior to Visit  Medication Sig Dispense Refill  . acetaminophen (TYLENOL) 500 MG tablet Take 500 mg by mouth every 6 (six) hours as needed for moderate pain.    . Aspirin 81 MG CAPS Take 1 capsule by mouth daily.    . Calcium Carb-Cholecalciferol (CALCIUM 600-D PO) Take 1 tablet by mouth daily.    . Calcium Carbonate-Vitamin D (CALCIUM + D) 600-200 MG-UNIT TABS Take 1 tablet by mouth daily.     . Cholecalciferol (VITAMIN D3) 50 MCG (2000 UT) TABS Take 2 tablets by mouth daily.    Marland Kitchen  doxycycline (VIBRAMYCIN) 100 MG capsule Take 1 capsule (100 mg total) by mouth 2 (two) times daily. 14 capsule 0  . Magnesium 250 MG TABS Take 2 tablets by mouth daily.    . Potassium 99 MG TABS Take 2 tablets by mouth daily.    . simvastatin (ZOCOR) 20 MG tablet Take 20 mg by mouth at bedtime.     . vitamin B-12 (CYANOCOBALAMIN) 1000 MCG tablet Take 1,000 mcg by mouth as needed.      No facility-administered medications prior to visit.    Review of Systems  Constitutional: Negative for chills, diaphoresis, fever, malaise/fatigue and weight loss.  HENT: Negative for congestion, ear pain and sore throat.   Respiratory: Negative for cough, hemoptysis, sputum production, shortness of breath and wheezing.   Cardiovascular: Positive for chest pain. Negative for palpitations and leg swelling.  Gastrointestinal: Negative for abdominal pain, heartburn and nausea.  Genitourinary: Negative for frequency.  Musculoskeletal: Positive for back pain. Negative for joint pain and myalgias.  Skin: Negative for itching and rash.  Neurological: Negative for dizziness, weakness and headaches.  Endo/Heme/Allergies: Does not bruise/bleed easily.  Psychiatric/Behavioral: Negative for depression. The patient is not nervous/anxious.      Objective:   Vitals:   07/12/20 1529  BP: (!) 144/66  Pulse: 93  Temp: 98 F (36.7 C)  TempSrc: Tympanic  SpO2: 96%  Weight: 207 lb 2 oz (94 kg)   SpO2: 96 %  Physical Exam: General: Well-appearing, no acute distress HENT: [place], AT Eyes: EOMI, no scleral icterus Respiratory: Clear to auscultation bilaterally.  No crackles, wheezing or rales Cardiovascular: RRR, -M/R/G, no JVD Extremities:-Edema,-tenderness Neuro: AAO x4, CNII-XII grossly intact Skin: Intact, no rashes or bruising. Face, neck and upper extremities with no evidence of follicular or abnormal skin findings. Scattered actinic keratosis on examined regions. Psych: Normal mood, normal affect  Data  Reviewed:  Imaging: PET/CT 02/06/18 - Negative for hypermetabolic activity. Thin-walled cysts in mid and lower lungs bilaterally.  CT Chest 07/08/20 - Basilar predominance of thin-walled cysts of varying sizes. Some cysts with calcifications.  PFT: None on file  Labs: 10/18/15 ANA 1:1280 SSA >8 SSB>8 CCP neg  Echo: 12/08/19 EF 70-75%. Age-related DD. No WMA or valvular abnormalities.  Cardiac cath 12/01/19 - No CAD    Imaging, labs and test noted above have been reviewed independently by me.  Assessment & Plan:   Discussion: 79 year old female with history of B-cell lymphoma without recurrence and Sjogren's syndrome who presents for follow-up. Reviewed imaging with patient. Cystic lesions of varying sizes with basilar predominance noted. I also internally consulted with the [facility] providers regarding her imaging. Her cystic lung disease is likely related to her known Sjogren's. Differential also  includes Birt-Hogg-Dube syndrome, pulmonary langerhans (though patient is non-smoker so less likely) and LAM. However these syndromes usually present earlier with increased risk of malignancy. At this point, patient has expressed low interest in extensive work-up especially if management does not change overall clinical course. Her chief complaint remains her back pain.  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 Influenza Split 05/15/2012, 05/21/2015  . Influenza Whole 05/21/2007, 05/19/2008, 06/01/2010  . PFIZER SARS-COV-2 Vaccination 10/03/2019, 10/25/2019, 06/28/2020  . Pneumococcal Polysaccharide-23 08/20/1998  . Td 04/29/2006  . Zoster 06/23/2007   CT Lung Screen - not qualified  No orders of  the defined types were placed in this encounter. No orders of the defined types were placed in this encounter.  No follow-ups on file.  I have spent a total time of 45-minutes on the day of the appointment reviewing prior documentation, coordinating care and discussing medical diagnosis and plan with the patient/family. Imaging, labs and tests included in this note have been reviewed and interpreted independently by me.  [place], MD [facility] 07/12/2020 3:33 PM  Office Number [phone]

## 2020-07-12 NOTE — Patient Instructions (Signed)
Cystic Lung Disease --We will discuss your chest imaging within our Pulmonary group --Will schedule follow-up in 3 months

## 2020-07-13 ENCOUNTER — Telehealth: Payer: Self-pay | Admitting: *Deleted

## 2020-07-13 NOTE — Telephone Encounter (Signed)
ATC patient unable to reach LM to call back office (x1)  

## 2020-07-13 NOTE — Telephone Encounter (Signed)
-----   Message from [place], MD sent at 07/12/2020  9:50 PM EST ----- Please call patient and let her know the follow:I have discussed the results of your CT scan with partners at [facility] and we reached the consensus that this is likely related to your Sjogren's. The only additional testing recommended at this time if Pulmonary function tests. We will schedule this test in 2-3 months and you can follow-up with me afterwards to discuss results.

## 2020-07-19 ENCOUNTER — Encounter: Payer: Self-pay | Admitting: *Deleted

## 2020-07-19 NOTE — Telephone Encounter (Signed)
atc patient again Recall was placed  Protocol is if unable to reach patient after 2 attempts to send a letter.   Letter being sent to patient. Closing encounter. Will route to [place] as FYI

## 2020-07-22 ENCOUNTER — Telehealth: Payer: Self-pay | Admitting: Pulmonary Disease

## 2020-07-22 NOTE — Telephone Encounter (Signed)
I attempted to contact patient again regarding CT results. Left callback number to discuss plan. If she calls office please provide the following message:  I have discussed the results of your CT scan with partners at [facility] and we reached the consensus that this is likely related to your Sjogren's. The only additional testing recommended at this time if Pulmonary function tests. We will schedule this test in 2-3 months and you can follow-up with me afterwards to discuss results.  Rodman Pickle, M.D. [facility] Pulmonary/Critical Care Medicine 07/22/2020 2:01 PM

## 2020-07-22 NOTE — Telephone Encounter (Signed)
Spoke with patient and let her know recs of Dr. Loanne Drilling  I have discussed the results of your CT scan with partners at [facility] and we reached the consensus that this is likely related to your Sjogren's. The only additional testing recommended at this time if Pulmonary function tests. We will schedule this test in 2-3 months and you can follow-up with me afterwards to discuss results.  Rodman Pickle, M.D. [facility] Pulmonary/Critical Care Medicine 07/22/2020 2:01 PM   Patient is now scheduled for PFT on 3/15 at [place] patient that Dr. Cordelia Pen schedule is not out that far and she would have to call back to get OV scheduled. Patient expressed understanding. Nothing further needed at this time.

## 2020-08-01 DIAGNOSIS — L821 Other seborrheic keratosis: Secondary | ICD-10-CM | POA: Diagnosis not present

## 2020-08-01 DIAGNOSIS — L853 Xerosis cutis: Secondary | ICD-10-CM | POA: Diagnosis not present

## 2020-08-01 DIAGNOSIS — Z85828 Personal history of other malignant neoplasm of skin: Secondary | ICD-10-CM | POA: Diagnosis not present

## 2020-08-01 DIAGNOSIS — L814 Other melanin hyperpigmentation: Secondary | ICD-10-CM | POA: Diagnosis not present

## 2020-10-10 ENCOUNTER — Ambulatory Visit
Admission: RE | Admit: 2020-10-10 | Discharge: 2020-10-10 | Disposition: A | Discharge status: Home / Self Care | Payer: PPO | Source: Ambulatory Visit | Attending: Family Medicine | Admitting: Family Medicine

## 2020-10-10 ENCOUNTER — Other Ambulatory Visit: Payer: PPO

## 2020-10-10 ENCOUNTER — Other Ambulatory Visit: Payer: Self-pay

## 2020-10-10 DIAGNOSIS — Z780 Asymptomatic menopausal state: Secondary | ICD-10-CM | POA: Diagnosis not present

## 2020-10-10 DIAGNOSIS — M85832 Other specified disorders of bone density and structure, left forearm: Secondary | ICD-10-CM | POA: Diagnosis not present

## 2020-10-10 DIAGNOSIS — E2839 Other primary ovarian failure: Secondary | ICD-10-CM

## 2020-10-21 DIAGNOSIS — H26493 Other secondary cataract, bilateral: Secondary | ICD-10-CM | POA: Diagnosis not present

## 2020-10-21 DIAGNOSIS — Z961 Presence of intraocular lens: Secondary | ICD-10-CM | POA: Diagnosis not present

## 2020-10-21 DIAGNOSIS — H04123 Dry eye syndrome of bilateral lacrimal glands: Secondary | ICD-10-CM | POA: Diagnosis not present

## 2020-10-27 ENCOUNTER — Other Ambulatory Visit: Payer: Self-pay | Admitting: *Deleted

## 2020-10-27 DIAGNOSIS — M3500 Sicca syndrome, unspecified: Secondary | ICD-10-CM

## 2020-10-29 ENCOUNTER — Other Ambulatory Visit (HOSPITAL_COMMUNITY)
Admission: RE | Admit: 2020-10-29 | Discharge: 2020-10-29 | Disposition: A | Discharge status: Home / Self Care | Payer: PPO | Source: Ambulatory Visit | Attending: Pulmonary Disease | Admitting: Pulmonary Disease

## 2020-10-29 DIAGNOSIS — Z20822 Contact with and (suspected) exposure to covid-19: Secondary | ICD-10-CM | POA: Insufficient documentation

## 2020-10-29 DIAGNOSIS — Z01812 Encounter for preprocedural laboratory examination: Secondary | ICD-10-CM | POA: Diagnosis not present

## 2020-10-29 LAB — SARS CORONAVIRUS 2 (TAT 6-24 HRS): SARS Coronavirus 2: NEGATIVE

## 2020-11-01 ENCOUNTER — Other Ambulatory Visit: Payer: Self-pay

## 2020-11-01 ENCOUNTER — Ambulatory Visit (INDEPENDENT_AMBULATORY_CARE_PROVIDER_SITE_OTHER): Payer: PPO | Admitting: Pulmonary Disease

## 2020-11-01 DIAGNOSIS — M3500 Sicca syndrome, unspecified: Secondary | ICD-10-CM

## 2020-11-01 LAB — PULMONARY FUNCTION TEST
DL/VA % pred: 101 %
DL/VA: 3.99 ml/min/mmHg/L
DLCO cor % pred: 71 %
DLCO cor: 15.82 ml/min/mmHg
DLCO unc % pred: 71 %
DLCO unc: 15.82 ml/min/mmHg
FEF 25-75 Post: 2.26 L/sec
FEF 25-75 Pre: 1.3 L/sec
FEF2575-%Change-Post: 73 %
FEF2575-%Pred-Post: 128 %
FEF2575-%Pred-Pre: 73 %
FEV1-%Change-Post: 13 %
FEV1-%Pred-Post: 71 %
FEV1-%Pred-Pre: 62 %
FEV1-Post: 1.75 L
FEV1-Pre: 1.54 L
FEV1FVC-%Change-Post: 1 %
FEV1FVC-%Pred-Pre: 104 %
FEV6-%Change-Post: 12 %
FEV6-%Pred-Post: 71 %
FEV6-%Pred-Pre: 63 %
FEV6-Post: 2.23 L
FEV6-Pre: 1.98 L
FEV6FVC-%Pred-Post: 105 %
FEV6FVC-%Pred-Pre: 105 %
FVC-%Change-Post: 12 %
FVC-%Pred-Post: 67 %
FVC-%Pred-Pre: 60 %
FVC-Post: 2.23 L
FVC-Pre: 1.99 L
Post FEV1/FVC ratio: 79 %
Post FEV6/FVC ratio: 100 %
Pre FEV1/FVC ratio: 78 %
Pre FEV6/FVC Ratio: 100 %
RV % pred: 155 %
RV: 4.06 L
TLC % pred: 109 %
TLC: 6.33 L

## 2020-11-01 NOTE — Patient Instructions (Signed)
Pulmonary function test performed 

## 2020-11-01 NOTE — Progress Notes (Signed)
Full PFT performed today. °

## 2020-11-03 DIAGNOSIS — M25552 Pain in left hip: Secondary | ICD-10-CM | POA: Diagnosis not present

## 2020-11-03 DIAGNOSIS — R0781 Pleurodynia: Secondary | ICD-10-CM | POA: Diagnosis not present

## 2020-11-03 DIAGNOSIS — R1012 Left upper quadrant pain: Secondary | ICD-10-CM | POA: Diagnosis not present

## 2020-11-07 ENCOUNTER — Other Ambulatory Visit: Payer: Self-pay | Admitting: Family Medicine

## 2020-11-07 ENCOUNTER — Encounter: Payer: Self-pay | Admitting: Pulmonary Disease

## 2020-11-07 ENCOUNTER — Other Ambulatory Visit: Payer: Self-pay

## 2020-11-07 ENCOUNTER — Ambulatory Visit: Payer: PPO | Admitting: Pulmonary Disease

## 2020-11-07 VITALS — BP 134/82 | HR 92 | Temp 97.4°F | Ht 69.0 in | Wt 208.4 lb

## 2020-11-07 DIAGNOSIS — J449 Chronic obstructive pulmonary disease, unspecified: Secondary | ICD-10-CM | POA: Diagnosis not present

## 2020-11-07 DIAGNOSIS — J984 Other disorders of lung: Secondary | ICD-10-CM | POA: Insufficient documentation

## 2020-11-07 DIAGNOSIS — R9389 Abnormal findings on diagnostic imaging of other specified body structures: Secondary | ICD-10-CM | POA: Diagnosis not present

## 2020-11-07 DIAGNOSIS — R1012 Left upper quadrant pain: Secondary | ICD-10-CM

## 2020-11-07 NOTE — Patient Instructions (Addendum)
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 No treatment at this point --Will arrange for CT Chest in 06/2021 and follow-up with me --If you develop symptoms of persistence shortness of breath, wheezing or cough in the absence of illness, please contact our office for evaluation. We will consider starting bronchodilators  Follow-up with me after CT Chest

## 2020-11-07 NOTE — Progress Notes (Signed)
Subjective:   PATIENT ID: [name] GENDER: female DOB: 07-01-1941, MRN: [id number]   HPI  Chief Complaint  Patient presents with  . Follow-up    3 mo f/u after PFT. Completed PFT on 11/01/20. States she has been stable since last visit.     Reason for Visit: Follow-up  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  She completed PFTs on 11/01/20. No respiratory complaints. She denies shortness of breath or wheezing. She previously had a cough that has now resolved. Has dry mouth, dry eyes and fatigue related to Sjogren's.  Social History: Never smoker Works at [employer] exposures: Radiation  I have personally reviewed patient's past medical/family/social history/allergies/current medications.  Past Medical History:  Diagnosis Date  . Anxiety   . Arthritis    right thumb CMC arthritis  . Asthmatic bronchitis 2017  . Cancer ([place])    lymphoma  . Cataract    bilateral small- removed both eyes   . Colon cancer ([place])    stage 1- at appendiceal orifice   . Complication of anesthesia    has dry mouth anyway from shograns-sore throats post op  . Coronary artery calcification seen on CAT scan 11/14/2019  . Depression    since 1982   . Diverticulosis   . Fibromyalgia   . GERD (gastroesophageal reflux disease)    past hx   . Heart murmur   . Hyperlipidemia    controlled - metabolic syndrome per pt- goes up and down   . Hypertension   . Lung infiltrate on CT 09/14/2015  . Meningioma ([place]) 11/2009   right post parietal 39mm - stable 11/2009 MRI  . Nephrolithiasis   . Nephrolithiasis   . Neuromuscular disorder (HCC)    fibromyalgia  . 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 Pure hypercholesterolemia 11/14/2019  . Sjogren's disease ([place])    No Known Allergies    Outpatient Medications Prior to Visit  Medication Sig Dispense Refill  . acetaminophen (TYLENOL) 500 MG tablet Take 500 mg by mouth every 6 (six) hours as needed for moderate pain.    . Calcium Carb-Cholecalciferol (CALCIUM 600-D PO) Take 1 tablet by mouth daily.    . Calcium Carbonate-Vitamin D (CALCIUM + D) 600-200 MG-UNIT TABS Take 1 tablet by mouth daily.     . Cholecalciferol (VITAMIN D3) 50 MCG (2000 UT) TABS Take 2 tablets by mouth daily.    . Magnesium 250 MG TABS Take 2 tablets by mouth daily.    . Potassium 99 MG TABS Take 2 tablets by mouth daily.    . simvastatin (ZOCOR) 20 MG tablet Take 20 mg by mouth at bedtime.     . vitamin B-12 (CYANOCOBALAMIN) 1000 MCG tablet Take 1,000 mcg by mouth as needed.      No facility-administered medications prior to visit.    Review of Systems  Constitutional: Negative for chills, diaphoresis, fever, malaise/fatigue and weight loss.  HENT: Negative for congestion.   Respiratory: Negative for cough, hemoptysis, sputum production, shortness of breath and wheezing.   Cardiovascular: Negative for chest pain, palpitations and leg swelling.     Objective:   Vitals:   11/07/20 1451  BP: 134/82  Pulse: 92  Temp: (!) 97.4 F (36.3 C)  TempSrc: Temporal  SpO2: 96%  Weight: 208  lb 6.4 oz (94.5 kg)  Height: 5\' 9"  (1.753 m)      Physical Exam: General: Well-appearing, no acute distress HENT: [place], AT Eyes: EOMI, no scleral icterus Respiratory: Clear to auscultation bilaterally.  No crackles, wheezing or rales Cardiovascular: RRR, -M/R/G, no JVD Neuro: AAO x4, CNII-XII grossly intact Psych: Normal mood, normal affect  Data Reviewed:  Imaging: PET/CT 02/06/18 - Negative for hypermetabolic activity. Thin-walled cysts in mid and lower lungs bilaterally.  CT Chest 07/08/20 - Basilar predominance of thin-walled cysts of varying sizes. Some cysts with calcifications.  PFT: 11/01/20 FVC 2.23 (67%) FEV1 1.75 (71%) Ratio 78  TLC 109% DLCO  71%. FVC increased by 12% and FEV1 increased by 13% Interpretation: Reversible obstructive defect present on spirometry. Increased RV and RV/TLC consistent with air trapping. Mildly reduced gas exchange  Labs: 10/18/15 ANA 1:1280 SSA >8 SSB>8 CCP neg  Echo: 12/08/19 EF 70-75%. Age-related DD. No WMA or valvular abnormalities.  Cardiac cath 12/01/19 - No CAD  Imaging, labs and test noted above have been reviewed independently by me.  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 PFTs reviewed which demonstrate findings consistent with reversible obstructive defect and reduced gas exchange. We discussed potential benefit of bronchodilators in the future if she does become symptomatic. For her cystic lung disease, will need to monitor annually for progression.  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 No treatment at this point --Will arrange for CT Chest in 06/2021 and follow-up with me --If you develop symptoms of persistence shortness of breath, wheezing or cough in the absence of illness, please contact our office for evaluation. We will consider starting bronchodilators  Health Maintenance Immunization History  Administered Date(s) Administered  . Influenza Split 05/15/2012, 05/21/2015  . Influenza Whole 05/21/2007, 05/19/2008, 06/01/2010  . Influenza,inj,Quad PF,6+ Mos 05/01/2016, 05/10/2017, 05/13/2018, 05/07/2019, 05/12/2020  . PFIZER([place])SARS-COV-2 Vaccination 10/03/2019, 10/25/2019, 06/28/2020  . Pneumococcal Conjugate-13 03/01/2015  . Pneumococcal Polysaccharide-23 08/20/1998, 02/22/1999  . Td 04/29/2006  . Zoster 06/23/2007   CT Lung Screen - not qualified  Orders Placed This Encounter  Procedures  . CT Chest Wo Contrast    Standing Status:   Future    Standing  Expiration Date:   11/07/2021    Scheduling Instructions:     Per JE, to be done in November 2022.    Order Specific Question:   Preferred imaging location?    Answer:   [id number]  No orders of the defined types were placed in this encounter.  Return in about 8 months (around 07/10/2021).  I have spent a total time of 32-minutes on the day of the appointment reviewing prior documentation, coordinating care and discussing medical diagnosis and plan with the patient/family. Imaging, labs and tests included in this note have been reviewed and interpreted independently by me.  Kinzlie Harney Rodman Pickle, MD [place] [facility] Critical [facility] 11/07/2020 12:23 PM  Office Number [phone]

## 2020-11-08 ENCOUNTER — Other Ambulatory Visit: Payer: Self-pay | Admitting: Family Medicine

## 2020-11-08 ENCOUNTER — Ambulatory Visit
Admission: RE | Admit: 2020-11-08 | Discharge: 2020-11-08 | Disposition: A | Discharge status: Home / Self Care | Payer: PPO | Source: Ambulatory Visit | Attending: Family Medicine | Admitting: Family Medicine

## 2020-11-08 DIAGNOSIS — R0781 Pleurodynia: Secondary | ICD-10-CM | POA: Diagnosis not present

## 2020-11-25 DIAGNOSIS — H9201 Otalgia, right ear: Secondary | ICD-10-CM | POA: Diagnosis not present

## 2020-11-29 ENCOUNTER — Ambulatory Visit
Admission: RE | Admit: 2020-11-29 | Discharge: 2020-11-29 | Disposition: A | Discharge status: Home / Self Care | Payer: PPO | Source: Ambulatory Visit | Attending: Family Medicine | Admitting: Family Medicine

## 2020-11-29 DIAGNOSIS — I700 Atherosclerosis of aorta: Secondary | ICD-10-CM | POA: Diagnosis not present

## 2020-11-29 DIAGNOSIS — R1012 Left upper quadrant pain: Secondary | ICD-10-CM

## 2020-11-29 DIAGNOSIS — K760 Fatty (change of) liver, not elsewhere classified: Secondary | ICD-10-CM | POA: Diagnosis not present

## 2020-11-29 DIAGNOSIS — N281 Cyst of kidney, acquired: Secondary | ICD-10-CM | POA: Diagnosis not present

## 2020-11-29 DIAGNOSIS — Z8572 Personal history of non-Hodgkin lymphomas: Secondary | ICD-10-CM | POA: Diagnosis not present

## 2020-12-05 DIAGNOSIS — M940 Chondrocostal junction syndrome [Tietze]: Secondary | ICD-10-CM | POA: Diagnosis not present

## 2020-12-07 ENCOUNTER — Ambulatory Visit (INDEPENDENT_AMBULATORY_CARE_PROVIDER_SITE_OTHER): Payer: PPO

## 2020-12-07 ENCOUNTER — Ambulatory Visit: Payer: PPO | Admitting: Specialist

## 2020-12-07 ENCOUNTER — Encounter: Payer: Self-pay | Admitting: Specialist

## 2020-12-07 ENCOUNTER — Other Ambulatory Visit: Payer: Self-pay

## 2020-12-07 VITALS — BP 155/82 | HR 89 | Ht 69.0 in | Wt 209.0 lb

## 2020-12-07 DIAGNOSIS — M545 Low back pain, unspecified: Secondary | ICD-10-CM

## 2020-12-07 DIAGNOSIS — M5136 Other intervertebral disc degeneration, lumbar region: Secondary | ICD-10-CM | POA: Diagnosis not present

## 2020-12-07 DIAGNOSIS — M7061 Trochanteric bursitis, right hip: Secondary | ICD-10-CM | POA: Diagnosis not present

## 2020-12-07 MED ORDER — METHYLPREDNISOLONE 4 MG PO TBPK: ORAL_TABLET | ORAL | 0 refills | Status: DC

## 2020-12-07 NOTE — Progress Notes (Signed)
Office Visit Note   Patient: [name]           Date of Birth: 11-09-1940           MRN: [id number] Visit Date: 12/07/2020              Requested by: Maurice Small, MD [place],  [place] [id number] PCP: Maurice Small, MD   Assessment & Plan: Visit Diagnoses:  1. Low back pain, unspecified back pain laterality, unspecified chronicity, unspecified whether sciatica present   2. Degenerative disc disease, lumbar   3. Greater trochanteric bursitis of right hip     Plan:Avoid frequent bending and stooping  No lifting greater than 10 lbs. May use ice or moist heat for pain. Weight loss is of benefit. Best medication for lumbar disc disease is arthritis medications like motrin, celebrex and naprosyn due to recent GI bleeding you should not take these medications instead we with try a steroid dose pak.  Exercise is important to improve your indurance and does allow people to function better inspite of back pain.  Physical therapy for treatment of left hip bursitis and lumbar degenerative disc disease.  Follow-Up Instructions: Return in about 4 weeks (around 01/04/2021).   Orders:  Orders Placed This Encounter  Procedures  . XR Lumbar Spine 2-3 Views  . Ambulatory referral to Physical Therapy   Meds ordered this encounter  Medications  . methylPREDNISolone (MEDROL DOSEPAK) 4 MG TBPK tablet    Sig: Take as directed 6 day dose pak.    Dispense:  21 tablet    Refill:  0      Procedures: No procedures performed   Clinical Data: No additional findings.   Subjective: Chief Complaint  Patient presents with  . Lower Back - Pain  . Left Leg - Pain    80 year old female with recent history of increasing left anterolateral thigh and leg pain. Was on the phone and  Talking with a friend last evening and was having a sharp pain into the left anterior thigh into the left knee. No horrible pain into the left knee. There is left lower back pain and  it is catching on the right lower back. History of  sjogrens syndrome and is concerned that this may be the source of her pain.    Review of Systems  Constitutional: Negative.   HENT: Negative.   Eyes: Negative.   Respiratory: Negative.   Cardiovascular: Negative.   Gastrointestinal: Negative.   Endocrine: Negative.   Genitourinary: Negative.   Musculoskeletal: Negative.   Skin: Negative.   Allergic/Immunologic: Negative.   Neurological: Negative.   Hematological: Negative.   Psychiatric/Behavioral: Negative.      Objective: Vital Signs: BP (!) 155/82 (BP Location: Left Arm, Patient Position: Sitting)   Pulse 89   Ht 5\' 9"  (1.753 m)   Wt 209 lb (94.8 kg)   BMI 30.86 kg/m   Physical Exam Constitutional:      Appearance: She is well-developed.  HENT:     Head: Normocephalic and atraumatic.  Eyes:     Pupils: Pupils are equal, round, and reactive to light.  Pulmonary:     Effort: Pulmonary effort is normal.     Breath sounds: Normal breath sounds.  Abdominal:     General: Bowel sounds are normal.     Palpations: Abdomen is soft.  Musculoskeletal:        General: Normal range of motion.     Cervical  back: Normal range of motion and neck supple.  Skin:    General: Skin is warm and dry.  Neurological:     Mental Status: She is alert and oriented to person, place, and time.  Psychiatric:        Behavior: Behavior normal.        Thought Content: Thought content normal.        Judgment: Judgment normal.     Ortho Exam  Specialty Comments:  No specialty comments available.  Imaging: XR Lumbar Spine 2-3 Views  Result Date: 12/07/2020 Lumbar radiographs AP and lateral flexion and extension views show vacuum sign L5-S1 with degenerative disc narrowing of nearly every level of the lumbar spine. No acute changes.    PMFS History: Patient Active Problem List   Diagnosis Date Noted  . Cystic-bullous disease of lung 11/07/2020  . Obstructive lung disease  (generalized) ([place]) 11/07/2020  . Abnormal lung scan 07/12/2020  . Left-sided thoracic back pain 07/12/2020  . Multiple complaints 02/09/2020  . Coronary artery calcification 11/14/2019  . Pure hypercholesterolemia 11/14/2019  . Skin rash 02/14/2016  . Left flank pain 02/14/2016  . Hypersensitivity reaction 12/02/2015  . Essential hypertension 09/15/2015  . Pulmonary nodule 09/13/2015  . History of colon cancer, stage I 08/31/2015  . 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 H/O superficial parotidectomy 08/01/2015  . Other abnormal glucose 01/23/2013  . Left-sided headache 01/15/2013  . Chronic diarrhea 05/15/2012  . Bruising 01/25/2012  . Palpitations 11/12/2011  . Atypical chest pain 11/12/2011  . Preventative health care 05/15/2011  . Angina pectoris ([place]) 02/02/2010  . GERD 12/26/2009  . CONTACT DERMATITIS 09/02/2007  . DE QUERVAIN'S TENOSYNOVITIS 09/02/2007  . MENINGIOMA 07/22/2007  . DIVERTICULOSIS, COLON 07/22/2007  . FIBROMYALGIA 07/22/2007  . HYPERLIPIDEMIA 07/21/2007  . [place] SYNDROME 07/21/2007   Past Medical History:  Diagnosis Date  . Anxiety   . Arthritis    right thumb CMC arthritis  . Asthmatic bronchitis 2017  . Cancer ([place])    lymphoma  . Cataract    bilateral small- removed both eyes   . Colon cancer ([place])    stage 1- at appendiceal orifice   . Complication of anesthesia    has dry mouth anyway from shograns-sore throats post op  . Coronary artery calcification seen on CAT scan 11/14/2019  . Depression    since 1982   . Diverticulosis   . Fibromyalgia   . GERD (gastroesophageal reflux disease)    past hx   . Heart murmur   . Hyperlipidemia    controlled - metabolic syndrome per pt- goes up and down   . Hypertension   . Lung infiltrate on CT 09/14/2015  . Meningioma ([place]) 11/2009   right post parietal 13mm - stable 11/2009 MRI  . Nephrolithiasis   . Nephrolithiasis   . Neuromuscular disorder (HCC)     fibromyalgia  . 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 Pure hypercholesterolemia 11/14/2019  . Sjogren's disease ([place])     Family History  Problem Relation Age of Onset  . Colon cancer Mother 25  . Colon cancer Father        unknown when onset was  . Diabetes Neg Hx   . Stomach cancer Neg Hx   . Colon polyps Neg Hx   . Esophageal cancer Neg Hx   . Rectal cancer Neg Hx     Past Surgical History:  Procedure Laterality Date  .  ABDOMINAL HYSTERECTOMY    . APPENDECTOMY  2008  . ARTERY BIOPSY  02/03/2013   Procedure: MINOR BIOPSY TEMPORAL ARTERY;  Surgeon: Ascencion Dike, MD;  Location: [facility];  Service: ENT;;  . COLONOSCOPY    . [place]  . LEFT HEART CATH AND CORONARY ANGIOGRAPHY N/A 12/01/2019   Procedure: LEFT HEART CATH AND CORONARY ANGIOGRAPHY;  Surgeon: Troy Sine, MD;  Location: [facility];  Service: Cardiovascular;  Laterality: N/A;  . MASS EXCISION  2008   abd-benign  . PAROTIDECTOMY Right 08/01/2015   Procedure: RIGHT PAROTIDECTOMY;  Surgeon: [name], MD;  Location: [facility];  Service: ENT;  Laterality: Right;  . POLYPECTOMY    . TONSILLECTOMY    . VAGINAL HYSTERECTOMY  1989   Social History   Occupational History  . Occupation: Retired    Fish farm manager: UNEMPLOYED  Tobacco Use  . Smoking status: Never Smoker  . Smokeless tobacco: Never Used  . Tobacco comment: Passive exposure through her mother.  Vaping Use  . Vaping Use: Never used  Substance and Sexual Activity  . Alcohol use: Yes    Alcohol/week: 0.0 standard drinks    Comment: Rarely  . Drug use: No  . Sexual activity: Not on file    Comment: [place] sister.

## 2020-12-07 NOTE — Patient Instructions (Addendum)
Avoid frequent bending and stooping  No lifting greater than 10 lbs. May use ice or moist heat for pain. Weight loss is of benefit. Best medication for lumbar disc disease is arthritis medications like motrin, celebrex and naprosyn due to recent GI bleeding you should not take these medications instead we with try a steroid dose pak.  Exercise is important to improve your indurance and does allow people to function better inspite of back pain.  Physical therapy for treatment of left hip bursitis and lumbar degenerative disc disease.

## 2020-12-22 ENCOUNTER — Ambulatory Visit: Payer: PPO | Attending: Specialist | Admitting: Physical Therapy

## 2020-12-22 ENCOUNTER — Encounter: Payer: Self-pay | Admitting: Physical Therapy

## 2020-12-22 ENCOUNTER — Other Ambulatory Visit: Payer: Self-pay

## 2020-12-22 DIAGNOSIS — M545 Low back pain, unspecified: Secondary | ICD-10-CM | POA: Insufficient documentation

## 2020-12-22 DIAGNOSIS — M6281 Muscle weakness (generalized): Secondary | ICD-10-CM | POA: Diagnosis not present

## 2020-12-22 DIAGNOSIS — R2689 Other abnormalities of gait and mobility: Secondary | ICD-10-CM | POA: Insufficient documentation

## 2020-12-22 DIAGNOSIS — M62838 Other muscle spasm: Secondary | ICD-10-CM | POA: Diagnosis not present

## 2020-12-22 NOTE — Patient Instructions (Signed)
Access Code: [email or web address] URL: [email or web address] Date: 12/22/2020 Prepared by: [place]  Exercises Heel rises with counter support - 3 x daily - 7 x weekly - 10 reps Standing Hip Flexion AROM - 2 x daily - 7 x weekly - 10 reps   [facility] [address] [place], [place] [address] Phone # [phone] Fax [phone]

## 2020-12-22 NOTE — Therapy (Signed)
[facility] [address] [place], [place], [address] Phone: [phone]   Fax:  [phone]  Physical Therapy Evaluation  Patient Details  Name: CHRISTYANA CORWIN MRN: [id number] Date of Birth: [birth year] Referring Provider (PT): Basil Dess, MD   Encounter Date: 12/22/2020   PT End of Session - 12/22/20 1553    Visit Number 1    Date for PT Re-Evaluation 02/03/21    Authorization Type [employer]    Authorization Time Period 12/22/20 to 02/02/21    PT Start Time 0928    PT Stop Time 1012    PT Time Calculation (min) 44 min    Activity Tolerance Patient tolerated treatment well;No increased pain           Past Medical History:  Diagnosis Date  . Anxiety   . Arthritis    right thumb CMC arthritis  . Asthmatic bronchitis 2017  . Cancer ([place])    lymphoma  . Cataract    bilateral small- removed both eyes   . Colon cancer ([place])    stage 1- at appendiceal orifice   . Complication of anesthesia    has dry mouth anyway from shograns-sore throats post op  . Coronary artery calcification seen on CAT scan 11/14/2019  . Depression    since 1982   . Diverticulosis   . Fibromyalgia   . GERD (gastroesophageal reflux disease)    past hx   . Heart murmur   . Hyperlipidemia    controlled - metabolic syndrome per pt- goes up and down   . Hypertension   . Lung infiltrate on CT 09/14/2015  . Meningioma ([place]) 11/2009   right post parietal 55mm - stable 11/2009 MRI  . Nephrolithiasis   . Nephrolithiasis   . Neuromuscular disorder (HCC)    fibromyalgia  . 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 Pure hypercholesterolemia 11/14/2019  . Sjogren's disease [facility])     Past Surgical History:  Procedure Laterality Date  . ABDOMINAL HYSTERECTOMY    . APPENDECTOMY  2008  . ARTERY BIOPSY  02/03/2013   Procedure: MINOR BIOPSY TEMPORAL ARTERY;  Surgeon: Ascencion Dike, MD;  Location: [facility];  Service: ENT;;  . COLONOSCOPY    . [place]  . LEFT HEART CATH AND CORONARY ANGIOGRAPHY N/A 12/01/2019   Procedure: LEFT HEART CATH AND CORONARY ANGIOGRAPHY;  Surgeon: Troy Sine, MD;  Location: [facility];  Service: Cardiovascular;  Laterality: N/A;  . MASS EXCISION  2008   abd-benign  . PAROTIDECTOMY Right 08/01/2015   Procedure: RIGHT PAROTIDECTOMY;  Surgeon: [name], MD;  Location: [facility];  Service: ENT;  Laterality: Right;  . POLYPECTOMY    . TONSILLECTOMY    . VAGINAL HYSTERECTOMY  1989    There were no vitals filed for this visit.    Subjective Assessment - 12/22/20 0933    Subjective Pt states that she has had intermittent Lt LE pain for the past 6 weeks. She will notice it during the day but mostly in the night and it impacts her sleep. Just this last week she has been having Rt anterior thigh burning. Occasionally she will have a slight limp without pain. She deals with alot of stress in her life and sometimes finds exercising difficult.    Patient Stated Goals decrease pain and improve sleep    Currently in Pain? No/denies    Pain Location --  Lt: anterior/lateral thigh and lateral shin; Rt: anterior thigh burning             [facility] PT Assessment - 12/22/20 0001      Assessment   Medical Diagnosis low back pain    Referring Provider (PT) Basil Dess, MD    Onset Date/Surgical Date --   6 weeks ago   Next MD Visit 2 weeks    Prior Therapy none      Precautions   Precautions None      Restrictions   Weight Bearing Restrictions No      Balance Screen   Has the patient fallen in the past 6 months No    Has the patient had a decrease in activity level because of a fear of falling?  No    Is the patient reluctant to leave their home because of a fear of falling?  No      Home Ecologist residence    Living Arrangements Alone      Prior Function   Level of Independence  Independent      Observation/Other Assessments   Focus on Therapeutic Outcomes (FOTO)  next visit      Sensation   Additional Comments pt denies numbnes/tingling but noted burning sensation Rt anterior thigh      Posture/Postural Control   Posture Comments pt with forward trunk lean 10 deg      ROM / Strength   AROM / PROM / Strength Strength;AROM      AROM   Overall AROM Comments active lumbar Rt rotation painful end range on Rt low back, all others limited 50% but pain free      Strength   Strength Assessment Site Hip;Knee;Ankle    Right/Left Hip --   Rt hip abduction 3+/5, able to complete active SLR; Lt hip abduction 2+/5, unable to complete active SLR through full ROM   Right/Left Ankle --   Pt unable to complete full single leg heel raise, Rt greater than Lt     Palpation   Palpation comment tenderness and muscle spasm of Lt gluteals, Lt vastus lateralis/rectus femoris      Transfers   Five time sit to stand comments  17 sec UE on thighs                      Objective measurements completed on examination: See above findings.       [facility] Adult PT Treatment/Exercise - 12/22/20 0001      Self-Care   Self-Care Posture    Posture sleeping positions to offload the greater trochanter on the Lt      Exercises   Exercises Knee/Hip      Knee/Hip Exercises: Standing   Heel Raises 10 reps;Both;1 set    Other Standing Knee Exercises hip flexion x5 reps for HEP demo, knee extended                  PT Education - 12/22/20 1553    Education Details sleep position, implemented HEP    Person(s) Educated Patient    Methods Explanation;Handout;Verbal cues;Demonstration    Comprehension Verbalized understanding;Returned demonstration            PT Short Term Goals - 12/22/20 1616      PT SHORT TERM GOAL #1   Title Pt will be independent with her initial HEP to improve ROM and strength of the LE.    Time 3  Period Weeks    Status New      PT  SHORT TERM GOAL #2   Title Pt will report atleast 25% improvement in her sleep from the start of PT.    Time 3    Period Weeks    Status New             PT Long Term Goals - 12/22/20 1618      PT LONG TERM GOAL #1   Title Pt will report atleast 50% improvement in her Lt LE pain from the start of PT.    Time 6    Period Weeks    Status New      PT LONG TERM GOAL #2   Title Pt will report getting atleast 4 hours of sleep at a time to improve her quality of life.    Time 6    Period Weeks    Status New      PT LONG TERM GOAL #3   Title Pt will be able to complete atleast 10 reps of active straight leg raise which will improve with her gait stability.    Time 6    Period Weeks    Status New      PT LONG TERM GOAL #4   Title Pt will be able to complete 5x sit to stand in 14 sec without the need for UE support on thighs.    Time 6    Period Weeks    Status New                  Plan - 12/22/20 1556    Clinical Impression Statement Pt is a pleasant 80 y.o with ongoing low back pain and Lt hip/LE pain intermittently. Pt has noted Rt anterior thigh burning this past week as well. Pt's sleep is negatively impacted by her Lt hip and LE pain. She has significant weakness of the Lt LE, unable to complete straight leg raise as well as hip abduction without assistance which contributes to her reported LE instability with walking. There is palpable tenderness and muscle spasm in the Lt gluteals and quariceps as well as pain with direct contact, which is consistent with trochanteric bursitis or glute tendonopathy. Pt has grossly limited active lumbar ROM, but this was painfree except for Rt trunk rotation. Pt was educated on sleep posture and adjustments this visit and was encouraged to complete HEP despite her history of inactivity. Pt would benefit from skilled PT to address her limitations in ROM, strength and posture with daily activity.    Personal Factors and Comorbidities  Age;Time since onset of injury/illness/exacerbation;Fitness;Past/Current Experience    Examination-Activity Limitations Stand;Locomotion Level;Sleep    Stability/Clinical Decision Making Evolving/Moderate complexity    Clinical Decision Making Moderate    Rehab Potential Good    PT Frequency 2x / week    PT Duration 6 weeks    PT Treatment/Interventions ADLs/Self Care Home Management;Cryotherapy;Aquatic Therapy;Electrical Stimulation;Moist Heat;Therapeutic activities;Patient/family education;Taping;Passive range of motion;Dry needling;Manual techniques;Therapeutic exercise;Balance training;Neuromuscular re-education    PT Next Visit Plan f/u on sleep adjustments, progress Lt hip strength, gentle lumbar flexibility, possible d/n to glutes    PT Home Exercise Plan [email or web address]    Consulted and Agree with Plan of Care Patient           Patient will benefit from skilled therapeutic intervention in order to improve the following deficits and impairments:  Abnormal gait,Improper body mechanics,Pain,Increased muscle spasms,Postural dysfunction,Decreased mobility,Decreased activity tolerance,Decreased balance,Decreased endurance,Decreased range of motion,Decreased  strength,Hypomobility,Impaired flexibility,Difficulty walking  Visit Diagnosis: Low back pain, unspecified back pain laterality, unspecified chronicity, unspecified whether sciatica present  Muscle weakness (generalized)  Other abnormalities of gait and mobility  Other muscle spasm     Problem List Patient Active Problem List   Diagnosis Date Noted  . Cystic-bullous disease of lung 11/07/2020  . Obstructive lung disease (generalized) ([place]) 11/07/2020  . Abnormal lung scan 07/12/2020  . Left-sided thoracic back pain 07/12/2020  . Multiple complaints 02/09/2020  . Coronary artery calcification 11/14/2019  . Pure hypercholesterolemia 11/14/2019  . Skin rash 02/14/2016  . Left flank pain 02/14/2016  . Hypersensitivity reaction  12/02/2015  . Essential hypertension 09/15/2015  . Pulmonary nodule 09/13/2015  . History of colon cancer, stage I 08/31/2015  . 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 H/O superficial parotidectomy 08/01/2015  . Other abnormal glucose 01/23/2013  . Left-sided headache 01/15/2013  . Chronic diarrhea 05/15/2012  . Bruising 01/25/2012  . Palpitations 11/12/2011  . Atypical chest pain 11/12/2011  . Preventative health care 05/15/2011  . Angina pectoris ([place]) 02/02/2010  . GERD 12/26/2009  . CONTACT DERMATITIS 09/02/2007  . DE QUERVAIN'S TENOSYNOVITIS 09/02/2007  . MENINGIOMA 07/22/2007  . DIVERTICULOSIS, COLON 07/22/2007  . FIBROMYALGIA 07/22/2007  . HYPERLIPIDEMIA 07/21/2007  . [facility] SYNDROME 07/21/2007    4:24 PM,12/22/20 Sherol Dade PT, DPT [facility] [address], [place], [place], [address] Phone: [phone]   Fax:  [phone]  Name: MARIAISABELLA MEENAN MRN: [id number] Date of Birth: Jul 12, 1941

## 2020-12-28 ENCOUNTER — Ambulatory Visit: Payer: PPO | Admitting: Physical Therapy

## 2020-12-28 ENCOUNTER — Encounter: Payer: Self-pay | Admitting: Physical Therapy

## 2020-12-28 ENCOUNTER — Other Ambulatory Visit: Payer: Self-pay

## 2020-12-28 DIAGNOSIS — M545 Low back pain, unspecified: Secondary | ICD-10-CM

## 2020-12-28 DIAGNOSIS — M6281 Muscle weakness (generalized): Secondary | ICD-10-CM

## 2020-12-28 DIAGNOSIS — M62838 Other muscle spasm: Secondary | ICD-10-CM

## 2020-12-28 DIAGNOSIS — R2689 Other abnormalities of gait and mobility: Secondary | ICD-10-CM

## 2020-12-28 NOTE — Patient Instructions (Signed)
Lifting Principles  .Maintain proper posture and head alignment. .Slide object as close as possible before lifting. .Move obstacles out of the way. .Test before lifting; ask for help if too heavy. .Tighten stomach muscles without holding breath. .Use smooth movements; do not jerk. .Use legs to do the work, and pivot with feet. .Distribute the work load symmetrically and close to the center of trunk. .Push instead of pull whenever possible.   Squat down and hold basket close to stand. Use leg muscles to do the work.    Avoid twisting or bending back. Pivot around using foot movements, and bend at knees if needed when reaching for articles.        Getting Into / Out of Bed   Lower self to lie down on one side by raising legs and lowering head at the same time. Use arms to assist moving without twisting. Bend both knees to roll onto back if desired. To sit up, start from lying on side, and use same move-ments in reverse. Keep trunk aligned with legs.    Shift weight from front foot to back foot as item is lifted off shelf.    When leaning forward to pick object up from floor, extend one leg out behind. Keep back straight. Hold onto a sturdy support with other hand.      Sit upright, head facing forward. Try using a roll to support lower back. Keep shoulders relaxed, and avoid rounded back. Keep hips level with knees. Avoid crossing legs for long periods.     Sleeping on Back  Place pillow under knees. A pillow with cervical support and a roll around waist are also helpful. Copyright  VHI. All rights reserved.  Sleeping on Side Place pillow between knees. Use cervical support under neck and a roll around waist as needed. Copyright  VHI. All rights reserved.   Sleeping on Stomach   If this is the only desirable sleeping position, place pillow under lower legs, and under stomach or chest as needed.  Posture - Sitting   Sit upright, head facing forward.  Try using a roll to support lower back. Keep shoulders relaxed, and avoid rounded back. Keep hips level with knees. Avoid crossing legs for long periods. Stand to Sit / Sit to Stand   To sit: Bend knees to lower self onto front edge of chair, then scoot back on seat. To stand: Reverse sequence by placing one foot forward, and scoot to front of seat. Use rocking motion to stand up.   Work Height and Reach  Ideal work height is no more than 2 to 4 inches below elbow level when standing, and at elbow level when sitting. Reaching should be limited to arm's length, with elbows slightly bent.  Bending  Bend at hips and knees, not back. Keep feet shoulder-width apart.    Posture - Standing   Good posture is important. Avoid slouching and forward head thrust. Maintain curve in low back and align ears over shoul- ders, hips over ankles.  Alternating Positions   Alternate tasks and change positions frequently to reduce fatigue and muscle tension. Take rest breaks. Computer Work   Position work to Programmer, multimedia. Use proper work and seat height. Keep shoulders back and down, wrists straight, and elbows at right angles. Use chair that provides full back support. Add footrest and lumbar roll as needed.  Getting Into / Out of Car  Lower self onto seat, scoot back, then bring in one leg at a time.  Reverse sequence to get out.  Dressing  Lie on back to pull socks or slacks over feet, or sit and bend leg while keeping back straight.    Housework - Sink  Place one foot on ledge of cabinet under sink when standing at sink for prolonged periods.   Pushing / Pulling  Pushing is preferable to pulling. Keep back in proper alignment, and use leg muscles to do the work.  Deep Squat   Squat and lift with both arms held against upper trunk. Tighten stomach muscles without holding breath. Use smooth movements to avoid jerking.  Avoid Twisting   Avoid twisting or bending back. Pivot around  using foot movements, and bend at knees if needed when reaching for articles.  Carrying Luggage   Distribute weight evenly on both sides. Use a cart whenever possible. Do not twist trunk. Move body as a unit.   Lifting Principles .Maintain proper posture and head alignment. .Slide object as close as possible before lifting. .Move obstacles out of the way. .Test before lifting; ask for help if too heavy. .Tighten stomach muscles without holding breath. .Use smooth movements; do not jerk. .Use legs to do the work, and pivot with feet. .Distribute the work load symmetrically and close to the center of trunk. .Push instead of pull whenever possible.   Ask For Help   Ask for help and delegate to others when possible. Coordinate your movements when lifting together, and maintain the low back curve.  Log Roll   Lying on back, bend left knee and place left arm across chest. Roll all in one movement to the right. Reverse to roll to the left. Always move as one unit. Housework - Sweeping  Use long-handled equipment to avoid stooping.   Housework - Wiping  Position yourself as close as possible to reach work surface. Avoid straining your back.  Laundry - Unloading Wash   To unload small items at bottom of washer, lift leg opposite to arm being used to reach.  [place] close to area to be raked. Use arm movements to do the work. Keep back straight and avoid twisting.     Cart  When reaching into cart with one arm, lift opposite leg to keep back straight.   Getting Into / Out of Bed  Lower self to lie down on one side by raising legs and lowering head at the same time. Use arms to assist moving without twisting. Bend both knees to roll onto back if desired. To sit up, start from lying on side, and use same move-ments in reverse. Housework - Vacuuming  Hold the vacuum with arm held at side. Step back and forth to move it, keeping head up. Avoid  twisting.   Laundry - IT consultant so that bending and twisting can be avoided.   Laundry - Unloading Dryer  Squat down to reach into clothes dryer or use a reacher.  Gardening - Weeding / Probation officer or Kneel. Knee pads may be helpful.

## 2020-12-28 NOTE — Therapy (Addendum)
[facility] [address] [place], [place], [address] Phone: [phone]   Fax:  [phone]  Physical Therapy Treatment/Discharge  Patient Details  Name: [name] MRN: [id number] Date of Birth: [birth year] Referring Provider (PT): Basil Dess, MD   Encounter Date: 12/28/2020   PT End of Session - 12/28/20 0931    Visit Number 2    Date for PT Re-Evaluation 02/03/21    Authorization Type [employer]    Authorization Time Period 12/22/20 to 02/02/21    PT Start Time 0931    PT Stop Time 1017    PT Time Calculation (min) 46 min    Activity Tolerance Patient tolerated treatment well;No increased pain    Behavior During Therapy WFL for tasks assessed/performed           Past Medical History:  Diagnosis Date  . Anxiety   . Arthritis    right thumb CMC arthritis  . Asthmatic bronchitis 2017  . Cancer ([place])    lymphoma  . Cataract    bilateral small- removed both eyes   . Colon cancer ([place])    stage 1- at appendiceal orifice   . Complication of anesthesia    has dry mouth anyway from shograns-sore throats post op  . Coronary artery calcification seen on CAT scan 11/14/2019  . Depression    since 1982   . Diverticulosis   . Fibromyalgia   . GERD (gastroesophageal reflux disease)    past hx   . Heart murmur   . Hyperlipidemia    controlled - metabolic syndrome per pt- goes up and down   . Hypertension   . Lung infiltrate on CT 09/14/2015  . Meningioma ([place]) 11/2009   right post parietal 5m - stable 11/2009 MRI  . Nephrolithiasis   . Nephrolithiasis   . Neuromuscular disorder (HCC)    fibromyalgia  . 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 Pure hypercholesterolemia 11/14/2019  . Sjogren's disease ([facility]     Past Surgical History:  Procedure Laterality Date  . ABDOMINAL HYSTERECTOMY    . APPENDECTOMY  2008  . ARTERY BIOPSY  02/03/2013   Procedure: MINOR BIOPSY  TEMPORAL ARTERY;  Surgeon: SAscencion Dike MD;  [facility]  Service: ENT;;  . COLONOSCOPY    . [place] . LEFT HEART CATH AND CORONARY ANGIOGRAPHY N/A 12/01/2019   Procedure: LEFT HEART CATH AND CORONARY ANGIOGRAPHY;  Surgeon: KTroy Sine MD;  [facility];  Service: Cardiovascular;  Laterality: N/A;  . MASS EXCISION  2008   abd-benign  . PAROTIDECTOMY Right 08/01/2015   Procedure: RIGHT PAROTIDECTOMY;  Surgeon: SLeta Baptist MD;  [facility]  Service: ENT;  Laterality: Right;  . POLYPECTOMY    . TONSILLECTOMY    . VAGINAL HYSTERECTOMY  1989    There were no vitals filed for this visit.   Subjective Assessment - 12/28/20 0932    Subjective Pt states she is having symptoms in both legs now. Pt arrives with reports of wanting to understand what is happening to her, pt demonstrates high concern regarding her situation. " The doctor did not use words i could understand. He wants me to take Tylenol Arthritis.    Patient Stated Goals decrease pain and improve sleep    Currently in Pain? Yes   Pt describes Rt anterior thigh burning and tingling this AM: Lt is ok right  now.                            [facility] Adult PT Treatment/Exercise - 12/28/20 0001      Self-Care   Self-Care ADL's;Lifting;Posture;Heat/Ice Application;Other Self-Care Comments    ADL's Body mechanics that reduce strain to lumbar    Lifting See above    Posture Review of decompressive positions specifically hooklying. Pt will try a "rest period" in the after noon and prior to going to bed to lay in hooklying for at least 10 min.    Heat/Ice Application benefits of heating pad    Other Self-Care Comments  Pt had a lot of questions reagrding what is happening to her back and how this relates to her leg symptoms. Also discussed how Sojourns ( she has this ) can effect joints. Pt was educated in ways to reduce strain to her low back.  Pt was also instructed by her MD to take Tylenol arthritis. She is not taking regularly. PTA advised pt to take as advised possibly in AM and PM. pt agreed.                  PT Education - 12/28/20 1052    Education Details How to reduce strain and inflammation to lumbar spine    Person(s) Educated Patient    Methods Explanation;Handout    Comprehension Verbalized understanding            PT Short Term Goals - 12/22/20 1616      PT SHORT TERM GOAL #1   Title Pt will be independent with her initial HEP to improve ROM and strength of the LE.    Time 3    Period Weeks    Status New      PT SHORT TERM GOAL #2   Title Pt will report atleast 25% improvement in her sleep from the start of PT.    Time 3    Period Weeks    Status New             PT Long Term Goals - 12/22/20 1618      PT LONG TERM GOAL #1   Title Pt will report atleast 50% improvement in her Lt LE pain from the start of PT.    Time 6    Period Weeks    Status New      PT LONG TERM GOAL #2   Title Pt will report getting atleast 4 hours of sleep at a time to improve her quality of life.    Time 6    Period Weeks    Status New      PT LONG TERM GOAL #3   Title Pt will be able to complete atleast 10 reps of active straight leg raise which will improve with her gait stability.    Time 6    Period Weeks    Status New      PT LONG TERM GOAL #4   Title Pt will be able to complete 5x sit to stand in 14 sec without the need for UE support on thighs.    Time 6    Period Weeks    Status New                 Plan - 12/28/20 1037    Clinical Impression Statement Pt arrives today very concerned that both legs are 'bothering her now." She presents today with RT anterior  thigh burning and tingling. Initial complaint is still the LT leg. She has tried the pillow between her knees at night but reports not having much change with her night time pains. PTA spent the duration of todays session educating  pt in how DDD can create leg symptoms and a variety of ways to help reduce compression forces and inflammation including: body mechanics, taking her tylenol as MD prescribed, decompressive positions, and the use of heat. Pt verbally understood all principles.    Personal Factors and Comorbidities Age;Time since onset of injury/illness/exacerbation;Fitness;Past/Current Experience    Examination-Activity Limitations Stand;Locomotion Level;Sleep    Stability/Clinical Decision Making Evolving/Moderate complexity    Rehab Potential Good    PT Frequency 2x / week    PT Duration 6 weeks    PT Treatment/Interventions ADLs/Self Care Home Management;Cryotherapy;Aquatic Therapy;Electrical Stimulation;Moist Heat;Therapeutic activities;Patient/family education;Taping;Passive range of motion;Dry needling;Manual techniques;Therapeutic exercise;Balance training;Neuromuscular re-education    PT Next Visit Plan See if pt started taking her Tylenol, did laying in more decompression positions help manage her symptoms especially at night? Work towards more HEP on lumbar flexibility and gentle hip strength.    PT Home Exercise Plan [id number]    Consulted and Agree with Plan of Care Patient           Patient will benefit from skilled therapeutic intervention in order to improve the following deficits and impairments:  Abnormal gait,Improper body mechanics,Pain,Increased muscle spasms,Postural dysfunction,Decreased mobility,Decreased activity tolerance,Decreased balance,Decreased endurance,Decreased range of motion,Decreased strength,Hypomobility,Impaired flexibility,Difficulty walking  Visit Diagnosis: Low back pain, unspecified back pain laterality, unspecified chronicity, unspecified whether sciatica present  Muscle weakness (generalized)  Other abnormalities of gait and mobility  Other muscle spasm     Problem List Patient Active Problem List   Diagnosis Date Noted  . Cystic-bullous disease of lung  11/07/2020  . Obstructive lung disease (generalized) ([place]) 11/07/2020  . Abnormal lung scan 07/12/2020  . Left-sided thoracic back pain 07/12/2020  . Multiple complaints 02/09/2020  . Coronary artery calcification 11/14/2019  . Pure hypercholesterolemia 11/14/2019  . Skin rash 02/14/2016  . Left flank pain 02/14/2016  . Hypersensitivity reaction 12/02/2015  . Essential hypertension 09/15/2015  . Pulmonary nodule 09/13/2015  . History of colon cancer, stage I 08/31/2015  . 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 H/O superficial parotidectomy 08/01/2015  . Other abnormal glucose 01/23/2013  . Left-sided headache 01/15/2013  . Chronic diarrhea 05/15/2012  . Bruising 01/25/2012  . Palpitations 11/12/2011  . Atypical chest pain 11/12/2011  . Preventative health care 05/15/2011  . Angina pectoris ([place]) 02/02/2010  . GERD 12/26/2009  . CONTACT DERMATITIS 09/02/2007  . DE QUERVAIN'S TENOSYNOVITIS 09/02/2007  . MENINGIOMA 07/22/2007  . DIVERTICULOSIS, COLON 07/22/2007  . FIBROMYALGIA 07/22/2007  . HYPERLIPIDEMIA 07/21/2007  . SJOGREN'S SYNDROME 07/21/2007    Dvontae Ruan, PTA 12/28/2020, 10:53 AM  [facility] [address], [place], [place], [address] Phone: [phone]   Fax:  [phone]  Name: [name] MRN: [id number] Date of Birth: November 09, 1940  *addendum to resolve episode of care and d/c pt from PT.  [place]  Visits from Start of Care: 2  Current functional level related to goals / functional outcomes: See above for more details    Remaining deficits: See above for more details    Education / Equipment: See above for more details   Plan: Patient agrees to discharge.  Patient goals were not met. Patient is being discharged due to  being pleased with the current functional level.  ?????      1:56 PM,01/19/21 Sherol Dade  PT, [facility]

## 2021-01-03 ENCOUNTER — Encounter: Payer: PPO | Admitting: Physical Therapy

## 2021-01-06 ENCOUNTER — Encounter: Payer: Self-pay | Admitting: Specialist

## 2021-01-06 ENCOUNTER — Ambulatory Visit: Payer: PPO | Admitting: Specialist

## 2021-01-06 ENCOUNTER — Other Ambulatory Visit: Payer: Self-pay

## 2021-01-06 VITALS — BP 132/80 | HR 79 | Ht 69.0 in | Wt 209.0 lb

## 2021-01-06 DIAGNOSIS — M7061 Trochanteric bursitis, right hip: Secondary | ICD-10-CM | POA: Diagnosis not present

## 2021-01-06 DIAGNOSIS — M5136 Other intervertebral disc degeneration, lumbar region: Secondary | ICD-10-CM

## 2021-01-06 DIAGNOSIS — M545 Low back pain, unspecified: Secondary | ICD-10-CM | POA: Diagnosis not present

## 2021-01-06 NOTE — Progress Notes (Addendum)
Office Visit Note   Patient: [name]           Date of Birth: 1940-11-19           MRN: [id number] Visit Date: 01/06/2021              Requested by: Maurice Small, MD [place],  [place] [address] PCP: Maurice Small, MD   Assessment & Plan: Visit Diagnoses:  1. Degenerative disc disease, lumbar   2. Low back pain, unspecified back pain laterality, unspecified chronicity, unspecified whether sciatica present   3. Greater trochanteric bursitis of right hip     Plan: Avoid bending, stooping and avoid lifting weights greater than 10 lbs. Avoid prolong standing and walking. Avoid frequent bending and stooping  No lifting greater than 10 lbs. May use ice or moist heat for pain. Weight loss is of benefit.     Follow-Up Instructions: No follow-ups on file.   Orders:  No orders of the defined types were placed in this encounter.  No orders of the defined types were placed in this encounter.     Procedures: No procedures performed   Clinical Data: No additional findings.   Subjective: Chief Complaint  Patient presents with  . Lower Back - Follow-up  . Right Hip - Follow-up    80 year old female with history of low back pain  And right greater trochanter pain due to bursitis and overuse. She is better and stopped PT about 2 weeks ago. She started taking a break in the middle of the day and uses a wedge  Under the knees and lies down and this helps. She turned 80 year old and is better and there is no weakness. There is pain into the right anterolateral thigh. She feels that with walking the right leg is not totally  Normal but she is able to live with this discomfort.   Review of Systems  Constitutional: Negative.   HENT: Negative.   Eyes: Negative.   Respiratory: Negative.   Cardiovascular: Negative.   Gastrointestinal: Negative.   Endocrine: Negative.   Genitourinary: Negative.   Musculoskeletal: Negative.   Skin: Negative.    Allergic/Immunologic: Negative.   Neurological: Negative.   Hematological: Negative.   Psychiatric/Behavioral: Negative.      Objective: Vital Signs: BP 132/80 (BP Location: Left Arm, Patient Position: Sitting)   Pulse 79   Ht 5\' 9"  (1.753 m)   Wt 209 lb (94.8 kg)   BMI 30.86 kg/m   Physical Exam Constitutional:      Appearance: She is well-developed.  HENT:     Head: Normocephalic and atraumatic.  Eyes:     Pupils: Pupils are equal, round, and reactive to light.  Pulmonary:     Effort: Pulmonary effort is normal.     Breath sounds: Normal breath sounds.  Abdominal:     General: Bowel sounds are normal.     Palpations: Abdomen is soft.  Musculoskeletal:        General: Normal range of motion.     Cervical back: Normal range of motion and neck supple.     Lumbar back: Negative right straight leg raise test and negative left straight leg raise test.  Skin:    General: Skin is warm and dry.  Neurological:     Mental Status: She is alert and oriented to person, place, and time.  Psychiatric:        Behavior: Behavior normal.  Thought Content: Thought content normal.        Judgment: Judgment normal.     Back Exam   Tenderness  The patient is experiencing tenderness in the lumbar.  Range of Motion  Extension: normal  Flexion: normal  Lateral bend right: normal  Rotation right: normal  Rotation left: normal   Muscle Strength  Right Quadriceps:  5/5  Left Quadriceps:  5/5  Right Hamstrings:  5/5   Tests  Straight leg raise right: negative Straight leg raise left: negative  Other  Toe walk: normal Heel walk: normal      Specialty Comments:  No specialty comments available.  Imaging: No results found.   PMFS History: Patient Active Problem List   Diagnosis Date Noted  . Cystic-bullous disease of lung 11/07/2020  . Obstructive lung disease (generalized) ([place]) 11/07/2020  . Abnormal lung scan 07/12/2020  . Left-sided thoracic back pain  07/12/2020  . Multiple complaints 02/09/2020  . Coronary artery calcification 11/14/2019  . Pure hypercholesterolemia 11/14/2019  . Skin rash 02/14/2016  . Left flank pain 02/14/2016  . Hypersensitivity reaction 12/02/2015  . Essential hypertension 09/15/2015  . Pulmonary nodule 09/13/2015  . History of colon cancer, stage I 08/31/2015  . 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 H/O superficial parotidectomy 08/01/2015  . Other abnormal glucose 01/23/2013  . Left-sided headache 01/15/2013  . Chronic diarrhea 05/15/2012  . Bruising 01/25/2012  . Palpitations 11/12/2011  . Atypical chest pain 11/12/2011  . Preventative health care 05/15/2011  . Angina pectoris ([place]) 02/02/2010  . GERD 12/26/2009  . CONTACT DERMATITIS 09/02/2007  . DE QUERVAIN'S TENOSYNOVITIS 09/02/2007  . MENINGIOMA 07/22/2007  . DIVERTICULOSIS, COLON 07/22/2007  . FIBROMYALGIA 07/22/2007  . HYPERLIPIDEMIA 07/21/2007  . [place] SYNDROME 07/21/2007   Past Medical History:  Diagnosis Date  . Anxiety   . Arthritis    right thumb CMC arthritis  . Asthmatic bronchitis 2017  . Cancer ([place])    lymphoma  . Cataract    bilateral small- removed both eyes   . Colon cancer ([place])    stage 1- at appendiceal orifice   . Complication of anesthesia    has dry mouth anyway from shograns-sore throats post op  . Coronary artery calcification seen on CAT scan 11/14/2019  . Depression    since 1982   . Diverticulosis   . Fibromyalgia   . GERD (gastroesophageal reflux disease)    past hx   . Heart murmur   . Hyperlipidemia    controlled - metabolic syndrome per pt- goes up and down   . Hypertension   . Lung infiltrate on CT 09/14/2015  . Meningioma ([place]) 11/2009   right post parietal 85mm - stable 11/2009 MRI  . Nephrolithiasis   . Nephrolithiasis   . Neuromuscular disorder (HCC)    fibromyalgia  . 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 Pure  hypercholesterolemia 11/14/2019  . Sjogren's disease ([place])     Family History  Problem Relation Age of Onset  . Colon cancer Mother 104  . Colon cancer Father        unknown when onset was  . Diabetes Neg Hx   . Stomach cancer Neg Hx   . Colon polyps Neg Hx   . Esophageal cancer Neg Hx   . Rectal cancer Neg Hx     Past Surgical History:  Procedure Laterality Date  . ABDOMINAL HYSTERECTOMY    . APPENDECTOMY  2008  .  ARTERY BIOPSY  02/03/2013   Procedure: MINOR BIOPSY TEMPORAL ARTERY;  Surgeon: Ascencion Dike, MD;  Location: [facility];  Service: ENT;;  . COLONOSCOPY    . [place]  . LEFT HEART CATH AND CORONARY ANGIOGRAPHY N/A 12/01/2019   Procedure: LEFT HEART CATH AND CORONARY ANGIOGRAPHY;  Surgeon: [place] Sine, MD;  Location: [facility];  Service: Cardiovascular;  Laterality: N/A;  . MASS EXCISION  2008   abd-benign  . PAROTIDECTOMY Right 08/01/2015   Procedure: RIGHT PAROTIDECTOMY;  Surgeon: [name], MD;  Location: [facility];  Service: ENT;  Laterality: Right;  . POLYPECTOMY    . TONSILLECTOMY    . VAGINAL HYSTERECTOMY  1989   Social History   Occupational History  . Occupation: Retired    Fish farm manager: UNEMPLOYED  Tobacco Use  . Smoking status: Never Smoker  . Smokeless tobacco: Never Used  . Tobacco comment: Passive exposure through her mother.  Vaping Use  . Vaping Use: Never used  Substance and Sexual Activity  . Alcohol use: Yes    Alcohol/week: 0.0 standard drinks    Comment: Rarely  . Drug use: No  . Sexual activity: Not on file    Comment: [place] sister.

## 2021-01-06 NOTE — Patient Instructions (Signed)
Plan: Avoid bending, stooping and avoid lifting weights greater than 10 lbs. ?Avoid prolong standing and walking. ?Avoid frequent bending and stooping  ?No lifting greater than 10 lbs. ?May use ice or moist heat for pain. ?Weight loss is of benefit. ?

## 2021-01-09 ENCOUNTER — Encounter: Payer: PPO | Admitting: Physical Therapy

## 2021-01-13 ENCOUNTER — Encounter: Payer: PPO | Admitting: Physical Therapy

## 2021-02-08 ENCOUNTER — Other Ambulatory Visit (HOSPITAL_COMMUNITY): Payer: Self-pay | Admitting: Hematology and Oncology

## 2021-02-08 DIAGNOSIS — C8588 Other specified types of non-Hodgkin lymphoma, lymph nodes of multiple sites: Secondary | ICD-10-CM

## 2021-02-09 ENCOUNTER — Other Ambulatory Visit: Payer: Self-pay

## 2021-02-09 ENCOUNTER — Inpatient Hospital Stay: Payer: PPO | Attending: Hematology and Oncology | Admitting: Hematology and Oncology

## 2021-02-09 ENCOUNTER — Inpatient Hospital Stay: Payer: PPO

## 2021-02-09 ENCOUNTER — Encounter: Payer: Self-pay | Admitting: Hematology and Oncology

## 2021-02-09 VITALS — BP 153/69 | HR 85 | Temp 97.0°F | Resp 18 | Wt 208.4 lb

## 2021-02-09 DIAGNOSIS — Z85038 Personal history of other malignant neoplasm of large intestine: Secondary | ICD-10-CM | POA: Diagnosis not present

## 2021-02-09 DIAGNOSIS — Z79899 Other long term (current) drug therapy: Secondary | ICD-10-CM | POA: Insufficient documentation

## 2021-02-09 DIAGNOSIS — R942 Abnormal results of pulmonary function studies: Secondary | ICD-10-CM

## 2021-02-09 DIAGNOSIS — Z8572 Personal history of non-Hodgkin lymphomas: Secondary | ICD-10-CM | POA: Insufficient documentation

## 2021-02-09 DIAGNOSIS — C8588 Other specified types of non-Hodgkin lymphoma, lymph nodes of multiple sites: Secondary | ICD-10-CM

## 2021-02-09 DIAGNOSIS — M3500 Sicca syndrome, unspecified: Secondary | ICD-10-CM | POA: Diagnosis not present

## 2021-02-09 DIAGNOSIS — Z9221 Personal history of antineoplastic chemotherapy: Secondary | ICD-10-CM | POA: Diagnosis not present

## 2021-02-09 LAB — CBC WITH DIFFERENTIAL/PLATELET
Abs Immature Granulocytes: 0.01 10*3/uL (ref 0.00–0.07)
Basophils Absolute: 0 10*3/uL (ref 0.0–0.1)
Basophils Relative: 1 %
Eosinophils Absolute: 0.3 10*3/uL (ref 0.0–0.5)
Eosinophils Relative: 4 %
HCT: 37.3 % (ref 36.0–46.0)
Hemoglobin: 12.3 g/dL (ref 12.0–15.0)
Immature Granulocytes: 0 %
Lymphocytes Relative: 15 %
Lymphs Abs: 1.1 10*3/uL (ref 0.7–4.0)
MCH: 29.4 pg (ref 26.0–34.0)
MCHC: 33 g/dL (ref 30.0–36.0)
MCV: 89 fL (ref 80.0–100.0)
Monocytes Absolute: 0.8 10*3/uL (ref 0.1–1.0)
Monocytes Relative: 10 %
Neutro Abs: 5.1 10*3/uL (ref 1.7–7.7)
Neutrophils Relative %: 70 %
Platelets: 238 10*3/uL (ref 150–400)
RBC: 4.19 MIL/uL (ref 3.87–5.11)
RDW: 13 % (ref 11.5–15.5)
WBC: 7.3 10*3/uL (ref 4.0–10.5)
nRBC: 0 % (ref 0.0–0.2)

## 2021-02-09 NOTE — Assessment & Plan Note (Signed)
She denies new signs or symptoms to suggest colon cancer recurrence She does not need follow-up in this regard

## 2021-02-09 NOTE — Assessment & Plan Note (Signed)
She has palpable lymphadenopathy on the left submandibular/parotid region, worrisome for recurrent lymphoma I plan to order a PET CT scan for staging She will likely need repeat biopsy and further treatment depending on the outcome of the results

## 2021-02-09 NOTE — Progress Notes (Signed)
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[facility]) She has palpable lymphadenopathy on the left submandibular/parotid region, worrisome for recurrent lymphoma I plan to order a PET CT scan for staging She will likely need repeat biopsy and further treatment depending on the outcome of the results  History of colon cancer, stage I She denies new signs or symptoms to suggest colon cancer recurrence She does not need follow-up in this regard  Abnormal lung scan She had abnormal lung findings on prior imaging studies We will take a look at her lung images on PET CT scan Currently, she is not symptomatic Her oxygen saturation is adequate on room air  Orders Placed This Encounter  Procedures   NM PET Image Restage (PS) Skull Base to Thigh    Standing Status:   Future    Standing Expiration Date:   02/09/2022    Order Specific Question:   If indicated for the ordered procedure, I authorize the administration of a radiopharmaceutical per Radiology protocol    Answer:   Yes    Order Specific Question:   Preferred imaging location?    Answer:   [facility]    Order Specific Question:   Radiology Contrast Protocol - do NOT remove file path    Answer:   [email or web address]    All questions were answered. The patient knows to call the clinic with any problems, questions or concerns. The total time spent in the appointment was 30 minutes encounter with patients including review of chart and various tests  results, discussions about plan of care and coordination of care plan   Heath Lark, MD 02/09/2021 10:06 AM  INTERVAL HISTORY: Please see below for problem oriented charting. 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 Please refer to report for other findings    11/10/2015 Procedure   she underwent CT guided biopsy    11/10/2015 Pathology Results   Accession: [id number] right lung CT-guided biopsy suspicious for lymphoma    12/02/2015 - 12/23/2015 Chemotherapy   She received 4 doses of weekly rituximab.    02/10/2016 PET scan   There is no evidence for residual or recurrent hypermetabolic adenopathy    12/26/3265 Imaging   The masslike area of concern in the medial right lung base seen on the previous study is unchanged in size since March 2017. A biopsy was previously recommended after the CT scan from October 26, 2015. Please see that report for further recommendations as the  finding was better characterized at that time. 2. No pulmonary emboli. 3. Multiple thin walled cysts in the mid and lower lungs is stable. 4. 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    02/06/2017 PET scan   No evidence of active lymphomatous involvement. Stable 1.5 cm mixed cystic/ solid nodule in the right lower lobe, non FDG avid. Continued attention on follow-up is suggested.     02/13/2017 Procedure   Colonoscopy - One 8 mm polyp in the cecum, removed with a cold snare. Resected and retrieved. - Two 3 to 4 mm polyps in the transverse colon, removed with a cold biopsy forceps. Resected and retrieved. - One 5 mm polyp in the descending colon, removed with a cold snare. Resected and retrieved. - Two 3 to 5 mm polyps at the recto-sigmoid colon, removed with a cold snare. Resected and retrieved. - Mild diverticulosis in the sigmoid colon. - Internal hemorrhoids.       02/06/2018 PET scan   Negative PET-CT. No findings for enlarged or hypermetabolic lymph nodes to suggest lymphoma.      REVIEW OF SYSTEMS:   Constitutional: Denies fevers, chills or abnormal weight loss Eyes: Denies blurriness of vision Ears, nose, mouth, throat, and face: Denies mucositis or sore throat Respiratory: Denies cough, dyspnea or wheezes Cardiovascular: Denies palpitation, chest discomfort or lower extremity swelling Gastrointestinal:  Denies nausea, heartburn or change in bowel habits Skin: Denies abnormal skin rashes Lymphatics: Denies new lymphadenopathy or easy bruising Neurological:Denies numbness, tingling or new weaknesses Behavioral/Psych: Mood is stable, no new changes  All other systems were reviewed with the patient and are negative.  I have reviewed the past medical history, past surgical history, social history and family history with the patient and they are unchanged from previous note.  ALLERGIES:  has No Known Allergies.  MEDICATIONS:  Current Outpatient Medications  Medication Sig Dispense Refill   acetaminophen (TYLENOL) 500 MG tablet Take 500 mg by mouth every 6 (six) hours as needed for moderate pain.     Calcium Carb-Cholecalciferol (CALCIUM 600-D PO) Take 1 tablet by mouth daily.     Calcium Carbonate-Vitamin D 600-200 MG-UNIT TABS Take 1 tablet by mouth daily.     Cholecalciferol (VITAMIN D3) 50 MCG (2000 UT) TABS Take 2 tablets by mouth daily.     COVID-19  mRNA vaccine, Pfizer, 30 MCG/0.3ML injection INJECT AS DIRECTED .3 mL 0   Magnesium 250 MG TABS Take 2 tablets by mouth daily.     methylPREDNISolone (MEDROL DOSEPAK) 4 MG TBPK tablet Take as directed 6 day dose pak. 21 tablet 0   Potassium 99 MG TABS Take 2 tablets by mouth daily.     simvastatin (ZOCOR) 20 MG tablet Take 20 mg by mouth at bedtime.      vitamin B-12 (CYANOCOBALAMIN) 1000 MCG tablet Take 1,000 mcg by mouth as needed.      No current facility-administered medications for this visit.    PHYSICAL EXAMINATION: ECOG PERFORMANCE STATUS: 1 - Symptomatic but completely ambulatory  Vitals:   02/09/21 0915  BP: (!) 153/69  Pulse: 85  Resp: 18  Temp: (!) 97 F (36.1 C)  SpO2: 94%   Filed Weights   02/09/21 0915  Weight: 208 lb 6.4 oz (94.5 kg)    GENERAL:alert, no distress and comfortable SKIN: skin color, texture, turgor are normal, no rashes or significant lesions EYES: normal, Conjunctiva are pink and non-injected, sclera clear OROPHARYNX:no exudate, no erythema and lips, buccal mucosa, and tongue normal  NECK: supple,  thyroid normal size, non-tender, without nodularity LYMPH: She has palpable lymphadenopathy which is new on the left side of her submandibular region near angle of the jaw  LUNGS: clear to auscultation and percussion with normal breathing effort HEART: regular rate & rhythm and no murmurs and no lower extremity edema ABDOMEN:abdomen soft, non-tender and normal bowel sounds Musculoskeletal:no cyanosis of digits and no clubbing  NEURO: alert & oriented x 3 with fluent speech, no focal motor/sensory deficits  LABORATORY DATA:  I have reviewed the data as listed    Component Value Date/Time   NA 140 05/28/2020 0904   NA 143 11/25/2019 0842   NA 143 02/06/2017 1302   K 4.1 05/28/2020 0904   K 4.8 02/06/2017 1302   CL 106 05/28/2020 0904   CO2 22 05/28/2020 0904   CO2 26 02/06/2017 1302   GLUCOSE 121 (H) 05/28/2020 0904   GLUCOSE 100 02/06/2017  1302   BUN 11 05/28/2020 0904   BUN 14 11/25/2019 0842   BUN 14.2 02/06/2017 1302   CREATININE 0.67 05/28/2020 0904   CREATININE 0.8 02/06/2017 1302   CALCIUM 9.1 05/28/2020 0904   CALCIUM 9.7 02/06/2017 1302   PROT 7.1 05/28/2020 0904   PROT 7.2 01/28/2020 0911   PROT 7.8 02/06/2017 1302   ALBUMIN 3.9 05/28/2020 0904   ALBUMIN 4.5 01/28/2020 0911   ALBUMIN 3.9 02/06/2017 1302   AST 23 05/28/2020 0904   AST 20 02/06/2017 1302   ALT 22 05/28/2020 0904   ALT 25 02/06/2017 1302   ALKPHOS 83 05/28/2020 0904   ALKPHOS 105 02/06/2017 1302   BILITOT 0.6 05/28/2020 0904   BILITOT 0.3 01/28/2020 0911   BILITOT 0.41 02/06/2017 1302   GFRNONAA >60 05/28/2020 0904   GFRAA >60 02/09/2020 0818    No results found for: SPEP, UPEP  Lab Results  Component Value Date   WBC 7.3 02/09/2021   NEUTROABS 5.1 02/09/2021   HGB 12.3 02/09/2021   HCT 37.3 02/09/2021   MCV 89.0 02/09/2021   PLT 238 02/09/2021      Chemistry      Component Value Date/Time   NA 140 05/28/2020 0904   NA 143 11/25/2019 0842   NA 143 02/06/2017 1302   K 4.1 05/28/2020 0904   K 4.8 02/06/2017 1302   CL 106 05/28/2020 0904   CO2 22 05/28/2020 0904   CO2 26 02/06/2017 1302   BUN 11 05/28/2020 0904   BUN 14 11/25/2019 0842   BUN 14.2 02/06/2017 1302   CREATININE 0.67 05/28/2020 0904   CREATININE 0.8 02/06/2017 1302      Component Value Date/Time   CALCIUM 9.1 05/28/2020 0904   CALCIUM 9.7 02/06/2017 1302   ALKPHOS 83 05/28/2020 0904   ALKPHOS 105 02/06/2017 1302   AST 23 05/28/2020 0904   AST 20 02/06/2017 1302   ALT 22 05/28/2020 0904   ALT 25 02/06/2017 1302   BILITOT 0.6 05/28/2020 0904   BILITOT 0.3 01/28/2020 0911   BILITOT 0.41 02/06/2017 1302

## 2021-02-09 NOTE — Assessment & Plan Note (Signed)
She had abnormal lung findings on prior imaging studies We will take a look at her lung images on PET CT scan Currently, she is not symptomatic Her oxygen saturation is adequate on room air

## 2021-02-10 ENCOUNTER — Telehealth: Payer: Self-pay

## 2021-02-10 NOTE — Telephone Encounter (Signed)
Called and given below message. Appt scheduled. She is aware of appt time/date.

## 2021-02-10 NOTE — Telephone Encounter (Signed)
-----   Message from Heath Lark, MD sent at 02/10/2021 12:11 PM EDT ----- Pls schedule follow-up on 7/14 30 mins to review PET CT results

## 2021-02-16 ENCOUNTER — Other Ambulatory Visit: Payer: Self-pay | Admitting: Hematology and Oncology

## 2021-02-16 ENCOUNTER — Telehealth: Payer: Self-pay

## 2021-02-16 DIAGNOSIS — K118 Other diseases of salivary glands: Secondary | ICD-10-CM | POA: Insufficient documentation

## 2021-02-16 DIAGNOSIS — C8588 Other specified types of non-Hodgkin lymphoma, lymph nodes of multiple sites: Secondary | ICD-10-CM

## 2021-02-16 NOTE — Telephone Encounter (Signed)
Called Dr. Benjamine Mola office. [name] has appt 7/25 already w/ Dr. Benjamine Mola. Faxed last office note to [phone], received confirmation.

## 2021-02-16 NOTE — Telephone Encounter (Signed)
Called and given below message. She verbalized understanding. She will call radiology scheduling to see if they have any cancellations. She does not want to travel to [facility] or [facility] for PET appt. She has already scheduled appt with Dr. Benjamine Mola herself on 7/25.

## 2021-02-16 NOTE — Telephone Encounter (Signed)
She called and left a message. She is concerned. The area to the left carotid has tripled in size and is as hard as a rock. She has started to have discomfort/ pain in the area. PET scan is scheduled 7/12. She is concerned that the pain will get worse.

## 2021-02-17 ENCOUNTER — Telehealth: Payer: Self-pay

## 2021-02-17 NOTE — Telephone Encounter (Signed)
Called and rescheduled appt with Dr. Alvy Bimler to 7/8 at 2 pm. She verbalized understanding.

## 2021-02-23 ENCOUNTER — Other Ambulatory Visit: Payer: Self-pay

## 2021-02-23 ENCOUNTER — Encounter (HOSPITAL_COMMUNITY)
Admission: RE | Admit: 2021-02-23 | Discharge: 2021-02-23 | Disposition: A | Discharge status: Home / Self Care | Payer: PPO | Source: Ambulatory Visit | Attending: Hematology and Oncology | Admitting: Hematology and Oncology

## 2021-02-23 DIAGNOSIS — Z79899 Other long term (current) drug therapy: Secondary | ICD-10-CM | POA: Diagnosis not present

## 2021-02-23 DIAGNOSIS — C8588 Other specified types of non-Hodgkin lymphoma, lymph nodes of multiple sites: Secondary | ICD-10-CM | POA: Insufficient documentation

## 2021-02-23 DIAGNOSIS — Z85038 Personal history of other malignant neoplasm of large intestine: Secondary | ICD-10-CM | POA: Insufficient documentation

## 2021-02-23 DIAGNOSIS — C8590 Non-Hodgkin lymphoma, unspecified, unspecified site: Secondary | ICD-10-CM | POA: Diagnosis not present

## 2021-02-23 DIAGNOSIS — C189 Malignant neoplasm of colon, unspecified: Secondary | ICD-10-CM | POA: Diagnosis not present

## 2021-02-23 LAB — GLUCOSE, CAPILLARY: Glucose-Capillary: 104 mg/dL — ABNORMAL HIGH (ref 70–99)

## 2021-02-23 MED ORDER — FLUDEOXYGLUCOSE F - 18 (FDG) INJECTION
11.0000 | Freq: Once | INTRAVENOUS | Status: AC | PRN
  Administered 2021-02-23: 10.3 via INTRAVENOUS

## 2021-02-24 ENCOUNTER — Encounter: Payer: Self-pay | Admitting: Hematology and Oncology

## 2021-02-24 ENCOUNTER — Inpatient Hospital Stay: Payer: PPO | Attending: Hematology and Oncology | Admitting: Hematology and Oncology

## 2021-02-24 DIAGNOSIS — I10 Essential (primary) hypertension: Secondary | ICD-10-CM

## 2021-02-24 DIAGNOSIS — M3500 Sicca syndrome, unspecified: Secondary | ICD-10-CM | POA: Diagnosis not present

## 2021-02-24 DIAGNOSIS — Z9221 Personal history of antineoplastic chemotherapy: Secondary | ICD-10-CM | POA: Insufficient documentation

## 2021-02-24 DIAGNOSIS — C8588 Other specified types of non-Hodgkin lymphoma, lymph nodes of multiple sites: Secondary | ICD-10-CM | POA: Diagnosis not present

## 2021-02-24 DIAGNOSIS — J984 Other disorders of lung: Secondary | ICD-10-CM | POA: Diagnosis not present

## 2021-02-24 NOTE — Assessment & Plan Note (Signed)
Her blood pressure is quite elevated today likely due to anxiety Observe closely for now

## 2021-02-24 NOTE — Progress Notes (Signed)
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 Excisional biopsy is preferable but total parotidectomy should not be required I will reach out to her ENT surgeon to discuss this Assuming that we are dealing with another recurrence of marginal zone lymphoma, she can be successfully treated with rituximab in the future I will see her within 3 to 5 days after biopsy to review test results and discussed plan of care  [facility] SYNDROME She is symptomatic from Sjogren's syndrome She does not have a rheumatologist Discussed importance of rheumatology follow-up  Essential hypertension Her blood pressure is quite elevated today likely due to anxiety Observe closely for now  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   All questions were answered. The patient knows to call the clinic with any problems, questions or concerns. The total time spent in the appointment was 30 minutes encounter with patients including review of chart and various tests results, discussions about plan of care and coordination of care plan   Heath Lark, MD 02/24/2021 5:53 PM  INTERVAL HISTORY: Please see below for problem oriented  charting. 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 Please refer to report for other findings    11/10/2015 Procedure   she underwent CT guided biopsy    11/10/2015 Pathology Results   Accession: [id number] right lung CT-guided biopsy suspicious for lymphoma    12/02/2015 - 12/23/2015 Chemotherapy   She received 4 doses of weekly rituximab.    02/10/2016 PET scan   There is no evidence for residual or recurrent hypermetabolic adenopathy    9/79/8921 Imaging   The masslike area of concern in the medial right lung base seen on the previous study is unchanged in size since March 2017. A biopsy was previously recommended after the CT scan from October 26, 2015. Please see that report for further recommendations as the finding was better characterized at that time. 2. No pulmonary emboli. 3. Multiple thin walled cysts in the mid and lower lungs is stable. 4. 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    02/06/2017 PET scan   No evidence of active lymphomatous involvement. Stable 1.5  cm mixed cystic/ solid nodule in the right lower lobe, non FDG avid. Continued attention on follow-up is suggested.    02/13/2017 Procedure   Colonoscopy - One 8 mm polyp in the cecum, removed with a cold snare. Resected and retrieved. - Two 3 to 4 mm polyps in the transverse colon, removed with a  cold biopsy forceps. Resected and retrieved. - One 5 mm polyp in the descending colon, removed with a cold snare. Resected and retrieved. - Two 3 to 5 mm polyps at the recto-sigmoid colon, removed with a cold snare. Resected and retrieved. - Mild diverticulosis in the sigmoid colon. - Internal hemorrhoids.       02/06/2018 PET scan   Negative PET-CT. No findings for enlarged or hypermetabolic lymph nodes to suggest lymphoma.    02/24/2021 PET scan   1. Hypermetabolic nodules are nodes centered about the left parotid gland, most likely indicative of recurrent lymphoma-given clinical history. 2. No evidence of hypermetabolic adenopathy outside of the neck.  3. Similar low-level hypermetabolism corresponding to an area of vague left lower lobe increased density. 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 4. Incidental findings, including: Coronary artery atherosclerosis. Aortic Atherosclerosis (ICD10-I70.0). Tiny hiatal hernia.       REVIEW OF SYSTEMS:   Constitutional: Denies fevers, chills or abnormal weight loss Eyes: Denies blurriness of vision Ears, nose, mouth, throat, and face: Denies mucositis or sore throat Respiratory: Denies cough, dyspnea or wheezes Cardiovascular: Denies palpitation, chest discomfort or lower extremity swelling Gastrointestinal:  Denies nausea, heartburn or change in bowel habits Skin: Denies abnormal skin rashes Lymphatics: Denies new lymphadenopathy or easy bruising Neurological:Denies numbness, tingling or new weaknesses Behavioral/Psych: Mood is stable, no new changes  All other systems were reviewed with the patient and are negative.  I have reviewed the past medical history, past surgical history, social history and family history with the patient and they are unchanged from previous note.  ALLERGIES:  has No Known Allergies.  MEDICATIONS:  Current Outpatient Medications  Medication Sig Dispense Refill    acetaminophen (TYLENOL) 500 MG tablet Take 500 mg by mouth every 6 (six) hours as needed for moderate pain.     Calcium Carb-Cholecalciferol (CALCIUM 600-D PO) Take 1 tablet by mouth daily.     Calcium Carbonate-Vitamin D 600-200 MG-UNIT TABS Take 1 tablet by mouth daily.     Cholecalciferol (VITAMIN D3) 50 MCG (2000 UT) TABS Take 2 tablets by mouth daily.     COVID-19 mRNA vaccine, Pfizer, 30 MCG/0.3ML injection INJECT AS DIRECTED .3 mL 0   Magnesium 250 MG TABS Take 2 tablets by mouth daily.     methylPREDNISolone (MEDROL DOSEPAK) 4 MG TBPK tablet Take as directed 6 day dose pak. 21 tablet 0   Potassium 99 MG TABS Take 2 tablets by mouth daily.     simvastatin (ZOCOR) 20 MG tablet Take 20 mg by mouth at bedtime.      vitamin B-12 (CYANOCOBALAMIN) 1000 MCG tablet Take 1,000 mcg by mouth as needed.      No current facility-administered medications for this visit.    PHYSICAL EXAMINATION: ECOG PERFORMANCE STATUS: 1 - Symptomatic but completely ambulatory  Vitals:   02/24/21 1358  BP: (!) 159/63  Pulse: 79  Resp: 18  Temp: 98.1 F (36.7 C)  SpO2: 96%   Filed Weights   02/24/21 1358  Weight: 208 lb (94.3 kg)    GENERAL:alert, no distress and comfortable NEURO: alert & oriented x 3 with fluent speech, no  focal motor/sensory deficits  LABORATORY DATA:  I have reviewed the data as listed    Component Value Date/Time   NA 140 05/28/2020 0904   NA 143 11/25/2019 0842   NA 143 02/06/2017 1302   K 4.1 05/28/2020 0904   K 4.8 02/06/2017 1302   CL 106 05/28/2020 0904   CO2 22 05/28/2020 0904   CO2 26 02/06/2017 1302   GLUCOSE 121 (H) 05/28/2020 0904   GLUCOSE 100 02/06/2017 1302   BUN 11 05/28/2020 0904   BUN 14 11/25/2019 0842   BUN 14.2 02/06/2017 1302   CREATININE 0.67 05/28/2020 0904   CREATININE 0.8 02/06/2017 1302   CALCIUM 9.1 05/28/2020 0904   CALCIUM 9.7 02/06/2017 1302   PROT 7.1 05/28/2020 0904   PROT 7.2 01/28/2020 0911   PROT 7.8 02/06/2017 1302   ALBUMIN  3.9 05/28/2020 0904   ALBUMIN 4.5 01/28/2020 0911   ALBUMIN 3.9 02/06/2017 1302   AST 23 05/28/2020 0904   AST 20 02/06/2017 1302   ALT 22 05/28/2020 0904   ALT 25 02/06/2017 1302   ALKPHOS 83 05/28/2020 0904   ALKPHOS 105 02/06/2017 1302   BILITOT 0.6 05/28/2020 0904   BILITOT 0.3 01/28/2020 0911   BILITOT 0.41 02/06/2017 1302   GFRNONAA >60 05/28/2020 0904   GFRAA >60 02/09/2020 0818    No results found for: SPEP, UPEP  Lab Results  Component Value Date   WBC 7.3 02/09/2021   NEUTROABS 5.1 02/09/2021   HGB 12.3 02/09/2021   HCT 37.3 02/09/2021   MCV 89.0 02/09/2021   PLT 238 02/09/2021      Chemistry      Component Value Date/Time   NA 140 05/28/2020 0904   NA 143 11/25/2019 0842   NA 143 02/06/2017 1302   K 4.1 05/28/2020 0904   K 4.8 02/06/2017 1302   CL 106 05/28/2020 0904   CO2 22 05/28/2020 0904   CO2 26 02/06/2017 1302   BUN 11 05/28/2020 0904   BUN 14 11/25/2019 0842   BUN 14.2 02/06/2017 1302   CREATININE 0.67 05/28/2020 0904   CREATININE 0.8 02/06/2017 1302      Component Value Date/Time   CALCIUM 9.1 05/28/2020 0904   CALCIUM 9.7 02/06/2017 1302   ALKPHOS 83 05/28/2020 0904   ALKPHOS 105 02/06/2017 1302   AST 23 05/28/2020 0904   AST 20 02/06/2017 1302   ALT 22 05/28/2020 0904   ALT 25 02/06/2017 1302   BILITOT 0.6 05/28/2020 0904   BILITOT 0.3 01/28/2020 0911   BILITOT 0.41 02/06/2017 1302       RADIOGRAPHIC STUDIES: I have reviewed multiple imaging studies with the patient I have personally reviewed the radiological images as listed and agreed with the findings in the report. NM PET Image Restage (PS) Skull Base to Thigh  Result Date: 02/24/2021 CLINICAL DATA:  Subsequent treatment strategy for restaging of lymphoma. Chemotherapy in 2017. Colon cancer. Enlarged left-sided cervical node. EXAM: NUCLEAR MEDICINE PET SKULL BASE TO THIGH TECHNIQUE: 10.3 mCi F-18 FDG was injected intravenously. Full-ring PET imaging was performed from the skull  base to thigh after the radiotracer. CT data was obtained and used for attenuation correction and anatomic localization. Fasting blood glucose: 104 mg/dl COMPARISON:  07/08/2020 chest CT. 05/28/2020 abdominopelvic CT. Most recent PET 02/06/2018. FINDINGS: Mediastinal blood pool activity: SUV max 2.6 Liver activity: SUV max 3.9 NECK: A node or nodule about the medial left parotid measures 9 mm and a S.U.V. max of 5.0 on 21/4 versus 7  mm and not hypermetabolic on the prior. Nodule about the of more lateral and inferior left parotid measures 1.3 cm and a S.U.V. max of 8.1 on 25/4, enlarged from 9 mm on the prior. Hypermetabolism just deep to the left parotid is likely secondary to a small node. Example at a S.U.V. max of 3.6 on approximately image 30/4, new. No right cervical nodal activity. Incidental CT findings: Bilateral carotid atherosclerosis. CHEST: Vague dependent left lower lobe hyperdensity and hypermetabolism, including at a S.U.V. 3.0 on 92/4. (Previously 2.8.) No thoracic nodal hypermetabolism. Incidental CT findings: Emphysema. Chronic cystic lung disease has been detailed previously and is not significantly changed. No thoracic adenopathy Tiny hiatal hernia. ABDOMEN/PELVIS: No abdominopelvic parenchymal or nodal hypermetabolism. Incidental CT findings: Low-density bilateral renal lesions are likely cysts. Thoracoabdominal aortic atherosclerosis. Hysterectomy. Pelvic floor laxity. SKELETON: No abnormal marrow activity. Incidental CT findings: none IMPRESSION: 1. Hypermetabolic nodules are nodes centered about the left parotid gland, most likely indicative of recurrent lymphoma-given clinical history. 2. No evidence of hypermetabolic adenopathy outside of the neck. 3. Similar low-level hypermetabolism corresponding to an area of vague left lower lobe increased density. 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 4. Incidental findings, including: Coronary artery  atherosclerosis. Aortic Atherosclerosis (ICD10-I70.0). Tiny hiatal hernia. Electronically Signed   By: Abigail Miyamoto M.D.   On: 02/24/2021 12:40

## 2021-02-24 NOTE — Assessment & Plan Note (Signed)
She is symptomatic from Sjogren's syndrome She does not have a rheumatologist Discussed importance of rheumatology follow-up

## 2021-02-24 NOTE — Assessment & Plan Note (Signed)
She has persistent lung changes noted on PET CT scan I will defer to her pulmonologist for evaluation and treatment Currently, she is relatively asymptomatic

## 2021-02-24 NOTE — Assessment & Plan Note (Signed)
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

## 2021-02-27 ENCOUNTER — Ambulatory Visit: Payer: PPO | Admitting: Hematology and Oncology

## 2021-02-27 ENCOUNTER — Telehealth: Payer: Self-pay | Admitting: Pulmonary Disease

## 2021-02-27 NOTE — Telephone Encounter (Signed)
Pt is calling to get her CT chest scan scheduled stated she has missed two calls and wanted to get it arranged stated that she just did a PET scan because she found out she has cancer again. Pls regard; [phone]

## 2021-02-28 ENCOUNTER — Other Ambulatory Visit (HOSPITAL_COMMUNITY): Payer: PPO

## 2021-02-28 NOTE — Addendum Note (Signed)
Addended by: Elton Sin on: 02/28/2021 04:22 PM   Modules accepted: Orders

## 2021-02-28 NOTE — Telephone Encounter (Signed)
Recall placed for patient to get scheduled with Dr. Loanne Drilling 06/2021. Current CT order 11/22 cancelled. Called and spoke with Patient.  Patient aware of Dr. Cordelia Pen recommendations. Advised Patient recall was placed for 11/22 OV, but advised Patient to call office 9/22 or 10/22 to get scheduled for OV, with Dr. Loanne Drilling. Nothing further at this time.

## 2021-02-28 NOTE — Telephone Encounter (Signed)
CT order is placed for Nov 2022. Please advise.

## 2021-02-28 NOTE — Telephone Encounter (Signed)
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 No treatment at this point --Will arrange for CT Chest in 06/2021 and follow-up with me --If you develop symptoms of persistence shortness of breath, wheezing or cough in the absence of illness, please contact our office for evaluation. We will consider starting bronchodilators   Follow-up with me after CT Chest  CT scan is not due until November 2022 per the AVS from her last visit.   Called and spoke with patient. She is aware that she does not need a CT until November 2022. She had received 2 calls from our office but the person did not leave a VM but our number was on her phone. She thought maybe someone was calling to get scheduled for November 2022.   While on the phone, she wanted to let Dr. Loanne Drilling know that she had a PET scan on 02/24/21 that was ordered by Dr. Alvy Bimler. Will send to Dr. Loanne Drilling as a FYI.   Nothing further needed.

## 2021-02-28 NOTE — Telephone Encounter (Addendum)
Reviewed PET on 02/23/21. Per her Oncologist, she is asymptomatic from a respiratory standpoint. Cystic lung disease is stable on PET. We can push CT Chest without contrast to 02/2022. No order needed at this time.  Staff - please ensure patient has follow-up with me in November 2022. Cancel current CT order.  Rodman Pickle, M.D. [facility] Pulmonary/Critical Care Medicine 02/28/2021 1:26 PM

## 2021-03-01 DIAGNOSIS — Z1231 Encounter for screening mammogram for malignant neoplasm of breast: Secondary | ICD-10-CM | POA: Diagnosis not present

## 2021-03-02 ENCOUNTER — Ambulatory Visit: Payer: PPO | Admitting: Hematology and Oncology

## 2021-03-03 DIAGNOSIS — D37030 Neoplasm of uncertain behavior of the parotid salivary glands: Secondary | ICD-10-CM | POA: Diagnosis not present

## 2021-03-07 ENCOUNTER — Other Ambulatory Visit: Payer: Self-pay | Admitting: Otolaryngology

## 2021-03-27 ENCOUNTER — Encounter (HOSPITAL_BASED_OUTPATIENT_CLINIC_OR_DEPARTMENT_OTHER): Payer: Self-pay | Admitting: Otolaryngology

## 2021-03-27 ENCOUNTER — Other Ambulatory Visit: Payer: Self-pay

## 2021-03-27 NOTE — Progress Notes (Signed)
Chart reviewed by Dr. Sabra Heck, ok to proceed as planned with surgery at [facility].

## 2021-04-04 ENCOUNTER — Ambulatory Visit (HOSPITAL_BASED_OUTPATIENT_CLINIC_OR_DEPARTMENT_OTHER): Payer: PPO | Admitting: Certified Registered Nurse Anesthetist

## 2021-04-04 ENCOUNTER — Encounter (HOSPITAL_BASED_OUTPATIENT_CLINIC_OR_DEPARTMENT_OTHER): Payer: Self-pay | Admitting: Otolaryngology

## 2021-04-04 ENCOUNTER — Ambulatory Visit (HOSPITAL_BASED_OUTPATIENT_CLINIC_OR_DEPARTMENT_OTHER)
Admission: RE | Admit: 2021-04-04 | Discharge: 2021-04-05 | Disposition: A | Discharge status: Home / Self Care | Payer: PPO | Attending: Otolaryngology | Admitting: Otolaryngology

## 2021-04-04 ENCOUNTER — Encounter (HOSPITAL_BASED_OUTPATIENT_CLINIC_OR_DEPARTMENT_OTHER)
Admission: RE | Disposition: A | Discharge status: Home / Self Care | Payer: Self-pay | Source: Home / Self Care | Attending: Otolaryngology

## 2021-04-04 ENCOUNTER — Other Ambulatory Visit: Payer: Self-pay

## 2021-04-04 DIAGNOSIS — I10 Essential (primary) hypertension: Secondary | ICD-10-CM | POA: Diagnosis not present

## 2021-04-04 DIAGNOSIS — K1120 Sialoadenitis, unspecified: Secondary | ICD-10-CM | POA: Diagnosis not present

## 2021-04-04 DIAGNOSIS — Z9049 Acquired absence of other specified parts of digestive tract: Secondary | ICD-10-CM | POA: Diagnosis present

## 2021-04-04 DIAGNOSIS — F418 Other specified anxiety disorders: Secondary | ICD-10-CM | POA: Diagnosis not present

## 2021-04-04 DIAGNOSIS — Z9221 Personal history of antineoplastic chemotherapy: Secondary | ICD-10-CM | POA: Diagnosis not present

## 2021-04-04 DIAGNOSIS — D110 Benign neoplasm of parotid gland: Secondary | ICD-10-CM | POA: Diagnosis not present

## 2021-04-04 DIAGNOSIS — D37030 Neoplasm of uncertain behavior of the parotid salivary glands: Secondary | ICD-10-CM | POA: Diagnosis not present

## 2021-04-04 DIAGNOSIS — Z8572 Personal history of non-Hodgkin lymphomas: Secondary | ICD-10-CM | POA: Insufficient documentation

## 2021-04-04 DIAGNOSIS — E7800 Pure hypercholesterolemia, unspecified: Secondary | ICD-10-CM | POA: Diagnosis not present

## 2021-04-04 DIAGNOSIS — K118 Other diseases of salivary glands: Secondary | ICD-10-CM | POA: Diagnosis not present

## 2021-04-04 HISTORY — PX: PAROTIDECTOMY: SHX2163

## 2021-04-04 SURGERY — EXCISION, PAROTID GLAND
Anesthesia: General | Site: Face | Laterality: Left

## 2021-04-04 MED ORDER — AMISULPRIDE (ANTIEMETIC) 5 MG/2ML IV SOLN: 10.0000 mg | Freq: Once | INTRAVENOUS | Status: DC | PRN

## 2021-04-04 MED ORDER — DEXAMETHASONE SODIUM PHOSPHATE 10 MG/ML IJ SOLN
INTRAMUSCULAR | Status: DC | PRN
  Administered 2021-04-04: 10 mg via INTRAVENOUS

## 2021-04-04 MED ORDER — ONDANSETRON HCL 4 MG/2ML IJ SOLN: 4.0000 mg | Freq: Once | INTRAMUSCULAR | Status: DC | PRN

## 2021-04-04 MED ORDER — FENTANYL CITRATE (PF) 100 MCG/2ML IJ SOLN
INTRAMUSCULAR | Status: DC | PRN
  Administered 2021-04-04 (×4): 50 ug via INTRAVENOUS

## 2021-04-04 MED ORDER — SUCCINYLCHOLINE CHLORIDE 200 MG/10ML IV SOSY
PREFILLED_SYRINGE | INTRAVENOUS | Status: DC | PRN
  Administered 2021-04-04: 80 mg via INTRAVENOUS

## 2021-04-04 MED ORDER — LACTATED RINGERS IV SOLN: INTRAVENOUS | Status: DC

## 2021-04-04 MED ORDER — CEFAZOLIN SODIUM-DEXTROSE 2-3 GM-%(50ML) IV SOLR
INTRAVENOUS | Status: DC | PRN
  Administered 2021-04-04: 2 g via INTRAVENOUS

## 2021-04-04 MED ORDER — PROPOFOL 10 MG/ML IV BOLUS
INTRAVENOUS | Status: DC | PRN
  Administered 2021-04-04: 120 mg via INTRAVENOUS
  Administered 2021-04-04: 80 mg via INTRAVENOUS

## 2021-04-04 MED ORDER — LIDOCAINE HCL (PF) 2 % IJ SOLN
INTRAMUSCULAR | Status: AC
  Filled 2021-04-04: qty 5

## 2021-04-04 MED ORDER — OXYCODONE-ACETAMINOPHEN 5-325 MG PO TABS: 1.0000 | ORAL_TABLET | ORAL | Status: DC | PRN

## 2021-04-04 MED ORDER — SIMVASTATIN 20 MG PO TABS
20.0000 mg | ORAL_TABLET | Freq: Every day | ORAL | Status: DC
  Administered 2021-04-04: 20 mg via ORAL
  Filled 2021-04-04: qty 1

## 2021-04-04 MED ORDER — MORPHINE SULFATE (PF) 4 MG/ML IV SOLN: 2.0000 mg | INTRAVENOUS | Status: DC | PRN

## 2021-04-04 MED ORDER — ONDANSETRON HCL 4 MG/2ML IJ SOLN
INTRAMUSCULAR | Status: AC
  Filled 2021-04-04: qty 2

## 2021-04-04 MED ORDER — FENTANYL CITRATE (PF) 100 MCG/2ML IJ SOLN
INTRAMUSCULAR | Status: AC
  Filled 2021-04-04: qty 2

## 2021-04-04 MED ORDER — ACETAMINOPHEN 325 MG RE SUPP: 650.0000 mg | RECTAL | Status: DC | PRN

## 2021-04-04 MED ORDER — FENTANYL CITRATE (PF) 100 MCG/2ML IJ SOLN: 25.0000 ug | INTRAMUSCULAR | Status: DC | PRN

## 2021-04-04 MED ORDER — PHENYLEPHRINE HCL-NACL 20-0.9 MG/250ML-% IV SOLN
INTRAVENOUS | Status: DC | PRN
  Administered 2021-04-04: 50 ug/min via INTRAVENOUS

## 2021-04-04 MED ORDER — ACETAMINOPHEN 10 MG/ML IV SOLN: 1000.0000 mg | Freq: Once | INTRAVENOUS | Status: DC | PRN

## 2021-04-04 MED ORDER — ROCURONIUM BROMIDE 10 MG/ML (PF) SYRINGE
PREFILLED_SYRINGE | INTRAVENOUS | Status: AC
  Filled 2021-04-04: qty 10

## 2021-04-04 MED ORDER — DEXAMETHASONE SODIUM PHOSPHATE 10 MG/ML IJ SOLN
INTRAMUSCULAR | Status: AC
  Filled 2021-04-04: qty 1

## 2021-04-04 MED ORDER — PHENYLEPHRINE HCL (PRESSORS) 10 MG/ML IV SOLN
INTRAVENOUS | Status: AC
  Filled 2021-04-04: qty 2

## 2021-04-04 MED ORDER — EPHEDRINE SULFATE-NACL 50-0.9 MG/10ML-% IV SOSY
PREFILLED_SYRINGE | INTRAVENOUS | Status: DC | PRN
  Administered 2021-04-04 (×5): 10 mg via INTRAVENOUS

## 2021-04-04 MED ORDER — 0.9 % SODIUM CHLORIDE (POUR BTL) OPTIME
TOPICAL | Status: DC | PRN
Start: 2021-04-04 — End: 2021-04-04
  Administered 2021-04-04: 1000 mL

## 2021-04-04 MED ORDER — SUCCINYLCHOLINE CHLORIDE 200 MG/10ML IV SOSY
PREFILLED_SYRINGE | INTRAVENOUS | Status: AC
  Filled 2021-04-04: qty 10

## 2021-04-04 MED ORDER — ZOLPIDEM TARTRATE 5 MG PO TABS: 5.0000 mg | ORAL_TABLET | Freq: Every evening | ORAL | Status: DC | PRN

## 2021-04-04 MED ORDER — ONDANSETRON HCL 4 MG/2ML IJ SOLN
INTRAMUSCULAR | Status: DC | PRN
  Administered 2021-04-04: 4 mg via INTRAVENOUS

## 2021-04-04 MED ORDER — EPHEDRINE 5 MG/ML INJ
INTRAVENOUS | Status: AC
  Filled 2021-04-04: qty 10

## 2021-04-04 MED ORDER — LIDOCAINE 2% (20 MG/ML) 5 ML SYRINGE
INTRAMUSCULAR | Status: DC | PRN
  Administered 2021-04-04: 20 mg via INTRAVENOUS

## 2021-04-04 MED ORDER — LIDOCAINE-EPINEPHRINE 1 %-1:100000 IJ SOLN
INTRAMUSCULAR | Status: DC | PRN
  Administered 2021-04-04: 4.5 mL

## 2021-04-04 MED ORDER — KCL IN DEXTROSE-NACL 20-5-0.45 MEQ/L-%-% IV SOLN
INTRAVENOUS | Status: DC
  Filled 2021-04-04: qty 1000

## 2021-04-04 MED ORDER — CEFAZOLIN SODIUM 1 G IJ SOLR
INTRAMUSCULAR | Status: AC
  Filled 2021-04-04: qty 20

## 2021-04-04 MED ORDER — ACETAMINOPHEN 160 MG/5ML PO SOLN
650.0000 mg | ORAL | Status: DC | PRN
  Administered 2021-04-04: 650 mg via ORAL
  Filled 2021-04-04 (×2): qty 20.3

## 2021-04-04 SURGICAL SUPPLY — 59 items
ADH SKN CLS APL DERMABOND .7 (GAUZE/BANDAGES/DRESSINGS) ×1
APL SRG 3 HI ABS STRL LF PLS (MISCELLANEOUS) ×1
APPLICATOR DR MATTHEWS STRL (MISCELLANEOUS) ×2 IMPLANT
ATTRACTOMAT 16X20 MAGNETIC DRP (DRAPES) ×1 IMPLANT
BALL CTTN LRG ABS STRL LF (GAUZE/BANDAGES/DRESSINGS) ×1
BLADE SURG 15 STRL LF DISP TIS (BLADE) ×1 IMPLANT
BLADE SURG 15 STRL SS (BLADE) ×2
CANISTER SUCT 1200ML W/VALVE (MISCELLANEOUS) ×2 IMPLANT
CORD BIPOLAR FORCEPS 12FT (ELECTRODE) ×2 IMPLANT
COTTONBALL LRG STERILE PKG (GAUZE/BANDAGES/DRESSINGS) ×2 IMPLANT
COVER BACK TABLE 60X90IN (DRAPES) ×2 IMPLANT
COVER MAYO STAND STRL (DRAPES) ×2 IMPLANT
DECANTER SPIKE VIAL GLASS SM (MISCELLANEOUS) ×2 IMPLANT
DERMABOND ADVANCED (GAUZE/BANDAGES/DRESSINGS) ×1
DERMABOND ADVANCED .7 DNX12 (GAUZE/BANDAGES/DRESSINGS) ×1 IMPLANT
DRAIN CHANNEL 10F 3/8 F FF (DRAIN) ×1 IMPLANT
DRAPE SURG 17X23 STRL (DRAPES) ×1 IMPLANT
DRAPE U-SHAPE 76X120 STRL (DRAPES) ×2 IMPLANT
ELECT COATED BLADE 2.86 ST (ELECTRODE) ×2 IMPLANT
ELECT PAIRED SUBDERMAL (MISCELLANEOUS) ×2
ELECT REM PT RETURN 9FT ADLT (ELECTROSURGICAL) ×2
ELECTRODE PAIRED SUBDERMAL (MISCELLANEOUS) ×1 IMPLANT
ELECTRODE REM PT RTRN 9FT ADLT (ELECTROSURGICAL) ×1 IMPLANT
EVACUATOR SILICONE 100CC (DRAIN) ×1 IMPLANT
FORCEPS BIPOLAR SPETZLER 8 1.0 (NEUROSURGERY SUPPLIES) ×2 IMPLANT
GAUZE 4X4 16PLY ~~LOC~~+RFID DBL (SPONGE) ×4 IMPLANT
GLOVE SURG ENC MOIS LTX SZ6.5 (GLOVE) IMPLANT
GLOVE SURG ENC MOIS LTX SZ7.5 (GLOVE) ×3 IMPLANT
GOWN STRL REUS W/ TWL LRG LVL3 (GOWN DISPOSABLE) ×2 IMPLANT
GOWN STRL REUS W/TWL LRG LVL3 (GOWN DISPOSABLE) ×4
LOCATOR NERVE 3 VOLT (DISPOSABLE) ×1 IMPLANT
NDL HYPO 25X1 1.5 SAFETY (NEEDLE) ×1 IMPLANT
NEEDLE HYPO 25X1 1.5 SAFETY (NEEDLE) ×2 IMPLANT
NS IRRIG 1000ML POUR BTL (IV SOLUTION) ×2 IMPLANT
PACK BASIN DAY SURGERY FS (CUSTOM PROCEDURE TRAY) ×2 IMPLANT
PAD ALCOHOL SWAB (MISCELLANEOUS) ×4 IMPLANT
PENCIL SMOKE EVACUATOR (MISCELLANEOUS) ×2 IMPLANT
PIN SAFETY STERILE (MISCELLANEOUS) IMPLANT
PROBE NERVBE PRASS .33 (MISCELLANEOUS) ×2 IMPLANT
SET WALTER ACTIVATION W/DRAPE (SET/KITS/TRAYS/PACK) ×1 IMPLANT
SHEARS HARMONIC 9CM CVD (BLADE) ×2 IMPLANT
SLEEVE SCD COMPRESS KNEE MED (STOCKING) ×2 IMPLANT
SPONGE INTESTINAL PEANUT (DISPOSABLE) ×3 IMPLANT
STAPLER VISISTAT 35W (STAPLE) IMPLANT
SUT ETHILON 3 0 PS 1 (SUTURE) ×1 IMPLANT
SUT PROLENE 5 0 PS 2 (SUTURE) ×2 IMPLANT
SUT SILK 2 0 PERMA HAND 18 BK (SUTURE) ×6 IMPLANT
SUT SILK 2 0 TIES 17X18 (SUTURE)
SUT SILK 2-0 18XBRD TIE BLK (SUTURE) IMPLANT
SUT SILK 3 0 TIES 17X18 (SUTURE) ×2
SUT SILK 3-0 18XBRD TIE BLK (SUTURE) ×1 IMPLANT
SUT SILK 4 0 TIES 17X18 (SUTURE) IMPLANT
SUT VIC AB 3-0 FS2 27 (SUTURE) IMPLANT
SUT VICRYL 4-0 PS2 18IN ABS (SUTURE) ×2 IMPLANT
SYR BULB EAR ULCER 3OZ GRN STR (SYRINGE) ×2 IMPLANT
SYR CONTROL 10ML LL (SYRINGE) ×2 IMPLANT
TOWEL GREEN STERILE FF (TOWEL DISPOSABLE) ×3 IMPLANT
TRAY DSU PREP LF (CUSTOM PROCEDURE TRAY) ×2 IMPLANT
TUBE CONNECTING 20X1/4 (TUBING) ×2 IMPLANT

## 2021-04-04 NOTE — Anesthesia Preprocedure Evaluation (Signed)
Anesthesia Evaluation  Patient identified by MRN, date of birth, ID band Patient awake    Reviewed: Allergy & Precautions, NPO status , Patient's Chart, lab work & pertinent test results  Airway Mallampati: III  TM Distance: >3 FB Neck ROM: Full    Dental  (+) Missing   Pulmonary asthma ,    Pulmonary exam normal breath sounds clear to auscultation       Cardiovascular hypertension, Normal cardiovascular exam Rhythm:Regular Rate:Normal     Neuro/Psych  Headaches, PSYCHIATRIC DISORDERS Anxiety Depression  Neuromuscular disease    GI/Hepatic negative GI ROS, Neg liver ROS,   Endo/Other  Sjogren's disease   Renal/GU negative Renal ROS     Musculoskeletal  (+) Arthritis , Fibromyalgia -  Abdominal (+) + obese,   Peds  Hematology HLD   Anesthesia Other Findings LEFT PAROTID MASS  Reproductive/Obstetrics                             Anesthesia Physical Anesthesia Plan  ASA: 2  Anesthesia Plan: General   Post-op Pain Management:    Induction: Intravenous  PONV Risk Score and Plan: 3 and Ondansetron, Dexamethasone and Treatment may vary due to age or medical condition  Airway Management Planned: Oral ETT  Additional Equipment:   Intra-op Plan:   Post-operative Plan: Extubation in OR  Informed Consent: I have reviewed the patients History and Physical, chart, labs and discussed the procedure including the risks, benefits and alternatives for the proposed anesthesia with the patient or authorized representative who has indicated his/her understanding and acceptance.     Dental advisory given  Plan Discussed with: CRNA  Anesthesia Plan Comments: (LTA kit)        Anesthesia Quick Evaluation

## 2021-04-04 NOTE — Anesthesia Postprocedure Evaluation (Signed)
Anesthesia Post Note  Patient: [name]  Procedure(s) Performed: LEFT PAROTIDECTOMY (Left: Face)     Anesthesia Type: General Anesthetic complications: no   No notable events documented.  Last Vitals:  Vitals:   04/04/21 1545 04/04/21 1930  BP: (!) 155/63 (!) 163/60  Pulse: 89 96  Resp: 14 16  Temp: 36.7 C   SpO2: 92% 95%    Last Pain:  Vitals:   04/04/21 1930  TempSrc:   PainSc: 0-No pain                 Genetta Fiero

## 2021-04-04 NOTE — Anesthesia Postprocedure Evaluation (Signed)
Anesthesia Post Note  Patient: [name]  Procedure(s) Performed: LEFT PAROTIDECTOMY (Left: Face)     Patient location during evaluation: PACU Anesthesia Type: General Level of consciousness: awake and alert Pain management: pain level controlled Vital Signs Assessment: post-procedure vital signs reviewed and stable Respiratory status: spontaneous breathing, nonlabored ventilation, respiratory function stable and patient connected to nasal cannula oxygen Cardiovascular status: blood pressure returned to baseline and stable Postop Assessment: no apparent nausea or vomiting Anesthetic complications: no   No notable events documented.  Last Vitals:  Vitals:   04/04/21 1545 04/04/21 1930  BP: (!) 155/63 (!) 163/60  Pulse: 89 96  Resp: 14 16  Temp: 36.7 C   SpO2: 92% 95%    Last Pain:  Vitals:   04/04/21 1930  TempSrc:   PainSc: 0-No pain                 Dailynn Nancarrow

## 2021-04-04 NOTE — H&P (Signed)
Cc: Left parotid mass  HPI: The patient is a 80 year old female who presents today for evaluation of a new left parotid mass. The patient has a history of early non-Hodgkin's B-cell lymphoma. She underwent RIGHT parotidectomy and chemotherapy in 2016/2017. She recently had her yearly follow up with her oncologist with abnormal labs noted. Subsequent PET scan showed activity in the left parotid gland. Excisional biopsy was recommended by her oncologist. No dysphagia or facial weakness is noted. No other ENT, GI, or respiratory issue noted since the last visit.  Exam: General: Communicates without difficulty, well nourished, no acute distress. Head: Normocephalic, no evidence injury, no tenderness, facial buttresses intact without stepoff. Eyes: PERRL, EOMI. No scleral icterus, conjunctivae clear. Neuro: CN II exam reveals vision grossly intact. No nystagmus at any point of gaze. Ears: Auricles well formed without lesions. Ear canals are intact without mass or lesion. No erythema or edema is appreciated. The TMs are intact without fluid. Nose: External evaluation reveals normal support and skin without lesions. Dorsum is intact. Anterior rhinoscopy reveals healthy pink mucosa over anterior aspect of inferior turbinates and intact septum. No purulence noted. Oral:  Oral cavity and oropharynx are intact, symmetric, without erythema or edema. Mucosa is moist without lesions. Neck: Full range of motion without pain. There is no significant lymphadenopathy. Right parotid incision is well healed. Left parotid mass palpable. No skin erythema or drainage is noted. Trachea is midline. Neuro:  CN 2-12 grossly intact. Gait normal. Vestibular: No nystagmus at any point of gaze. The cerebellar examination is unremarkable.  Assessment  1. The patient is doing well s/p right parotidectomy. Pathology was consistent with early non-Hodgkin's B-cell lymphoma. 2. New left parotid mass. Her oncologist requested left excisional  biopsy instead of needle biopsy.  Plan  1. The physical exam findings are reviewed with the patient.  2. Plan left superficial parotidectomy. The risks, benefits, alternatives, and details of the procedure are extensively reviewed with the patient. Questions are invited and answered. 3. The patient is interested in proceeding with the procedure.  We will schedule the procedure in accordance with the patient's schedule.

## 2021-04-04 NOTE — Anesthesia Procedure Notes (Signed)
Procedure Name: Intubation Date/Time: 04/04/2021 9:14 AM Performed by: Genelle Bal, CRNA Pre-anesthesia Checklist: Patient identified, Emergency Drugs available, Suction available and Patient being monitored Patient Re-evaluated:Patient Re-evaluated prior to induction Oxygen Delivery Method: Circle system utilized Preoxygenation: Pre-oxygenation with 100% oxygen Induction Type: IV induction Ventilation: Mask ventilation without difficulty and Oral airway inserted - appropriate to patient size Laryngoscope Size: Glidescope and 3 Grade View: Grade I Tube type: Oral Tube size: 6.5 mm Number of attempts: 1 Airway Equipment and Method: Oral airway, LTA kit utilized, Therapist, art Placement Confirmation: ETT inserted through vocal cords under direct vision, positive ETCO2 and breath sounds checked- equal and bilateral Secured at: 21 cm Tube secured with: Tape Dental Injury: Teeth and Oropharynx as per pre-operative assessment  Difficulty Due To: Difficulty was unanticipated, Difficult Airway- due to reduced neck mobility, Difficult Airway- due to anterior larynx and Difficult Airway- due to immobile epiglottis Future Recommendations: Recommend- induction with short-acting agent, and alternative techniques readily available Comments: Dlx1 Mil 2 [employer], grade III view, very anterior larynx with immobile epiglottis, Glide #3 x1, grade I view, 2% lidocaine LTA, ATOI w/ 6.5 OETT, + BBS/etCo2

## 2021-04-04 NOTE — Discharge Instructions (Addendum)
Parotidectomy, Care After This sheet gives you information about how to care for yourself after your procedure. Your health care provider may also give you more specific instructions. If you have problems or questions, contact your health careprovider. What can I expect after the procedure? After the procedure, it is common to have: Pain and mild swelling at the incision site. Numbness along the incision. Numbness in part or all of your ear. Mild jaw discomfort on the surgical side when you are eating or chewing. This may last up to 2-4 weeks. Follow these instructions at home: Medicines Take over-the-counter and prescription medicines only as told by your health care provider. If you were prescribed an antibiotic medicine, take it as told by your health care provider. Do not stop taking the antibiotic even if you start to feel better. Ask your health care provider if the medicine prescribed to you: Requires you to avoid driving or using heavy machinery. Can cause constipation. You may need to take actions to prevent or treat constipation, such as: Drink enough fluid to keep your urine pale yellow. Take over-the-counter or prescription medicines. Eat foods that are high in fiber, such as beans, whole grains, and fresh fruits and vegetables. Limit foods that are high in fat and processed sugars, such as fried or sweet foods.  Incision care Follow instructions from your health care provider about how to take care of your incision. Make sure you: Leave stitches (sutures), skin glue, or adhesive strips in place. These skin closures may need to be in place for 2 weeks or longer. If adhesive strip edges start to loosen and curl up, you may trim the loose edges. Do not remove adhesive strips completely unless your health care provider tells you to do that. Check your incision area every day for signs of infection. Check for: More redness, swelling, or pain. Fluid or blood. Warmth. Pus or a bad  smell. Follow your health care provider's instructions about cleaning and maintaining the drain that was placed near your incision.  Eating and drinking Follow instructions from your health care provider about eating or drinking restrictions. If your mouth or jaw is sore, try eating soft foods until you feel better. Activity Return to your normal activities as told by your health care provider. Ask your health care provider what activities are safe for you. Rest as told by your health care provider. Avoid sitting for a long time without moving. Get up to take short walks every 1-2 hours. This is important to improve blood flow and breathing. Ask for help if you feel weak or unsteady. Do not lift anything that is heavier than 10 lb (4.5 kg), or the limit that you are told, until your health care provider says that it is safe. General instructions Keep your head raised (elevated) when you lie down during the first few weeks after surgery. This will help prevent increased swelling. Keep all follow-up visits as told by your health care provider. This is important. Contact a health care provider if: You have pain that does not get better with medicine. You have more redness, swelling, or pain around your incision. You have fluid or blood coming from your incision. Your incision feels warm to the touch. You have pus or a bad smell coming from your incision. You vomit or feel nauseous. You have a fever. Get help right away if: You have more pain, swelling, or redness that suddenly gets worse at the incision site. You have increasing numbness or weakness in  your face. You have severe pain. Summary After the procedure, it is common to have mild jaw discomfort on the surgical side when you are eating or chewing. This may last up to 2-4 weeks. Follow instructions from your health care provider about how to take care of your incision. If your mouth or jaw is sore, try eating soft foods until you  feel better. Return to your normal activities as told by your health care provider. Ask your health care provider what activities are safe for you. This information is not intended to replace advice given to you by your health care provider. Make sure you discuss any questions you have with your healthcare provider. Document Revised: 05/26/2018 Document Reviewed: 05/28/2018 Elsevier Patient Education  2022 [employer].

## 2021-04-04 NOTE — Op Note (Signed)
DATE OF PROCEDURE:  04/04/2021                              OPERATIVE REPORT  SURGEON:  [name], MD  PREOPERATIVE DIAGNOSES: 1. Left parotid mass  POSTOPERATIVE DIAGNOSES: 1. Left parotid mass  PROCEDURE PERFORMED: Left lateral parotidectomy with facial nerve dissection   ANESTHESIA:  General endotracheal tube anesthesia.   COMPLICATIONS:  None.   ESTIMATED BLOOD LOSS:  97m  INDICATION FOR PROCEDURE:  MZYON COZZENSis a 80y.o. female with a recently noted new left parotid mass. The patient has a history of early non-Hodgkin's B-cell lymphoma in the right parotid gland. She underwent right parotidectomy and chemotherapy in 2016/2017. She recently had her yearly follow up with her oncologist with abnormal labs noted. Subsequent PET scan showed activity in the left parotid gland. Excisional biopsy was recommended by her oncologist. Based on the above findings, the decision was made for the patient to undergo the above stated procedure. The risks, benefits, alternatives, and details of the procedure were discussed with the patient.  Questions were invited and answered.  Informed consent was obtained.  DESCRIPTION:  The patient was taken to the operating room and placed supine on the operating table.  General endotracheal tube anesthesia was administered by the anesthesiologist.  The patient was positioned and prepped and draped in a standard fashion for left parotidectomy surgery. Facial nerve monitoring electrodes were placed. The facial nerve monitoring system was functional throughout the case.    1% lidocaine with 1-100,000 epinephrine was infiltrated at the planned site of incision. A standard left  lateral parotidectomy incision was made. The incision was carried down to the level of the SMAS layer. The skin flap was elevated in a standard fashion. A palpable 1cm cystic mass was noted within the left parotid gland. Careful dissection was performed anterior to the left auricular  cartilage and along the left lateral neck. The main trunk of the facial nerve was identified. The facial nerve was carefully dissected free from the surrounding parotid tissue. All branches of the facial nerves were identified and preserved. The facial nerve bramches were functional throughout the case. The superficial lobe of the parotid gland was dissected free from the facial nerve. The specimen was sent fresh to the pathology department. The surgical site was copiously irrigated. A #10 JP drain was placed. The incision was closed in layers with 4-0 Vicryl, 5-0 Prolene, and Dermabond.    The care of the patient was turned over to the anesthesiologist.  The patient was awakened from anesthesia without difficulty.  She was extubated and transferred to the recovery room in good condition.   OPERATIVE FINDINGS:  Left parotid mass.   SPECIMEN:  Left lateral parotid lobe.   FOLLOWUP CARE:  The patient will be observed overnight.  The patient will follow up in my office in approximately 1 week.  Layana Konkel W Josy Peaden 04/04/2021 11:50 AM

## 2021-04-04 NOTE — Transfer of Care (Signed)
Immediate Anesthesia Transfer of Care Note  Patient: [name]  Procedure(s) Performed: LEFT PAROTIDECTOMY (Left: Face)  Patient Location: PACU  Anesthesia Type:General  Level of Consciousness: awake, alert  and oriented  Airway & Oxygen Therapy: Patient Spontanous Breathing and Patient connected to face mask oxygen  Post-op Assessment: Report given to RN and Post -op Vital signs reviewed and stable  Post vital signs: Reviewed and stable  Last Vitals:  Vitals Value Taken Time  BP 168/68 04/04/21 1134  Temp 36.9 C 04/04/21 1136  Pulse 96 04/04/21 1143  Resp 12 04/04/21 1143  SpO2 98 % 04/04/21 1143  Vitals shown include unvalidated device data.  Last Pain:  Vitals:   04/04/21 1136  TempSrc:   PainSc: Asleep      Patients Stated Pain Goal: 4 ([id number])  Complications: No notable events documented.

## 2021-04-05 MED ORDER — AMOXICILLIN 875 MG PO TABS: 875.0000 mg | ORAL_TABLET | Freq: Two times a day (BID) | ORAL | 0 refills | Status: AC

## 2021-04-05 NOTE — Discharge Summary (Signed)
Physician Discharge Summary  Patient ID: KARTIER MCQUIRE MRN: [id number] DOB/AGE: 1941-01-12 80 y.o.  Admit date: 04/04/2021 Discharge date: 04/05/2021  Admission Diagnoses: Left parotid mass  Discharge Diagnoses: Left parotid mass Active Problems:   H/O parotidectomy   Discharged Condition: good  Hospital Course: Pt had an uneventful overnight stay. Pt tolerated po well. No bleeding. No stridor. Facial nerve intact.  Consults: None  Significant Diagnostic Studies: None  Treatments: surgery: Left superficial parotidectomy  Discharge Exam: Blood pressure 130/65, pulse 81, temperature 97.9 F (36.6 C), resp. rate 16, height '5\' 9"'$  (1.753 m), weight 93.3 kg, SpO2 96 %. Incision/Wound:c/d/i Facial nerve intact.  Disposition: Discharge disposition: 01-Home or Self Care       Discharge Instructions     Activity as tolerated - No restrictions   Complete by: As directed    Diet general   Complete by: As directed    No wound care   Complete by: As directed       Allergies as of 04/05/2021   No Known Allergies      Medication List     TAKE these medications    acetaminophen 500 MG tablet Commonly known as: TYLENOL Take 500 mg by mouth every 6 (six) hours as needed for moderate pain.   amoxicillin 875 MG tablet Commonly known as: AMOXIL Take 1 tablet (875 mg total) by mouth 2 (two) times daily for 3 days.   CALCIUM 600-D PO Take 1 tablet by mouth daily.   Calcium Carbonate-Vitamin D 600-200 MG-UNIT Tabs Take 1 tablet by mouth daily.   Magnesium 250 MG Tabs Take 2 tablets by mouth daily.   methylPREDNISolone 4 MG Tbpk tablet Commonly known as: MEDROL DOSEPAK Take as directed 6 day dose pak.   Pfizer-BioNTech COVID-19 Vacc 30 MCG/0.3ML injection Generic drug: COVID-19 mRNA vaccine (Pfizer) INJECT AS DIRECTED   Potassium 99 MG Tabs Take 2 tablets by mouth daily.   simvastatin 20 MG tablet Commonly known as: ZOCOR Take 20 mg by mouth at bedtime.    vitamin B-12 1000 MCG tablet Commonly known as: CYANOCOBALAMIN Take 1,000 mcg by mouth as needed.   Vitamin D3 50 MCG (2000 UT) Tabs Take 2 tablets by mouth daily.        Follow-up Information     [name], MD Follow up on 04/11/2021.   Specialty: Otolaryngology Why: at [facility] information: [address] [place] [address] [phone]                 Signed: Burley Saver 04/05/2021, 10:21 AM

## 2021-04-06 ENCOUNTER — Encounter (HOSPITAL_BASED_OUTPATIENT_CLINIC_OR_DEPARTMENT_OTHER): Payer: Self-pay | Admitting: Otolaryngology

## 2021-04-10 LAB — SURGICAL PATHOLOGY

## 2021-04-13 ENCOUNTER — Telehealth: Payer: Self-pay

## 2021-04-13 NOTE — Telephone Encounter (Signed)
-----   Message from Heath Lark, MD sent at 04/13/2021 10:55 AM EDT ----- Pls call her Surgical pathology is negative for lymphoma; the changes seen is likely due to her sjogren's Please schedule appt in 1 year to see me with labs

## 2021-04-13 NOTE — Telephone Encounter (Signed)
Called and left a message asking her to call the office back. 

## 2021-04-13 NOTE — Telephone Encounter (Signed)
Called back and given below message. She verbalized understanding. Appt scheduled 02/12/22 per her request. She is aware of appts.

## 2021-05-09 DIAGNOSIS — B356 Tinea cruris: Secondary | ICD-10-CM | POA: Diagnosis not present

## 2021-05-09 DIAGNOSIS — E1169 Type 2 diabetes mellitus with other specified complication: Secondary | ICD-10-CM | POA: Diagnosis not present

## 2021-05-09 DIAGNOSIS — N950 Postmenopausal bleeding: Secondary | ICD-10-CM | POA: Diagnosis not present

## 2021-05-09 DIAGNOSIS — Z23 Encounter for immunization: Secondary | ICD-10-CM | POA: Diagnosis not present

## 2021-05-09 DIAGNOSIS — I10 Essential (primary) hypertension: Secondary | ICD-10-CM | POA: Diagnosis not present

## 2021-05-16 ENCOUNTER — Other Ambulatory Visit: Payer: Self-pay | Admitting: Family Medicine

## 2021-05-16 DIAGNOSIS — N950 Postmenopausal bleeding: Secondary | ICD-10-CM

## 2021-05-25 ENCOUNTER — Other Ambulatory Visit: Payer: PPO

## 2021-05-25 DIAGNOSIS — N950 Postmenopausal bleeding: Secondary | ICD-10-CM | POA: Diagnosis not present

## 2021-05-25 DIAGNOSIS — Z87442 Personal history of urinary calculi: Secondary | ICD-10-CM | POA: Insufficient documentation

## 2021-05-25 DIAGNOSIS — C8590 Non-Hodgkin lymphoma, unspecified, unspecified site: Secondary | ICD-10-CM | POA: Insufficient documentation

## 2021-05-29 ENCOUNTER — Other Ambulatory Visit: Payer: Self-pay

## 2021-05-29 ENCOUNTER — Ambulatory Visit (INDEPENDENT_AMBULATORY_CARE_PROVIDER_SITE_OTHER): Payer: PPO

## 2021-05-29 ENCOUNTER — Encounter: Payer: Self-pay | Admitting: Orthopedic Surgery

## 2021-05-29 ENCOUNTER — Ambulatory Visit: Payer: PPO | Admitting: Orthopedic Surgery

## 2021-05-29 VITALS — BP 151/84 | HR 76 | Ht 69.0 in | Wt 206.0 lb

## 2021-05-29 DIAGNOSIS — M65331 Trigger finger, right middle finger: Secondary | ICD-10-CM | POA: Insufficient documentation

## 2021-05-29 DIAGNOSIS — M79641 Pain in right hand: Secondary | ICD-10-CM

## 2021-05-29 MED ORDER — LIDOCAINE HCL 1 % IJ SOLN
1.0000 mL | INTRAMUSCULAR | Status: AC | PRN
  Administered 2021-05-29: 1 mL

## 2021-05-29 MED ORDER — BETAMETHASONE SOD PHOS & ACET 6 (3-3) MG/ML IJ SUSP
6.0000 mg | INTRAMUSCULAR | Status: AC | PRN
  Administered 2021-05-29: 6 mg via INTRA_ARTICULAR

## 2021-05-29 NOTE — Progress Notes (Signed)
Office Visit Note   Patient: [name]           Date of Birth: [birth year]           MRN: [id number] Visit Date: 05/29/2021              Requested by: Maurice Small, MD No address on file PCP: Maurice Small, MD (Inactive)   Assessment & Plan: Visit Diagnoses:  1. Pain of right hand   2. Trigger finger, right middle finger     Plan: Discussed with patient that she has multiple things going on with her right hand.  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 She also has what appears to be early triggering of this finger w/ a palpable nodule at the A1 pulley with pain in this area w/ finger extension after making a fist.  We discussed the treatment options for both osteoarthritis and trigger finger.  She wants to proceed with a CSI into the middle finger A1 pulley.  I can see her back in my office in 4-6 weeks if she doesn't have any symptom relief with this injection.   Follow-Up Instructions: No follow-ups on file.   Orders:  Orders Placed This Encounter  Procedures   XR Hand Complete Right   No orders of the defined types were placed in this encounter.     Procedures: Hand/UE Inj: R long A1 for trigger finger on 05/29/2021 11:53 AM Details: 25 G needle, volar approach Medications: 1 mL lidocaine 1 %; 6 mg betamethasone acetate-betamethasone sodium phosphate 6 (3-3) MG/ML Outcome: tolerated well, no immediate complications Procedure, treatment alternatives, risks and benefits explained, specific risks discussed. Patient was prepped and draped in the usual sterile fashion.      Clinical Data: No additional findings.   Subjective: Chief Complaint  Patient presents with   Right Hand - Pain    This is a 80 yo RHD F who presents with chronic pain involving the right middle finger.  She thinks her symptoms began several years ago when she used this hand to stop herself from  falling and "jammed" the index and middle fingers.  Since that time, she's developed a mild flexion contracture of the middle finger PIP joint.  She also describes recent shooting pains involving the middle finger that occur with use and with certain activities such as gripping a steering wheel.  She points to the PIP and MP joints of this finger as painful areas.  She denies any overt locking of this finger.  She has no had any treatment to date.    Review of Systems   Objective: Vital Signs: BP (!) 151/84 (BP Location: Left Arm, Patient Position: Sitting)   Pulse 76   Ht 5\' 9"  (1.753 m)   Wt 206 lb (93.4 kg)   BMI 30.42 kg/m   Physical Exam Constitutional:      Appearance: Normal appearance.  Cardiovascular:     Rate and Rhythm: Normal rate.     Pulses: Normal pulses.  Pulmonary:     Effort: Pulmonary effort is normal.  Skin:    General: Skin is warm and dry.     Capillary Refill: Capillary refill takes less than 2 seconds.  Neurological:     Mental Status: She is alert.    Right Hand Exam   Tenderness  Right hand tenderness location: TTP over middle finger A1 pulley.  Range of Motion  The  patient has normal right wrist ROM.   Muscle Strength  The patient has normal right wrist strength.  Other  Erythema: absent Sensation: normal Pulse: present  Comments:  Approx 10 deg flexion contracture of the middle finger PIP joint w/ flexion to approx 90 degrees. Palpable and painful nodule over middle finger A1 pulley w/ palpable catching but without overt locking.      Specialty Comments:  No specialty comments available.  Imaging: 3V of the right hand taken today are reviewed and interpreted by me.  They demonstrate scattered degenerative changes including thumb CMC OA.  She has OA involving multiple other joints including the index MP and several IP joints including the middle finger PIP joint.    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   Obstructive lung disease (generalized) ([place]) 11/07/2020   Abnormal lung scan 07/12/2020   Left-sided thoracic back pain 07/12/2020   Multiple complaints 02/09/2020   Coronary artery calcification 11/14/2019   Pure hypercholesterolemia 11/14/2019   Skin rash 02/14/2016   Left flank pain 02/14/2016   Hypersensitivity reaction 12/02/2015   Essential hypertension 09/15/2015   Pulmonary nodule 09/13/2015   History of colon cancer, stage I 08/31/2015   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   H/O superficial parotidectomy 08/01/2015   Other abnormal glucose 01/23/2013   Left-sided headache 01/15/2013   Chronic diarrhea 05/15/2012   Bruising 01/25/2012   Palpitations 11/12/2011   Atypical chest pain 11/12/2011   Preventative health care 05/15/2011   Angina pectoris ([place]) 02/02/2010   GERD 12/26/2009   CONTACT DERMATITIS 09/02/2007   DE QUERVAIN'S TENOSYNOVITIS 09/02/2007   MENINGIOMA 07/22/2007   DIVERTICULOSIS, COLON 07/22/2007   FIBROMYALGIA 07/22/2007   HYPERLIPIDEMIA 07/21/2007   SJOGREN'S SYNDROME 07/21/2007   Past Medical History:  Diagnosis Date   Anxiety    Arthritis    right thumb CMC arthritis   Asthmatic bronchitis 2017   Cancer ([place])    lymphoma   Cataract    bilateral small- removed both eyes    Colon cancer ([place])    stage 1- at appendiceal orifice    Complication of anesthesia    has dry mouth anyway from shograns-sore throats post op   Coronary artery calcification seen on CAT scan 11/14/2019   Depression    since 1982    Diverticulosis    Fibromyalgia    GERD (gastroesophageal reflux disease)    past hx    Heart murmur    Hyperlipidemia    controlled - metabolic syndrome per pt- goes up and down    Hypertension    Lung infiltrate on CT 09/14/2015   Meningioma ([place])  11/2009   right post parietal 49mm - stable 11/2009 MRI   Nephrolithiasis    Nephrolithiasis    Neuromuscular disorder ([place])    fibromyalgia   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   Pure hypercholesterolemia 11/14/2019   Sjogren's disease ([place])     Family History  Problem Relation Age of Onset   Colon cancer Mother 44   Colon cancer Father        unknown when onset was   Diabetes Neg Hx    Stomach cancer Neg Hx    Colon polyps Neg Hx    Esophageal cancer Neg Hx    Rectal  cancer Neg Hx     Past Surgical History:  Procedure Laterality Date   ABDOMINAL HYSTERECTOMY     APPENDECTOMY  2008   ARTERY BIOPSY  02/03/2013   Procedure: MINOR BIOPSY TEMPORAL ARTERY;  Surgeon: Ascencion Dike, MD;  Location: [facility];  Service: ENT;;   COLONOSCOPY     [place] CATH AND CORONARY ANGIOGRAPHY N/A 12/01/2019   Procedure: LEFT HEART CATH AND CORONARY ANGIOGRAPHY;  Surgeon: Troy Sine, MD;  Location: [facility];  Service: Cardiovascular;  Laterality: N/A;   MASS EXCISION  2008   abd-benign   PAROTIDECTOMY Right 08/01/2015   Procedure: RIGHT PAROTIDECTOMY;  Surgeon: [name], MD;  Location: [facility];  Service: ENT;  Laterality: Right;   PAROTIDECTOMY Left 04/04/2021   Procedure: LEFT PAROTIDECTOMY;  Surgeon: [name], MD;  Location: [facility];  Service: ENT;  Laterality: Left;   POLYPECTOMY     TONSILLECTOMY     VAGINAL HYSTERECTOMY  1989   Social History   Occupational History   Occupation: Retired    Fish farm manager: UNEMPLOYED  Tobacco Use   Smoking status: Never   Smokeless tobacco: Never   Tobacco comments:    Passive exposure through her mother.  Vaping Use   Vaping Use: Never used  Substance and Sexual Activity   Alcohol use: Yes    Alcohol/week: 0.0 standard drinks    Comment: Rarely   Drug use: No   Sexual activity: Not on file    Comment: Helen sister.

## 2021-06-23 ENCOUNTER — Telehealth: Payer: Self-pay | Admitting: Orthopedic Surgery

## 2021-06-23 NOTE — Telephone Encounter (Signed)
Patient called. Just wanted Dr. Tempie Donning to know that she is doing great.

## 2021-06-23 NOTE — Telephone Encounter (Signed)
FYI

## 2021-06-29 DIAGNOSIS — L821 Other seborrheic keratosis: Secondary | ICD-10-CM | POA: Diagnosis not present

## 2021-06-29 DIAGNOSIS — Z85828 Personal history of other malignant neoplasm of skin: Secondary | ICD-10-CM | POA: Diagnosis not present

## 2021-06-29 DIAGNOSIS — L308 Other specified dermatitis: Secondary | ICD-10-CM | POA: Diagnosis not present

## 2021-07-03 ENCOUNTER — Encounter: Payer: Self-pay | Admitting: Pulmonary Disease

## 2021-07-03 ENCOUNTER — Ambulatory Visit: Payer: PPO | Admitting: Pulmonary Disease

## 2021-07-03 ENCOUNTER — Other Ambulatory Visit: Payer: Self-pay

## 2021-07-03 VITALS — BP 156/70 | HR 78 | Temp 98.1°F | Ht 68.75 in | Wt 207.0 lb

## 2021-07-03 DIAGNOSIS — J984 Other disorders of lung: Secondary | ICD-10-CM

## 2021-07-03 DIAGNOSIS — R052 Subacute cough: Secondary | ICD-10-CM | POA: Diagnosis not present

## 2021-07-03 NOTE — Patient Instructions (Addendum)
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 Declined bronchodilators for now. --ORDER CT chest without contrast in 02/2022 --If you develop symptoms of persistent shortness of breath, wheezing or cough in the absence of illness, please call our office to evaluate for treatment  Follow-up with me in July 2023

## 2021-07-03 NOTE — Progress Notes (Signed)
Subjective:   PATIENT ID: [name] GENDER: female DOB: [birth year], MRN: [id number]   HPI  Chief Complaint  Patient presents with   Follow-up    Sjorgren's syndrome    Reason for Visit: Follow-up diffuse cystic lung disease  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  She completed PFTs on 11/01/20. No respiratory complaints. She denies shortness of breath or wheezing. She previously had a cough that has now resolved. Has dry mouth, dry eyes and fatigue related to Sjogren's.  07/03/21 Since our last visit, she reports a light dry cough in the last several weeks. Denies shortness of breath or wheezing. Attributes this to change in weather and after switching from air conditioning to heating in her home. She does not feel like this limits her activity or quality of life.   Her last PET in July demonstrated hypermetabolic nodules in the left parotid which led to biopsy which was fortunately negative for malignancy.  Social History: Never smoker Works at [employer] exposures: Radiation  Past Medical History:  Diagnosis Date   Anxiety    Arthritis    right thumb CMC arthritis   Asthmatic bronchitis 2017   Cancer ([place])    lymphoma   Cataract    bilateral small- removed both eyes    Colon cancer ([place])    stage 1- at appendiceal orifice    Complication of anesthesia    has dry mouth anyway from shograns-sore throats post op   Coronary artery calcification seen on CAT scan 11/14/2019   Depression    since 1982    Diverticulosis    Fibromyalgia    GERD (gastroesophageal reflux disease)    past hx    Heart murmur    Hyperlipidemia    controlled - metabolic syndrome per pt- goes up and down    Hypertension    Lung infiltrate on CT 09/14/2015   Meningioma ([place]) 11/2009   right post  parietal 38mm - stable 11/2009 MRI   Nephrolithiasis    Nephrolithiasis    Neuromuscular disorder ([place])    fibromyalgia   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     Calcium Carb-Cholecalciferol (CALCIUM 600-D PO) Take 1 tablet by mouth daily.     Calcium Carbonate-Vitamin D 600-200 MG-UNIT TABS Take 1 tablet by mouth daily.     Cholecalciferol (VITAMIN D3) 50 MCG (2000 UT) TABS Take 2 tablets by mouth daily.     Magnesium 250 MG TABS Take 2 tablets by mouth daily.     methylPREDNISolone (MEDROL DOSEPAK) 4 MG TBPK tablet Take as directed 6 day dose pak. 21 tablet 0   Potassium 99 MG TABS Take 2 tablets by mouth daily.     simvastatin (ZOCOR) 20 MG tablet Take 20 mg by mouth at bedtime.      vitamin B-12 (CYANOCOBALAMIN) 1000 MCG tablet Take 1,000 mcg by mouth as needed.      No facility-administered medications prior to visit.    Review of Systems  Constitutional:  Negative for chills,  diaphoresis, fever, malaise/fatigue and weight loss.  HENT:  Negative for congestion.   Respiratory:  Positive for cough. Negative for hemoptysis, sputum production, shortness of breath and wheezing.   Cardiovascular:  Negative for chest pain, palpitations and leg swelling.    Objective:   Vitals:   07/03/21 1009  BP: (!) 156/70  Pulse: 78  Temp: 98.1 F (36.7 C)  TempSrc: Oral  SpO2: 97%  Weight: 207 lb (93.9 kg)  Height: 5' 8.75" (1.746 m)   SpO2: 97 % O2 Device: None (Room air)  Physical Exam: General: Well-appearing, no acute distress HENT: [place], AT Eyes: EOMI, no scleral icterus Respiratory: Clear to auscultation bilaterally.  No crackles, wheezing or rales Cardiovascular: RRR, -M/R/G, no  JVD Extremities:-Edema,-tenderness Neuro: AAO x4, CNII-XII grossly intact Psych: Normal mood, normal affect   Data Reviewed:  Imaging: PET/CT 02/06/18 - Negative for hypermetabolic activity. Thin-walled cysts in mid and lower lungs bilaterally.  CT Chest 07/08/20 - Basilar predominance of thin-walled cysts of varying sizes. Some cysts with calcifications.  PET/CT 02/20/93 - Hypermetabolic nodules in left parotid. Similar low-level hypermetabolism in LLL concerning for inflammatory etiology. Stable cystic lung disease  PFT: 11/01/20 FVC 2.23 (67%) FEV1 1.75 (71%) Ratio 78  TLC 109% DLCO 71%. FVC increased by 12% and FEV1 increased by 13% Interpretation: Reversible obstructive defect present on spirometry. Increased RV and RV/TLC consistent with air trapping. Mildly reduced gas exchange  Labs: 10/18/15 ANA 1:1280 SSA >8 SSB>8 CCP neg  Echo: 12/08/19 EF 70-75%. Age-related DD. No WMA or valvular abnormalities.  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 Interval development of mild subacute cough. Discussed benefit and risk of bronchodilators. She declined treatment for now  Reversible obstructive lung disease Cough - new Mild symptoms. Declined bronchodilators for now. --If you develop symptoms of persistent shortness of breath, wheezing or cough in the absence of illness, please call our office   Diffuse cystic lung disease  Left lower lobe densities - hypermetabolic on 11/9673 PET suggestive of inflammatory procress --ORDER CT chest without contrast in 02/2022 to monitor annually for progression  Health Maintenance Immunization History  Administered Date(s) Administered   Influenza Split 05/15/2012, 04/28/2014, 05/21/2015   Influenza Whole 05/21/2007,  05/19/2008, 06/01/2010   Influenza, High Dose Seasonal PF 05/09/2021   Influenza,inj,Quad PF,6+ Mos 05/01/2016, 05/10/2017, 05/13/2018, 05/07/2019, 05/12/2020   Influenza,inj,quad, With Preservative 06/14/2015   PFIZER(Purple Top)SARS-COV-2 Vaccination 10/03/2019, 10/25/2019, 06/28/2020, 11/18/2020, 05/05/2021   Pneumococcal Conjugate-13 03/01/2015   Pneumococcal Polysaccharide-23 08/20/1998, 02/22/1999   Td 04/29/2006   Zoster, Live 06/23/2007   Orders Placed This Encounter  Procedures   CT Chest Wo Contrast    Standing Status:   Future    Standing Expiration Date:   07/03/2022    Scheduling Instructions:     Schedule CT around 02/2022    Order Specific Question:   Preferred imaging location?    Answer:   [facility]   No orders of the defined types were placed in this encounter.  Return in about 8 months (around 03/02/2022).  I have spent a total time of 33-minutes on the day of the appointment reviewing prior documentation, coordinating care and discussing medical diagnosis and plan with the patient/family. Past medical history, allergies, medications were reviewed. Pertinent imaging, labs and tests included in this note have been  reviewed and interpreted independently by me.  H[place], MD [facility] 07/03/2021 10:25 AM  Office Number [phone]

## 2021-07-12 ENCOUNTER — Encounter: Payer: Self-pay | Admitting: Gastroenterology

## 2021-07-12 ENCOUNTER — Ambulatory Visit (INDEPENDENT_AMBULATORY_CARE_PROVIDER_SITE_OTHER): Payer: PPO | Admitting: Gastroenterology

## 2021-07-12 VITALS — BP 160/100 | HR 87 | Ht 69.0 in | Wt 206.0 lb

## 2021-07-12 DIAGNOSIS — N939 Abnormal uterine and vaginal bleeding, unspecified: Secondary | ICD-10-CM | POA: Insufficient documentation

## 2021-07-12 DIAGNOSIS — N950 Postmenopausal bleeding: Secondary | ICD-10-CM | POA: Insufficient documentation

## 2021-07-12 NOTE — Progress Notes (Signed)
07/12/2021 [name] [id number] 1941-08-19   HISTORY OF PRESENT ILLNESS: This is an 80 year old female who is a patient of Dr. Vena Rua.  She follows here for personal history of adenomatous and sessile serrated polyps, family history of colon cancer, personal history of mucinous neoplasm of the appendix.  Last colonoscopy was July 2021 as listed below.  She is here today to discuss some bleeding issues.  She is adamant that she has not experienced any rectal bleeding.  She says that last October, 2021, she had a episode of large-volume bleeding that she believes was vaginal in nature.  She ended up going to the emergency department and had a CT scan of her abdomen and pelvis performed.  She ended up seeing urology and gynecology, but never got any definite answer as to what caused her bleeding.  She did not have any more issue until about a month ago when she saw a small amount of bright red blood on the toilet paper upon wiping.  This did not occur when she had a bowel movement and she is sure that it was not from her rectum.  She denies any GI complaints.  Colonoscopy 02/2020: - Two non-bleeding colonic angioectasias. - One 4 mm polyp in the ascending colon, removed with a cold snare. Resected and retrieved. - One 4 mm polyp in the transverse colon, removed with a cold snare. Resected and retrieved. - Diverticulosis in the sigmoid colon and in the descending colon. - Small internal hemorrhoids.  Tubular adenomas.   Past Medical History:  Diagnosis Date   Anxiety    Arthritis    right thumb CMC arthritis   Asthmatic bronchitis 2017   Cancer [facility])    lymphoma   Cataract    bilateral small- removed both eyes    Colon cancer ([place])    stage 1- at appendiceal orifice    Complication of anesthesia    has dry mouth anyway from shograns-sore throats post op   Coronary artery calcification seen on CAT scan 11/14/2019   Depression    since 1982    Diverticulosis     Fibromyalgia    GERD (gastroesophageal reflux disease)    past hx    Heart murmur    Hyperlipidemia    controlled - metabolic syndrome per pt- goes up and down    Hypertension    Lung infiltrate on CT 09/14/2015   Meningioma ([place]) 11/2009   right post parietal 57mm - stable 11/2009 MRI   Nephrolithiasis    Nephrolithiasis    Neuromuscular disorder ([place])    fibromyalgia   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[place]) 08/31/2015   Pure hypercholesterolemia 11/14/2019   Sjogren's disease ([place])    Past Surgical History:  Procedure Laterality Date   ABDOMINAL HYSTERECTOMY     APPENDECTOMY  2008   ARTERY BIOPSY  02/03/2013   Procedure: MINOR BIOPSY TEMPORAL ARTERY;  Surgeon: Ascencion Dike, MD;  Location: [facility];  Service: ENT;;   COLONOSCOPY     [place] CATH AND CORONARY ANGIOGRAPHY N/A 12/01/2019   Procedure: LEFT HEART CATH AND CORONARY ANGIOGRAPHY;  Surgeon: Troy Sine, MD;  Location: [facility];  Service: Cardiovascular;  Laterality: N/A;   MASS EXCISION  2008   abd-benign   PAROTIDECTOMY Right 08/01/2015   Procedure: RIGHT PAROTIDECTOMY;  Surgeon: [name], MD;  Location: [facility];  Service: ENT;  Laterality: Right;  PAROTIDECTOMY Left 04/04/2021   Procedure: LEFT PAROTIDECTOMY;  Surgeon: [name], MD;  Location: [facility];  Service: ENT;  Laterality: Left;   [place]    reports that she has never smoked. She has never used smokeless tobacco. She reports current alcohol use. She reports that she does not use drugs. family history includes Colon cancer in her father; Colon cancer (age of onset: 26) in her mother. No Known Allergies    Outpatient Encounter Medications as of 07/12/2021  Medication Sig   acetaminophen (TYLENOL) 500 MG tablet Take 500 mg by mouth every 6 (six) hours as needed for moderate pain.   Calcium  Carb-Cholecalciferol (CALCIUM 600-D PO) Take 1 tablet by mouth daily.   Calcium Carbonate-Vitamin D 600-200 MG-UNIT TABS Take 1 tablet by mouth daily.   Cholecalciferol (VITAMIN D3) 50 MCG (2000 UT) TABS Take 2 tablets by mouth daily.   Magnesium 250 MG TABS Take 2 tablets by mouth daily.   Potassium 99 MG TABS Take 2 tablets by mouth daily.   simvastatin (ZOCOR) 20 MG tablet Take 20 mg by mouth at bedtime.    vitamin B-12 (CYANOCOBALAMIN) 1000 MCG tablet Take 1,000 mcg by mouth as needed.    [DISCONTINUED] methylPREDNISolone (MEDROL DOSEPAK) 4 MG TBPK tablet Take as directed 6 day dose pak. (Patient not taking: Reported on 07/12/2021)   No facility-administered encounter medications on file as of 07/12/2021.     REVIEW OF SYSTEMS  : All other systems reviewed and negative except where noted in the History of Present Illness.   PHYSICAL EXAM: BP (!) 160/100   Pulse 87   Ht 5\' 9"  (1.753 m)   Wt 206 lb (93.4 kg)   SpO2 98%   BMI 30.42 kg/m  General: Well developed white female in no acute distress Head: Normocephalic and atraumatic Eyes:  Sclerae anicteric, conjunctiva pink. Ears: Normal auditory acuity Lungs: Clear throughout to auscultation; no W/R/R. Heart: Regular rate and rhythm; no M/R/G. Abdomen: Soft, non-distended.  BS present.  Non-tender. Musculoskeletal: Symmetrical with no gross deformities  Skin: No lesions on visible extremities Extremities: No edema  Neurological: Alert oriented x 4, grossly non-focal Psychological:  Alert and cooperative. Normal mood and affect  ASSESSMENT AND PLAN: *Vaginal bleeding: Adamantly denies rectal bleeding.  She had an episode of large-volume bleeding that she believes was vaginal in October 2021, but they never identified a specific source.  She had another very small episode on the toilet paper about a month ago.  She did see gynecology and urology, but they both reported no issues.  She has gotten her colonoscopies regularly and  denies any gastrointestinal complaints.  I advised her that I cannot provide any answer as to what caused her bleeding issues, but if her symptoms were to recur then maybe she should revisit her GYN and urology physicians.  If she is to have any rectal bleeding then we certainly can evaluate and address that.   CC:  Maurice Small, MD

## 2021-07-12 NOTE — Patient Instructions (Signed)
Follow up as needed.  If you are age 80 or older, your body mass index should be between 23-30. Your Body mass index is 30.42 kg/m. If this is out of the aforementioned range listed, please consider follow up with your Primary Care Provider.  If you are age 30 or younger, your body mass index should be between 19-25. Your Body mass index is 30.42 kg/m. If this is out of the aformentioned range listed, please consider follow up with your Primary Care Provider.   ________________________________________________________  The [facility] providers would like to encourage you to use [facility] to communicate with providers for non-urgent requests or questions.  Due to long hold times on the telephone, sending your provider a message by [facility] may be a faster and more efficient way to get a response.  Please allow 48 business hours for a response.  Please remember that this is for non-urgent requests.  _______________________________________________________

## 2021-07-17 NOTE — Progress Notes (Signed)
Addendum: Reviewed and agree with assessment and management plan. Markas Aldredge M, MD  

## 2021-07-31 DIAGNOSIS — Z6830 Body mass index (BMI) 30.0-30.9, adult: Secondary | ICD-10-CM | POA: Diagnosis not present

## 2021-07-31 DIAGNOSIS — D320 Benign neoplasm of cerebral meninges: Secondary | ICD-10-CM | POA: Diagnosis not present

## 2021-07-31 DIAGNOSIS — R030 Elevated blood-pressure reading, without diagnosis of hypertension: Secondary | ICD-10-CM | POA: Diagnosis not present

## 2021-08-15 DIAGNOSIS — G9389 Other specified disorders of brain: Secondary | ICD-10-CM | POA: Diagnosis not present

## 2021-08-15 DIAGNOSIS — G319 Degenerative disease of nervous system, unspecified: Secondary | ICD-10-CM | POA: Diagnosis not present

## 2021-08-15 DIAGNOSIS — D320 Benign neoplasm of cerebral meninges: Secondary | ICD-10-CM | POA: Diagnosis not present

## 2021-08-23 ENCOUNTER — Encounter: Payer: Self-pay | Admitting: Family Medicine

## 2021-08-23 ENCOUNTER — Ambulatory Visit (INDEPENDENT_AMBULATORY_CARE_PROVIDER_SITE_OTHER): Payer: PPO | Admitting: Family Medicine

## 2021-08-23 ENCOUNTER — Other Ambulatory Visit: Payer: Self-pay

## 2021-08-23 VITALS — BP 140/90 | HR 73 | Temp 98.0°F | Ht 69.0 in | Wt 207.1 lb

## 2021-08-23 DIAGNOSIS — L659 Nonscarring hair loss, unspecified: Secondary | ICD-10-CM | POA: Diagnosis not present

## 2021-08-23 DIAGNOSIS — M3500 Sicca syndrome, unspecified: Secondary | ICD-10-CM

## 2021-08-23 DIAGNOSIS — R5383 Other fatigue: Secondary | ICD-10-CM

## 2021-08-23 DIAGNOSIS — R030 Elevated blood-pressure reading, without diagnosis of hypertension: Secondary | ICD-10-CM | POA: Diagnosis not present

## 2021-08-23 DIAGNOSIS — R7309 Other abnormal glucose: Secondary | ICD-10-CM | POA: Diagnosis not present

## 2021-08-23 DIAGNOSIS — M797 Fibromyalgia: Secondary | ICD-10-CM

## 2021-08-23 DIAGNOSIS — E7800 Pure hypercholesterolemia, unspecified: Secondary | ICD-10-CM | POA: Diagnosis not present
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
## 2021-08-23 NOTE — Patient Instructions (Signed)
Welcome to [facility]! It was a pleasure meeting you today.  As discussed, Please schedule a 6 month follow up visit today.  Return for labs fasting Monitor blood pressures Call when need refills  PLEASE NOTE:  If you had any LAB tests please let us know if you have not heard back within a few days. You may see your results on MyChart before we have a chance to review them but we will give you a call once they are reviewed by [place]. If we ordered any REFERRALS today, please let us know if you have not heard from their office within the next week.  Let us know through MyChart if you are needing REFILLS, or have your pharmacy send [place] the request. You can also use MyChart to communicate with me or any office staff.  Please try these tips to maintain a healthy lifestyle:  Eat most of your calories during the day when you are active. Eliminate processed foods including packaged sweets (pies, cakes, cookies), reduce intake of potatoes, white bread, white pasta, and white rice. Look for whole grain options, oat flour or almond flour.  Each meal should contain half fruits/vegetables, one quarter protein, and one quarter carbs (no bigger than a computer mouse).  Cut down on sweet beverages. This includes juice, soda, and sweet tea. Also watch fruit intake, though this is a healthier sweet option, it still contains natural sugar! Limit to 3 servings daily.  Drink at least 1 glass of water with each meal and aim for at least 8 glasses per day  Exercise at least 150 minutes every week.

## 2021-08-24 ENCOUNTER — Other Ambulatory Visit (INDEPENDENT_AMBULATORY_CARE_PROVIDER_SITE_OTHER): Payer: PPO

## 2021-08-24 DIAGNOSIS — R7309 Other abnormal glucose: Secondary | ICD-10-CM

## 2021-08-24 DIAGNOSIS — L659 Nonscarring hair loss, unspecified: Secondary | ICD-10-CM | POA: Diagnosis not present

## 2021-08-24 DIAGNOSIS — R5383 Other fatigue: Secondary | ICD-10-CM

## 2021-08-24 DIAGNOSIS — E7800 Pure hypercholesterolemia, unspecified: Secondary | ICD-10-CM

## 2021-08-24 LAB — COMPREHENSIVE METABOLIC PANEL
ALT: 12 U/L (ref 0–35)
AST: 13 U/L (ref 0–37)
Albumin: 4 g/dL (ref 3.5–5.2)
Alkaline Phosphatase: 95 U/L (ref 39–117)
BUN: 14 mg/dL (ref 6–23)
CO2: 28 mEq/L (ref 19–32)
Calcium: 9.2 mg/dL (ref 8.4–10.5)
Chloride: 105 mEq/L (ref 96–112)
Creatinine, Ser: 0.65 mg/dL (ref 0.40–1.20)
GFR: 83.03 mL/min (ref >60.00)
Glucose, Bld: 105 mg/dL — ABNORMAL HIGH (ref 70–99)
Potassium: 4.9 mEq/L (ref 3.5–5.1)
Sodium: 141 mEq/L (ref 135–145)
Total Bilirubin: 0.5 mg/dL (ref 0.2–1.2)
Total Protein: 7 g/dL (ref 6.0–8.3)

## 2021-08-24 LAB — CBC
HCT: 36.8 % (ref 36.0–46.0)
Hemoglobin: 12.3 g/dL (ref 12.0–15.0)
MCHC: 33.4 g/dL (ref 30.0–36.0)
MCV: 88.7 fl (ref 78.0–100.0)
Platelets: 211 10*3/uL (ref 150.0–400.0)
RBC: 4.14 Mil/uL (ref 3.87–5.11)
RDW: 13.2 % (ref 11.5–15.5)
WBC: 5.8 10*3/uL (ref 4.0–10.5)

## 2021-08-24 LAB — LIPID PANEL
Cholesterol: 208 mg/dL — ABNORMAL HIGH (ref 0–200)
HDL: 54.2 mg/dL (ref >39.00)
LDL Cholesterol: 115 mg/dL — ABNORMAL HIGH (ref 0–99)
NonHDL: 153.35
Total CHOL/HDL Ratio: 4
Triglycerides: 194 mg/dL — ABNORMAL HIGH (ref 0.0–149.0)
VLDL: 38.8 mg/dL (ref 0.0–40.0)

## 2021-08-24 LAB — IBC PANEL
Iron: 106 ug/dL (ref 42–145)
Saturation Ratios: 30.4 % (ref 20.0–50.0)
TIBC: 348.6 ug/dL (ref 250.0–450.0)
Transferrin: 249 mg/dL (ref 212.0–360.0)

## 2021-08-24 LAB — HEMOGLOBIN A1C: Hgb A1c MFr Bld: 6.3 % (ref 4.6–6.5)

## 2021-08-24 LAB — TSH: TSH: 2.61 u[IU]/mL (ref 0.35–5.50)

## 2021-08-24 LAB — VITAMIN B12: Vitamin B-12: 275 pg/mL (ref 211–911)

## 2021-09-15 ENCOUNTER — Ambulatory Visit: Payer: PPO | Admitting: Orthopedic Surgery

## 2021-09-15 ENCOUNTER — Ambulatory Visit (INDEPENDENT_AMBULATORY_CARE_PROVIDER_SITE_OTHER): Payer: PPO

## 2021-09-15 ENCOUNTER — Encounter: Payer: Self-pay | Admitting: Orthopedic Surgery

## 2021-09-15 ENCOUNTER — Other Ambulatory Visit: Payer: Self-pay

## 2021-09-15 VITALS — BP 132/81 | HR 81

## 2021-09-15 DIAGNOSIS — M79641 Pain in right hand: Secondary | ICD-10-CM | POA: Diagnosis not present

## 2021-09-15 DIAGNOSIS — M65331 Trigger finger, right middle finger: Secondary | ICD-10-CM

## 2021-09-15 DIAGNOSIS — M152 Bouchard's nodes (with arthropathy): Secondary | ICD-10-CM | POA: Diagnosis not present

## 2021-09-15 MED ORDER — LIDOCAINE HCL 1 % IJ SOLN
1.0000 mL | INTRAMUSCULAR | Status: AC | PRN
  Administered 2021-09-15: 1 mL

## 2021-09-15 MED ORDER — BETAMETHASONE SOD PHOS & ACET 6 (3-3) MG/ML IJ SUSP
6.0000 mg | INTRAMUSCULAR | Status: AC | PRN
  Administered 2021-09-15: 6 mg via INTRA_ARTICULAR

## 2021-09-15 NOTE — Progress Notes (Signed)
Office Visit Note   Patient: [name]           Date of Birth: 12-31-1940           MRN: [id number] Visit Date: 09/15/2021              Requested by: Tawnya Crook, MD [place],  [place] [address] PCP: Tawnya Crook, MD   Assessment & Plan: Visit Diagnoses:  1. Pain of right hand   2. Trigger finger, right middle finger   3. 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  She had 1 corticosteroid injection which gave her significant symptom relief for nearly a month.  She would like to try 1 more steroid injection into the right middle finger A1 pulley.  We also discussed osteoarthritis of her middle finger PIP joint.  She has some stiffness with a flexion contracture.  She not interested in treating this at the moment as her biggest issue seems to be pain at the level of the A1 pulley.  I can see her back in the office after several months if she still having issues.  Follow-Up Instructions: No follow-ups on file.   Orders:  Orders Placed This Encounter  Procedures   XR Hand Complete Right   No orders of the defined types were placed in this encounter.     Procedures: Hand/UE Inj: R long A1 for trigger finger on 09/15/2021 10:24 AM Indications: tendon swelling and therapeutic Details: 25 G needle, volar approach Medications: 1 mL lidocaine 1 %; 6 mg betamethasone acetate-betamethasone sodium phosphate 6 (3-3) MG/ML Outcome: tolerated well, no immediate complications Immediately prior to procedure a time out was called to verify the correct patient, procedure, equipment, support staff and site/side marked as required.      Clinical Data: No additional findings.   Subjective: Chief Complaint  Patient presents with   Right Hand - Follow-up    This is a 81 yo RHD F who presents with  for follow up of pain involving the right middle finger.  I previously saw her in October at which time she was having early trigger symptoms involving the right middle finger with tenderness and pain at the A1 pulley.  Triggering corticosteroid junction of the right middle finger A1 pulley with significant symptom relief for nearly a month.  She describes continued pain at the level of the middle finger A1 pulley.  There is no frank locking or catching of the finger though she does have discomfort with range of motion.  She also has pain at the middle finger PIP joint with a slight flexion contracture.  This is not very painful for her as most of her symptoms seem to be coming from the level of the MP joint/A1 pulley.  She like to try 1 more corticosteroid injection into the middle finger A1 pulley given that this provided such symptom relief previously.  he thinks her symptoms began several years ago when she used this hand to stop herself from falling and "jammed" the index and middle fingers.  Since that time, she's developed a mild flexion contracture of the middle finger PIP joint.  She also describes recent shooting pains involving the middle finger that occur with use and with certain activities such as gripping a steering wheel.  She points to the PIP and MP  joints of this finger as painful areas.  She denies any overt locking of this finger.  She has no had any treatment to date.    Review of Systems   Objective: Vital Signs: BP 132/81 (BP Location: Left Arm, Patient Position: Sitting, Cuff Size: Large)    Pulse 81    SpO2 96%   Physical Exam  Right Hand Exam   Tenderness  Right hand tenderness location: TTP at A1 pulley of middle finger.  No TTP at middle finger PIP joint.  Other  Erythema: absent Sensation: normal Pulse: present  Comments:  Palpable nodule at middle finger A1 pulley. 30 degree flexion contracture of PIP joint of middle finger.  Middle finger ROM 30-100 degrees.       Specialty Comments:  No specialty comments available.  Imaging: No results found.   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   Obstructive lung disease (generalized) ([facility]) 11/07/2020   Abnormal lung scan 07/12/2020   Left-sided thoracic back pain 07/12/2020   Multiple complaints 02/09/2020   Coronary artery calcification 11/14/2019   Pure hypercholesterolemia 11/14/2019   Skin rash 02/14/2016   Left flank pain 02/14/2016   Hypersensitivity reaction 12/02/2015   Essential hypertension 09/15/2015   Pulmonary nodule 09/13/2015   History of colon cancer, stage I 08/31/2015   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   H/O superficial parotidectomy 08/01/2015   Other abnormal glucose 01/23/2013   Left-sided headache 01/15/2013   Chronic diarrhea 05/15/2012   Bruising 01/25/2012   Palpitations 11/12/2011   Atypical chest pain 11/12/2011   Preventative health care 05/15/2011   Angina pectoris ([facility] [place]) 02/02/2010   GERD 12/26/2009   CONTACT DERMATITIS 09/02/2007   DE QUERVAIN'S TENOSYNOVITIS 09/02/2007   MENINGIOMA 07/22/2007   DIVERTICULOSIS, COLON 07/22/2007   Fibromyalgia 07/22/2007   HYPERLIPIDEMIA 07/21/2007   SJOGREN'S SYNDROME 07/21/2007   Past Medical History:  Diagnosis Date   Anxiety    Arthritis    right thumb CMC arthritis   Asthmatic bronchitis 2017   Cancer ([facility])    -NHL R parotid   Cataract    bilateral small- removed both eyes    Colon cancer ([facility])    stage 1- at appendiceal orifice    Complication of anesthesia    has dry mouth anyway from shograns-sore throats post op   Coronary  artery calcification seen on CAT scan 11/14/2019   Depression    since 1982    Diverticulosis    Fibromyalgia    GERD (gastroesophageal reflux disease)    past hx    Heart murmur    Hyperlipidemia    controlled - metabolic syndrome per pt- goes up and down    Hypertension    Lung infiltrate on CT 09/14/2015   Meningioma ([facility]) 11/2009   right post parietal 69mm - stable 11/2009 MRI   Nephrolithiasis    Nephrolithiasis    Neuromuscular disorder ([facility] [facility])    fibromyalgia   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   Pure hypercholesterolemia 11/14/2019   Sjogren's disease ([facility])     Family History  Problem Relation Age of Onset   Colon cancer Mother 90  Colon cancer Father        unknown when onset was   Suicidality Son    Diabetes Neg Hx    Stomach cancer Neg Hx    Colon polyps Neg Hx    Esophageal cancer Neg Hx    Rectal cancer Neg Hx     Past Surgical History:  Procedure Laterality Date   ABDOMINAL HYSTERECTOMY     APPENDECTOMY  2008   ARTERY BIOPSY  02/03/2013   Procedure: MINOR BIOPSY TEMPORAL ARTERY;  Surgeon: Ascencion Dike, MD;  Location: [facility];  Service: ENT;;   COLONOSCOPY     [place] CATH AND CORONARY ANGIOGRAPHY N/A 12/01/2019   Procedure: LEFT HEART CATH AND CORONARY ANGIOGRAPHY;  Surgeon: Troy Sine, MD;  Location: [facility];  Service: Cardiovascular;  Laterality: N/A;   MASS EXCISION  2008   abd-benign   PAROTIDECTOMY Right 08/01/2015   Procedure: RIGHT PAROTIDECTOMY;  Surgeon: [name], MD;  Location: [facility];  Service: ENT;  Laterality: Right;   PAROTIDECTOMY Left 04/04/2021   Procedure: LEFT PAROTIDECTOMY;  Surgeon: [name], MD;  Location: [facility];  Service: ENT;  Laterality: Left;   POLYPECTOMY     TONSILLECTOMY     VAGINAL HYSTERECTOMY  1989   Social History   Occupational History   Occupation: Retired    Fish farm manager: UNEMPLOYED  Tobacco  Use   Smoking status: Never   Smokeless tobacco: Never   Tobacco comments:    Passive exposure through her mother.  Vaping Use   Vaping Use: Never used  Substance and Sexual Activity   Alcohol use: Yes    Alcohol/week: 0.0 standard drinks    Comment: Rarely   Drug use: No   Sexual activity: Not Currently    Comment: [place] sister.

## 2021-10-03 ENCOUNTER — Encounter: Payer: Self-pay | Admitting: Family Medicine

## 2021-10-03 ENCOUNTER — Ambulatory Visit (INDEPENDENT_AMBULATORY_CARE_PROVIDER_SITE_OTHER): Payer: PPO | Admitting: Family Medicine

## 2021-10-03 ENCOUNTER — Other Ambulatory Visit: Payer: Self-pay

## 2021-10-03 VITALS — BP 150/82 | HR 78 | Temp 97.5°F | Ht 69.0 in | Wt 207.5 lb

## 2021-10-03 DIAGNOSIS — M5481 Occipital neuralgia: Secondary | ICD-10-CM

## 2021-10-03 DIAGNOSIS — I10 Essential (primary) hypertension: Secondary | ICD-10-CM | POA: Diagnosis not present

## 2021-10-03 MED ORDER — LISINOPRIL 10 MG PO TABS: 10.0000 mg | ORAL_TABLET | Freq: Every day | ORAL | 0 refills | Status: DC

## 2021-10-03 NOTE — Progress Notes (Signed)
Subjective:     Patient ID: [name], female    DOB: 17-Mar-1941, 81 y.o.   MRN: [id number]  Chief Complaint  Patient presents with   Referral    Need a referral to neurology for neck pain    HPI Neck pain-saw Dr. Frankey Shown).  Has meningioma "back of head"-followed for 4 yrs, but stable.  Few wks ago, "strange" sensations L side of scalp to neck and saw him.   PA saw her and told neuralgia.  And going down to neck.  Pt googled it and said-yes it is occipital neuralgia.    Symptoms intermitt.  Tried to sch w/neuro, but told needs referral. No visual changes/dizziness, etc. Dr. Lutricia Feil neuro.  Pain not severe yet, but there.  Health Maintenance Due  Topic Date Due   FOOT EXAM  Never done   OPHTHALMOLOGY EXAM  Never done   URINE MICROALBUMIN  Never done   Zoster Vaccines- Shingrix (1 of 2) Never done   Pneumonia Vaccine 54+ Years old (8 - PPSV23 if available, else PCV20) 02/29/2016   TETANUS/TDAP  04/29/2016   MAMMOGRAM  01/26/2017   COVID-19 Vaccine (6 - Booster for [employer] series) 06/30/2021    Past Medical History:  Diagnosis Date   Anxiety    Arthritis    right thumb CMC arthritis   Asthmatic bronchitis 2017   Cancer ([facility])    -NHL R parotid   Cataract    bilateral small- removed both eyes    Colon cancer ([facility])    stage 1- at appendiceal orifice    Complication of anesthesia    has dry mouth anyway from shograns-sore throats post op   Coronary artery calcification seen on CAT scan 11/14/2019   Depression    since 1982    Diverticulosis    Fibromyalgia    GERD (gastroesophageal reflux disease)    past hx    Heart murmur    Hyperlipidemia    controlled - metabolic syndrome per pt- goes up and down    Hypertension    Lung infiltrate on CT 09/14/2015   Meningioma ([facility]) 11/2009   right post parietal 66mm - stable 11/2009 MRI   Nephrolithiasis    Nephrolithiasis    Neuromuscular disorder ([facility])    fibromyalgia   Non-Hodgkin lymphoma of lymph  nodes of head ([facility]) 08/31/2015   Pure hypercholesterolemia 11/14/2019   Sjogren's disease ([facility])     Past Surgical History:  Procedure Laterality Date   ABDOMINAL HYSTERECTOMY     APPENDECTOMY  2008   ARTERY BIOPSY  02/03/2013   Procedure: MINOR BIOPSY TEMPORAL ARTERY;  Surgeon: Ascencion Dike, MD;  Location: [facility];  Service: ENT;;   COLONOSCOPY     [place] CATH AND CORONARY ANGIOGRAPHY N/A 12/01/2019   Procedure: LEFT HEART CATH AND CORONARY ANGIOGRAPHY;  Surgeon: Troy Sine, MD;  Location: [facility];  Service: Cardiovascular;  Laterality: N/A;   MASS EXCISION  2008   abd-benign   PAROTIDECTOMY Right 08/01/2015   Procedure: RIGHT PAROTIDECTOMY;  Surgeon: [name], MD;  Location: [facility];  Service: ENT;  Laterality: Right;   PAROTIDECTOMY Left 04/04/2021   Procedure: LEFT PAROTIDECTOMY;  Surgeon: [name], MD;  Location: [facility];  Service: ENT;  Laterality: Left;   [facility]    Outpatient Medications Prior to Visit  Medication  Sig Dispense Refill   acetaminophen (TYLENOL) 500 MG tablet Take 500 mg by mouth every 6 (six) hours as needed for moderate pain.     augmented betamethasone dipropionate (DIPROLENE-AF) 0.05 % cream Apply 1 a small amount to affected area twice a day     Calcium Carbonate-Vitamin D 600-200 MG-UNIT TABS Take 1 tablet by mouth daily.     Cholecalciferol (VITAMIN D3) 50 MCG (2000 UT) TABS Take 2 tablets by mouth daily.     clotrimazole-betamethasone (LOTRISONE) cream Apply topically 2 (two) times daily.     Cobalamin Combinations (VITAMIN B12-FOLIC ACID) 092-330 MCG TABS SMARTSIG:1 By Mouth     Magnesium 250 MG TABS Take 2 tablets by mouth daily.     PFIZER COVID-19 VAC BIVALENT injection      Potassium 99 MG TABS Take 2 tablets by mouth daily.     simvastatin (ZOCOR) 20 MG tablet Take 20 mg by mouth at bedtime.       No facility-administered medications prior to visit.    No Active Allergies QTM:[id number] except as noted in HPI Chronic cough      Objective:     BP (!) 150/82    Pulse 78    Temp (!) 97.5 F (36.4 C) (Temporal)    Ht 5\' 9"  (1.753 m)    Wt 207 lb 8 oz (94.1 kg)    SpO2 95%    BMI 30.64 kg/m  Wt Readings from Last 3 Encounters:  10/03/21 207 lb 8 oz (94.1 kg)  08/23/21 207 lb 2 oz (94 kg)  07/12/21 206 lb (93.4 kg)        Gen: WDWN NAD OWF HEENT: NCAT, conjunctiva not injected, sclera nonicteric NECK:  supple, no thyromegaly, no nodes, no carotid bruits CARDIAC: RRR, S1S2+, no murmur. DP 2+B MSK: no gross abnormalities. Some tenderness L neck/scalp near ear.   NEURO: A&O x3.  CN II-XII intact. EOMI. PSYCH: normal mood. Good eye contact  Assessment & Plan:   Problem List Items Addressed This Visit       Cardiovascular and Mediastinum   Essential hypertension   Other Visit Diagnoses     Cervico-occipital neuralgia of left side    -  Primary     1  occicipat neuralgia L-stretches.  Refer neuro.  Pt req [place].  Had MRI few wks ago-I dont see results 2.  HTN-no amlodipine as on simvastatin.  Will try lisinopril.  Monitor.  F/u 1 mo  No orders of the defined types were placed in this encounter.   Wellington Hampshire, MD

## 2021-10-03 NOTE — Patient Instructions (Signed)
It was very nice to see you today!  Take meds for blood pressure.     PLEASE NOTE:  If you had any lab tests please let us know if you have not heard back within a few days. You may see your results on MyChart before we have a chance to review them but we will give you a call once they are reviewed by [place]. If we ordered any referrals today, please let us know if you have not heard from their office within the next week.   Please try these tips to maintain a healthy lifestyle:  Eat most of your calories during the day when you are active. Eliminate processed foods including packaged sweets (pies, cakes, cookies), reduce intake of potatoes, white bread, white pasta, and white rice. Look for whole grain options, oat flour or almond flour.  Each meal should contain half fruits/vegetables, one quarter protein, and one quarter carbs (no bigger than a computer mouse).  Cut down on sweet beverages. This includes juice, soda, and sweet tea. Also watch fruit intake, though this is a healthier sweet option, it still contains natural sugar! Limit to 3 servings daily.  Drink at least 1 glass of water with each meal and aim for at least 8 glasses per day  Exercise at least 150 minutes every week.

## 2021-10-10 DIAGNOSIS — H9202 Otalgia, left ear: Secondary | ICD-10-CM | POA: Diagnosis not present

## 2021-10-17 DIAGNOSIS — D320 Benign neoplasm of cerebral meninges: Secondary | ICD-10-CM | POA: Diagnosis not present

## 2021-10-17 DIAGNOSIS — M792 Neuralgia and neuritis, unspecified: Secondary | ICD-10-CM | POA: Diagnosis not present

## 2021-10-18 ENCOUNTER — Telehealth: Payer: Self-pay | Admitting: Family Medicine

## 2021-10-18 NOTE — Telephone Encounter (Signed)
Patient was advised of the below message and verbalized understanding. Patient stated that she has been keeping a log for her upcoming visit. Patient stated that she did recheck twice after getting home and the systolic was back to her normal range 130's to 150's.  ?

## 2021-10-18 NOTE — Telephone Encounter (Signed)
Patient stated that she saw Melburn Popper, MD, neurology on yesterday and at her visit her systolic blood pressure number was 103. Patient stated at first she wasn't sure what she heard because of the accent, but called back today and confirmed that it was 103. Patient stated that number has never been that low before and she was wondering why. Patient denied any dizziness, headache or any symptoms, just wanted to let PCP know.  ?

## 2021-10-18 NOTE — Telephone Encounter (Signed)
Pt stated she went to urologist yesterday and the MA there told her, her systolic bp was 641- call transferred to Q at ext [phone].  ?

## 2021-10-24 DIAGNOSIS — H04123 Dry eye syndrome of bilateral lacrimal glands: Secondary | ICD-10-CM | POA: Diagnosis not present

## 2021-10-24 DIAGNOSIS — Z961 Presence of intraocular lens: Secondary | ICD-10-CM | POA: Diagnosis not present

## 2021-10-31 ENCOUNTER — Encounter: Payer: Self-pay | Admitting: Family Medicine

## 2021-10-31 ENCOUNTER — Ambulatory Visit (INDEPENDENT_AMBULATORY_CARE_PROVIDER_SITE_OTHER): Payer: PPO | Admitting: Family Medicine

## 2021-10-31 VITALS — BP 152/60 | HR 76 | Temp 97.5°F | Ht 69.0 in | Wt 203.1 lb

## 2021-10-31 DIAGNOSIS — I10 Essential (primary) hypertension: Secondary | ICD-10-CM

## 2021-10-31 LAB — BASIC METABOLIC PANEL
BUN: 13 mg/dL (ref 6–23)
CO2: 30 mEq/L (ref 19–32)
Calcium: 10.3 mg/dL (ref 8.4–10.5)
Chloride: 100 mEq/L (ref 96–112)
Creatinine, Ser: 0.65 mg/dL (ref 0.40–1.20)
GFR: 82.92 mL/min (ref >60.00)
Glucose, Bld: 101 mg/dL — ABNORMAL HIGH (ref 70–99)
Potassium: 5.3 mEq/L — ABNORMAL HIGH (ref 3.5–5.1)
Sodium: 136 mEq/L (ref 135–145)

## 2021-10-31 MED ORDER — LOSARTAN POTASSIUM 50 MG PO TABS: 50.0000 mg | ORAL_TABLET | Freq: Every day | ORAL | 1 refills | Status: DC

## 2021-10-31 NOTE — Progress Notes (Signed)
? ?Subjective:  ? ? ? Patient ID: [name], female    DOB: Jul 22, 1941, 81 y.o.   MRN: [id number] ? ?Chief Complaint  ?Patient presents with  ? Follow-up  ?  4 week follow-up on HTN  ? ? ?HPI ? HTN-wasn't well controlled.  Added Lisinopril. Bp's were 114-153/62-70's.  Then bought car and upset and bp's up more.  Past few days 160's/81.   Mad.  No ha/dizziness/CP/edema/sob.  But "overall feels bad"-A lot of stressors  Just worn out.  Cough getting worse-has ILD ?Has insurance that will cover some rx supps(list was more high dose D,cerafolin).  Takes calcium, Mg, D3, K. ? ?This am-some mild blood when blew nose.   Upset about buying car and extra expenses added. ? ?Health Maintenance Due  ?Topic Date Due  ? FOOT EXAM  Never done  ? OPHTHALMOLOGY EXAM  Never done  ? Pneumonia Vaccine 56+ Years old (5 - PPSV23 if available, else PCV20) 02/29/2016  ? TETANUS/TDAP  04/29/2016  ? MAMMOGRAM  01/26/2017  ? Zoster Vaccines- Shingrix (2 of 2) 08/09/2020  ? ? ?Past Medical History:  ?Diagnosis Date  ? Anxiety   ? Arthritis   ? right thumb CMC arthritis  ? Asthmatic bronchitis 2017  ? Cancer [facility])   ? -NHL R parotid  ? Cataract   ? bilateral small- removed both eyes   ? Colon cancer ([place])   ? stage 1- at appendiceal orifice   ? Complication of anesthesia   ? has dry mouth anyway from shograns-sore throats post op  ? Coronary artery calcification seen on CAT scan 11/14/2019  ? Depression   ? since 1982   ? Diverticulosis   ? Fibromyalgia   ? GERD (gastroesophageal reflux disease)   ? past hx   ? Heart murmur   ? Hyperlipidemia   ? controlled - metabolic syndrome per pt- goes up and down   ? Hypertension   ? Lung infiltrate on CT 09/14/2015  ? Meningioma ([place]) 11/2009  ? right post parietal 23m - stable 11/2009 MRI  ? Nephrolithiasis   ? Nephrolithiasis   ? Neuromuscular disorder ([place]   ? fibromyalgia  ? 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 Pure hypercholesterolemia 11/14/2019  ? Sjogren's disease  ([place]   ? ? ?Past Surgical History:  ?Procedure Laterality Date  ? ABDOMINAL HYSTERECTOMY    ? APPENDECTOMY  2008  ? ARTERY BIOPSY  02/03/2013  ? Procedure: MINOR BIOPSY TEMPORAL ARTERY;  Surgeon: SAscencion Dike MD;  [facility] [place]  Service: ENT;;  ? COLONOSCOPY    ? [place] ? LEFT HEART CATH AND CORONARY ANGIOGRAPHY N/A 12/01/2019  ? Procedure: LEFT HEART CATH AND CORONARY ANGIOGRAPHY;  Surgeon: KTroy Sine MD;  [facility];  Service: Cardiovascular;  Laterality: N/A;  ? MASS EXCISION  2008  ? abd-benign  ? PAROTIDECTOMY Right 08/01/2015  ? Procedure: RIGHT PAROTIDECTOMY;  Surgeon: SLeta Baptist MD;  [place] [facility]  Service: ENT;  Laterality: Right;  ? PAROTIDECTOMY Left 04/04/2021  ? Procedure: LEFT PAROTIDECTOMY;  Surgeon: TLeta Baptist MD;  [place] [facility]  Service: ENT;  Laterality: Left;  ? POLYPECTOMY    ? TONSILLECTOMY    ? VAGINAL HYSTERECTOMY  1989  ? ? ?Outpatient Medications Prior to Visit  ?Medication Sig Dispense Refill  ? acetaminophen (TYLENOL) 500 MG tablet Take 500 mg by mouth every 6 (six) hours  as needed for moderate pain.    ? augmented betamethasone dipropionate (DIPROLENE-AF) 0.05 % cream Apply 1 a small amount to affected area twice a day    ? Calcium Carbonate-Vitamin D 600-200 MG-UNIT TABS Take 1 tablet by mouth daily.    ? Cholecalciferol (VITAMIN D3) 50 MCG (2000 UT) TABS Take 2 tablets by mouth daily.    ? clotrimazole-betamethasone (LOTRISONE) cream Apply topically 2 (two) times daily.    ? Cobalamin Combinations (VITAMIN B12-FOLIC ACID) 938-101 MCG TABS SMARTSIG:1 By Mouth    ? lisinopril (ZESTRIL) 10 MG tablet Take 1 tablet (10 mg total) by mouth daily. 90 tablet 0  ? Magnesium 250 MG TABS Take 2 tablets by mouth daily.    ? Potassium 99 MG TABS Take 2 tablets by mouth daily.    ? simvastatin (ZOCOR) 20 MG tablet Take 20 mg by mouth at bedtime.     ? PFIZER COVID-19 VAC BIVALENT injection   (Patient not taking: Reported on 10/31/2021)    ? ?No facility-administered medications prior to visit.  ? ? ?No Known Allergies ?ROS neg/noncontributory except as noted HPI/below ?Stress-not want meds.  Sister getting some dementia ? ?   ?Objective:  ?  ? ?BP (!) 160/84   Pulse 76   Temp (!) 97.5 ?F (36.4 ?C) (Temporal)   Ht '5\' 9"'$  (1.753 m)   Wt 203 lb 2 oz (92.1 kg)   SpO2 95%   BMI 30.00 kg/m?  ?Wt Readings from Last 3 Encounters:  ?10/31/21 203 lb 2 oz (92.1 kg)  ?10/03/21 207 lb 8 oz (94.1 kg)  ?08/23/21 207 lb 2 oz (94 kg)  ? ? ?Physical Exam  ? ?Gen: WDWN NAD OWF ?HEENT: NCAT, conjunctiva not injected, sclera nonicteric ?NECK:  supple, no thyromegaly, no nodes, no carotid bruits ?CARDIAC: RRR, S1S2+, no murmur. DP 2+B ?LUNGS: CTAB. No wheezes ?EXT:  no edema ?MSK: no gross abnormalities.  ?NEURO: A&O x3.  CN II-XII intact.  ?PSYCH: normal mood. Good eye contact ? ?   ?Assessment & Plan:  ? ?Problem List Items Addressed This Visit   ? ?  ? Cardiovascular and Mediastinum  ? Essential hypertension - Primary  ? Relevant Orders  ? Basic metabolic panel  ? HTN-not well controlled and cough worse on lisinopril.  Change to losartan.  Monitor bp's . F/u 1 mo.  Check BMP d/t meds and pt taking K-may need to stop ? ?No orders of the defined types were placed in this encounter. ? ? ?Wellington Hampshire, MD ? ?

## 2021-10-31 NOTE — Patient Instructions (Signed)
It was very nice to see you today! ? ?Stop lisinopril-may be causing cough ?Start losartan.  Monitor blood pressures ? ? ?PLEASE NOTE: ? ?If you had any lab tests please let us know if you have not heard back within a few days. You may see your results on MyChart before we have a chance to review them but we will give you a call once they are reviewed by [place]. If we ordered any referrals today, please let us know if you have not heard from their office within the next week.  ? ?Please try these tips to maintain a healthy lifestyle: ? ?Eat most of your calories during the day when you are active. Eliminate processed foods including packaged sweets (pies, cakes, cookies), reduce intake of potatoes, white bread, white pasta, and white rice. Look for whole grain options, oat flour or almond flour. ? ?Each meal should contain half fruits/vegetables, one quarter protein, and one quarter carbs (no bigger than a computer mouse). ? ?Cut down on sweet beverages. This includes juice, soda, and sweet tea. Also watch fruit intake, though this is a healthier sweet option, it still contains natural sugar! Limit to 3 servings daily. ? ?Drink at least 1 glass of water with each meal and aim for at least 8 glasses per day ? ?Exercise at least 150 minutes every week.   ?

## 2021-11-06 ENCOUNTER — Telehealth: Payer: Self-pay | Admitting: Family Medicine

## 2021-11-06 NOTE — Telephone Encounter (Signed)
Copied from [place] [id number]. Topic: Medicare AWV ?>> Nov 06, 2021  1:13 PM Harris-Coley, Hannah Beat wrote: ?Reason for CRM: Left message for patient to schedule Annual Wellness Visit.  Please schedule with Nurse Health Advisor Charlott Rakes, RN at [facility].  Please call [phone] ask for Juliann Pulse ?

## 2021-11-07 ENCOUNTER — Ambulatory Visit: Payer: PPO | Admitting: Pulmonary Disease

## 2021-11-07 ENCOUNTER — Other Ambulatory Visit: Payer: Self-pay

## 2021-11-07 ENCOUNTER — Encounter: Payer: Self-pay | Admitting: Pulmonary Disease

## 2021-11-07 VITALS — BP 140/70 | HR 79 | Ht 69.0 in | Wt 203.6 lb

## 2021-11-07 DIAGNOSIS — J984 Other disorders of lung: Secondary | ICD-10-CM | POA: Diagnosis not present

## 2021-11-07 NOTE — Progress Notes (Signed)
? ? ?Subjective:  ? ?PATIENT ID: [name] GENDER: female DOB: 06/28/1941, MRN: [id number] ? ? ?HPI ? ?Chief Complaint  ?Patient presents with  ? Follow-up  ?  Cough has resolved it was bp medicine   ? ? ?Reason for Visit: Follow-up diffuse cystic lung disease ? ?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 ? ?07/03/21 ?Since our last visit, she reports a light dry cough in the last several weeks. Denies shortness of breath or wheezing. Attributes this to change in weather and after switching from air conditioning to heating in her home. She does not feel like this limits her activity or quality of life. Her last PET in July demonstrated hypermetabolic nodules in the left parotid which led to biopsy which was fortunately negative for malignancy. ? ?11/07/21 ?On her last visit she had a mild cough. She scheduled an appointment when her cough worsened but then resolved after discontinuing her ACEI three weeks ago. No shortness of breath or wheezing. ? ?Social History: ?Never smoker ?Works at [employer] ? ?Environmental exposures: Radiation ? ?Past Medical History:  ?Diagnosis Date  ? Anxiety   ? Arthritis   ? right thumb CMC arthritis  ? Asthmatic bronchitis 2017  ? Cancer [facility])   ? -NHL R parotid  ? Cataract   ? bilateral small- removed both eyes   ? Colon cancer ([place])   ? stage 1- at appendiceal orifice   ? Complication of anesthesia   ? has dry mouth anyway from shograns-sore throats post op  ? Coronary artery calcification seen on CAT scan 11/14/2019  ? Depression   ? since 1982   ? Diverticulosis   ? Fibromyalgia   ? GERD (gastroesophageal reflux disease)   ? past hx   ? Heart murmur   ? Hyperlipidemia   ? controlled - metabolic syndrome per pt- goes up and down   ? Hypertension   ? Lung infiltrate on CT 09/14/2015  ? Meningioma  ([place]) 11/2009  ? right post parietal 31m - stable 11/2009 MRI  ? Nephrolithiasis   ? Nephrolithiasis   ? Neuromuscular disorder ([facility]   ? fibromyalgia  ? 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 Pure hypercholesterolemia 11/14/2019  ? Sjogren's disease ([facility]   ?  ?Allergies  ?Allergen Reactions  ? Lisinopril Cough  ? ?  ? ?Outpatient Medications Prior to Visit  ?Medication Sig Dispense Refill  ? acetaminophen (TYLENOL) 500 MG tablet Take 500 mg by mouth every 6 (six) hours as needed for moderate pain.    ? augmented betamethasone dipropionate (DIPROLENE-AF) 0.05 % cream Apply 1 a small amount to affected area twice a day    ? Calcium Carbonate-Vitamin D 600-200 MG-UNIT TABS Take 1 tablet by mouth daily.    ? Cholecalciferol (VITAMIN D3) 50 MCG (2000 UT) TABS Take 2 tablets by mouth daily.    ? clotrimazole-betamethasone (LOTRISONE) cream Apply topically 2 (two) times daily.    ? Cobalamin Combinations (VITAMIN B12-FOLIC ACID) 5160-109MCG TABS SMARTSIG:1 By Mouth    ? losartan (COZAAR) 50 MG tablet Take 1 tablet (50 mg total) by mouth daily. 90 tablet 1  ? Magnesium 250 MG TABS Take 2 tablets by mouth daily.    ? PFIZER COVID-19 VAC BIVALENT injection     ? simvastatin (ZOCOR) 20 MG tablet Take 20 mg  by mouth at bedtime.     ? Potassium 99 MG TABS Take 2 tablets by mouth daily. (Patient not taking: Reported on 11/07/2021)    ? ?No facility-administered medications prior to visit.  ? ? ?Review of Systems  ?Constitutional:  Negative for chills, diaphoresis, fever, malaise/fatigue and weight loss.  ?HENT:  Negative for congestion.   ?Respiratory:  Positive for cough. Negative for hemoptysis, sputum production, shortness of breath and wheezing.   ?Cardiovascular:  Negative for chest pain, palpitations and leg swelling.  ? ? ?Objective:  ? ?Vitals:  ? 11/07/21 0901  ?Pulse: 79  ?SpO2: 95%  ?Weight: 203 lb 9.6 oz (92.4 kg)  ?Height: '5\' 9"'$  (1.753 m)  ? ?SpO2: 95 % ? ?Physical Exam: ?General:  Well-appearing, no acute distress ?HENT: [place], AT ?Eyes: EOMI, no scleral icterus ?Respiratory: Clear to auscultation bilaterally.  No crackles, wheezing or rales ?Cardiovascular: RRR, -M/R/G, no JVD ?Extremities:-Edema,-tenderness ?Neuro: AAO x4, CNII-XII grossly intact ?Psych: Normal mood, normal affect ? ?Data Reviewed: ? ?Imaging: ?PET/CT 02/06/18 - Negative for hypermetabolic activity. Thin-walled cysts in mid and lower lungs bilaterally. ? ?CT Chest 07/08/20 - Basilar predominance of thin-walled cysts of varying sizes. Some cysts with calcifications. ? ?PET/CT 0/9/47 - Hypermetabolic nodules in left parotid. Similar low-level hypermetabolism in LLL concerning for inflammatory etiology. Stable cystic lung disease ? ?PFT: ?11/01/20 ?FVC 2.23 (67%) FEV1 1.75 (71%) Ratio 78  TLC 109% DLCO 71%. FVC increased by 12% and FEV1 increased by 13% ?Interpretation: Reversible obstructive defect present on spirometry. Increased RV and RV/TLC consistent with air trapping. Mildly reduced gas exchange ? ?Labs: ?10/18/15 ?ANA 1:1280 ?SSA >8 ?SSB>8 ?CCP neg ? ?Echo: ?12/08/19 ?EF 70-75%. Age-related DD. No WMA or valvular abnormalities. ? ?Cardiac cath 12/01/19 - No CAD ? ?Assessment & Plan:  ? 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 Cough resolved after discontinuing ACEI. ? ?Reversible obstructive lung disease ?Cough - resolved ?Asymptomatic ?--If you develop symptoms of persistent shortness of breath, wheezing or cough in the absence of illness, please call our office  ? ?Diffuse cystic lung disease  ?Left lower lobe densities - hypermetabolic on 0/9628 PET suggestive of inflammatory procress ?--ORDER CT chest without contrast in 02/2022 to monitor annually for progression ? ?Health Maintenance ?Immunization History   ?Administered Date(s) Administered  ? Influenza Split 05/15/2012, 04/28/2014, 05/21/2015  ? Influenza Whole 05/21/2007, 05/19/2008, 06/01/2010  ? Influenza, High Dose Seasonal PF 05/09/2021  ? Influenza,inj,Quad PF,6+ Mos 05/01/2016, 05/10/2017, 05/13/2018, 05/07/2019, 05/12/2020  ? Influenza,inj,quad, With Preservative 06/14/2015  ? PFIZER(Purple Top)SARS-COV-2 Vaccination 10/03/2019, 10/25/2019, 06/28/2020, 11/18/2020, 05/05/2021  ? Pension scheme manager 18yr & up 05/05/2021  ? Pneumococcal Conjugate-13 03/01/2015  ? Pneumococcal Polysaccharide-23 08/20/1998, 02/22/1999  ? Td 04/29/2006  ? Zoster Recombinat (Shingrix) 06/14/2020  ? Zoster, Live 06/23/2007  ? Zoster, Unspecified 04/13/2000  ? ?No orders of the defined types were placed in this encounter. ? ?No orders of the defined types were placed in this encounter. ? ?Return in about 4 months (around 03/09/2022). ? ?I have spent a total time of 20-minutes on the day of the appointment reviewing prior documentation, coordinating care and discussing medical diagnosis and plan with the patient/family. Past medical history, allergies, medications were reviewed. Pertinent imaging, labs and tests included in this note have been reviewed and interpreted independently by me. ? ?[place] JRodman Pickle MD ?[facility] ?11/07/2021 9:04 AM  ?Office Number [phone] ? ? ?

## 2021-11-07 NOTE — Patient Instructions (Signed)
?  When our office calls to schedule to CT scan, please schedule before your next appointment ? ?Follow-up with me in July 2023 ?

## 2021-11-10 ENCOUNTER — Ambulatory Visit: Payer: PPO

## 2021-11-10 ENCOUNTER — Other Ambulatory Visit: Payer: Self-pay

## 2021-12-01 ENCOUNTER — Encounter: Payer: Self-pay | Admitting: Family Medicine

## 2021-12-01 ENCOUNTER — Ambulatory Visit (INDEPENDENT_AMBULATORY_CARE_PROVIDER_SITE_OTHER): Payer: PPO | Admitting: Family Medicine

## 2021-12-01 DIAGNOSIS — I10 Essential (primary) hypertension: Secondary | ICD-10-CM | POA: Diagnosis not present

## 2021-12-01 MED ORDER — LOSARTAN POTASSIUM 100 MG PO TABS: 100.0000 mg | ORAL_TABLET | Freq: Every day | ORAL | 3 refills | Status: DC

## 2021-12-01 NOTE — Patient Instructions (Signed)
It was very nice to see you today! ? ?Losartan increase to '100mg'$ -so 2 of your 50's, but I sent in the '100mg'$  ? ? ?PLEASE NOTE: ? ?If you had any lab tests please let us know if you have not heard back within a few days. You may see your results on MyChart before we have a chance to review them but we will give you a call once they are reviewed by [place]. If we ordered any referrals today, please let us know if you have not heard from their office within the next week.  ? ?Please try these tips to maintain a healthy lifestyle: ? ?Eat most of your calories during the day when you are active. Eliminate processed foods including packaged sweets (pies, cakes, cookies), reduce intake of potatoes, white bread, white pasta, and white rice. Look for whole grain options, oat flour or almond flour. ? ?Each meal should contain half fruits/vegetables, one quarter protein, and one quarter carbs (no bigger than a computer mouse). ? ?Cut down on sweet beverages. This includes juice, soda, and sweet tea. Also watch fruit intake, though this is a healthier sweet option, it still contains natural sugar! Limit to 3 servings daily. ? ?Drink at least 1 glass of water with each meal and aim for at least 8 glasses per day ? ?Exercise at least 150 minutes every week.   ?

## 2021-12-01 NOTE — Progress Notes (Signed)
? ?Subjective:  ? ? ? Patient ID: [name], female    DOB: Mar 27, 1941, 81 y.o.   MRN: [id number] ? ?Chief Complaint  ?Patient presents with  ? Hypertension  ?  Pt being seen for hypertension and bp f/u; pt states been feeling bad, dealing with family situation and her bp has been out of control this week. Potassium no longer taking and leg cramps have started to come back; pt showing due for pneumonia vac but taken 13 and 23.  ? ? ?HPI ? HTN-bp's better.  Running 130-160/80's.  not ideal. felt bad yesterday-but nothing specific.  Some better today. Worries all the time. Sleep sporadic.  Cough much better since off Lisinopril.   No K.  Occ leg cramps.  Occ pounding heart-worries all the time.  ?Did Wellness visit w/Tina-but couldn't connect. ? ?Went over to [place].  23yo niece was there(strained relationship).  Safety concerns.  All ok.  ? ?There are no preventive care reminders to display for this patient. ? ?Past Medical History:  ?Diagnosis Date  ? Anxiety   ? Arthritis   ? right thumb CMC arthritis  ? Asthmatic bronchitis 2017  ? Cancer [facility])   ? -NHL R parotid  ? Cataract   ? bilateral small- removed both eyes   ? Colon cancer ([place])   ? stage 1- at appendiceal orifice   ? Complication of anesthesia   ? has dry mouth anyway from shograns-sore throats post op  ? Coronary artery calcification seen on CAT scan 11/14/2019  ? Depression   ? since 1982   ? Diverticulosis   ? Fibromyalgia   ? GERD (gastroesophageal reflux disease)   ? past hx   ? Heart murmur   ? Hyperlipidemia   ? controlled - metabolic syndrome per pt- goes up and down   ? Hypertension   ? Lung infiltrate on CT 09/14/2015  ? Meningioma ([place]) 11/2009  ? right post parietal 71m - stable 11/2009 MRI  ? Nephrolithiasis   ? Nephrolithiasis   ? Neuromuscular disorder ([place]   ? fibromyalgia  ? 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 Pure hypercholesterolemia 11/14/2019  ? Sjogren's disease ([place]   ? ? ?Past Surgical  History:  ?Procedure Laterality Date  ? ABDOMINAL HYSTERECTOMY    ? APPENDECTOMY  2008  ? ARTERY BIOPSY  02/03/2013  ? Procedure: MINOR BIOPSY TEMPORAL ARTERY;  Surgeon: SAscencion Dike MD;  [place]  Service: ENT;;  ? COLONOSCOPY    ? [place] ? LEFT HEART CATH AND CORONARY ANGIOGRAPHY N/A 12/01/2019  ? Procedure: LEFT HEART CATH AND CORONARY ANGIOGRAPHY;  Surgeon: KTroy Sine MD;  [facility];  Service: Cardiovascular;  Laterality: N/A;  ? MASS EXCISION  2008  ? abd-benign  ? PAROTIDECTOMY Right 08/01/2015  ? Procedure: RIGHT PAROTIDECTOMY;  Surgeon: SLeta Baptist MD;  [place]  Service: ENT;  Laterality: Right;  ? PAROTIDECTOMY Left 04/04/2021  ? Procedure: LEFT PAROTIDECTOMY;  Surgeon: TLeta Baptist MD;  [place]  Service: ENT;  Laterality: Left;  ? POLYPECTOMY    ? TONSILLECTOMY    ? VAGINAL HYSTERECTOMY  1989  ? ? ?Outpatient Medications Prior to Visit  ?Medication Sig Dispense Refill  ? acetaminophen (TYLENOL) 500 MG tablet Take 500 mg by mouth every 6 (six) hours as needed for moderate pain.    ? augmented betamethasone dipropionate (DIPROLENE-AF) 0.05 %  cream Apply 1 a small amount to affected area twice a day    ? Calcium Carbonate-Vitamin D 600-200 MG-UNIT TABS Take 1 tablet by mouth daily.    ? Cholecalciferol (VITAMIN D3) 50 MCG (2000 UT) TABS Take 2 tablets by mouth daily.    ? clotrimazole-betamethasone (LOTRISONE) cream Apply topically 2 (two) times daily.    ? Magnesium 250 MG TABS Take 2 tablets by mouth daily.    ? PFIZER COVID-19 VAC BIVALENT injection     ? simvastatin (ZOCOR) 20 MG tablet Take 20 mg by mouth at bedtime.     ? losartan (COZAAR) 50 MG tablet Take 1 tablet (50 mg total) by mouth daily. 90 tablet 1  ? Cobalamin Combinations (VITAMIN B12-FOLIC ACID) 628-366 MCG TABS SMARTSIG:1 By Mouth (Patient not taking: Reported on 12/01/2021)    ? Potassium 99 MG TABS Take 2 tablets by  mouth daily. (Patient not taking: Reported on 12/01/2021)    ? ?No facility-administered medications prior to visit.  ? ? ?Allergies  ?Allergen Reactions  ? Lisinopril Cough  ? ?ROS neg/noncontributory except as noted HPI/below ? ? ?   ?Objective:  ?  ? ?BP (!) 150/76 (BP Location: Left Arm)   Pulse 82   Temp 98 ?F (36.7 ?C) (Temporal)   Ht '5\' 9"'$  (1.753 m)   Wt 202 lb 12.8 oz (92 kg)   SpO2 95%   BMI 29.95 kg/m?  ?Wt Readings from Last 3 Encounters:  ?12/01/21 202 lb 12.8 oz (92 kg)  ?11/07/21 203 lb 9.6 oz (92.4 kg)  ?10/31/21 203 lb 2 oz (92.1 kg)  ? ? ?Physical Exam  ? ?Gen: WDWN NAD ?HEENT: NCAT, conjunctiva not injected, sclera nonicteric ?NECK:  supple, no thyromegaly, no nodes, no carotid bruits ?CARDIAC: RRR, S1S2+, 1/6 murmur. DP 2+B ?LUNGS: CTAB. No wheezes ?EXT:  no edema ?MSK: no gross abnormalities.  ?NEURO: A&O x3.  CN II-XII intact.  ?PSYCH: normal mood. Good eye contact ? ?   ?Assessment & Plan:  ? ?Problem List Items Addressed This Visit   ? ?  ? Cardiovascular and Mediastinum  ? Essential hypertension  ? Relevant Medications  ? losartan (COZAAR) 100 MG tablet  ? HTN-chronic.  Not controlled.  Inc to '100mg'$ .  Monitor.  Work on [employer] ? ? ?Meds ordered this encounter  ?Medications  ? losartan (COZAAR) 100 MG tablet  ?  Sig: Take 1 tablet (100 mg total) by mouth daily.  ?  Dispense:  90 tablet  ?  Refill:  3  ? ? ?Wellington Hampshire, MD ? ?

## 2021-12-13 ENCOUNTER — Telehealth: Payer: Self-pay | Admitting: Family Medicine

## 2021-12-13 NOTE — Telephone Encounter (Signed)
Left message for patient to schedule Annual Wellness Visit.  Please schedule with Nurse Health Advisor Charlott Rakes, RN at [facility].  Please call [phone] ask for Juliann Pulse  ? ?Phone hangs up ?

## 2021-12-27 ENCOUNTER — Ambulatory Visit: Payer: PPO | Admitting: Internal Medicine

## 2022-01-04 ENCOUNTER — Inpatient Hospital Stay: Payer: PPO | Attending: Hematology and Oncology

## 2022-01-04 ENCOUNTER — Other Ambulatory Visit: Payer: Self-pay

## 2022-01-04 ENCOUNTER — Inpatient Hospital Stay: Payer: PPO | Admitting: Hematology and Oncology

## 2022-01-04 VITALS — BP 185/66 | HR 79 | Temp 98.0°F | Resp 18 | Ht 69.0 in | Wt 203.8 lb

## 2022-01-04 DIAGNOSIS — C884 Extranodal marginal zone B-cell lymphoma of mucosa-associated lymphoid tissue [MALT-lymphoma]: Secondary | ICD-10-CM | POA: Diagnosis not present

## 2022-01-04 DIAGNOSIS — D539 Nutritional anemia, unspecified: Secondary | ICD-10-CM | POA: Diagnosis not present

## 2022-01-04 DIAGNOSIS — Z85038 Personal history of other malignant neoplasm of large intestine: Secondary | ICD-10-CM

## 2022-01-04 DIAGNOSIS — I209 Angina pectoris, unspecified: Secondary | ICD-10-CM

## 2022-01-04 DIAGNOSIS — C8588 Other specified types of non-Hodgkin lymphoma, lymph nodes of multiple sites: Secondary | ICD-10-CM

## 2022-01-04 DIAGNOSIS — I10 Essential (primary) hypertension: Secondary | ICD-10-CM

## 2022-01-04 DIAGNOSIS — Z79899 Other long term (current) drug therapy: Secondary | ICD-10-CM | POA: Diagnosis not present

## 2022-01-04 LAB — CBC WITH DIFFERENTIAL/PLATELET
Abs Immature Granulocytes: 0.03 10*3/uL (ref 0.00–0.07)
Basophils Absolute: 0 10*3/uL (ref 0.0–0.1)
Basophils Relative: 1 %
Eosinophils Absolute: 0.3 10*3/uL (ref 0.0–0.5)
Eosinophils Relative: 4 %
HCT: 35.1 % — ABNORMAL LOW (ref 36.0–46.0)
Hemoglobin: 11.8 g/dL — ABNORMAL LOW (ref 12.0–15.0)
Immature Granulocytes: 0 %
Lymphocytes Relative: 13 %
Lymphs Abs: 0.9 10*3/uL (ref 0.7–4.0)
MCH: 29.9 pg (ref 26.0–34.0)
MCHC: 33.6 g/dL (ref 30.0–36.0)
MCV: 89.1 fL (ref 80.0–100.0)
Monocytes Absolute: 0.7 10*3/uL (ref 0.1–1.0)
Monocytes Relative: 10 %
Neutro Abs: 5.3 10*3/uL (ref 1.7–7.7)
Neutrophils Relative %: 72 %
Platelets: 214 10*3/uL (ref 150–400)
RBC: 3.94 MIL/uL (ref 3.87–5.11)
RDW: 12.7 % (ref 11.5–15.5)
WBC: 7.3 10*3/uL (ref 4.0–10.5)
nRBC: 0 % (ref 0.0–0.2)

## 2022-01-05 ENCOUNTER — Encounter: Payer: Self-pay | Admitting: Hematology and Oncology

## 2022-01-05 DIAGNOSIS — D539 Nutritional anemia, unspecified: Secondary | ICD-10-CM | POA: Insufficient documentation

## 2022-01-05 NOTE — Assessment & Plan Note (Signed)
She has poorly controlled hypertension This is despite being prescribed losartan by her primary care doctor The patient requested follow-up with cardiologist She had an established cardiologist but she does not want to see her cardiologist and request another one due to history of unpleasant experience with the visit I will put in a new cardiology consult per patient request

## 2022-01-05 NOTE — Progress Notes (Signed)
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 [facility]) Clinically, I could not detect any signs or lymphadenopathy suggest cancer recurrence However, given her anemia, nonspecific flank pain and weight loss, I suggest imaging study The patient is somewhat reluctant to pursue this She is willing to give it a few more months of observation to see if she would improve without additional intervention I plan to see her again in 3 months for further follow-up  History of colon cancer, stage I She had remote history of colon cancer With recent onset of anemia, I will order iron studies and tumor marker  Deficiency anemia She had new onset of anemia I will order additional work-up for anemia  Essential hypertension She has poorly controlled hypertension This is despite being prescribed losartan by her primary care doctor The patient requested follow-up with cardiologist She had an established cardiologist but she does not want to see her cardiologist and request another one due to history of unpleasant experience with the visit I will put in a new cardiology consult per patient request  Orders Placed This Encounter  Procedures   CMP ([place] only)    Standing Status:   Future    Standing Expiration Date:   01/06/2023   CBC with Differential (Cancer Center Only)    Standing Status:   Future    Standing Expiration Date:   01/06/2023   Iron and Iron Binding Capacity ([facility] only)    Standing Status:   Future    Standing Expiration Date:   01/06/2023   Ferritin    Standing Status:   Future    Standing Expiration Date:   01/05/2023   Lactate  dehydrogenase    Standing Status:   Future    Standing Expiration Date:   01/06/2023   Vitamin B12    Standing Status:   Future    Standing Expiration Date:   01/06/2023   CEA (IN HOUSE-[facility])    Standing Status:   Future    Standing Expiration Date:   01/06/2023   Ambulatory referral to Cardiology    Referral Priority:   Routine    Referral Type:   Consultation    Referral Reason:   Specialty Services Required    Referred to Provider:   Troy Sine, MD    Requested Specialty:   Cardiology    Number of Visits Requested:   1    All questions were answered. The patient knows to call the clinic with any problems, questions or concerns. The total time spent in the appointment was 30 minutes encounter with patients including review of chart and various tests results, discussions about plan of care and coordination of care plan   Jillian Lark, MD 01/05/2022 9:10 AM  INTERVAL HISTORY: Please see below for problem oriented charting. she returns for surveillance follow-up for history of recurrent lymphoma She also have remote history of colon cancer Last year, she presented with significant unexplained hematuria and back pain She went to the emergency department but after waiting 6 hours, she left without being evaluated Her hematuria resolved She is noted to have uncontrolled hypertension She denies recent chest pain or dizziness Of note, she had heart catheterization several years  ago The patient continues to share with me her unpleasant experience with multiple providers The patient insisted that she should only be seen by MD and not advanced practitioners From the lymphoma perspective, she denies new lymphadenopathy She has some mild weight loss recently  REVIEW OF SYSTEMS:   Constitutional: Denies fevers, chills  Eyes: Denies blurriness of vision Ears, nose, mouth, throat, and face: Denies mucositis or sore throat Respiratory: Denies cough, dyspnea or wheezes Cardiovascular:  Denies palpitation, chest discomfort or lower extremity swelling Gastrointestinal:  Denies nausea, heartburn or change in bowel habits Skin: Denies abnormal skin rashes Lymphatics: Denies new lymphadenopathy or easy bruising Neurological:Denies numbness, tingling or new weaknesses Behavioral/Psych: Mood is stable, no new changes  All other systems were reviewed with the patient and are negative.  I have reviewed the past medical history, past surgical history, social history and family history with the patient and they are unchanged from previous note.  ALLERGIES:  is allergic to lisinopril.  MEDICATIONS:  Current Outpatient Medications  Medication Sig Dispense Refill   acetaminophen (TYLENOL) 500 MG tablet Take 500 mg by mouth every 6 (six) hours as needed for moderate pain.     augmented betamethasone dipropionate (DIPROLENE-AF) 0.05 % cream Apply 1 a small amount to affected area twice a day     Calcium Carbonate-Vitamin D 600-200 MG-UNIT TABS Take 1 tablet by mouth daily.     Cholecalciferol (VITAMIN D3) 50 MCG (2000 UT) TABS Take 2 tablets by mouth daily.     clotrimazole-betamethasone (LOTRISONE) cream Apply topically 2 (two) times daily.     losartan (COZAAR) 100 MG tablet Take 1 tablet (100 mg total) by mouth daily. 90 tablet 3   Magnesium 250 MG TABS Take 2 tablets by mouth daily.     PFIZER COVID-19 VAC BIVALENT injection      simvastatin (ZOCOR) 20 MG tablet Take 20 mg by mouth at bedtime.      No current facility-administered medications for this visit.    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 Please refer to report for other findings     11/10/2015 Procedure   she underwent CT guided biopsy    11/10/2015 Pathology Results   Accession: [id number] right lung CT-guided biopsy suspicious for lymphoma    12/02/2015 - 12/23/2015 Chemotherapy   She received 4 doses of weekly rituximab.    02/10/2016 PET scan   There is no evidence for residual or recurrent hypermetabolic adenopathy    3/79/0240 Imaging   The masslike area of concern in the medial right lung base seen on the previous study is unchanged in size since March 2017. A biopsy was previously recommended after the CT scan from October 26, 2015. Please see that report for further recommendations as the finding was better characterized at that time. 2. No pulmonary emboli. 3. Multiple thin walled cysts in the mid and lower lungs is stable. 4. 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    02/06/2017 PET scan   No evidence of active lymphomatous involvement. Stable 1.5 cm mixed cystic/ solid nodule in the right lower lobe, non FDG avid. Continued attention on follow-up is suggested.  02/13/2017 Procedure   Colonoscopy - One 8 mm polyp in the cecum, removed with a cold snare. Resected and retrieved. - Two 3 to 4 mm polyps in the transverse colon, removed with a cold biopsy forceps. Resected and retrieved. - One 5 mm polyp in the descending colon, removed with a cold snare. Resected and retrieved. - Two 3 to 5 mm polyps at the recto-sigmoid colon, removed with a cold snare. Resected and retrieved. - Mild diverticulosis in the sigmoid colon. - Internal hemorrhoids.       02/06/2018 PET scan   Negative PET-CT. No findings for enlarged or hypermetabolic lymph nodes to suggest lymphoma.    02/24/2021 PET scan   1. Hypermetabolic nodules are nodes centered about the left parotid gland, most likely indicative of recurrent lymphoma-given clinical history. 2. No evidence of hypermetabolic adenopathy outside of the neck.  3. Similar low-level hypermetabolism corresponding  to an area of vague left lower lobe increased density. 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 4. Incidental findings, including: Coronary artery atherosclerosis. Aortic Atherosclerosis (ICD10-I70.0). Tiny hiatal hernia.     04/04/2021 Surgery   PREOPERATIVE DIAGNOSES: 1. Left parotid mass   POSTOPERATIVE DIAGNOSES: 1. Left parotid mass   PROCEDURE PERFORMED: Left lateral parotidectomy with facial nerve dissection     04/04/2021 Pathology Results   A. PAROTID GLAND, LEFT, PAROTIDECTOMY:  -  Lymphoepithelial sialadenitis  -  See comment   COMMENT:   The excision specimen consists of tissue submitted as left parotid.  It is composed of nodular lymphoepithelial lesions and interspersed fibrosis.  Cytokeratin AE1/3 and p40 highlight the epithelial component within the nodular lymphoid proliferations.  The lymphocytes are composed of an admixture of B and T cells, by CD20 and CD3, respectively.  The B cells do not aberrantly express CD5, CD23, CD43 or cyclin D1.  Scattered germinal centers with polarization are highlighted by CD10, BCL6, Bcl-2 (negative) and Ki-67.  Plasma cells are polytypic (CD138, kappa and lambda by in situ hybridization).  Flow cytometry performed on the sample (see [id number]), did not identify a monoclonal B or phenotypically aberrant T-cell population.  Overall, there is no definitive evidence of a lymphoproliferative process in the examined material.      PHYSICAL EXAMINATION: ECOG PERFORMANCE STATUS: 1 - Symptomatic but completely ambulatory  Vitals:   01/04/22 1115  BP: (!) 185/66  Pulse: 79  Resp: 18  Temp: 98 F (36.7 C)  SpO2: 97%   Filed Weights   01/04/22 1115  Weight: 203 lb 12.8 oz (92.4 kg)    GENERAL:alert, no distress and comfortable SKIN: skin color, texture, turgor are normal, no rashes or significant lesions EYES: normal, Conjunctiva are pink and non-injected, sclera clear OROPHARYNX:no exudate, no  erythema and lips, buccal mucosa, and tongue normal  NECK: supple, thyroid normal size, non-tender, without nodularity LYMPH:  no palpable lymphadenopathy in the cervical, axillary or inguinal LUNGS: clear to auscultation and percussion with normal breathing effort HEART: regular rate & rhythm and no murmurs and no lower extremity edema ABDOMEN:abdomen soft, non-tender and normal bowel sounds Musculoskeletal:no cyanosis of digits and no clubbing  NEURO: alert & oriented x 3 with fluent speech, no focal motor/sensory deficits  LABORATORY DATA:  I have reviewed the data as listed    Component Value Date/Time   NA 136 10/31/2021 0938   NA 143 11/25/2019 0842   NA 143 02/06/2017 1302   K 5.3 (H) 10/31/2021 0938   K 4.8 02/06/2017 1302   CL 100  10/31/2021 0938   CO2 30 10/31/2021 0938   CO2 26 02/06/2017 1302   GLUCOSE 101 (H) 10/31/2021 0938   GLUCOSE 100 02/06/2017 1302   BUN 13 10/31/2021 0938   BUN 14 11/25/2019 0842   BUN 14.2 02/06/2017 1302   CREATININE 0.65 10/31/2021 0938   CREATININE 0.8 02/06/2017 1302   CALCIUM 10.3 10/31/2021 0938   CALCIUM 9.7 02/06/2017 1302   PROT 7.0 08/24/2021 0841   PROT 7.2 01/28/2020 0911   PROT 7.8 02/06/2017 1302   ALBUMIN 4.0 08/24/2021 0841   ALBUMIN 4.5 01/28/2020 0911   ALBUMIN 3.9 02/06/2017 1302   AST 13 08/24/2021 0841   AST 20 02/06/2017 1302   ALT 12 08/24/2021 0841   ALT 25 02/06/2017 1302   ALKPHOS 95 08/24/2021 0841   ALKPHOS 105 02/06/2017 1302   BILITOT 0.5 08/24/2021 0841   BILITOT 0.3 01/28/2020 0911   BILITOT 0.41 02/06/2017 1302   GFRNONAA >60 05/28/2020 0904   GFRAA >60 02/09/2020 0818    No results found for: SPEP, UPEP  Lab Results  Component Value Date   WBC 7.3 01/04/2022   NEUTROABS 5.3 01/04/2022   HGB 11.8 (L) 01/04/2022   HCT 35.1 (L) 01/04/2022   MCV 89.1 01/04/2022   PLT 214 01/04/2022      Chemistry      Component Value Date/Time   NA 136 10/31/2021 0938   NA 143 11/25/2019 0842   NA 143  02/06/2017 1302   K 5.3 (H) 10/31/2021 0938   K 4.8 02/06/2017 1302   CL 100 10/31/2021 0938   CO2 30 10/31/2021 0938   CO2 26 02/06/2017 1302   BUN 13 10/31/2021 0938   BUN 14 11/25/2019 0842   BUN 14.2 02/06/2017 1302   CREATININE 0.65 10/31/2021 0938   CREATININE 0.8 02/06/2017 1302      Component Value Date/Time   CALCIUM 10.3 10/31/2021 0938   CALCIUM 9.7 02/06/2017 1302   ALKPHOS 95 08/24/2021 0841   ALKPHOS 105 02/06/2017 1302   AST 13 08/24/2021 0841   AST 20 02/06/2017 1302   ALT 12 08/24/2021 0841   ALT 25 02/06/2017 1302   BILITOT 0.5 08/24/2021 0841   BILITOT 0.3 01/28/2020 0911   BILITOT 0.41 02/06/2017 1302

## 2022-01-05 NOTE — Assessment & Plan Note (Signed)
She had new onset of anemia I will order additional work-up for anemia

## 2022-01-05 NOTE — Assessment & Plan Note (Signed)
She had remote history of colon cancer With recent onset of anemia, I will order iron studies and tumor marker

## 2022-01-05 NOTE — Assessment & Plan Note (Signed)
Clinically, I could not detect any signs or lymphadenopathy suggest cancer recurrence However, given her anemia, nonspecific flank pain and weight loss, I suggest imaging study The patient is somewhat reluctant to pursue this She is willing to give it a few more months of observation to see if she would improve without additional intervention I plan to see her again in 3 months for further follow-up

## 2022-01-08 ENCOUNTER — Other Ambulatory Visit: Payer: Self-pay | Admitting: Family Medicine

## 2022-01-10 ENCOUNTER — Telehealth: Payer: Self-pay

## 2022-01-10 NOTE — Telephone Encounter (Signed)
Returned her call. She is requesting AVS mailed to her from the 5/18 visit. Will print and get in the mail to her today. Verified address. She appreciated the call.

## 2022-02-12 ENCOUNTER — Other Ambulatory Visit: Payer: PPO

## 2022-02-12 ENCOUNTER — Ambulatory Visit: Payer: PPO | Admitting: Hematology and Oncology

## 2022-02-26 ENCOUNTER — Ambulatory Visit: Payer: PPO | Admitting: Family Medicine

## 2022-03-01 ENCOUNTER — Encounter (HOSPITAL_BASED_OUTPATIENT_CLINIC_OR_DEPARTMENT_OTHER): Payer: Self-pay | Admitting: Cardiovascular Disease

## 2022-03-01 ENCOUNTER — Ambulatory Visit (HOSPITAL_BASED_OUTPATIENT_CLINIC_OR_DEPARTMENT_OTHER): Payer: PPO | Admitting: Cardiovascular Disease

## 2022-03-01 DIAGNOSIS — I10 Essential (primary) hypertension: Secondary | ICD-10-CM | POA: Diagnosis not present

## 2022-03-01 DIAGNOSIS — I2510 Atherosclerotic heart disease of native coronary artery without angina pectoris: Secondary | ICD-10-CM | POA: Diagnosis not present

## 2022-03-01 DIAGNOSIS — I2584 Coronary atherosclerosis due to calcified coronary lesion: Secondary | ICD-10-CM

## 2022-03-01 MED ORDER — HYDRALAZINE HCL 50 MG PO TABS: 50.0000 mg | ORAL_TABLET | Freq: Two times a day (BID) | ORAL | 1 refills | Status: DC

## 2022-03-01 MED ORDER — LOSARTAN POTASSIUM 50 MG PO TABS: 50.0000 mg | ORAL_TABLET | Freq: Every day | ORAL | 3 refills | Status: DC

## 2022-03-01 NOTE — Progress Notes (Signed)
Cardiology Office Note  Date:  03/01/2022   ID:  [name], DOB 03-30-1941, MRN [id number]  PCP:  Tawnya Crook, MD  Cardiologist:   [name], MD   No chief complaint on file.    History of Present Illness: [name] is a 80 y.o. female with coronary calcification, B-cell lymphoma, Sjogren's syndrome, meningioma, hypertension, hyperlipidemia, and GERD who was previously seen for the evaluation of chest pain.  She presents here today for follow up. [name] notes that she has been under tremendous stress since 1982.  She subsequently developed Sjogren's syndrome. In the last seven years she has been feeling more stressed.  In the past walking seemed to help but she hasn't been doing that lately.  She has a niece who is mentally challend and causing her a lot of stress.  She has been able to lose weight in the last few weeks by limiting carbohydrates and eating less.  She hasn't been exercising.  In the last few years she has experienced a throbbing sensation in her chest.  It occurs intermittently and of went when she is reading.  It startles her and lasts for approximately 20 minutes.  There is no associated shortness of breath.  She does have some exertional dyspnea and felt difficulty walking into the [facility].  She denies nausea or diaphoresis.  She hasn't been getting any exercise lately.    At last appointment she reported chest pain that was concerning for angina.  She underwent cardiac catheterization and was found to only have minimal disease (10%).  She also had a slight murmur and was referred for an echocardiogram which revealed LVEF 70 to 75% with grade 1 diastolic dysfunction.  She had aortic valve sclerosis but no stenosis.  At last appointment she was started on the amlodipine.  She felt poorly on amlodipine and was subsequently switched to losartan.  Today, she reports that her blood pressure has been elevated, with systolic pressures as high as  160 mmHg. She is unable to take the amlodipine due to the unfavorable symptoms, and has not been taking her blood pressure medication until recently. Reports having chest pain over the last year, and describes it as "strong pains" in the center of her chest. Of note, This morning, she felt pain and discomfort in her right lower extremity. She used to exercise 45 minutes a day, but has not been exercising as much recently due to sleeping only 2-3 hours each night.   Of note, she has syndrome, fibromyalgia and and her mentally ill niece recently moved in with her sister which has been worsening her stress. Her son committed suicide in 70 after being molested by his father, and her sister's husband passed away three years ago. [place] but her niece has moved in with her sister which has made her sister distraught.   The patient denies shortness of breath, nocturnal dyspnea, orthopnea or peripheral edema.  There have been no palpitations, lightheadedness or syncope.     Past Medical History:  Diagnosis Date   Anxiety    Arthritis    right thumb [facility] arthritis   Asthmatic bronchitis 2017   Cancer (HCC)    -NHL R parotid   Cataract    bilateral small- removed both eyes    Colon cancer ([place])    stage 1- at appendiceal orifice    Complication of anesthesia    has dry mouth anyway from  shograns-sore throats post op   Coronary artery calcification seen on CAT scan 11/14/2019   Depression    since 1982    Diverticulosis    Fibromyalgia    GERD (gastroesophageal reflux disease)    past hx    Heart murmur    Hyperlipidemia    controlled - metabolic syndrome per pt- goes up and down    Hypertension    Lung infiltrate on CT 09/14/2015   Meningioma ([place]) 11/2009   right post parietal 61m - stable 11/2009 MRI   Nephrolithiasis    Nephrolithiasis    Neuromuscular disorder ([place]    fibromyalgia   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[place]  08/31/2015   Pure hypercholesterolemia 11/14/2019   [place]'s disease ([place]     Past Surgical History:  Procedure Laterality Date   ABDOMINAL HYSTERECTOMY     APPENDECTOMY  2008   ARTERY BIOPSY  02/03/2013   Procedure: MINOR BIOPSY TEMPORAL ARTERY;  Surgeon: SAscencion Dike MD;  [facility]  Service: ENT;;   COLONOSCOPY     [facility]ATH AND CORONARY ANGIOGRAPHY N/A 12/01/2019   Procedure: LEFT HEART CATH AND CORONARY ANGIOGRAPHY;  Surgeon: KTroy Sine MD;  [facility];  Service: Cardiovascular;  Laterality: N/A;   MASS EXCISION  2008   abd-benign   PAROTIDECTOMY Right 08/01/2015   Procedure: RIGHT PAROTIDECTOMY;  Surgeon: SLeta Baptist MD;  [place] [facility]  Service: ENT;  Laterality: Right;   PAROTIDECTOMY Left 04/04/2021   Procedure: LEFT PAROTIDECTOMY;  Surgeon: TLeta Baptist MD;  [facility]  Service: ENT;  Laterality: Left;   POLYPECTOMY     TONSILLECTOMY     VAGINAL HYSTERECTOMY  1989     Current Outpatient Medications  Medication Sig Dispense Refill   acetaminophen (TYLENOL) 500 MG tablet Take 500 mg by mouth every 6 (six) hours as needed for moderate pain.     augmented betamethasone dipropionate (DIPROLENE-AF) 0.05 % cream Apply 1 a small amount to affected area twice a day     Calcium Carbonate-Vitamin D 600-200 MG-UNIT TABS Take 1 tablet by mouth daily.     Cholecalciferol (VITAMIN D3) 50 MCG (2000 UT) TABS Take 2 tablets by mouth daily.     clotrimazole-betamethasone (LOTRISONE) cream Apply topically 2 (two) times daily.     losartan (COZAAR) 100 MG tablet Take 1 tablet (100 mg total) by mouth daily. 90 tablet 3   Magnesium 250 MG TABS Take 2 tablets by mouth daily.     PFIZER COVID-19 VAC BIVALENT injection      simvastatin (ZOCOR) 20 MG tablet Take 20 mg by mouth at bedtime.      No current facility-administered medications for this visit.    Allergies:    Lisinopril    Social History:  The patient  reports that she has never smoked. She has never used smokeless tobacco. She reports current alcohol use. She reports that she does not use drugs.   Family History:  The patient's family history includes Colon cancer in her father; Colon cancer (age of onset: 61 in her mother; Suicidality in her son.    ROS:   Please see the history of present illness. (+) Stress (+) Chest Pain All other systems are reviewed and negative.     PHYSICAL EXAM: VS:  BP (!) 174/72 (BP Location: Left Arm, Patient Position: Sitting, Cuff Size: Large)   Pulse  84   Ht '5\' 9"'$  (1.753 m)   Wt 205 lb 6.4 oz (93.2 kg)   BMI 30.33 kg/m  , BMI Body mass index is 30.33 kg/m. GENERAL:  Well appearing HEENT:  Pupils equal round and reactive, fundi not visualized, oral mucosa unremarkable NECK:  No jugular venous distention, waveform within normal limits, carotid upstroke brisk and symmetric, no bruits LUNGS:  Clear to auscultation bilaterally HEART:  RRR.  PMI not displaced or sustained,S1 and S2 within normal limits, no S3, no S4, no clicks, no rubs, mild heart murmur,  II/VI systolic murmur at the LUSB ABD:  Flat, positive bowel sounds normal in frequency in pitch, no bruits, no rebound, no guarding, no midline pulsatile mass, no hepatomegaly, no splenomegaly EXT:  2 plus pulses throughout, no edema, no cyanosis no clubbing SKIN:  No rashes no nodules NEURO:  Cranial nerves II through XII grossly intact, motor grossly intact throughout PSYCH:  Cognitively intact, oriented to person place and time   EKG:   03/01/2022 EKG: Rate 84. Sinus Rhythm at low voltage.  The ekg ordered 03/09/16 demonstrates sinus rhythm.  Rate 84  Bpm.  Low voltage limb leads. (personally reviewed)   Recent Labs: 08/24/2021: ALT 12; TSH 2.61 10/31/2021: BUN 13; Creatinine, Ser 0.65; Potassium 5.3; Sodium 136 01/04/2022: Hemoglobin 11.8; Platelets 214    Lipid Panel    Component Value  Date/Time   CHOL 208 (H) 08/24/2021 0841   CHOL 195 01/28/2020 0911   TRIG 194.0 (H) 08/24/2021 0841   HDL 54.20 08/24/2021 0841   HDL 54 01/28/2020 0911   CHOLHDL 4 08/24/2021 0841   VLDL 38.8 08/24/2021 0841   LDLCALC 115 (H) 08/24/2021 0841   LDLCALC 117 (H) 01/28/2020 0911   LDLDIRECT 129.5 06/10/2013 0956    05/25/19: WBC 6.7, hgb 12.3, hct 36.2, plt 206 Sodium 143, potassium 4.7, BUN 11, creatinine 0.69 AST 14, ALT 16 Total cholesterol 185, triglycerides 185, HDL 47, LDL 102   Wt Readings from Last 3 Encounters:  03/01/22 205 lb 6.4 oz (93.2 kg)  01/04/22 203 lb 12.8 oz (92.4 kg)  12/01/21 202 lb 12.8 oz (92 kg)     ASSESSMENT AND PLAN:  Coronary artery calcification Mild plaque at cardiac cath 11/2019.    Essential hypertension BP has been very labile ranging from the 120s-160s.  She didn't tolerate amlodipne and has been taking losartan lately.  She doesn't feel well on losartan.  She is under a lot of stress from a family situation.  She has Sjogren's so avoid diuretics.  We will add hydralazine '50mg'$  bid.  Reduce losartan to '50mg'$  daily.  Encouraged her to get help for her stress but she doesn't think anything will help.  She isn't exercising.  Multiple reason why she won't exercise.  She declines all help with her stress.  Time spent: 40 minutes-Greater than 50% of this time was spent in counseling, explanation of diagnosis, planning of further management, and coordination of care.   Current medicines are reviewed at length with the patient today.  The patient does not have concerns regarding medicines.  The following changes have been made:  Start amlodipine and aspirin  Labs/ tests ordered today include:  No orders of the defined types were placed in this encounter.  Marland Kitchentcrt  Disposition:   FU with Jillian Gregg C. Oval Linsey, MD, [facility] in 1 month   [name],[name],acting as a Education administrator for [name], MD.,have documented all relevant documentation on the behalf  of [name], MD,as directed  by  [name], MD while in the presence of [name], MD.   [name], Jillian [place] Oval Linsey, MD have reviewed all documentation for this visit.  The documentation of the exam, diagnosis, procedures, and orders on 03/01/2022 are all accurate and complete.

## 2022-03-01 NOTE — Assessment & Plan Note (Signed)
Mild plaque at cardiac cath 11/2019.

## 2022-03-01 NOTE — Assessment & Plan Note (Addendum)
BP has been very labile ranging from the 120s-160s.  She didn't tolerate amlodipne and has been taking losartan lately.  She doesn't feel well on losartan.  She is under a lot of stress from a family situation.  She has Sjogren's so avoid diuretics.  We will add hydralazine '50mg'$  bid.  Reduce losartan to '50mg'$  daily.  Encouraged her to get help for her stress but she doesn't think anything will help.  She isn't exercising.  Multiple reason why she won't exercise.  She declines all help with her stress.

## 2022-03-01 NOTE — Patient Instructions (Signed)
Medication Instructions:  DECREASE YOUR LOSARTAN TO 50 MG DAILY   START HYDRALAZINE 50 MG TWICE A DAY (12 HOURS APART)  *If you need a refill on your cardiac medications before your next appointment, please call your pharmacy*  Lab Work: NONE   Testing/Procedures: NONE  Follow-Up: At [facility], you and your health needs are our priority.  As part of our continuing mission to provide you with exceptional heart care, we have created designated Provider Care Teams.  These Care Teams include your primary Cardiologist (physician) and Advanced Practice Providers (APPs -  Physician Assistants and Nurse Practitioners) who all work together to provide you with the care you need, when you need it.  We recommend signing up for the patient portal called "MyChart".  Sign up information is provided on this After Visit Summary.  MyChart is used to connect with patients for Virtual Visits (Telemedicine).  Patients are able to view lab/test results, encounter notes, upcoming appointments, etc.  Non-urgent messages can be sent to your provider as well.   To learn more about what you can do with MyChart, go to [email or web address].    Your next appointment:   6 month(s)  The format for your next appointment:   In Person  Provider:   [name], MD or [name], NP  {  1 MONTH WITH PHARM D

## 2022-03-05 ENCOUNTER — Ambulatory Visit (HOSPITAL_COMMUNITY)
Admission: RE | Admit: 2022-03-05 | Discharge: 2022-03-05 | Disposition: A | Discharge status: Home / Self Care | Payer: PPO | Source: Ambulatory Visit | Attending: Family Medicine | Admitting: Family Medicine

## 2022-03-05 ENCOUNTER — Encounter (HOSPITAL_COMMUNITY): Payer: Self-pay

## 2022-03-05 ENCOUNTER — Ambulatory Visit: Payer: PPO | Admitting: Family Medicine

## 2022-03-05 DIAGNOSIS — I700 Atherosclerosis of aorta: Secondary | ICD-10-CM | POA: Insufficient documentation

## 2022-03-05 DIAGNOSIS — K449 Diaphragmatic hernia without obstruction or gangrene: Secondary | ICD-10-CM | POA: Insufficient documentation

## 2022-03-05 DIAGNOSIS — J984 Other disorders of lung: Secondary | ICD-10-CM | POA: Insufficient documentation

## 2022-03-05 DIAGNOSIS — Z8572 Personal history of non-Hodgkin lymphomas: Secondary | ICD-10-CM | POA: Diagnosis not present

## 2022-03-06 DIAGNOSIS — Z1231 Encounter for screening mammogram for malignant neoplasm of breast: Secondary | ICD-10-CM | POA: Diagnosis not present

## 2022-03-06 LAB — HM MAMMOGRAPHY

## 2022-03-08 ENCOUNTER — Encounter: Payer: Self-pay | Admitting: Family Medicine

## 2022-03-12 ENCOUNTER — Telehealth: Payer: Self-pay | Admitting: Family Medicine

## 2022-03-12 ENCOUNTER — Telehealth: Payer: Self-pay | Admitting: Cardiovascular Disease

## 2022-03-12 NOTE — Telephone Encounter (Signed)
Please advise 

## 2022-03-12 NOTE — Telephone Encounter (Signed)
Pt c/o medication issue:  1. Name of Medication: hydrALAZINE (APRESOLINE) 50 MG tablet  2. How are you currently taking this medication (dosage and times per day)?  Take 1 tablet (50 mg total) by mouth in the morning and at bedtime. 12 HOURS APART  3. Are you having a reaction (difficulty breathing--STAT)? no  4. What is your medication issue? Patient states when she took the medication for 8 days it made her heart pound for about an 1 1/2 to 2 hours each time she took it.  She stopped taking the medication on Friday evening.  Since then she has felt better. She said she had called over the weekend about it but no one every called her back.

## 2022-03-12 NOTE — Telephone Encounter (Signed)
Copied from Page [phone]. Topic: Medicare AWV >> Mar 12, 2022 10:14 AM Devoria Glassing wrote: Reason for CRM: Left message for patient to schedule Annual Wellness Visit.  Please schedule with Nurse Health Advisor Charlott Rakes, RN at [facility]. This appt can be telephone or office visit. Please call [phone] ask for [facility]

## 2022-03-13 ENCOUNTER — Encounter: Payer: Self-pay | Admitting: Family Medicine

## 2022-03-13 NOTE — Telephone Encounter (Signed)
I called patient back.  Advised of message from [facility]- patient states she does not understand what difference this would be, but I did advise the dosage was different and it may not give her the effects. I did advise her to keep the upcoming appointment.   Patient then states she called on Friday during this episode and she was advised she would receive a call back, but never did. She was very upset that nobody called her- I did advise with patient that while this was an after hours number this was not for emergency situations, and if she felt she was scared or concerned she should call 911. Patient verbalized understanding, however wanted Dr.Finney to be aware of the after hours number not calling her back. Advised I would notify them.   Thanks!

## 2022-03-13 NOTE — Telephone Encounter (Signed)
Would try decreasing the dose to 1/2 tablet ('25mg'$ ) twice a day and keep her follow up with the pharmacist

## 2022-03-15 ENCOUNTER — Ambulatory Visit (INDEPENDENT_AMBULATORY_CARE_PROVIDER_SITE_OTHER): Payer: PPO

## 2022-03-15 DIAGNOSIS — Z0000 Encounter for general adult medical examination without abnormal findings: Secondary | ICD-10-CM

## 2022-03-15 NOTE — Patient Instructions (Signed)
[name] , Thank you for taking time to come for your Medicare Wellness Visit. I appreciate your ongoing commitment to your health goals. Please review the following plan we discussed and let me know if I can assist you in the future.   Screening recommendations/referrals: Colonoscopy: done 03/04/20 repeat every 3 years  Mammogram: done 03/06/22 repeat every year  Bone Density: done 10/10/20 Recommended yearly ophthalmology/optometry visit for glaucoma screening and checkup Recommended yearly dental visit for hygiene and checkup  Vaccinations: Influenza vaccine: 05/09/21 repeat every year  Pneumococcal vaccine: Up to date Tdap vaccine: due  Shingles vaccine: completed 06/14/20,  Covid-19:completed 2/13, 3/7, 06/28/20 & 4/1, 05/05/20   Advanced directives: Advance directive discussed with you today. Even though you declined this today please call our office should you change your mind and we can give you the proper paperwork for you to fill out.  Conditions/risks identified: none at this time   Next appointment: Follow up in one year for your annual wellness visit    Preventive Care 65 Years and Older, Female Preventive care refers to lifestyle choices and visits with your health care provider that can promote health and wellness. What does preventive care include? A yearly physical exam. This is also called an annual well check. Dental exams once or twice a year. Routine eye exams. Ask your health care provider how often you should have your eyes checked. Personal lifestyle choices, including: Daily care of your teeth and gums. Regular physical activity. Eating a healthy diet. Avoiding tobacco and drug use. Limiting alcohol use. Practicing safe sex. Taking low-dose aspirin every day. Taking vitamin and mineral supplements as recommended by your health care provider. What happens during an annual well check? The services and screenings done by your health care provider during your  annual well check will depend on your age, overall health, lifestyle risk factors, and family history of disease. Counseling  Your health care provider may ask you questions about your: Alcohol use. Tobacco use. Drug use. Emotional well-being. Home and relationship well-being. Sexual activity. Eating habits. History of falls. Memory and ability to understand (cognition). Work and work Statistician. Reproductive health. Screening  You may have the following tests or measurements: Height, weight, and BMI. Blood pressure. Lipid and cholesterol levels. These may be checked every 5 years, or more frequently if you are over 34 years old. Skin check. Lung cancer screening. You may have this screening every year starting at age 31 if you have a 30-pack-year history of smoking and currently smoke or have quit within the past 15 years. Fecal occult blood test (FOBT) of the stool. You may have this test every year starting at age 26. Flexible sigmoidoscopy or colonoscopy. You may have a sigmoidoscopy every 5 years or a colonoscopy every 10 years starting at age 62. Hepatitis C blood test. Hepatitis B blood test. Sexually transmitted disease (STD) testing. Diabetes screening. This is done by checking your blood sugar (glucose) after you have not eaten for a while (fasting). You may have this done every 1-3 years. Bone density scan. This is done to screen for osteoporosis. You may have this done starting at age 62. Mammogram. This may be done every 1-2 years. Talk to your health care provider about how often you should have regular mammograms. Talk with your health care provider about your test results, treatment options, and if necessary, the need for more tests. Vaccines  Your health care provider may recommend certain vaccines, such as: Influenza vaccine. This is recommended  every year. Tetanus, diphtheria, and acellular pertussis (Tdap, Td) vaccine. You may need a Td booster every 10  years. Zoster vaccine. You may need this after age 108. Pneumococcal 13-valent conjugate (PCV13) vaccine. One dose is recommended after age 66. Pneumococcal polysaccharide (PPSV23) vaccine. One dose is recommended after age 40. Talk to your health care provider about which screenings and vaccines you need and how often you need them. This information is not intended to replace advice given to you by your health care provider. Make sure you discuss any questions you have with your health care provider. Document Released: 09/02/2015 Document Revised: 04/25/2016 Document Reviewed: 06/07/2015 Elsevier Interactive Patient Education  2017 [employer] Prevention in the Home Falls can cause injuries. They can happen to people of all ages. There are many things you can do to make your home safe and to help prevent falls. What can I do on the outside of my home? Regularly fix the edges of walkways and driveways and fix any cracks. Remove anything that might make you trip as you walk through a door, such as a raised step or threshold. Trim any bushes or trees on the path to your home. Use bright outdoor lighting. Clear any walking paths of anything that might make someone trip, such as rocks or tools. Regularly check to see if handrails are loose or broken. Make sure that both sides of any steps have handrails. Any raised decks and porches should have guardrails on the edges. Have any leaves, snow, or ice cleared regularly. Use sand or salt on walking paths during winter. Clean up any spills in your garage right away. This includes oil or grease spills. What can I do in the bathroom? Use night lights. Install grab bars by the toilet and in the tub and shower. Do not use towel bars as grab bars. Use non-skid mats or decals in the tub or shower. If you need to sit down in the shower, use a plastic, non-slip stool. Keep the floor dry. Clean up any water that spills on the floor as soon as it  happens. Remove soap buildup in the tub or shower regularly. Attach bath mats securely with double-sided non-slip rug tape. Do not have throw rugs and other things on the floor that can make you trip. What can I do in the bedroom? Use night lights. Make sure that you have a light by your bed that is easy to reach. Do not use any sheets or blankets that are too big for your bed. They should not hang down onto the floor. Have a firm chair that has side arms. You can use this for support while you get dressed. Do not have throw rugs and other things on the floor that can make you trip. What can I do in the kitchen? Clean up any spills right away. Avoid walking on wet floors. Keep items that you use a lot in easy-to-reach places. If you need to reach something above you, use a strong step stool that has a grab bar. Keep electrical cords out of the way. Do not use floor polish or wax that makes floors slippery. If you must use wax, use non-skid floor wax. Do not have throw rugs and other things on the floor that can make you trip. What can I do with my stairs? Do not leave any items on the stairs. Make sure that there are handrails on both sides of the stairs and use them. Fix handrails that are broken  or loose. Make sure that handrails are as long as the stairways. Check any carpeting to make sure that it is firmly attached to the stairs. Fix any carpet that is loose or worn. Avoid having throw rugs at the top or bottom of the stairs. If you do have throw rugs, attach them to the floor with carpet tape. Make sure that you have a light switch at the top of the stairs and the bottom of the stairs. If you do not have them, ask someone to add them for you. What else can I do to help prevent falls? Wear shoes that: Do not have high heels. Have rubber bottoms. Are comfortable and fit you well. Are closed at the toe. Do not wear sandals. If you use a stepladder: Make sure that it is fully opened.  Do not climb a closed stepladder. Make sure that both sides of the stepladder are locked into place. Ask someone to hold it for you, if possible. Clearly mark and make sure that you can see: Any grab bars or handrails. First and last steps. Where the edge of each step is. Use tools that help you move around (mobility aids) if they are needed. These include: Canes. Walkers. Scooters. Crutches. Turn on the lights when you go into a dark area. Replace any light bulbs as soon as they burn out. Set up your furniture so you have a clear path. Avoid moving your furniture around. If any of your floors are uneven, fix them. If there are any pets around you, be aware of where they are. Review your medicines with your doctor. Some medicines can make you feel dizzy. This can increase your chance of falling. Ask your doctor what other things that you can do to help prevent falls. This information is not intended to replace advice given to you by your health care provider. Make sure you discuss any questions you have with your health care provider. Document Released: 06/02/2009 Document Revised: 01/12/2016 Document Reviewed: 09/10/2014 Elsevier Interactive Patient Education  2017 [employer].

## 2022-03-15 NOTE — Progress Notes (Signed)
Virtual Visit via Telephone Note  I connected with  [name] on 03/15/22 at  3:15 PM EDT by telephone and verified that I am speaking with the correct person using two identifiers.  Medicare Annual Wellness visit completed telephonically due to Covid-19 pandemic.   Persons participating in this call: This Health Coach and this patient.   Location: Patient: home Provider: office    I discussed the limitations, risks, security and privacy concerns of performing an evaluation and management service by telephone and the availability of in person appointments. The patient expressed understanding and agreed to proceed.  Unable to perform video visit due to video visit attempted and failed and/or patient does not have video capability.   Some vital signs may be absent or patient reported.   Jillian Brace, LPN   Subjective:   [name] is a 81 y.o. female who presents for Medicare Annual (Subsequent) preventive examination.  Review of Systems     Cardiac Risk Factors include: advanced age (>77mn, >>43women);hypertension;dyslipidemia;obesity (BMI >30kg/m2)     Objective:    There were no vitals filed for this visit. There is no height or weight on file to calculate BMI.     03/15/2022    3:25 PM 04/04/2021    8:06 AM 03/27/2021    2:21 PM 12/22/2020    9:40 AM 12/01/2019    9:13 AM 02/19/2017    9:44 AM 01/28/2017    2:41 PM  Advanced Directives  Does Patient Have a Medical Advance Directive? Yes Yes Yes Yes Yes Yes Yes  Type of Advance Directive Living will [place] will [place]Living will HUnionLiving will HCountry Club HillsLiving will Living will;Healthcare Power of [place]Living will  Does patient want to make changes to medical advance directive?   No - Patient declined  No - Patient declined    Copy of [place] Chart?   No - copy  requested No - copy requested No - copy requested No - copy requested     Current Medications (verified) Outpatient Encounter Medications as of 03/15/2022  Medication Sig   acetaminophen (TYLENOL) 500 MG tablet Take 500 mg by mouth every 6 (six) hours as needed for moderate pain.   augmented betamethasone dipropionate (DIPROLENE-AF) 0.05 % cream Apply 1 a small amount to affected area twice a day   Calcium Carbonate-Vitamin D 600-200 MG-UNIT TABS Take 1 tablet by mouth daily.   Cholecalciferol (VITAMIN D3) 50 MCG (2000 UT) TABS Take 2 tablets by mouth daily.   clotrimazole-betamethasone (LOTRISONE) cream Apply topically 2 (two) times daily.   hydrALAZINE (APRESOLINE) 50 MG tablet Take 1 tablet (50 mg total) by mouth in the morning and at bedtime. 12 HOURS APART   losartan (COZAAR) 50 MG tablet Take 1 tablet (50 mg total) by mouth daily.   Magnesium 250 MG TABS Take 2 tablets by mouth daily.   simvastatin (ZOCOR) 20 MG tablet Take 20 mg by mouth at bedtime.    PFIZER COVID-19 VAC BIVALENT injection    PREVNAR 20 0.5 ML injection    SHINGRIX injection    No facility-administered encounter medications on file as of 03/15/2022.    Allergies (verified) Lisinopril   History: Past Medical History:  Diagnosis Date   Anxiety    Arthritis    right thumb CMC arthritis   Asthmatic bronchitis 2017   Cancer (HCC)    -NHL R parotid  Cataract    bilateral small- removed both eyes    Colon cancer ([place])    stage 1- at appendiceal orifice    Complication of anesthesia    has dry mouth anyway from shograns-sore throats post op   Coronary artery calcification seen on CAT scan 11/14/2019   Depression    since 1982    Diverticulosis    Fibromyalgia    GERD (gastroesophageal reflux disease)    past hx    Heart murmur    Hyperlipidemia    controlled - metabolic syndrome per pt- goes up and down    Hypertension    Lung infiltrate on CT 09/14/2015   Meningioma ([place]) 11/2009   right post  parietal 55m - stable 11/2009 MRI   Nephrolithiasis    Nephrolithiasis    Neuromuscular disorder ([place]    fibromyalgia   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   Pure hypercholesterolemia 11/14/2019   Sjogren's disease ([place]    Past Surgical History:  Procedure Laterality Date   ABDOMINAL HYSTERECTOMY     APPENDECTOMY  2008   ARTERY BIOPSY  02/03/2013   Procedure: MINOR BIOPSY TEMPORAL ARTERY;  Surgeon: SAscencion Dike MD;  [facility]  Service: ENT;;   COLONOSCOPY     [place] Point[place]ATH AND CORONARY ANGIOGRAPHY N/A 12/01/2019   Procedure: LEFT HEART CATH AND CORONARY ANGIOGRAPHY;  Surgeon: KTroy Sine MD;  [facility];  Service: Cardiovascular;  Laterality: N/A;   MASS EXCISION  2008   abd-benign   PAROTIDECTOMY Right 08/01/2015   Procedure: RIGHT PAROTIDECTOMY;  Surgeon: SLeta Baptist MD;  [facility]  Service: ENT;  Laterality: Right;   PAROTIDECTOMY Left 04/04/2021   Procedure: LEFT PAROTIDECTOMY;  Surgeon: TLeta Baptist MD;  [facility]  Service: ENT;  Laterality: Left;   POLYPECTOMY     TONSILLECTOMY     VAGINAL HYSTERECTOMY  1989   Family History  Problem Relation Age of Onset   Colon cancer Mother 622  Colon cancer Father        unknown when onset was   Suicidality Son    Diabetes Neg Hx    Stomach cancer Neg Hx    Colon polyps Neg Hx    Esophageal cancer Neg Hx    Rectal cancer Neg Hx    Social History   Socioeconomic History   Marital status: Divorced    Spouse name: Not on file   Number of children: 2   Years of education: College   Highest education level: Not on file  Occupational History   Occupation: Retired    EFish farm manager UNEMPLOYED  Tobacco Use   Smoking status: Never   Smokeless tobacco: Never   Tobacco comments:    Passive exposure through her mother.  Vaping Use   Vaping Use: Never used  Substance and Sexual  Activity   Alcohol use: Yes    Alcohol/week: 0.0 standard drinks of alcohol    Comment: Rarely   Drug use: No   Sexual activity: Not Currently    Comment: M[employer]terson [employer].  Other Topics Concern   Not on file  Social History Narrative   Pt lives at home alone.   Caffeine Use: 2 mugs every other day.       [facility]:   [place]ationally she  has been to [place], [place], [place], & [place]. Previously has worked in Press photographer. No chemical or fume exposures. No pets currently. No bird, mold, or hot tub exposure. 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    Social Determinants of Health   Financial Resource Strain: Low Risk  (03/15/2022)   Overall Financial Resource Strain (CARDIA)    Difficulty of Paying Living Expenses: Not hard at all  Food Insecurity: No Food Insecurity (03/15/2022)   Hunger Vital Sign    Worried About Running Out of Food in the Last Year: Never true    Ran Out of Food in the Last Year: Never true  Transportation Needs: No Transportation Needs (03/15/2022)   PRAPARE - Hydrologist (Medical): No    Lack of Transportation (Non-Medical): No  Physical Activity: Inactive (03/15/2022)   Exercise Vital Sign    Days of Exercise per Week: 0 days    Minutes of Exercise per Session: 0 min  Stress: Stress Concern Present (03/15/2022)   Stem    Feeling of Stress : To some extent  Social Connections: Socially Isolated (03/15/2022)   Social Connection and Isolation Panel [NHANES]    Frequency of Communication with Friends and Family: More than three times a week    Frequency of Social Gatherings with Friends and Family: More than three times a week    Attends Religious Services: Never    Marine scientist or Organizations: No    Attends  Archivist Meetings: Never    Marital Status: Divorced    Tobacco Counseling Counseling given: Not Answered Tobacco comments: Passive exposure through her mother.   Clinical Intake:  Pre-visit preparation completed: Yes  Pain : No/denies pain     Diabetes: No  How often do you need to have someone help you when you read instructions, pamphlets, or other written materials from your doctor or pharmacy?: 1 - Never  Diabetic?no  Interpreter Needed?: No  Information entered by :: Charlott Rakes, LPN   Activities of Daily Living    03/15/2022    3:26 PM 04/04/2021    8:15 AM  In your present state of health, do you have any difficulty performing the following activities:  Hearing? 0 0  Vision? 0 0  Difficulty concentrating or making decisions? 0 0  Walking or climbing stairs? 0 0  Dressing or bathing? 0 0  Doing errands, shopping? 0   Preparing Food and eating ? N   Using the Toilet? N   In the past six months, have you accidently leaked urine? N   Do you have problems with loss of bowel control? N   Managing your Medications? N   Managing your Finances? N   Housekeeping or managing your Housekeeping? N     Patient Care Team: Tawnya Crook, MD as PCP - General (Family Medicine) [name], MD as Consulting Physician (Otolaryngology) Sable Feil, MD as Consulting Physician (Gastroenterology) Heath Lark, MD as Consulting Physician (Hematology and Oncology)  Indicate any recent Medical Services you may have received from other than [facility] providers in the past year (date may be approximate).     Assessment:   This is a routine wellness examination for [facility].  Hearing/Vision screen Hearing Screening - Comments:: Pt denies any hearing issues  Vision Screening - Comments:: Pt follows up with Dr Ellie Lunch for annual eye exams  Dietary issues and exercise activities discussed: Current Exercise Habits: The patient does not participate in regular  exercise at present   Goals Addressed             This Visit's Progress    Patient Stated       None at this time        Depression Screen    03/15/2022    3:23 PM 08/23/2021    9:22 AM 05/14/2013    2:55 PM  PHQ 2/9 Scores  PHQ - 2 Score 2 4 0  PHQ- 9 Score 8 10     Fall Risk    03/15/2022    3:26 PM 08/23/2021    9:13 AM 05/14/2013    2:55 PM  Fall Risk   Falls in the past year? 0 0 No  Number falls in past yr: 0 0   Injury with Fall? 0 0   Risk for fall due to : Impaired vision    Follow up Falls prevention discussed      FALL RISK PREVENTION PERTAINING TO THE HOME:  Any stairs in or around the home? No  If so, are there any without handrails? No  Home free of loose throw rugs in walkways, pet beds, electrical cords, etc? Yes  Adequate lighting in your home to reduce risk of falls? Yes   ASSISTIVE DEVICES UTILIZED TO PREVENT FALLS:  Life alert? No  Use of a cane, walker or w/c? No  Grab bars in the bathroom? No  Shower chair or bench in shower? No  Elevated toilet seat or a handicapped toilet? No   TIMED UP AND GO:  Was the test performed? No .  Cognitive Function:        03/15/2022    3:27 PM  6CIT Screen  What Year? 0 points  What month? 0 points  What time? 0 points  Count back from 20 0 points  Months in reverse 0 points  Repeat phrase 0 points  Total Score 0 points    Immunizations Immunization History  Administered Date(s) Administered   Influenza Split 05/15/2012, 04/28/2014, 05/21/2015   Influenza Whole 05/21/2007, 05/19/2008, 06/01/2010   Influenza, High Dose Seasonal PF 05/09/2021   Influenza,inj,Quad PF,6+ Mos 05/01/2016, 05/10/2017, 05/13/2018, 05/07/2019, 05/12/2020   Influenza,inj,quad, With Preservative 06/14/2015   PFIZER(Purple Top)SARS-COV-2 Vaccination 10/03/2019, 10/25/2019, 06/28/2020, 11/18/2020, 05/05/2021   Pfizer Covid-19 Vaccine Bivalent Booster 61yr & up 05/05/2021   Pneumococcal Conjugate-13 03/01/2015    Pneumococcal Polysaccharide-23 08/20/1998, 02/22/1999   Td 04/29/2006   Zoster Recombinat (Shingrix) 06/14/2020   Zoster, Live 06/23/2007   Zoster, Unspecified 04/13/2000    TDAP status: Due, Education has been provided regarding the importance of this vaccine. Advised may receive this vaccine at local pharmacy or Health Dept. Aware to provide a copy of the vaccination record if obtained from local pharmacy or Health Dept. Verbalized acceptance and understanding.  Flu Vaccine status: Up to date  Pneumococcal vaccine status: Up to date  Covid-19 vaccine status: Completed vaccines  Qualifies for Shingles Vaccine? Yes   Zostavax completed Yes   Shingrix Completed?: Yes  Screening Tests Health Maintenance  Topic Date Due   FOOT EXAM  Never done   OPHTHALMOLOGY EXAM  Never done   Zoster Vaccines- Shingrix (2 of 2) 08/09/2020   HEMOGLOBIN A1C  02/21/2022   COVID-19 Vaccine (6 - Booster for Pfizer series) 04/04/2022 (Originally 06/30/2021)   Pneumonia Vaccine 81 Years old (4 - PPSV23 or PCV20) 12/02/2022 (Originally 02/29/2016)  TETANUS/TDAP  12/02/2022 (Originally 04/29/2016)   INFLUENZA VACCINE  03/20/2022   COLONOSCOPY (Pts 45-33yr Insurance coverage will need to be confirmed)  03/05/2023   MAMMOGRAM  03/07/2023   DEXA SCAN  Completed   HPV VACCINES  Aged Out    Health Maintenance  Health Maintenance Due  Topic Date Due   FOOT EXAM  Never done   OPHTHALMOLOGY EXAM  Never done   Zoster Vaccines- Shingrix (2 of 2) 08/09/2020   HEMOGLOBIN A1C  02/21/2022    Colorectal cancer screening: Type of screening: Colonoscopy. Completed 03/04/20. Repeat every 3 years  Mammogram status: Completed 03/06/22. Repeat every year     Additional Screening:   Vision Screening: Recommended annual ophthalmology exams for early detection of glaucoma and other disorders of the eye. Is the patient up to date with their annual eye exam?  Yes  Who is the provider or what is the name of the  office in which the patient attends annual eye exams? Dr MEllie LunchIf pt is not established with a provider, would they like to be referred to a provider to establish care? No .   Dental Screening: Recommended annual dental exams for proper oral hygiene  Community Resource Referral / Chronic Care Management: CRR required this visit?  No   CCM required this visit?  No      Plan:     I have personally reviewed and noted the following in the patient's chart:   Medical and social history Use of alcohol, tobacco or illicit drugs  Current medications and supplements including opioid prescriptions.  Functional ability and status Nutritional status Physical activity Advanced directives List of other physicians Hospitalizations, surgeries, and ER visits in previous 12 months Vitals Screenings to include cognitive, depression, and falls Referrals and appointments  In addition, I have reviewed and discussed with patient certain preventive protocols, quality metrics, and best practice recommendations. A written personalized care plan for preventive services as well as general preventive health recommendations were provided to patient.     TWillette Brace LPN   [id number]  Nurse Notes: none

## 2022-03-16 ENCOUNTER — Encounter: Payer: Self-pay | Admitting: Pulmonary Disease

## 2022-03-16 ENCOUNTER — Ambulatory Visit: Payer: PPO | Admitting: Pulmonary Disease

## 2022-03-16 VITALS — BP 152/70 | HR 77 | Ht 69.0 in | Wt 205.4 lb

## 2022-03-16 DIAGNOSIS — J984 Other disorders of lung: Secondary | ICD-10-CM

## 2022-03-16 NOTE — Patient Instructions (Signed)
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  Repeat CT scan in 2 years or if symptomatic, consider sooner  Follow-up with me in 1 yeaer

## 2022-03-16 NOTE — Progress Notes (Signed)
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  07/03/21 Since our last visit, she reports a light dry cough in the last several weeks. Denies shortness of breath or wheezing. Attributes this to change in weather and after switching from air conditioning to heating in her home. She does not feel like this limits her activity or quality of life. Her last PET in July demonstrated hypermetabolic nodules in the left parotid which led to biopsy which was fortunately negative for malignancy.  11/07/21 On her last visit she had a mild cough. She scheduled an appointment when her cough worsened but then resolved after discontinuing her ACEI three weeks ago. No shortness of breath or wheezing.  03/16/22 Since our last visit she reports overall feeling well. Minor non-productive cough. Denies shortness of breath or wheezing.  Social History: Never smoker Works at make a Visual merchandiser exposures: Radiation  Past Medical History:  Diagnosis Date   Anxiety    Arthritis    right thumb CMC arthritis   Asthmatic bronchitis 2017   Cancer ([place])    -NHL R parotid   Cataract    bilateral small- removed both eyes    Colon cancer ([place])    stage 1- at appendiceal orifice    Complication of anesthesia    has dry mouth anyway from shograns-sore throats post op   Coronary artery calcification seen on CAT scan 11/14/2019   Depression    since 1982    Diverticulosis    Fibromyalgia    GERD (gastroesophageal reflux disease)    past hx    Heart murmur    Hyperlipidemia    controlled - metabolic syndrome  per pt- goes up and down    Hypertension    Lung infiltrate on CT 09/14/2015   Meningioma ([place]) 11/2009   right post parietal 17m - stable 11/2009 MRI   Nephrolithiasis    Nephrolithiasis    Neuromuscular disorder ([place]    fibromyalgia   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     augmented betamethasone dipropionate (DIPROLENE-AF) 0.05 % cream Apply 1 a small amount to affected area twice a day     Cholecalciferol (VITAMIN D3) 50 MCG (2000 UT) TABS Take 2 tablets by mouth daily.     clotrimazole-betamethasone (LOTRISONE) cream Apply topically 2 (two) times daily.     hydrALAZINE (APRESOLINE) 50 MG tablet Take 1 tablet (50 mg total) by mouth in the morning and at bedtime. 12 HOURS APART (Patient taking differently: Take 25 mg by mouth in the morning and at bedtime. 12 HOURS APART) 180 tablet 1   losartan (COZAAR) 50 MG tablet Take 1 tablet (50 mg total) by mouth daily. (Patient taking differently: Take 25 mg by mouth daily.) 90 tablet 3   simvastatin (  ZOCOR) 20 MG tablet Take 20 mg by mouth at bedtime.      Calcium Carbonate-Vitamin D 600-200 MG-UNIT TABS Take 1 tablet by mouth daily. (Patient not taking: Reported on 03/16/2022)     Magnesium 250 MG TABS Take 2 tablets by mouth daily. (Patient not taking: Reported on 03/16/2022)     PFIZER COVID-19 VAC BIVALENT injection      PREVNAR 20 0.5 ML injection      SHINGRIX injection      No facility-administered medications prior to visit.    Review of Systems  Constitutional:  Negative for chills, diaphoresis, fever, malaise/fatigue and weight loss.  HENT:  Negative for congestion.   Respiratory:  Negative for  cough, hemoptysis, sputum production, shortness of breath and wheezing.   Cardiovascular:  Negative for chest pain, palpitations and leg swelling.     Objective:   Vitals:   03/16/22 0901  BP: (!) 152/70  Pulse: 77  SpO2: 94%  Weight: 205 lb 6.4 oz (93.2 kg)  Height: '5\' 9"'$  (1.753 m)   SpO2: 94 %  Physical Exam: General: Well-appearing, no acute distress HENT: [place], AT Eyes: EOMI, no scleral icterus Respiratory: Clear to auscultation bilaterally.  No crackles, wheezing or rales Cardiovascular: RRR, -M/R/G, no JVD Extremities:-Edema,-tenderness Neuro: AAO x4, CNII-XII grossly intact Psych: Normal mood, normal affect  Data Reviewed:  Imaging: PET/CT 02/06/18 - Negative for hypermetabolic activity. Thin-walled cysts in mid and lower lungs bilaterally.  CT Chest 07/08/20 - Basilar predominance of thin-walled cysts of varying sizes. Some cysts with calcifications.  PET/CT 03/26/75 - Hypermetabolic nodules in left parotid. Similar low-level hypermetabolism in LLL concerning for inflammatory etiology. Stable cystic lung disease  CT Chest 03/05/22 - Stable thin walled cysts of varying sizes. Some cyts with calcifications.   PFT: 11/01/20 FVC 2.23 (67%) FEV1 1.75 (71%) Ratio 78  TLC 109% DLCO 71%. FVC increased by 12% and FEV1 increased by 13% Interpretation: Reversible obstructive defect present on spirometry. Increased RV and RV/TLC consistent with air trapping. Mildly reduced gas exchange  Labs: 10/18/15 ANA 1:1280 SSA >8 SSB>8 CCP neg  Echo: 12/08/19 EF 70-75%. Age-related DD. No WMA or valvular abnormalities.  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 CT reviewed. Stable cystic lung disease. No annual follow-up  warranted based on stability, absence of symptoms and age. Could consider CT in 2 years if desired.  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  Repeat CT scan in 2 years or if symptomatic, consider sooner  Health Maintenance Immunization History  Administered Date(s) Administered   Influenza Split 05/15/2012, 04/28/2014, 05/21/2015   Influenza Whole 05/21/2007, 05/19/2008, 06/01/2010   Influenza, High Dose Seasonal PF 05/09/2021   Influenza,inj,Quad PF,6+ Mos 05/01/2016, 05/10/2017, 05/13/2018, 05/07/2019, 05/12/2020   Influenza,inj,quad, With Preservative 06/14/2015   PFIZER(Purple Top)SARS-COV-2 Vaccination 10/03/2019, 10/25/2019, 06/28/2020, 11/18/2020, 05/05/2021   Pfizer Covid-19 Vaccine Bivalent Booster 69yr & up 05/05/2021   Pneumococcal Conjugate-13 03/01/2015   Pneumococcal Polysaccharide-23 08/20/1998, 02/22/1999   Td 04/29/2006  Zoster Recombinat (Shingrix) 06/14/2020   Zoster, Live 06/23/2007   Zoster, Unspecified 04/13/2000   No orders of the defined types were placed in this encounter.  No orders of the defined types were placed in this encounter.  No follow-ups on file.  I have spent a total time of 31-minutes on the day of the appointment including chart review, data review, collecting history, coordinating care and discussing medical diagnosis and plan with the patient/family. Past medical history, allergies, medications were reviewed. Pertinent imaging, labs and tests included in this note have been reviewed and interpreted independently by me.  [place], MD [facility] 03/16/2022 11:55 AM   Office Number [phone]

## 2022-03-19 DIAGNOSIS — H0015 Chalazion left lower eyelid: Secondary | ICD-10-CM | POA: Diagnosis not present

## 2022-03-19 DIAGNOSIS — H26491 Other secondary cataract, right eye: Secondary | ICD-10-CM | POA: Diagnosis not present

## 2022-03-21 DIAGNOSIS — H26491 Other secondary cataract, right eye: Secondary | ICD-10-CM | POA: Diagnosis not present

## 2022-03-22 NOTE — Telephone Encounter (Signed)
Left message to call back  

## 2022-03-23 NOTE — Telephone Encounter (Signed)
March 21, 2022 [name], MD to Jillian Beaver, LPN  Me      [birth year] 37:95 PM It is reasonable to try a lower dose.  Her options are limited by side effects and inability to take a diuretic.  It is worth a try.  Sorry she did not get a call back.   TCR   12:30 pm blood pressure 162/72 and running in the 160's   Yesterday had episode of "thundering heart beat"  Started at rest, was reading. Episode lasted a little over an hour   Has follow up 04/04/2022 with Pharm D   Will forward to [name] for review

## 2022-03-23 NOTE — Telephone Encounter (Signed)
Follow Up:    Patient is returning Melinda's call from yesterday.

## 2022-03-27 NOTE — Telephone Encounter (Signed)
March 27, 2022 [name], MD to Me      03/27/22  1:01 PM I don't know what she means by a thundering heart beat.  She can wear a 7 day monitor.  Keep f/u as scheduled.   TCR   Discussed with patent monitor and "thundering heart beat", stated had improved She does not want to wear a monitor Stated she has had itching on arms for about 3 weeks and will discuss with Pharm D next week  Tried to discuss further, she stated she had to go she was meeting someone Advised to call back if anything changed between now and her appointment next week

## 2022-04-02 ENCOUNTER — Telehealth: Payer: Self-pay

## 2022-04-02 NOTE — Telephone Encounter (Signed)
Called and reschedule her appt on 8/18 to 8/22. She is aware of appt time/date.

## 2022-04-03 ENCOUNTER — Ambulatory Visit: Payer: PPO | Admitting: Orthopedic Surgery

## 2022-04-03 DIAGNOSIS — M65331 Trigger finger, right middle finger: Secondary | ICD-10-CM | POA: Diagnosis not present

## 2022-04-03 DIAGNOSIS — M152 Bouchard's nodes (with arthropathy): Secondary | ICD-10-CM

## 2022-04-03 MED ORDER — BETAMETHASONE SOD PHOS & ACET 6 (3-3) MG/ML IJ SUSP
6.0000 mg | INTRAMUSCULAR | Status: AC | PRN
  Administered 2022-04-03: 6 mg via INTRA_ARTICULAR

## 2022-04-03 MED ORDER — LIDOCAINE HCL 1 % IJ SOLN
1.0000 mL | INTRAMUSCULAR | Status: AC | PRN
  Administered 2022-04-03: 1 mL

## 2022-04-03 NOTE — Progress Notes (Signed)
Office Visit Note   Patient: [name]           Date of Birth: 1941-04-18           MRN: [id number] Visit Date: 04/03/2022              Requested by: Tawnya Crook, MD [place],  [place] [id number] PCP: Tawnya Crook, MD   Assessment & Plan: Visit Diagnoses:  1. Trigger finger, right middle finger   2. 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  She was last seen in January at which time she underwent CSI into the right middle finger A1 pulley.  She had significant symptom relief for many months.  She has a flexion contracture of her PIP joint which has been present since an injury back in October.  The pain and triggering is a much bigger issue for her.  She would like a repeat CSI today.  We discussed that if this injection fails to completely resolve her symptoms then she may benefit from open A1 pulley release.  I can see her back again as needed.   Follow-Up Instructions: No follow-ups on file.   Orders:  No orders of the defined types were placed in this encounter.  No orders of the defined types were placed in this encounter.     Procedures: Hand/UE Inj: R long A1 for trigger finger on 04/03/2022 10:09 AM Indications: tendon swelling and therapeutic Details: 25 G needle, volar approach Medications: 1 mL lidocaine 1 %; 6 mg betamethasone acetate-betamethasone sodium phosphate 6 (3-3) MG/ML Procedure, treatment alternatives, risks and benefits explained, specific risks discussed. Consent was given by the patient. Immediately prior to procedure a time out was called to verify the correct patient, procedure, equipment, support staff and site/side marked as required. Patient was prepped and draped in the usual sterile fashion.       Clinical Data: No additional  findings.   Subjective: Chief Complaint  Patient presents with   Right Middle Finger - Follow-up    This is an 81 yo RHD F who presents with continued pain in the right middle finger.  She was last seen in January at which time she underwent CSI into this middle finger A1 pulley.  She had significant symptom relief for many months.  The pain has returned and remains localized to the middle finger MCPJ/A1 pulley.  She has a flexion contracture at the PIP joint that she thinks was much better following the corticosteroid injection.  She is interested in a repeat injection today.     Review of Systems   Objective: Vital Signs: There were no vitals taken for this visit.  Physical Exam Constitutional:      Appearance: Normal appearance.  Cardiovascular:     Rate and Rhythm: Normal rate.     Pulses: Normal pulses.  Pulmonary:     Effort: Pulmonary effort is normal.  Skin:    General: Skin is warm and dry.     Capillary Refill: Capillary refill takes less than 2 seconds.  Neurological:     Mental Status: She is alert.     Right Hand Exam   Tenderness  Right hand tenderness location: TTP directly over the A1 pulley with palpable nodule.  Other  Erythema: absent Sensation: normal Pulse: present  Comments:  Approx 30-40 deg flexion contracture at the PIP joint that is partially passively correctable.  Palpable triggering of finger with terminal passive flexion.       Specialty Comments:  No specialty comments available.  Imaging: No results found.   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    Obstructive lung disease (generalized) ([place]) 11/07/2020   Abnormal lung scan 07/12/2020   Left-sided thoracic back pain 07/12/2020   Multiple complaints 02/09/2020   Coronary artery calcification 11/14/2019   Pure hypercholesterolemia 11/14/2019   Skin rash 02/14/2016   Left flank pain 02/14/2016   Hypersensitivity reaction 12/02/2015   Essential hypertension 09/15/2015   Pulmonary nodule 09/13/2015   History of colon cancer, stage I 08/31/2015   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[place]) 08/31/2015   H/O superficial parotidectomy 08/01/2015   Other abnormal glucose 01/23/2013   Left-sided headache 01/15/2013   Chronic diarrhea 05/15/2012   Bruising 01/25/2012   Palpitations 11/12/2011   Atypical chest pain 11/12/2011   Preventative health care 05/15/2011   GERD 12/26/2009   CONTACT DERMATITIS 09/02/2007   DE QUERVAIN'S TENOSYNOVITIS 09/02/2007   MENINGIOMA 07/22/2007   DIVERTICULOSIS, COLON 07/22/2007   Fibromyalgia 07/22/2007   HYPERLIPIDEMIA 07/21/2007   SJOGREN'S SYNDROME 07/21/2007   Past Medical History:  Diagnosis Date   Anxiety    Arthritis    right thumb CMC arthritis   Asthmatic bronchitis 2017   Cancer ([place])    -NHL R parotid   Cataract    bilateral small- removed both eyes    Colon cancer ([place])    stage 1- at appendiceal orifice    Complication of anesthesia    has dry mouth anyway from shograns-sore throats post op   Coronary artery calcification seen on CAT scan 11/14/2019   Depression    since 1982    Diverticulosis    Fibromyalgia    GERD (gastroesophageal reflux disease)    past hx    Heart murmur    Hyperlipidemia    controlled - metabolic syndrome per pt- goes up and down    Hypertension    Lung infiltrate on CT 09/14/2015   Meningioma ([place]) 11/2009   right post parietal 62m - stable 11/2009 MRI   Nephrolithiasis    Nephrolithiasis    Neuromuscular disorder ([facility]    fibromyalgia   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 ([facility] 08/31/2015   Pure hypercholesterolemia 11/14/2019   S[place]ogren's disease ([place]     Family History  Problem Relation Age of Onset   Colon cancer Mother [id number]  Colon cancer Father        unknown when onset was   Suicidality Son    Diabetes Neg Hx    Stomach cancer Neg Hx    Colon polyps Neg Hx    Esophageal cancer Neg Hx    Rectal cancer Neg Hx     Past Surgical History:  Procedure Laterality Date   ABDOMINAL HYSTERECTOMY     APPENDECTOMY  2008   ARTERY BIOPSY  02/03/2013   Procedure: MINOR BIOPSY TEMPORAL ARTERY;  Surgeon: SAscencion Dike MD;  [facility]  Service: ENT;;   COLONOSCOPY     [place]   LEFT HEART CATH AND CORONARY ANGIOGRAPHY N/A 12/01/2019   Procedure: LEFT HEART CATH AND CORONARY ANGIOGRAPHY;  Surgeon: Troy Sine, MD;  Location: [facility];  Service: Cardiovascular;  Laterality: N/A;   MASS EXCISION  2008   abd-benign   PAROTIDECTOMY Right 08/01/2015   Procedure: RIGHT PAROTIDECTOMY;  Surgeon: [name], MD;  Location: [facility];  Service: ENT;  Laterality: Right;   PAROTIDECTOMY Left 04/04/2021   Procedure: LEFT PAROTIDECTOMY;  Surgeon: [name], MD;  Location: [facility];  Service: ENT;  Laterality: Left;   POLYPECTOMY     TONSILLECTOMY     VAGINAL HYSTERECTOMY  1989   Social History   Occupational History   Occupation: Retired    Fish farm manager: UNEMPLOYED  Tobacco Use   Smoking status: Never   Smokeless tobacco: Never   Tobacco comments:    Passive exposure through her mother.  Vaping Use   Vaping Use: Never used  Substance and Sexual Activity   Alcohol use: Yes    Alcohol/week: 0.0 standard drinks of alcohol    Comment: Rarely   Drug use: No   Sexual activity: Not Currently    Comment: [place] sister.

## 2022-04-04 ENCOUNTER — Ambulatory Visit: Payer: PPO | Admitting: Pharmacist

## 2022-04-04 VITALS — BP 173/80 | HR 76 | Wt 206.2 lb

## 2022-04-04 DIAGNOSIS — I10 Essential (primary) hypertension: Secondary | ICD-10-CM | POA: Diagnosis not present

## 2022-04-04 NOTE — Progress Notes (Signed)
Patient ID: [name]                 DOB: 11/01/1940                      MRN: [id number]     HPI: [name] is a 81 y.o. female referred by Dr. Oval Linsey to HTN clinic. PMH is significant for HTN, Sjogrens Syndrome and CAD.  Has history of lymphoma and colon cancer.  Patient presents today by herself. Reports stress and depression since 1982 when her family had a tragedy. She copes by speaking with her friend frequently. Is also stressed regarding her niece who is mentally disabled and lives with her sister. She reports it is not an ideal situation and this is causing anxiety and she believes it is the root cause of her HTN.  At last visit with [name], hydralazine was added at '50mg'$  BID and losartan was reduced to '50mg'$  daily from '100mg'$ .  However, she did not feel comfortable taking hydralazine '50mg'$  twice daily so she has only been taking '25mg'$ .  Reports she could not tolerate amlodipine.  Current HTN meds:   Hydralazine '25mg'$  BID Losartan '50mg'$  daily  BP goal: <130/80  Wt Readings from Last 3 Encounters:  04/04/22 206 lb 3.2 oz (93.5 kg)  03/16/22 205 lb 6.4 oz (93.2 kg)  03/01/22 205 lb 6.4 oz (93.2 kg)   BP Readings from Last 3 Encounters:  04/04/22 (!) 173/80  03/16/22 (!) 152/70  03/01/22 (!) 174/72   Pulse Readings from Last 3 Encounters:  04/04/22 76  03/16/22 77  03/01/22 84    Renal function: CrCl cannot be calculated (Patient's most recent lab result is older than the maximum 21 days allowed.).  Past Medical History:  Diagnosis Date   Anxiety    Arthritis    right thumb CMC arthritis   Asthmatic bronchitis 2017   Cancer ([place])    -NHL R parotid   Cataract    bilateral small- removed both eyes    Colon cancer ([place])    stage 1- at appendiceal orifice    Complication of anesthesia    has dry mouth anyway from shograns-sore throats post op   Coronary artery calcification seen on CAT scan 11/14/2019   Depression    since 1982    Diverticulosis     Fibromyalgia    GERD (gastroesophageal reflux disease)    past hx    Heart murmur    Hyperlipidemia    controlled - metabolic syndrome per pt- goes up and down    Hypertension    Lung infiltrate on CT 09/14/2015   Meningioma ([place]) 11/2009   right post parietal 59m - stable 11/2009 MRI   Nephrolithiasis    Nephrolithiasis    Neuromuscular disorder ([facility]    fibromyalgia   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[facility] [place] 08/31/2015   Pure hypercholesterolemia 11/14/2019   Sjogren's disease ([facility]     Current Outpatient Medications on File Prior to Visit  Medication Sig Dispense Refill   acetaminophen (TYLENOL) 500 MG tablet Take 500 mg by mouth every 6 (six) hours as needed for moderate pain.     augmented betamethasone dipropionate (DIPROLENE-AF) 0.05 % cream Apply 1 a small amount to affected area twice a day     bimatoprost (LATISSE) 0.03 % ophthalmic solution Apply to eye daily.     Cholecalciferol (VITAMIN D3) 50 MCG (2000 UT) TABS Take 2 tablets by mouth daily.  clotrimazole-betamethasone (LOTRISONE) cream Apply topically 2 (two) times daily.     hydrALAZINE (APRESOLINE) 50 MG tablet Take 1 tablet (50 mg total) by mouth in the morning and at bedtime. 12 HOURS APART (Patient taking differently: Take 25 mg by mouth in the morning and at bedtime. 12 HOURS APART) 180 tablet 1   losartan (COZAAR) 50 MG tablet Take 2 tablets (100 mg total) by mouth daily. 90 tablet 3   PFIZER COVID-19 VAC BIVALENT injection      PREVNAR 20 0.5 ML injection      SHINGRIX injection      simvastatin (ZOCOR) 20 MG tablet Take 20 mg by mouth at bedtime.      No current facility-administered medications on file prior to visit.    Allergies  Allergen Reactions   Lisinopril Cough     Assessment/Plan:  1. Hypertension -    HYPERTENSION CONTROL Vitals:   04/04/22 1011 04/04/22 1033  BP: (!) 178/80 (!) 173/80    The patient's blood pressure is elevated above target today.  In order to  address the patient's elevated BP: A current anti-hypertensive medication was adjusted today.    Patient BP today 178/80 (checked twice) which is above goal of <130/80.  Patient's stressors are likely contributory.  Advised that since she did not want to take '50mg'$  of hydralazine twice a day and felt better on losartan, she could increase losartan back to her previous dose of '100mg'$ . Patient agreeable. Will likely need further titration of hydralazine. Will recheck in 1 month.  Continue hydralazine '25mg'$  BID Increase losartan to '100mg'$  daily Recheck in 4 weeks  Karren Cobble, PharmD, [place], [place], [place], [place], [place], [address] Phone: [phone], Fax: [phone]

## 2022-04-04 NOTE — Patient Instructions (Addendum)
It was nice meeting you today  We would like your blood pressure to be less than 130/80  Please continue your hydralazine '25mg'$  twice a day  Increase losartan back to '100mg'$  (take two of your '50mg'$  tablets or slices)  Please continue to monitor your blood pressure at home   Karren Cobble, PharmD, [place], [place], [place], [place], [place], [address] Phone: [phone], Fax: [phone]

## 2022-04-06 ENCOUNTER — Inpatient Hospital Stay: Payer: PPO

## 2022-04-06 ENCOUNTER — Inpatient Hospital Stay: Payer: PPO | Admitting: Hematology and Oncology

## 2022-04-10 ENCOUNTER — Telehealth: Payer: Self-pay

## 2022-04-10 ENCOUNTER — Inpatient Hospital Stay: Payer: PPO | Attending: Hematology and Oncology

## 2022-04-10 ENCOUNTER — Encounter: Payer: Self-pay | Admitting: Hematology and Oncology

## 2022-04-10 ENCOUNTER — Other Ambulatory Visit: Payer: Self-pay

## 2022-04-10 ENCOUNTER — Inpatient Hospital Stay (HOSPITAL_BASED_OUTPATIENT_CLINIC_OR_DEPARTMENT_OTHER): Payer: PPO | Admitting: Hematology and Oncology

## 2022-04-10 DIAGNOSIS — D539 Nutritional anemia, unspecified: Secondary | ICD-10-CM | POA: Diagnosis not present

## 2022-04-10 DIAGNOSIS — Z85038 Personal history of other malignant neoplasm of large intestine: Secondary | ICD-10-CM | POA: Diagnosis not present

## 2022-04-10 DIAGNOSIS — I209 Angina pectoris, unspecified: Secondary | ICD-10-CM

## 2022-04-10 DIAGNOSIS — C8588 Other specified types of non-Hodgkin lymphoma, lymph nodes of multiple sites: Secondary | ICD-10-CM

## 2022-04-10 DIAGNOSIS — Z8572 Personal history of non-Hodgkin lymphomas: Secondary | ICD-10-CM | POA: Insufficient documentation

## 2022-04-10 DIAGNOSIS — I10 Essential (primary) hypertension: Secondary | ICD-10-CM

## 2022-04-10 LAB — CMP (CANCER CENTER ONLY)
ALT: 12 U/L (ref 0–44)
AST: 13 U/L — ABNORMAL LOW (ref 15–41)
Albumin: 4.1 g/dL (ref 3.5–5.0)
Alkaline Phosphatase: 80 U/L (ref 38–126)
Anion gap: 3 — ABNORMAL LOW (ref 5–15)
BUN: 12 mg/dL (ref 8–23)
CO2: 30 mmol/L (ref 22–32)
Calcium: 10 mg/dL (ref 8.9–10.3)
Chloride: 105 mmol/L (ref 98–111)
Creatinine: 0.62 mg/dL (ref 0.44–1.00)
GFR, Estimated: >60 mL/min (ref >60)
Glucose, Bld: 95 mg/dL (ref 70–99)
Potassium: 4.3 mmol/L (ref 3.5–5.1)
Sodium: 138 mmol/L (ref 135–145)
Total Bilirubin: 0.3 mg/dL (ref 0.3–1.2)
Total Protein: 7.1 g/dL (ref 6.5–8.1)

## 2022-04-10 LAB — CBC WITH DIFFERENTIAL (CANCER CENTER ONLY)
Abs Immature Granulocytes: 0.02 10*3/uL (ref 0.00–0.07)
Basophils Absolute: 0 10*3/uL (ref 0.0–0.1)
Basophils Relative: 1 %
Eosinophils Absolute: 0.2 10*3/uL (ref 0.0–0.5)
Eosinophils Relative: 3 %
HCT: 35 % — ABNORMAL LOW (ref 36.0–46.0)
Hemoglobin: 11.9 g/dL — ABNORMAL LOW (ref 12.0–15.0)
Immature Granulocytes: 0 %
Lymphocytes Relative: 14 %
Lymphs Abs: 1 10*3/uL (ref 0.7–4.0)
MCH: 30.2 pg (ref 26.0–34.0)
MCHC: 34 g/dL (ref 30.0–36.0)
MCV: 88.8 fL (ref 80.0–100.0)
Monocytes Absolute: 0.8 10*3/uL (ref 0.1–1.0)
Monocytes Relative: 12 %
Neutro Abs: 5.1 10*3/uL (ref 1.7–7.7)
Neutrophils Relative %: 70 %
Platelet Count: 235 10*3/uL (ref 150–400)
RBC: 3.94 MIL/uL (ref 3.87–5.11)
RDW: 12.6 % (ref 11.5–15.5)
WBC Count: 7.1 10*3/uL (ref 4.0–10.5)
nRBC: 0 % (ref 0.0–0.2)

## 2022-04-10 LAB — FERRITIN: Ferritin: 46 ng/mL (ref 11–307)

## 2022-04-10 LAB — CEA (IN HOUSE-CHCC): CEA (CHCC-In House): 1.11 ng/mL (ref 0.00–5.00)

## 2022-04-10 LAB — IRON AND IRON BINDING CAPACITY (CC-WL,HP ONLY)
Iron: 85 ug/dL (ref 28–170)
Saturation Ratios: 25 % (ref 10.4–31.8)
TIBC: 336 ug/dL (ref 250–450)
UIBC: 251 ug/dL (ref 148–442)

## 2022-04-10 LAB — VITAMIN B12: Vitamin B-12: 208 pg/mL (ref 180–914)

## 2022-04-10 LAB — LACTATE DEHYDROGENASE: LDH: 107 U/L (ref 98–192)

## 2022-04-10 NOTE — Progress Notes (Signed)
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 [facility]) Clinically, I could not detect any signs or lymphadenopathy suggest cancer recurrence I will see her again next year for further follow-up There is no benefit for routine surveillance imaging  History of colon cancer, stage I She had remote history of colon cancer Her anemia is not related Tumor markers negative She does not need surveillance imaging  Essential hypertension She has history of poorly controlled hypertension The cardiology group is managing her blood pressure well I recommend close monitoring and follow-up with her physician  Deficiency anemia She has borderline anemia Iron studies and B12 are adequate but borderline low Observe closely  No orders of the defined types were placed in this encounter.   All questions were answered. The patient knows to call the clinic with any problems, questions or concerns. The total time spent in the appointment was 20 minutes encounter with patients including review of chart and various tests results, discussions about plan of care and coordination of care plan   Heath Lark, MD 04/10/2022 4:53 PM  INTERVAL HISTORY: Please see below for problem oriented charting. she returns for surveillance follow-up She denies new lymphadenopathy She has intermittent discomfort on the right jaw and when I saw her last time, she had nonspecific chest pain and was referred to see cardiologist Her blood pressure is better controlled She was seen by pulmonologist recently and is being observed for abnormal lung changes  REVIEW OF SYSTEMS:    Constitutional: Denies fevers, chills or abnormal weight loss Eyes: Denies blurriness of vision Ears, nose, mouth, throat, and face: Denies mucositis or sore throat Respiratory: Denies cough, dyspnea or wheezes Cardiovascular: Denies palpitation, chest discomfort or lower extremity swelling Gastrointestinal:  Denies nausea, heartburn or change in bowel habits Skin: Denies abnormal skin rashes Lymphatics: Denies new lymphadenopathy or easy bruising Neurological:Denies numbness, tingling or new weaknesses Behavioral/Psych: Mood is stable, no new changes  All other systems were reviewed with the patient and are negative.  I have reviewed the past medical history, past surgical history, social history and family history with the patient and they are unchanged from previous note.  ALLERGIES:  is allergic to lisinopril.  MEDICATIONS:  Current Outpatient Medications  Medication Sig Dispense Refill   acetaminophen (TYLENOL) 500 MG tablet Take 500 mg by mouth every 6 (six) hours as needed for moderate pain.     augmented betamethasone dipropionate (DIPROLENE-AF) 0.05 % cream Apply 1 a small amount to affected area twice a day     Cholecalciferol (VITAMIN D3) 50 MCG (2000 UT) TABS Take 2 tablets by mouth daily.     clotrimazole-betamethasone (LOTRISONE) cream Apply topically 2 (two) times daily.     hydrALAZINE (APRESOLINE) 50 MG tablet Take 1 tablet (50 mg total) by mouth in the morning and at bedtime. 12 HOURS APART (Patient taking differently: Take 25 mg by mouth in the morning and at bedtime. 12 HOURS APART/ Take 25 mg BID) 180 tablet 1   losartan (COZAAR) 50 MG tablet Take 2 tablets (100 mg total) by mouth daily. 90 tablet 3   PFIZER COVID-19 VAC BIVALENT injection      PREVNAR 20 0.5 ML injection  SHINGRIX injection      simvastatin (ZOCOR) 20 MG tablet Take 20 mg by mouth at bedtime.      No current facility-administered medications for this visit.    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 Please refer to report for other findings   11/10/2015 Procedure   she underwent CT guided biopsy   11/10/2015 Pathology Results   Accession: [id number] right lung CT-guided biopsy suspicious for lymphoma   12/02/2015 - 12/23/2015 Chemotherapy   She received 4 doses of weekly rituximab.   02/10/2016 PET scan   There is no evidence for residual or recurrent hypermetabolic adenopathy   02/06/5092 Imaging   The masslike area of concern in the medial right lung base seen on the previous study is unchanged in size since March 2017. A biopsy was previously recommended after the CT scan from October 26, 2015. Please see that report for further recommendations as the finding was better characterized at that time. 2. No pulmonary emboli. 3. Multiple thin walled cysts in the mid and lower lungs is stable. 4. 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   02/06/2017 PET scan   No evidence of active lymphomatous involvement. Stable 1.5 cm mixed cystic/ solid nodule in the right lower lobe, non FDG avid. Continued attention on follow-up is suggested.   02/13/2017 Procedure   Colonoscopy - One 8 mm polyp in the cecum, removed with a cold snare. Resected and retrieved. - Two 3 to 4 mm polyps in the transverse colon, removed with a cold biopsy forceps. Resected and retrieved. - One 5 mm polyp in the descending colon, removed with a cold snare. Resected and retrieved. - Two 3 to 5 mm polyps at the recto-sigmoid colon, removed with a cold snare. Resected and retrieved. - Mild diverticulosis in the sigmoid colon. - Internal hemorrhoids.      02/06/2018 PET scan    Negative PET-CT. No findings for enlarged or hypermetabolic lymph nodes to suggest lymphoma.   02/24/2021 PET scan   1. Hypermetabolic nodules are nodes centered about the left parotid gland, most likely indicative of recurrent lymphoma-given clinical history. 2. No evidence of hypermetabolic adenopathy outside of the neck.  3. Similar low-level hypermetabolism corresponding to an area of vague left lower lobe increased density. 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 4. Incidental findings, including: Coronary artery atherosclerosis. Aortic Atherosclerosis (ICD10-I70.0). Tiny hiatal hernia.     04/04/2021 Surgery   PREOPERATIVE DIAGNOSES: 1. Left parotid mass   POSTOPERATIVE DIAGNOSES: 1. Left parotid mass   PROCEDURE PERFORMED: Left lateral parotidectomy with facial nerve dissection     04/04/2021 Pathology Results   A. PAROTID GLAND, LEFT, PAROTIDECTOMY:  -  Lymphoepithelial sialadenitis  -  See comment   COMMENT:   The excision specimen consists of tissue submitted as left parotid.  It is composed of nodular lymphoepithelial lesions and interspersed fibrosis.  Cytokeratin AE1/3 and p40 highlight the epithelial component within the nodular lymphoid proliferations.  The lymphocytes are composed of an admixture of B and T cells, by CD20 and CD3, respectively.  The B cells do not aberrantly express CD5, CD23, CD43 or cyclin D1.  Scattered  germinal centers with polarization are highlighted by CD10, BCL6, Bcl-2 (negative) and Ki-67.  Plasma cells are polytypic (CD138, kappa and lambda by in situ hybridization).  Flow cytometry performed on the sample (see [id number]), did not identify a monoclonal B or phenotypically aberrant T-cell population.  Overall, there is no definitive evidence of a lymphoproliferative process in the examined material.      PHYSICAL EXAMINATION: ECOG PERFORMANCE STATUS: 1 - Symptomatic but completely ambulatory  Vitals:    04/10/22 1054  BP: (!) 125/48  Pulse: 72  Resp: 18  Temp: (!) 97.4 F (36.3 C)  SpO2: 95%   Filed Weights   04/10/22 1054  Weight: 202 lb 9.6 oz (91.9 kg)    GENERAL:alert, no distress and comfortable SKIN: skin color, texture, turgor are normal, no rashes or significant lesions EYES: normal, Conjunctiva are pink and non-injected, sclera clear OROPHARYNX:no exudate, no erythema and lips, buccal mucosa, and tongue normal  NECK: supple, thyroid normal size, non-tender, without nodularity.  Noted well-healed surgical scar LYMPH:  no palpable lymphadenopathy in the cervical, axillary or inguinal LUNGS: clear to auscultation and percussion with normal breathing effort HEART: regular rate & rhythm and no murmurs and no lower extremity edema ABDOMEN:abdomen soft, non-tender and normal bowel sounds Musculoskeletal:no cyanosis of digits and no clubbing  NEURO: alert & oriented x 3 with fluent speech, no focal motor/sensory deficits  LABORATORY DATA:  I have reviewed the data as listed    Component Value Date/Time   NA 138 04/10/2022 1026   NA 143 11/25/2019 0842   NA 143 02/06/2017 1302   K 4.3 04/10/2022 1026   K 4.8 02/06/2017 1302   CL 105 04/10/2022 1026   CO2 30 04/10/2022 1026   CO2 26 02/06/2017 1302   GLUCOSE 95 04/10/2022 1026   GLUCOSE 100 02/06/2017 1302   BUN 12 04/10/2022 1026   BUN 14 11/25/2019 0842   BUN 14.2 02/06/2017 1302   CREATININE 0.62 04/10/2022 1026   CREATININE 0.8 02/06/2017 1302   CALCIUM 10.0 04/10/2022 1026   CALCIUM 9.7 02/06/2017 1302   PROT 7.1 04/10/2022 1026   PROT 7.2 01/28/2020 0911   PROT 7.8 02/06/2017 1302   ALBUMIN 4.1 04/10/2022 1026   ALBUMIN 4.5 01/28/2020 0911   ALBUMIN 3.9 02/06/2017 1302   AST 13 (L) 04/10/2022 1026   AST 20 02/06/2017 1302   ALT 12 04/10/2022 1026   ALT 25 02/06/2017 1302   ALKPHOS 80 04/10/2022 1026   ALKPHOS 105 02/06/2017 1302   BILITOT 0.3 04/10/2022 1026   BILITOT 0.41 02/06/2017 1302   GFRNONAA  >60 04/10/2022 1026   GFRAA >60 02/09/2020 0818    No results found for: "SPEP", "UPEP"  Lab Results  Component Value Date   WBC 7.1 04/10/2022   NEUTROABS 5.1 04/10/2022   HGB 11.9 (L) 04/10/2022   HCT 35.0 (L) 04/10/2022   MCV 88.8 04/10/2022   PLT 235 04/10/2022      Chemistry      Component Value Date/Time   NA 138 04/10/2022 1026   NA 143 11/25/2019 0842   NA 143 02/06/2017 1302   K 4.3 04/10/2022 1026   K 4.8 02/06/2017 1302   CL 105 04/10/2022 1026   CO2 30 04/10/2022 1026   CO2 26 02/06/2017 1302   BUN 12 04/10/2022 1026   BUN 14 11/25/2019 0842   BUN 14.2 02/06/2017 1302   CREATININE 0.62 04/10/2022 1026   CREATININE 0.8 02/06/2017 1302      Component Value  Date/Time   CALCIUM 10.0 04/10/2022 1026   CALCIUM 9.7 02/06/2017 1302   ALKPHOS 80 04/10/2022 1026   ALKPHOS 105 02/06/2017 1302   AST 13 (L) 04/10/2022 1026   AST 20 02/06/2017 1302   ALT 12 04/10/2022 1026   ALT 25 02/06/2017 1302   BILITOT 0.3 04/10/2022 1026   BILITOT 0.41 02/06/2017 1302

## 2022-04-10 NOTE — Telephone Encounter (Signed)
Called and given below message. She verbalized understanding. 

## 2022-04-10 NOTE — Telephone Encounter (Signed)
-----   Message from Heath Lark, MD sent at 04/10/2022  3:25 PM EDT ----- Pls call her, iron studies and B12 are ok

## 2022-04-10 NOTE — Assessment & Plan Note (Signed)
She had remote history of colon cancer Her anemia is not related Tumor markers negative She does not need surveillance imaging

## 2022-04-10 NOTE — Assessment & Plan Note (Signed)
She has borderline anemia Iron studies and B12 are adequate but borderline low Observe closely

## 2022-04-10 NOTE — Assessment & Plan Note (Signed)
She has history of poorly controlled hypertension The cardiology group is managing her blood pressure well I recommend close monitoring and follow-up with her physician

## 2022-04-10 NOTE — Assessment & Plan Note (Signed)
Clinically, I could not detect any signs or lymphadenopathy suggest cancer recurrence I will see her again next year for further follow-up There is no benefit for routine surveillance imaging

## 2022-04-12 ENCOUNTER — Encounter: Payer: Self-pay | Admitting: Pharmacist

## 2022-04-17 ENCOUNTER — Telehealth: Payer: Self-pay | Admitting: Pharmacist

## 2022-04-17 NOTE — Telephone Encounter (Signed)
Patient called, confused about HTN regimen. Advised that losartan was increased to '100mg'$  daily and she could take 2 of her '50mg'$  tablets. Patient voiced understanding.

## 2022-04-25 ENCOUNTER — Other Ambulatory Visit: Payer: Self-pay | Admitting: *Deleted

## 2022-04-25 ENCOUNTER — Telehealth: Payer: Self-pay | Admitting: Family Medicine

## 2022-04-25 MED ORDER — SIMVASTATIN 20 MG PO TABS: 20.0000 mg | ORAL_TABLET | Freq: Every day | ORAL | 3 refills | Status: DC

## 2022-04-25 NOTE — Telephone Encounter (Signed)
Pt states she was instructed to call in when this Rx is running out.  Pt requests a 90 day supply   LAST APPOINTMENT DATE:   12/01/21 OV with PCP  NEXT APPOINTMENT DATE: 06/04/22 OV with PCP  MEDICATION: simvastatin (ZOCOR) 20 MG tablet [[id number]]    Is the patient out of medication?  Has three days left   PHARMACY: [facility], [address] AT [address], [place] [address]  Phone:  [phone]  Fax:  [phone]  DEA #:  [id number]

## 2022-04-25 NOTE — Telephone Encounter (Signed)
Rx sent to the pharmacy.

## 2022-05-02 ENCOUNTER — Ambulatory Visit: Payer: PPO | Attending: Cardiovascular Disease | Admitting: Pharmacist

## 2022-05-02 ENCOUNTER — Encounter: Payer: Self-pay | Admitting: Pharmacist

## 2022-05-02 VITALS — BP 157/80 | HR 76

## 2022-05-02 DIAGNOSIS — I10 Essential (primary) hypertension: Secondary | ICD-10-CM

## 2022-05-02 DIAGNOSIS — C189 Malignant neoplasm of colon, unspecified: Secondary | ICD-10-CM | POA: Insufficient documentation

## 2022-05-02 NOTE — Patient Instructions (Addendum)
It was nice seeing you again  We would like your blood pressure to be less than 130/80  Increase your losartan back to '100mg'$  a day.    Hold your hydralazine '25mg'$  tonight and all day tomorrow to see if the itching stops.  If you are still itchy, restart your hydralazine on Friday  Continue to check your blood pressure at home  We will see you back in 1 month  Karren Cobble, PharmD, [place], [place], [address], [place], [place], [address] Phone: [phone], Fax: [phone]

## 2022-05-02 NOTE — Progress Notes (Signed)
Patient ID: [name]                 DOB: 1940/10/04                      MRN: [id number]     HPI: [name] is a 81 y.o. female referred by Dr. Oval Linsey to HTN clinic. PMH is significant for HTN, Sjogrens Syndrome and CAD.  Has history of lymphoma and colon cancer.  Patient presents today by herself for HTN follow up. At last visit reported stress and depression since 1982 when her family had a tragedy. She copes by speaking with her friend frequently. Is also stressed regarding her niece who is mentally disabled and lives with her sister. She reports it is not an ideal situation and this is causing anxiety and she believes it is the root cause of her HTN.  At this visit she reports stress because she recently purchased a car and broke one of the side view mirrors.  At last visit with [name], hydralazine was added at '50mg'$  BID and losartan was reduced to '50mg'$  daily from '100mg'$ .  However, she did not feel comfortable taking hydralazine '50mg'$  twice daily so she has only been taking '25mg'$ .  Reports she could not tolerate amlodipine.  At first pharmD visit, losartan was increased to '100mg'$ .  However patient reduced dose back to '50mg'$  on 9/3 because she thought the dose was too strong.  Home BP readings:  9/1: 132/63 9/2: 132/78 9/3: 125/78 9/4: 150/76 9/5: 156/63 9/6: 146/73 9/7: 139/74  Reports itching across her chest and legs. Is not sure if this is due to the medication or due to her Sjogrens syndrome.  Has occasional sharp chest pain that lasts momentarily.   Current HTN meds:   Hydralazine '25mg'$  BID Losartan '100mg'$ , patient switched to '50mg'$   BP goal: <130/80  Wt Readings from Last 3 Encounters:  04/10/22 202 lb 9.6 oz (91.9 kg)  04/04/22 206 lb 3.2 oz (93.5 kg)  03/16/22 205 lb 6.4 oz (93.2 kg)   BP Readings from Last 3 Encounters:  05/02/22 (!) 157/80  04/10/22 (!) 125/48  04/04/22 (!) 173/80   Pulse Readings from Last 3 Encounters:  05/02/22 76  04/10/22 72   04/04/22 76    Renal function: CrCl cannot be calculated (Patient's most recent lab result is older than the maximum 21 days allowed.).  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   Cataract    bilateral small- removed both eyes    Colon cancer ([facility])    stage 1- at appendiceal orifice    Complication of anesthesia    has dry mouth anyway from shograns-sore throats post op   Coronary artery calcification seen on CAT scan 11/14/2019   Depression    since 1982    Diverticulosis    Fibromyalgia    GERD (gastroesophageal reflux disease)    past hx    Heart murmur    Hyperlipidemia    controlled - metabolic syndrome per pt- goes up and down    Hypertension    Lung infiltrate on CT 09/14/2015   Meningioma ([facility]) 11/2009   right post parietal 43m - stable 11/2009 MRI   Nephrolithiasis    Nephrolithiasis    Neuromuscular disorder ([facility])    fibromyalgia  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     augmented betamethasone dipropionate (DIPROLENE-AF) 0.05 % cream Apply 1 a small amount to affected area twice a day     Cholecalciferol (VITAMIN D3) 50 MCG (2000 UT) TABS Take 2 tablets by mouth daily.     clotrimazole-betamethasone (LOTRISONE) cream Apply topically 2 (two) times daily.     hydrALAZINE (APRESOLINE) 50 MG tablet Take 1 tablet (50 mg total) by mouth in the morning and at bedtime. 12 HOURS APART (Patient taking differently: Take 25 mg by mouth in the morning and at bedtime. 12 HOURS APART/ Take 25 mg BID) 180 tablet 1   losartan (COZAAR) 50 MG tablet Take 2 tablets (100 mg total)  by mouth daily. 90 tablet 3   PFIZER COVID-19 VAC BIVALENT injection      PREVNAR 20 0.5 ML injection      SHINGRIX injection      simvastatin (ZOCOR) 20 MG tablet Take 1 tablet (20 mg total) by mouth at bedtime. 90 tablet 3   No current facility-administered medications on file prior to visit.    Allergies  Allergen Reactions   Lisinopril Cough     Assessment/Plan:  1. Hypertension -    HYPERTENSION CONTROL Vitals:   05/02/22 0923 05/02/22 0924  BP: (!) 147/80 (!) 157/80    The patient's blood pressure is elevated above target today.  In order to address the patient's elevated BP: A current anti-hypertensive medication was adjusted today.    Patient BP in room 147/80 which is above goal of <130/80.  Blood pressure had been trending down towards goal however patient self tapered her losartan back to '50mg'$  because she thought '100mg'$  was too strong. BP then increased.  Encouraged her to restart losartan at '100mg'$  as recommended.   Unclear if her itching is due to medications or Sjogrens but will hold hydralazine tonight and tomorrow to see if itching decreases. If it does not, recommended to restart hydralazine on Friday 9/15.    Advised to continue to check BP and will follow up in 1 month. Continue hydralazine '25mg'$  BID Increase losartan to '100mg'$  daily Recheck in 4 weeks  Karren Cobble, PharmD, [place], [place], [place], [place], [place], [address] Phone: [phone], Fax: [phone]

## 2022-05-15 ENCOUNTER — Telehealth: Payer: Self-pay | Admitting: Cardiovascular Disease

## 2022-05-15 NOTE — Telephone Encounter (Signed)
Returned call to patient,   Patient states she got about 3 hours of sleep and when she woke up it was the first time since yesterday it was the first time that she hadn't noticed any palpitations. She states yesterday was a bit of a nightmare. She notes having palpitations through the day and into the night. BP today 148/68, yesterday 147/73 and 141/73. Patient notes that her heart felt like it was beating very hard and felt like it was racing. Patient states this has been happening on and off for a while.  Patient notes that she has been nauseated for 40 years and wondered if she should have mentioned to the [facility] at their last visit that a dew times in the last month she has tried to drink tea and it has sent her to the bathroom dry heaving. Advised patient to follow up on this with her primary care.    Patient currently taking Hydralazine '25mg'$  twice daily

## 2022-05-15 NOTE — Telephone Encounter (Signed)
Patient c/o Palpitations:  High priority if patient c/o lightheadedness, shortness of breath, or chest pain  How long have you had palpitations/irregular HR/ Afib? Are you having the symptoms now?  Heart was pounding all day yesterday, 9/25, aside from about 30 minutes where it was "quiet"--was going on until around 2:00 AM this morning and patient was unable to sleep. Still having symptoms currently, but not as bad as yesterday  Are you currently experiencing lightheadedness, SOB or CP?  No   Do you have a history of afib (atrial fibrillation) or irregular heart rhythm?  No   Have you checked your BP or HR? (document readings if available):  Systolic is in the 098'J Diastolic is ranging 19'J-47'W  Are you experiencing any other symptoms?  No

## 2022-05-15 NOTE — Telephone Encounter (Signed)
If the palpitations are still occurring she should go to ED - they can do EKG and determine what is happening.  Doubtful it is from hydralazine, she has been on that for several months

## 2022-05-16 NOTE — Telephone Encounter (Signed)
Spoke with patient regarding pounding/fast HR, unsure what HR was during that time.  She has not had any episodes since yesterday and felt better today  She did have a recent episode of chest pressure/heaviness recently while walking in store  Under a lot of stress  Scheduled appointment with Overton Mam NP for 10/3, patient aware of date time and location

## 2022-05-21 NOTE — Progress Notes (Unsigned)
Cardiology Office Note:    Date:  05/23/2022   ID:  [name], DOB 02-02-1941, MRN [id number]  PCP:  Tawnya Crook, MD   [facility] Cardiologist:  [name], MD     Referring MD: Tawnya Crook, MD   CC: "Thumping heart beat" x 1 month  History of Present Illness:    [name] is a 81 y.o. female with a hx of the following:  History of palpitations Hyperlipidemia Hypertension Coronary artery calcification Cystic, bullous disease of lung (Follows pulmonology) Obstructive lung disease, pulmonary nodule GERD Sjogren's syndrome Intracranial meningioma History of non-Hodgkin's lymphoma History of colon cancer Fibromyalgia Anxiety  History of significant stress since 11/16/80 when her son died by suicide.  Developed Sjogren's syndrome, has niece who is mentally challenged and has moved in with her sister, causing significant stress for her sister and herself. Hx of throbbing sensation in chest, intermittent, at rest and loss lasts approximately 20 minutes, denied shortness of breath.  Noted exertional dyspnea in the past, was unable to get exercise at that time.  In 11/17/2019, she reported symptoms concerning for angina, underwent cardiac catheterization in 11/17/2019 that revealed only minimal disease, 10% in mid LAD, LVEF 60 to 65%.  Slight murmur was noted on exam and echocardiogram revealed LVEF 70-75%, grade 1 DD, aortic valve sclerosis without stenosis.  She also reported SBP in 160s.  History of intolerance to antihypertensives.  She also reported chest pain that was occurring over the past year and noted pain and discomfort in RLE, she was losing sleep so therefore she was not exercising regularly. Follows pulmonology for history of diffuse cystic lung disease, secondary to Sjogren's syndrome.    She recently contacted our office c/o palpitations.  Presents today for evaluation. Admits to "thumping heartbeat" that started 4 weeks ago, friend  advised her to seek help, pt did not want to go to the ED.  Has been occurring intermittently mostly every day, has never happened before.  Affects ADLs, unable to read and interrupting sleep at night.  Says it was very noticeable last week on Monday, occurred in the AM and happened all day long.  States once in a while, notices a fast heartbeat, however its more "thumping" in nature according to her report, mostly occurs at night.  Shows me BP log, average SBP recently 140s to 150s.  States she has had "low BP" all her life, average SBP prior to this 120s.  Has an upcoming appointment with clinical pharmacist for HTN management, requesting to cancel this.  Does admit to stress due to history of niece who is mentally challenged and is living with 55 year old sister.  Does admit to "low-grade" sharp, left-sided chest pain, that is intermittent and rare in occurrence over the last 12 months, not particularly bothersome per her report.  Denies any shortness of breath, syncope, presyncope, dizziness, orthopnea, PND, bleeding, or claudication.  Adheres to the [place] and has been limiting caffeine.  Denies any other questions or concerns today.  Past Medical History:  Diagnosis Date   Anxiety    Arthritis    right thumb CMC arthritis   Asthmatic bronchitis Nov 17, 2015   Cancer (HCC)    -NHL R parotid   Cataract    bilateral small- removed both eyes    Colon cancer ([place])    stage 1- at appendiceal orifice    Complication of anesthesia    has dry mouth anyway from shograns-sore throats post op  Coronary artery calcification seen on CAT scan 11/14/2019   Depression    since 1982    Diverticulosis    Fibromyalgia    GERD (gastroesophageal reflux disease)    past hx    Heart murmur    Hyperlipidemia    controlled - metabolic syndrome per pt- goes up and down    Hypertension    Lung infiltrate on CT 09/14/2015   Meningioma ([place]) 11/2009   right post parietal 37m - stable 11/2009 MRI    Nephrolithiasis    Nephrolithiasis    Neuromuscular disorder ([facility]    fibromyalgia   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[facility] 08/31/2015   Pure hypercholesterolemia 11/14/2019   Sjogren's disease ([facility]     Past Surgical History:  Procedure Laterality Date   ABDOMINAL HYSTERECTOMY     APPENDECTOMY  2008   ARTERY BIOPSY  02/03/2013   Procedure: MINOR BIOPSY TEMPORAL ARTERY;  Surgeon: SAscencion Dike MD;  [facility]  Service: ENT;;   COLONOSCOPY     [place] AND CORONARY ANGIOGRAPHY N/A 12/01/2019   Procedure: LEFT HEART CATH AND CORONARY ANGIOGRAPHY;  Surgeon: KTroy Sine MD;  [facility];  Service: Cardiovascular;  Laterality: N/A;   MASS EXCISION  2008   abd-benign   PAROTIDECTOMY Right 08/01/2015   Procedure: RIGHT PAROTIDECTOMY;  Surgeon: SLeta Baptist MD;  [facility]  Service: ENT;  Laterality: Right;   PAROTIDECTOMY Left 04/04/2021   Procedure: LEFT PAROTIDECTOMY;  Surgeon: TLeta Baptist MD;  [facility]  Service: ENT;  Laterality: Left;   POLYPECTOMY     TONSILLECTOMY     VAGINAL HYSTERECTOMY  1989    Current Medications: Current Meds  Medication Sig   acetaminophen (TYLENOL) 500 MG tablet Take 500 mg by mouth every 6 (six) hours as needed for moderate pain.   augmented betamethasone dipropionate (DIPROLENE-AF) 0.05 % cream Apply 1 a small amount to affected area twice a day   Cholecalciferol (VITAMIN D3) 50 MCG (2000 UT) TABS Take 2 tablets by mouth daily.   clotrimazole-betamethasone (LOTRISONE) cream Apply topically 2 (two) times daily.   hydrALAZINE (APRESOLINE) 50 MG tablet Take 1 tablet (50 mg total) by mouth in the morning and at bedtime. 12 HOURS APART (Patient taking differently: Take 25 mg by mouth in the morning and at bedtime. 12 HOURS APART/ Take 25 mg BID)   PFIZER COVID-19 VAC BIVALENT injection    PREVNAR 20 0.5 ML injection     SHINGRIX injection    simvastatin (ZOCOR) 20 MG tablet Take 1 tablet (20 mg total) by mouth at bedtime.    losartan (COZAAR) 50 MG tablet Take 2 tablets (100 mg total) by mouth daily.     Allergies:   Lisinopril   Social History   Socioeconomic History   Marital status: Divorced    Spouse name: Not on file   Number of children: 2   Years of education: College   Highest education level: Not on file  Occupational History   Occupation: Retired    EFish farm manager UNEMPLOYED  Tobacco Use   Smoking status: Never   Smokeless tobacco: Never   Tobacco comments:    Passive exposure through her mother.  Vaping Use   Vaping Use: Never used  Substance and Sexual Activity   Alcohol use: Yes    Alcohol/week: 0.0 standard drinks of alcohol    Comment: Rarely  Drug use: No   Sexual activity: Not Currently    Comment: [place] sister.  Other Topics Concern   Not on file  Social History Narrative   Pt lives at home alone.   Caffeine Use: 2 mugs every other day.       [facility]:   Originally from [place], [place]. She has also lived in [place], [place], & [place]. Internationally she has been to [place], [place], [place], & [place]. Previously has worked in Press photographer. No chemical or fume exposures. No pets currently. No bird, mold, or hot tub exposure. 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    Social Determinants of Health   Financial Resource Strain: Low Risk  (03/15/2022)   Overall Financial Resource Strain (CARDIA)    Difficulty of Paying Living Expenses: Not hard at all  Food Insecurity: No Food Insecurity (03/15/2022)   Hunger Vital Sign    Worried About Running Out of Food in the Last Year: Never true    Ran Out of Food in the Last Year: Never true  Transportation Needs: No Transportation Needs (03/15/2022)   PRAPARE - Hydrologist (Medical): No    Lack of Transportation (Non-Medical): No   Physical Activity: Inactive (03/15/2022)   Exercise Vital Sign    Days of Exercise per Week: 0 days    Minutes of Exercise per Session: 0 min  Stress: Stress Concern Present (03/15/2022)   [place]    Feeling of Stress : To some extent  Social Connections: Socially Isolated (03/15/2022)   Social Connection and Isolation Panel [NHANES]    Frequency of Communication with Friends and Family: More than three times a week    Frequency of Social Gatherings with Friends and Family: More than three times a week    Attends Religious Services: Never    Marine scientist or Organizations: No    Attends Music therapist: Never    Marital Status: Divorced     Family History: The patient's family history includes Colon cancer in her father; Colon cancer (age of onset: 41) in her mother; Suicidality in her son. There is no history of Diabetes, Stomach cancer, Colon polyps, Esophageal cancer, or Rectal cancer.  ROS:   Review of Systems  Constitutional: Negative.   HENT: Negative.    Eyes: Negative.   Respiratory: Negative.    Cardiovascular:  Positive for chest pain and palpitations. Negative for orthopnea, claudication, leg swelling and PND.       See HPI.  Gastrointestinal: Negative.   Genitourinary: Negative.   Musculoskeletal:  Positive for joint pain.  Skin: Negative.   Neurological: Negative.   Endo/Heme/Allergies: Negative.   Psychiatric/Behavioral:  Negative for depression, hallucinations, memory loss, substance abuse and suicidal ideas. The patient is nervous/anxious. The patient does not have insomnia.    Please see the history of present illness.    All other systems reviewed and are negative.  EKGs/Labs/Other Studies Reviewed:    The following studies were reviewed today:  EKG:  EKG is not ordered today.   CT of the chest without contrast on March 05, 2022: 1. No acute intrathoracic  process. 2. Stable basilar predominant cystic lung disease, with numerous thin walled cysts unchanged since prior study. Changes related to connective tissue disease or pulmonary Langerhans cell histiocytosis again are leading diagnostic considerations. 3. Stable chronic scarring within the right middle lobe.  No acute airspace disease. 4.  Aortic Atherosclerosis (ICD10-I70.0). 5. Small hiatal hernia. 2D echocardiogram on December 08, 2019:  1. Left ventricular ejection fraction, by estimation, is 70 to 75%. The  left ventricle has hyperdynamic function. The left ventricle has no  regional wall motion abnormalities. Left ventricular diastolic parameters  are consistent with age-related delayed   relaxation (normal).   2. Right ventricular systolic function is normal. The right ventricular  size is normal. There is normal pulmonary artery systolic pressure. The  estimated right ventricular systolic pressure is 09.7 mmHg.   3. The mitral valve is grossly normal. Trivial mitral valve  regurgitation. No evidence of mitral stenosis.   4. The aortic valve is tricuspid. Aortic valve regurgitation is not  visualized. Mild aortic valve sclerosis is present, with no evidence of  aortic valve stenosis.   5. The inferior vena cava is normal in size with greater than 50%  respiratory variability, suggesting right atrial pressure of 3 mmHg.     Left heart cath and coronary angiography on December 01, 2019: Mid LAD lesion is 10% stenosed.   No significant coronary obstructive disease in this patient with very short left main that immediately bifurcates into a large LAD and a large dominant left circumflex coronary artery.  The midportion of the LAD dips slightly intramyocardially and has mild irregularity of 10% in this intramyocardial segment.  There was no evidence gross systolic muscle bridging.  The dominant circumflex coronary artery is angiographically normal as is the small nondominant RCA.    Normal LV function with EF estimate at 60 to 65%.  LVEDP 13 mmHg.   RECOMMENDATION: Medical therapy.  Echocardiogram stress test on Jan 04, 2012: Normal study after maximal exercise.  Bruce protocol.  Target heart rate was achieved.  Heart rate response to stress was normal.  Normal resting BP with inappropriate response to stress. Patient did not have any chest pain during stress.  Functional capacity was decreased.  No evidence for new LV regional wall motion abnormalities at peak stress.  Recent Labs: 08/24/2021: TSH 2.61 04/10/2022: ALT 12; BUN 12; Creatinine 0.62; Hemoglobin 11.9; Platelet Count 235; Potassium 4.3; Sodium 138  Recent Lipid Panel    Component Value Date/Time   CHOL 208 (H) 08/24/2021 0841   CHOL 195 01/28/2020 0911   TRIG 194.0 (H) 08/24/2021 0841   HDL 54.20 08/24/2021 0841   HDL 54 01/28/2020 0911   CHOLHDL 4 08/24/2021 0841   VLDL 38.8 08/24/2021 0841   LDLCALC 115 (H) 08/24/2021 0841   LDLCALC 117 (H) 01/28/2020 0911   LDLDIRECT 129.5 06/10/2013 0956     Risk Assessment/Calculations:     HYPERTENSION CONTROL Vitals:   05/22/22 1437 05/22/22 1450  BP: (!) 156/64 (!) 144/82    The patient's blood pressure is elevated above target today.  In order to address the patient's elevated BP: A current anti-hypertensive medication was adjusted today.; Blood pressure will be monitored at home to determine if medication changes need to be made.            Physical Exam:    VS:  BP (!) 144/82 (BP Location: Left Arm, Patient Position: Sitting, Cuff Size: Normal)   Pulse 95   Ht '5\' 9"'$  (1.753 m)   Wt 204 lb (92.5 kg)   BMI 30.13 kg/m     Wt Readings from Last 3 Encounters:  05/22/22 204 lb (92.5 kg)  04/10/22 202 lb 9.6 oz (91.9 kg)  04/04/22 206 lb 3.2 oz (93.5 kg)  Vitals:   05/22/22 1437 05/22/22 1450  BP: (!) 156/64 (!) 144/82  Pulse: 95      GEN: Well nourished, well developed in no acute distress HEENT: Normal NECK: No JVD; No carotid  bruits CARDIAC: S1/S2, RRR, Grade 3/6 systolic murmur noted along RSB with radiation to right carotid, no rubs or gallops noted.  2+ peripheral pulses throughout, strong and equal bilaterally RESPIRATORY:  Clear and diminished to auscultation without rales, wheezing or rhonchi  MUSCULOSKELETAL:  No edema; No deformity  SKIN: Warm and dry NEUROLOGIC:  Alert and oriented x 3 PSYCHIATRIC:  Normal affect   ASSESSMENT:    1. Palpitations   2. Chest pain of uncertain etiology   3. Coronary artery calcification   4. Mixed hyperlipidemia   5. Essential hypertension   6. Heart murmur   7. Cystic-bullous disease of lung    PLAN:    In order of problems listed above:  Palpitations  Has noticed thumping heartbeat x 1 month.  Etiology multifactorial.  Most likely suspect this may be due to AV dx or TR, as grade 3/6 systolic murmur noted RSB with radiation to right carotid.  Fatigue and hx of stress could also be adding to this. Will arrange 14-day monitor first, then proceed with 2D echocardiogram. Will arrange the following blood work to be drawn next week: CBC, CMET, TSH, and Magnesium.  Will route note to pulmonologist regarding metoprolol as needed.  Addendum 05/23/22: Consulted patient's pulmonologist, Dr. Loanne Drilling who agreed patient may take Metoprolol PRN for palpitations. Stated because she is asymptomatic with her lung dx, she is not currently on any treatment. Will initiate 25 mg of Metoprolol tartrate twice daily PRN for palpitations.   2.  Chest pain of uncertain etiology Her report of chest pain is rare in occurrence, not bothersome per her report, does not sound cardiac in nature.  History of chronic stress could also be adding to this.  She defers ischemic evaluation at this time and would rather proceed with monitor and echocardiogram. Continue to follow with PCP. ED precautions discussed.   3.  Coronary artery calcification No significant coronary obstructive disease, mid LAD lesion  was 10% stenosis as noted in left heart cath in 2021.  CP as mentioned above does not sound cardiac in nature.  Defers ischemic evaluation at this time.  ED precautions discussed.  Continue current medication regimen.  4.  Hyperlipidemia LDL in January 2023 was 115, total cholesterol 208, currently not at goal.  She is agreeable to recheck CMET and FLP next week.  Continue simvastatin.   5.  Hypertension Home BP readings not at goal.  BP on arrival, 156/64.  recheck 144/82.   SBP goal < 140.  Stop losartan and initiate valsartan 160 mg daily.  Given BP log and discussed to monitor BP at home at least 2 hours after medications and sitting for 5-10 minutes.  Low-salt diet encouraged.  For future, want to avoid ACE inhibitors d/t hx of cough with this. She is not sure about the previously documented history of diarrhea with beta-blocker, she says she is willing to try this again if needed.  Continue hydralazine.   6.  Heart murmur Grade 3/6 systolic murmur noted at RSB with radiation to right carotid.  I highly suspect this may be d/t aortic valve disease or TR.  Previous echo revealed calcification of aortic valve without any stenosis, trivial MR, otherwise normal. Will update 2D Echo at this time.    7.  Cystic  lung disease Secondary to hx of Sjogren's syndrome, denies worsening symptoms.  Last appointment went well with pulmonology, will follow-up within the next year.  Will route this note to her pulmonologist regarding BB, such as metoprolol PRN for palpitations.  Continue to follow-up with pulmonology.  8.  Disposition: Follow-up with me or [name], NP in 6 to 8 weeks or sooner if anything changes.   Medication Adjustments/Labs and Tests Ordered: Current medicines are reviewed at length with the patient today.  Concerns regarding medicines are outlined above.  Orders Placed This Encounter  Procedures   CBC   Comprehensive metabolic panel   Lipid panel   Magnesium   TSH   LONG TERM  MONITOR (3-14 DAYS)   ECHOCARDIOGRAM COMPLETE   Meds ordered this encounter  Medications   valsartan (DIOVAN) 160 MG tablet    Sig: Take 1 tablet (160 mg total) by mouth daily.    Dispense:  90 tablet    Refill:  3    Patient Instructions  Medication Instructions:  Your physician has recommended you make the following change in your medication:   Stop: Losartan   Start: Valsartan '160mg'$  daily   *If you need a refill on your cardiac medications before your next appointment, please call your pharmacy*   Lab Work: Please return for Lab work in one week for Fasting Lipid Panel, CMP, TSH, Mag, CBC. You may come to the...   [facility] (3rd floor) [address], [place], [place] [address]  Open: 8am-Noon and 1pm-4:30pm  Please ring the doorbell on the small table when you exit the elevator and the Lab Tech will come get you  [facility] [address] [place], [place], [place] [address] Open: 8am-1pm, then 2pm-4:30pm   [facility]- Please see attached locations sheet stapled to your lab work with address and hours.   If you have labs (blood work) drawn today and your tests are completely normal, you will receive your results only by: [facility] (if you have MyChart) OR A paper copy in the mail If you have any lab test that is abnormal or we need to change your treatment, we will call you to review the results.   Testing/Procedures: Your physician has requested that you have an echocardiogram after 10/18. Echocardiography is a painless test that uses sound waves to create images of your heart. It provides your doctor with information about the size and shape of your heart and how well your heart's chambers and valves are working. This procedure takes approximately one hour. There are no restrictions for this procedure. [facility] has recommended that you wear a Zio monitor.   This  monitor is a medical device that records the heart's electrical activity. Doctors most often use these monitors to diagnose arrhythmias. Arrhythmias are problems with the speed or rhythm of the heartbeat. The monitor is a small device applied to your chest. You can wear one while you do your normal daily activities. While wearing this monitor if you have any symptoms to push the button and record what you felt. Once you have worn this monitor for the period of time provider prescribed (Usually 14 days), you will return the monitor device in the postage paid box. Once it is returned they will download the data collected and provide [place] with a report which the provider will then review and we will call you with those results. Important tips:  Avoid showering during the first 24 hours of wearing the monitor. Avoid excessive sweating to help maximize wear time. Do not submerge the device, no hot tubs, and no swimming pools. Keep any lotions or oils away from the patch. After 24 hours you may shower with the patch on. Take brief showers with your back facing the shower head.  Do not remove patch once it has been placed because that will interrupt data and decrease adhesive wear time. Push the button when you have any symptoms and write down what you were feeling. Once you have completed wearing your monitor, remove and place into box which has postage paid and place in your outgoing mailbox.  If for some reason you have misplaced your box then call our office and we can provide another box and/or mail it off for you.   Follow-Up: At [facility], you and your health needs are our priority.  As part of our continuing mission to provide you with exceptional heart care, we have created designated Provider Care Teams.  These Care Teams include your primary Cardiologist (physician) and Advanced Practice Providers (APPs -  Physician Assistants and Nurse Practitioners) who all work together to provide you  with the care you need, when you need it.  We recommend signing up for the patient portal called "MyChart".  Sign up information is provided on this After Visit Summary.  MyChart is used to connect with patients for Virtual Visits (Telemedicine).  Patients are able to view lab/test results, encounter notes, upcoming appointments, etc.  Non-urgent messages can be sent to your provider as well.   To learn more about what you can do with MyChart, go to [email or web address].    Your next appointment:   Follow up in 6-8 weeks with [name], NP or [name], NP  Other Instructions Heart Healthy Diet Recommendations: A low-salt diet is recommended. Meats should be grilled, baked, or boiled. Avoid fried foods. Focus on lean protein sources like fish or chicken with vegetables and fruits. The American Heart Association is a Microbiologist!  American Heart Association Diet and Lifeystyle Recommendations   Exercise recommendations: The American Heart Association recommends 150 minutes of moderate intensity exercise weekly. Try 30 minutes of moderate intensity exercise 4-5 times per week. This could include walking, jogging, or swimming.   Important Information About Sugar         Signed, [name], NP  05/23/2022 12:05 PM    [place]

## 2022-05-22 ENCOUNTER — Other Ambulatory Visit (INDEPENDENT_AMBULATORY_CARE_PROVIDER_SITE_OTHER): Payer: PPO

## 2022-05-22 ENCOUNTER — Ambulatory Visit (HOSPITAL_BASED_OUTPATIENT_CLINIC_OR_DEPARTMENT_OTHER): Payer: PPO | Admitting: Nurse Practitioner

## 2022-05-22 ENCOUNTER — Encounter (HOSPITAL_BASED_OUTPATIENT_CLINIC_OR_DEPARTMENT_OTHER): Payer: Self-pay | Admitting: Nurse Practitioner

## 2022-05-22 VITALS — BP 144/82 | HR 95 | Ht 69.0 in | Wt 204.0 lb

## 2022-05-22 DIAGNOSIS — R011 Cardiac murmur, unspecified: Secondary | ICD-10-CM

## 2022-05-22 DIAGNOSIS — E782 Mixed hyperlipidemia: Secondary | ICD-10-CM

## 2022-05-22 DIAGNOSIS — I10 Essential (primary) hypertension: Secondary | ICD-10-CM

## 2022-05-22 DIAGNOSIS — I2510 Atherosclerotic heart disease of native coronary artery without angina pectoris: Secondary | ICD-10-CM | POA: Diagnosis not present

## 2022-05-22 DIAGNOSIS — R079 Chest pain, unspecified: Secondary | ICD-10-CM | POA: Diagnosis not present

## 2022-05-22 DIAGNOSIS — J984 Other disorders of lung: Secondary | ICD-10-CM

## 2022-05-22 DIAGNOSIS — R002 Palpitations: Secondary | ICD-10-CM

## 2022-05-22 DIAGNOSIS — I2584 Coronary atherosclerosis due to calcified coronary lesion: Secondary | ICD-10-CM

## 2022-05-22 MED ORDER — VALSARTAN 160 MG PO TABS: 160.0000 mg | ORAL_TABLET | Freq: Every day | ORAL | 3 refills | Status: DC

## 2022-05-22 NOTE — Patient Instructions (Signed)
Medication Instructions:  Your physician has recommended you make the following change in your medication:   Stop: Losartan   Start: Valsartan '160mg'$  daily   *If you need a refill on your cardiac medications before your next appointment, please call your pharmacy*   Lab Work: Please return for Lab work in one week for Fasting Lipid Panel, CMP, TSH, Mag, CBC. You may come to the...   [facility] (3rd floor) [address], [place], [place] [address]  Open: 8am-Noon and 1pm-4:30pm  Please ring the doorbell on the small table when you exit the elevator and the Lab Tech will come get you  [name] [facility] [address] [place], [place], [place] [address] Open: 8am-1pm, then 2pm-4:30pm   [name]- Please see attached locations sheet stapled to your lab work with address and hours.   If you have labs (blood work) drawn today and your tests are completely normal, you will receive your results only by: [name] (if you have MyChart) OR A paper copy in the mail If you have any lab test that is abnormal or we need to change your treatment, we will call you to review the results.   Testing/Procedures: Your physician has requested that you have an echocardiogram after 10/18. Echocardiography is a painless test that uses sound waves to create images of your heart. It provides your doctor with information about the size and shape of your heart and how well your heart's chambers and valves are working. This procedure takes approximately one hour. There are no restrictions for this procedure. [name] has recommended that you wear a Zio monitor.   This monitor is a medical device that records the heart's electrical activity. Doctors most often use these monitors to diagnose arrhythmias. Arrhythmias are problems with the speed or rhythm of the heartbeat. The monitor is a small device applied to your  chest. You can wear one while you do your normal daily activities. While wearing this monitor if you have any symptoms to push the button and record what you felt. Once you have worn this monitor for the period of time provider prescribed (Usually 14 days), you will return the monitor device in the postage paid box. Once it is returned they will download the data collected and provide [place] with a report which the provider will then review and we will call you with those results. Important tips:  Avoid showering during the first 24 hours of wearing the monitor. Avoid excessive sweating to help maximize wear time. Do not submerge the device, no hot tubs, and no swimming pools. Keep any lotions or oils away from the patch. After 24 hours you may shower with the patch on. Take brief showers with your back facing the shower head.  Do not remove patch once it has been placed because that will interrupt data and decrease adhesive wear time. Push the button when you have any symptoms and write down what you were feeling. Once you have completed wearing your monitor, remove and place into box which has postage paid and place in your outgoing mailbox.  If for some reason you have misplaced your box then call our office and we can provide another box and/or mail it off for you.   Follow-Up: At [facility], you and your health needs are our priority.  As part of our continuing mission to provide you with exceptional heart care, we have created  designated Provider Care Teams.  These Care Teams include your primary Cardiologist (physician) and Advanced Practice Providers (APPs -  Physician Assistants and Nurse Practitioners) who all work together to provide you with the care you need, when you need it.  We recommend signing up for the patient portal called "MyChart".  Sign up information is provided on this After Visit Summary.  MyChart is used to connect with patients for Virtual Visits (Telemedicine).   Patients are able to view lab/test results, encounter notes, upcoming appointments, etc.  Non-urgent messages can be sent to your provider as well.   To learn more about what you can do with MyChart, go to [email or web address].    Your next appointment:   Follow up in 6-8 weeks with Jillian Bud, NP or Jillian Montana, NP  Other Instructions Heart Healthy Diet Recommendations: A low-salt diet is recommended. Meats should be grilled, baked, or boiled. Avoid fried foods. Focus on lean protein sources like fish or chicken with vegetables and fruits. The American Heart Association is a Microbiologist!  American Heart Association Diet and Lifeystyle Recommendations   Exercise recommendations: The American Heart Association recommends 150 minutes of moderate intensity exercise weekly. Try 30 minutes of moderate intensity exercise 4-5 times per week. This could include walking, jogging, or swimming.   Important Information About Sugar

## 2022-05-23 ENCOUNTER — Other Ambulatory Visit (HOSPITAL_BASED_OUTPATIENT_CLINIC_OR_DEPARTMENT_OTHER): Payer: Self-pay | Admitting: Nurse Practitioner

## 2022-05-23 DIAGNOSIS — R002 Palpitations: Secondary | ICD-10-CM

## 2022-05-23 MED ORDER — METOPROLOL TARTRATE 25 MG PO TABS: 25.0000 mg | ORAL_TABLET | Freq: Two times a day (BID) | ORAL | 0 refills | Status: DC | PRN

## 2022-05-23 NOTE — Addendum Note (Signed)
Addended by: [name] on: 05/23/2022 07:57 PM   Modules accepted: Orders

## 2022-05-24 ENCOUNTER — Telehealth (HOSPITAL_BASED_OUTPATIENT_CLINIC_OR_DEPARTMENT_OTHER): Payer: Self-pay

## 2022-05-24 NOTE — Telephone Encounter (Signed)
Attempted to return call to patient, no answer, unable to leave message      ----- Message from [name], NP sent at 05/23/2022  7:57 PM EDT ----- Regarding: Metoprolol Tartrate Please call and inform Ms. Hughley that I heard back from her lung doctor and I have sent a Rx for Lopressor 25 mg BID PRN for palpitations (thumping heartbeat) to her pharmacy. She does not have MyChart activated. I would send her a MyChart message if she had this.    Thanks!    [name], NP

## 2022-05-24 NOTE — Telephone Encounter (Signed)
Pt is returning call and is requesting return call.  

## 2022-05-24 NOTE — Telephone Encounter (Addendum)
Call attempt, no answer, unable to leave message   ----- Message from [name], NP sent at 05/23/2022  7:57 PM EDT ----- Regarding: Metoprolol Tartrate Please call and inform Ms. Bruss that I heard back from her lung doctor and I have sent a Rx for Lopressor 25 mg BID PRN for palpitations (thumping heartbeat) to her pharmacy. She does not have MyChart activated. I would send her a MyChart message if she had this.   Thanks!   [name], NP

## 2022-05-25 ENCOUNTER — Encounter (HOSPITAL_BASED_OUTPATIENT_CLINIC_OR_DEPARTMENT_OTHER): Payer: Self-pay

## 2022-05-25 NOTE — Telephone Encounter (Signed)
3rd call attempt, number will not connect, unable to leave a message, letter mailed to patient

## 2022-05-29 ENCOUNTER — Telehealth (HOSPITAL_BASED_OUTPATIENT_CLINIC_OR_DEPARTMENT_OTHER): Payer: Self-pay

## 2022-05-29 DIAGNOSIS — I10 Essential (primary) hypertension: Secondary | ICD-10-CM | POA: Diagnosis not present

## 2022-05-29 DIAGNOSIS — R011 Cardiac murmur, unspecified: Secondary | ICD-10-CM | POA: Diagnosis not present

## 2022-05-29 DIAGNOSIS — I2510 Atherosclerotic heart disease of native coronary artery without angina pectoris: Secondary | ICD-10-CM | POA: Diagnosis not present

## 2022-05-29 DIAGNOSIS — E782 Mixed hyperlipidemia: Secondary | ICD-10-CM | POA: Diagnosis not present

## 2022-05-29 DIAGNOSIS — I2584 Coronary atherosclerosis due to calcified coronary lesion: Secondary | ICD-10-CM | POA: Diagnosis not present

## 2022-05-29 DIAGNOSIS — R002 Palpitations: Secondary | ICD-10-CM | POA: Diagnosis not present

## 2022-05-29 NOTE — Telephone Encounter (Signed)
Pt presented to the office upset stating she called the office on 10/5 to report her monitor was falling off but did not receive a call back (no encounter found). Pt also stated she attempted to contact the monitor company, but was unable to understand due to the rep's accent. Pt stated she has since taped monitor down but it comes off daily.   Nurse will forward to monitor department

## 2022-05-30 ENCOUNTER — Telehealth (HOSPITAL_BASED_OUTPATIENT_CLINIC_OR_DEPARTMENT_OTHER): Payer: Self-pay

## 2022-05-30 LAB — MAGNESIUM: Magnesium: 2.2 mg/dL (ref 1.6–2.3)

## 2022-05-30 LAB — LIPID PANEL
Chol/HDL Ratio: 3.6 ratio (ref 0.0–4.4)
Cholesterol, Total: 171 mg/dL (ref 100–199)
HDL: 48 mg/dL (ref >39)
LDL Chol Calc (NIH): 96 mg/dL (ref 0–99)
Triglycerides: 154 mg/dL — ABNORMAL HIGH (ref 0–149)
VLDL Cholesterol Cal: 27 mg/dL (ref 5–40)

## 2022-05-30 LAB — CBC
Hematocrit: 36.4 % (ref 34.0–46.6)
Hemoglobin: 12.1 g/dL (ref 11.1–15.9)
MCH: 29.7 pg (ref 26.6–33.0)
MCHC: 33.2 g/dL (ref 31.5–35.7)
MCV: 89 fL (ref 79–97)
Platelets: 249 10*3/uL (ref 150–450)
RBC: 4.08 x10E6/uL (ref 3.77–5.28)
RDW: 12 % (ref 11.7–15.4)
WBC: 7.2 10*3/uL (ref 3.4–10.8)

## 2022-05-30 LAB — COMPREHENSIVE METABOLIC PANEL
ALT: 14 IU/L (ref 0–32)
AST: 15 IU/L (ref 0–40)
Albumin/Globulin Ratio: 1.8 (ref 1.2–2.2)
Albumin: 4.4 g/dL (ref 3.7–4.7)
Alkaline Phosphatase: 103 IU/L (ref 44–121)
BUN/Creatinine Ratio: 15 (ref 12–28)
BUN: 11 mg/dL (ref 8–27)
Bilirubin Total: 0.3 mg/dL (ref 0.0–1.2)
CO2: 21 mmol/L (ref 20–29)
Calcium: 9.5 mg/dL (ref 8.7–10.3)
Chloride: 103 mmol/L (ref 96–106)
Creatinine, Ser: 0.71 mg/dL (ref 0.57–1.00)
Globulin, Total: 2.5 g/dL (ref 1.5–4.5)
Glucose: 103 mg/dL — ABNORMAL HIGH (ref 70–99)
Potassium: 4.6 mmol/L (ref 3.5–5.2)
Sodium: 141 mmol/L (ref 134–144)
Total Protein: 6.9 g/dL (ref 6.0–8.5)
eGFR: 85 mL/min/{1.73_m2} (ref >59)

## 2022-05-30 LAB — TSH: TSH: 3.25 u[IU]/mL (ref 0.450–4.500)

## 2022-05-30 NOTE — Telephone Encounter (Addendum)
Attempted to contact patient,upon patient answering the phone she hung up, attempted to call back with call going straight to voicemail.    ----- Message from [name], NP sent at 05/30/2022  1:09 PM EDT ----- Please update [name] regarding blood work results. Overall stable. Triglycerides are elevated, but are trending down. Would like to see this less than 150. She needs to eliminate saturated fats/ processed foods in her diet. Is she agreeable to begin Fenofibrate 160 mg daily? This medication would help bring them down. If she is agreeable, let's initiate this.   Thanks!   [name], AGNP-C

## 2022-05-30 NOTE — Telephone Encounter (Signed)
Call attempt no answer, unable to leave message

## 2022-05-31 NOTE — Telephone Encounter (Signed)
Left message for patient to call back       Call patient about Having issues with heart monitor    [name], NP  P Cv Div Dwb Triage Please update [name] regarding blood work results. Overall stable. Triglycerides are elevated, but are trending down. Would like to see this less than 150. She needs to eliminate saturated fats/ processed foods in her diet. Is she agreeable to begin Fenofibrate 160 mg daily? This medication would help bring them down. If she is agreeable, let's initiate this.   Thanks!   [name], AGNP-C

## 2022-05-31 NOTE — Telephone Encounter (Signed)
Call attempt, no answer, unable to leave a message due to busy dial tone     Call patient about Having issues with heart monitor      [name], NP  P Cv Div Dwb Triage Please update Ms. Gleghorn regarding blood work results. Overall stable. Triglycerides are elevated, but are trending down. Would like to see this less than 150. She needs to eliminate saturated fats/ processed foods in her diet. Is she agreeable to begin Fenofibrate 160 mg daily? This medication would help bring them down. If she is agreeable, let's initiate this.   Thanks!   [name], AGNP-C

## 2022-06-01 ENCOUNTER — Ambulatory Visit: Payer: PPO

## 2022-06-01 MED ORDER — FENOFIBRATE 160 MG PO TABS: 160.0000 mg | ORAL_TABLET | Freq: Every day | ORAL | 3 refills | Status: DC

## 2022-06-01 NOTE — Telephone Encounter (Signed)
Patient returned call to office. Patient states that she got a call from her pharmacy stating that her medication has been sitting there since the 5th. Patient is frustrated that she claims she is not getting calls back. RN attempted to explain to patient that we have attempted to contact her multiple times but have been getting a busy tone and could not connect through. Also explained to patient that I called yesterday and was hung up on and tried to call back immediately and no one answered. Patient on multiple occasions interrupted and talked over RN. When asked if she would like to know the results and messages from [name], NP, RN was told that patient states she has never been treated this way or had a doctors office take so long to call her back. Attempted to review multiple encounters with patient with multiple interruptions, patient claims there is no way that her phone does not work. Patient states she is mad because she felt like she was unable to leave her home due to waiting for a phone call from our office. Patient is not happy that she is on so many medications and does not understand why she needs so many. RN attempted to explain what her medications are for. Patient said ok.  Patient also expressed frustration that she was told by the pharmacy that an MD sent her prescription in but she saw an NP. Attempted to explain to patient that the pharmacy probably just did not look at who sent the prescription to the pharmacy. Attempted to reassure patient that the prescription in questions was Metoprolol '25mg'$  BID to be taken as needed for palpitations. Patient states it is unacceptable that she did not know about this for so long, again attempted to explain that we had been trying to contact the patient. Then reviewed triglyceride results and patient states she will go get fenofibrate '160mg'$  from the pharmacy. Attempted to ask patient about her monitor troubles, patient only states that she will mail  it back on Tuesday. RN could not get further information on her monitor troubles, unsure if it has been accurately been collecting information. Attempted one final time to review all recent encounters that we had attempted to reach patient regarding and patient said ok.   RN sent Fenofibrate '160mg'$  to pharmacy.   Routing phone call to [name], NP as Juluis Rainier

## 2022-06-01 NOTE — Addendum Note (Signed)
Addended by: Gerald Stabs on: 06/01/2022 10:34 AM   Modules accepted: Orders

## 2022-06-04 ENCOUNTER — Encounter: Payer: Self-pay | Admitting: Family Medicine

## 2022-06-04 ENCOUNTER — Ambulatory Visit (INDEPENDENT_AMBULATORY_CARE_PROVIDER_SITE_OTHER): Payer: PPO | Admitting: Family Medicine

## 2022-06-04 ENCOUNTER — Telehealth: Payer: Self-pay | Admitting: Family Medicine

## 2022-06-04 VITALS — BP 138/62 | HR 85 | Temp 97.2°F | Ht 69.0 in | Wt 202.0 lb

## 2022-06-04 DIAGNOSIS — R002 Palpitations: Secondary | ICD-10-CM

## 2022-06-04 DIAGNOSIS — I10 Essential (primary) hypertension: Secondary | ICD-10-CM | POA: Diagnosis not present

## 2022-06-04 DIAGNOSIS — E7800 Pure hypercholesterolemia, unspecified: Secondary | ICD-10-CM

## 2022-06-04 DIAGNOSIS — R7303 Prediabetes: Secondary | ICD-10-CM | POA: Diagnosis not present

## 2022-06-04 NOTE — Telephone Encounter (Signed)
FYI

## 2022-06-04 NOTE — Progress Notes (Signed)
Subjective:     Patient ID: [name], female    DOB: February 12, 1941, 81 y.o.   MRN: [id number]  Chief Complaint  Patient presents with   Follow-up    6 month follow HTN    HPI HTN- Pt is on hydralazine '25mg'$  bid, metoprolol '25mg'$  bid(hasn't started) and valsartan '160mg'$  but still had losartan so not started the valsartan .  Bp's running 130-140's.  Occ 150-160.  No ha/dizziness/cp/edema/cough/sob. Palp more at hs.  Hasn't taken metoprolol yet. On Oct 12, pharm called to pick up rx from 10/5   metoprolol and fenofibrate-from Card.  Pt upset about all the meds and the fact that she wasn't told about the addn'l meds.   HLD-on simvastatin '20mg'$  and then card started the fenofibrate-pt working on diet so not taking the fenofibrate.  Not wanting to take it. Labs done 10/10 and was not on it.  "I'm 81 years old so not want to add more cholesterol meds" PreDM-working on diet for years. Upset w/someone who told her she had dm  Health Maintenance Due  Topic Date Due   FOOT EXAM  Never done   OPHTHALMOLOGY EXAM  Never done   Diabetic kidney evaluation - Urine ACR  Never done   Zoster Vaccines- Shingrix (2 of 2) 08/09/2020   COVID-19 Vaccine (6 - Pfizer risk series) 06/30/2021   HEMOGLOBIN A1C  02/21/2022   INFLUENZA VACCINE  03/20/2022    Past Medical History:  Diagnosis Date   Anxiety    Arthritis    right thumb CMC arthritis   Asthmatic bronchitis 2017   Cancer ([place])    -NHL R parotid   Cataract    bilateral small- removed both eyes    Colon cancer ([place])    stage 1- at appendiceal orifice    Complication of anesthesia    has dry mouth anyway from shograns-sore throats post op   Coronary artery calcification seen on CAT scan 11/14/2019   Depression    since 1982    Diverticulosis    Fibromyalgia    GERD (gastroesophageal reflux disease)    past hx    Heart murmur    Hyperlipidemia    controlled - metabolic syndrome per pt- goes up and down    Hypertension    Lung  infiltrate on CT 09/14/2015   Meningioma ([place]) 11/2009   right post parietal 35m - stable 11/2009 MRI   Nephrolithiasis    Nephrolithiasis    Neuromuscular disorder ([place]    fibromyalgia   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   Pure hypercholesterolemia 11/14/2019   Sjogren's disease ([place]     Past Surgical History:  Procedure Laterality Date   ABDOMINAL HYSTERECTOMY     APPENDECTOMY  2008   ARTERY BIOPSY  02/03/2013   Procedure: MINOR BIOPSY TEMPORAL ARTERY;  Surgeon: SAscencion Dike MD;  [facility]  Service: ENT;;   COLONOSCOPY     [place]ON[place] N/A 12/01/2019   Procedure: LEFT HEART CATH AND CORONARY ANGIOGRAPHY;  Surgeon: KTroy Sine MD;  [facility];  Service: Cardiovascular;  Laterality: N/A;   MASS EXCISION  2008   abd-benign   PAROTIDECTOMY Right 08/01/2015   Procedure: RIGHT PAROTIDECTOMY;  Surgeon: SLeta Baptist MD;  [facility]  Service: ENT;  Laterality: Right;   PAROTIDECTOMY Left 04/04/2021   Procedure: LEFT PAROTIDECTOMY;  Surgeon: TBenjamine Mola  Su, MD;  Location: [facility];  Service: ENT;  Laterality: Left;   [place]    Outpatient Medications Prior to Visit  Medication Sig Dispense Refill   acetaminophen (TYLENOL) 500 MG tablet Take 500 mg by mouth every 6 (six) hours as needed for moderate pain.     augmented betamethasone dipropionate (DIPROLENE-AF) 0.05 % cream Apply 1 a small amount to affected area twice a day     Cholecalciferol (VITAMIN D3) 50 MCG (2000 UT) TABS Take 2 tablets by mouth daily.     clotrimazole-betamethasone (LOTRISONE) cream Apply topically 2 (two) times daily.     fenofibrate 160 MG tablet Take 1 tablet (160 mg total) by mouth daily. 90 tablet 3   hydrALAZINE (APRESOLINE) 50 MG tablet Take 1 tablet (50 mg total) by mouth in the morning and  at bedtime. 12 HOURS APART (Patient taking differently: Take 25 mg by mouth in the morning and at bedtime. 12 HOURS APART/ Take 25 mg BID) 180 tablet 1   metoprolol tartrate (LOPRESSOR) 25 MG tablet TAKE 1 TABLET(25 MG) BY MOUTH TWICE DAILY AS NEEDED FOR PALPITATIONS 180 tablet 3   PFIZER COVID-19 VAC BIVALENT injection      PREVNAR 20 0.5 ML injection      SHINGRIX injection      simvastatin (ZOCOR) 20 MG tablet Take 1 tablet (20 mg total) by mouth at bedtime. 90 tablet 3   valsartan (DIOVAN) 160 MG tablet Take 1 tablet (160 mg total) by mouth daily. 90 tablet 3   No facility-administered medications prior to visit.    Allergies  Allergen Reactions   Lisinopril Cough   ROS neg/noncontributory except as noted HPI/below Palp better.  Having echo on 10/20 In sept-2 episodes of dry heaves.  Once was while drinking tea.  Not getting colonoscopy as "over 80"-pt w/h/o preca polyps.  Getting concerned and both parents died from colon ca.       Objective:     BP 138/62 (BP Location: Left Arm, Patient Position: Sitting, Cuff Size: Normal)   Pulse 85   Temp (!) 97.2 F (36.2 C) (Temporal)   Ht '5\' 9"'$  (1.753 m)   Wt 202 lb (91.6 kg)   SpO2 94%   BMI 29.83 kg/m  Wt Readings from Last 3 Encounters:  06/04/22 202 lb (91.6 kg)  05/22/22 204 lb (92.5 kg)  04/10/22 202 lb 9.6 oz (91.9 kg)    Physical Exam - pt checked her cuff it was 158/?  And then I recked-151/80  Gen: WDWN NAD HEENT: NCAT, conjunctiva not injected, sclera nonicteric NECK:  supple, no thyromegaly, no nodes, no carotid bruits CARDIAC: RRR, S1S2+, 2/6 murmur. DP 2+B. Wearing event monitor.  LUNGS: CTAB. No wheezes ABDOMEN:  BS+, soft, NTND, No HSM, no masses EXT:  no edema MSK: no gross abnormalities.  NEURO: A&O x3.  CN II-XII intact.  PSYCH: normal mood. Good eye contact  Spent 15 minutes with patient reviewing records from cardiology, labs, rationale for medications.  Also spent 30 minutes with patient listening  to other complaints, rechecking blood pressures and her blood pressure cuff, discussing risks versus benefits.  Devising a plan.  Total of 45 minutes with the patient.    Assessment & Plan:   Problem List Items Addressed This Visit       Cardiovascular and Mediastinum   Essential hypertension - Primary     Other   Palpitations  Pure hypercholesterolemia   Prediabetes  1.  Hypertension-chronic.  Well-controlled.  Reviewed labs from 05/29/2022.  Continue hydralazine 50 mg twice daily, losartan 100 mg daily (cardiology changed to valsartan 160 mg daily, however patient would like to finish losartan and discuss with cardiology why she was changed) she has an appoint with cardiology in November.  Continue to monitor blood pressures.  She needs to change the batteries in her cuff as it was reading high today.  Also, consider getting a new cuff.  Follow-up in 6 months 2.  Hyperlipidemia-chronic.  LDL controlled on simvastatin 20 mg.  Continue.  Triglycerides 154.  She is very upset about the prescription for fenofibrate.  She really does not want to take it.  Advised to continue diet/exercise.  She has good decision-making capacity, so I advised it is her decision whether she wants to take it or not. 3.  Prediabetes-chronic.  Controlled.  Per patient, not diagnosed with diabetes in the past.  Her A1c was 6.5 about 10 years ago.  She has been working on diet for many years.  Most recent A1c's have been in the 6.3 range.  This did not get checked from her labs on 10/10.  We discussed that this is still prediabetes.  Follow-up in 6 months 4.  Palpitations-chronic.  Improved.  She was given metoprolol at her last appointment with cardiology to be used as needed.  So far, things have improved and she has not used it.  Advised to keep on hand.  She currently has an event monitor in place.  She is getting an echocardiogram soon.  Follow-up with cardiology.  No orders of the defined types were placed in this  encounter.   Wellington Hampshire, MD

## 2022-06-04 NOTE — Telephone Encounter (Signed)
Patient states: -She wanted PCP to know she double checked and does indeed have the valsartan 160 mg in her possession.

## 2022-06-04 NOTE — Patient Instructions (Signed)
It was very nice to see you today!  Change batteries in the bp cuff or get new one.    Don't take the fenofibrate Cardiologist  wanted to change losartan to valsartan - either check w/pharmacy or stay on losartan till see Card.     PLEASE NOTE:  If you had any lab tests please let us know if you have not heard back within a few days. You may see your results on MyChart before we have a chance to review them but we will give you a call once they are reviewed by [place]. If we ordered any referrals today, please let us know if you have not heard from their office within the next week.   Please try these tips to maintain a healthy lifestyle:  Eat most of your calories during the day when you are active. Eliminate processed foods including packaged sweets (pies, cakes, cookies), reduce intake of potatoes, white bread, white pasta, and white rice. Look for whole grain options, oat flour or almond flour.  Each meal should contain half fruits/vegetables, one quarter protein, and one quarter carbs (no bigger than a computer mouse).  Cut down on sweet beverages. This includes juice, soda, and sweet tea. Also watch fruit intake, though this is a healthier sweet option, it still contains natural sugar! Limit to 3 servings daily.  Drink at least 1 glass of water with each meal and aim for at least 8 glasses per day  Exercise at least 150 minutes every week.

## 2022-06-08 ENCOUNTER — Ambulatory Visit (INDEPENDENT_AMBULATORY_CARE_PROVIDER_SITE_OTHER): Payer: PPO

## 2022-06-08 DIAGNOSIS — I2510 Atherosclerotic heart disease of native coronary artery without angina pectoris: Secondary | ICD-10-CM

## 2022-06-08 DIAGNOSIS — I10 Essential (primary) hypertension: Secondary | ICD-10-CM

## 2022-06-08 DIAGNOSIS — R011 Cardiac murmur, unspecified: Secondary | ICD-10-CM | POA: Diagnosis not present

## 2022-06-08 DIAGNOSIS — E782 Mixed hyperlipidemia: Secondary | ICD-10-CM | POA: Diagnosis not present

## 2022-06-08 DIAGNOSIS — R002 Palpitations: Secondary | ICD-10-CM

## 2022-06-08 DIAGNOSIS — I2584 Coronary atherosclerosis due to calcified coronary lesion: Secondary | ICD-10-CM

## 2022-06-09 LAB — ECHOCARDIOGRAM COMPLETE
AR max vel: 2.47 cm2
AV Area VTI: 2.21 cm2
AV Area mean vel: 2.12 cm2
AV Mean grad: 6 mmHg
AV Peak grad: 9.9 mmHg
Ao pk vel: 1.57 m/s
Area-P 1/2: 2.72 cm2
S' Lateral: 2.54 cm
Single Plane A4C EF: 73.2 %

## 2022-06-13 ENCOUNTER — Telehealth: Payer: Self-pay

## 2022-06-13 NOTE — Telephone Encounter (Addendum)
Left voice message for patient to give office a call for Echo results.  ----- Message from [name], NP sent at 06/12/2022  9:02 AM EDT ----- Normal pumping function, EF 72%.  Left ventricle is mildly thick in size, also known as hypertrophy.  It looks like her left ventricle is not relaxing as it should, and has progressed since last echo in 2021.  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  Right ventricle is working good.  Mitral valve is minimally leaky.  Aortic valve is moderately calcified, but is working normally.  No other significant valvular abnormalities.  Because her diastolic function of left ventricle has progressed, I would like to begin a medicine called Farxiga to help with this.  If she is okay with it, let's initiate Farxiga 10 mg daily to her medication regimen.  Her kidney function is good to tolerate this.  If she is interested with starting this medication, lets check a BMET in 2 weeks after initiating.   If she is not interested in this medication, she will follow-up with [name], NP next month who can discuss this with her.  If her symptoms worsen or do not improve prior to next follow-up visit, she needs to contact our office to be seen sooner.  She needs to weigh herself daily and let our office know if she gains more than 3 pounds per day or more than 5 pounds per week.  She needs to limit salt in her diet to less than 2 g/day and monitor her liquid intake.  She should consume no more than 2 L of liquid per day.  Thanks!  [name], AGNP-C

## 2022-06-14 ENCOUNTER — Telehealth: Payer: Self-pay | Admitting: Nurse Practitioner

## 2022-06-14 NOTE — Telephone Encounter (Signed)
Called and spoke with patient and updated regarding echocardiogram results.  Discussed my recommendations of initiating Farxiga 10 mg daily to help with diastolic dysfunction, however she is very resistant to starting a new medication.  Says she would like to wait to discuss with [name], NP at follow-up visit next month. Denies any acute signs of symptoms of acute heart failure. Says she is working on diet, exercise, and her weight.  Recently went to her primary care provider for 28-monthvisit, manual BP was 1789systolic and was greatly improved.  She has been taking metoprolol tartrate as needed for palpitations and has noticed improvement with her palpitations.  She does describe stressful situation with sister who has dementia, says she has been under stress with that situation.  She defers any sEducation officer, museumresources at this time. She verbalizes understanding of our conversation and was appreciative of my call.   EFinis Bud NP

## 2022-06-16 DIAGNOSIS — R002 Palpitations: Secondary | ICD-10-CM | POA: Diagnosis not present

## 2022-06-16 DIAGNOSIS — I10 Essential (primary) hypertension: Secondary | ICD-10-CM | POA: Diagnosis not present

## 2022-07-08 NOTE — Progress Notes (Unsigned)
Office Visit    Patient Name: [name] Date of Encounter: 07/09/2022  PCP:  Tawnya Crook, MD   [facility]  Cardiologist:  [name], MD  Advanced Practice Provider:  No care team member to display Electrophysiologist:  None      Chief Complaint    [name] is a 81 y.o. female presents today for follow up after ZIO monitor   Past Medical History    Past Medical History:  Diagnosis Date   Anxiety    Arthritis    right thumb CMC arthritis   Asthmatic bronchitis 2017   Cancer ([place])    -NHL R parotid   Cataract    bilateral small- removed both eyes    Colon cancer ([place])    stage 1- at appendiceal orifice    Complication of anesthesia    has dry mouth anyway from shograns-sore throats post op   Coronary artery calcification seen on CAT scan 11/14/2019   Depression    since 1982    Diverticulosis    Fibromyalgia    GERD (gastroesophageal reflux disease)    past hx    Heart murmur    Hyperlipidemia    controlled - metabolic syndrome per pt- goes up and down    Hypertension    Lung infiltrate on CT 09/14/2015   Meningioma ([place]) 11/2009   right post parietal 88m - stable 11/2009 MRI   Nephrolithiasis    Nephrolithiasis    Neuromuscular disorder ([place]    fibromyalgia   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   Pure hypercholesterolemia 11/14/2019   Sjogren's disease ([place]    Past Surgical History:  Procedure Laterality Date   ABDOMINAL HYSTERECTOMY     APPENDECTOMY  2008   ARTERY BIOPSY  02/03/2013   Procedure: MINOR BIOPSY TEMPORAL ARTERY;  Surgeon: SAscencion Dike MD;  [place] [facility]  Service: ENT;;   COLONOSCOPY     [place] CORONARY [place]APHY N/A 12/01/2019   Procedure: LEFT HEART CATH AND CORONARY ANGIOGRAPHY;  Surgeon: KTroy Sine MD;  [facility];  Service: Cardiovascular;  Laterality: N/A;   MASS  EXCISION  2008   abd-benign   PAROTIDECTOMY Right 08/01/2015   Procedure: RIGHT PAROTIDECTOMY;  Surgeon: SLeta Baptist MD;  [facility] [facility]  Service: ENT;  Laterality: Right;   PAROTIDECTOMY Left 04/04/2021   Procedure: LEFT PAROTIDECTOMY;  Surgeon: TLeta Baptist MD;  [place]  Service: ENT;  Laterality: Left;   POLYPECTOMY     TONSILLECTOMY     VAGINAL HYSTERECTOMY  1989    Allergies  Allergies  Allergen Reactions   Lisinopril Cough    History of Present Illness    Jillian AOUNis a 81y.o. female with a hx of palpitations, HTN, HLD, CAD, cystic lung disease, GERD, intracranial meningioma, Sjogren's syndrome, non-Hodgkin's lymphoma, fibromyalgia, colon cancer, anxiety last seen 05/22/22.  In 2021 reported symptoms concerning for angina with cardiac catheterization 221 with minimal disease (10% mid LAD) with LVEF 60-65%. Echo with LVEF 70-75%, gr1DD, aortic valve sclerosis without stenosis. She has hypertension with intolerance to multiple medications.   At last visit 05/22/22 noted palpitations over last 4 weeks. BP at home 140-150s. She did note stressors. Her Losartan was transitioned to Valsartan.  Echo 06/08/22 with LVEF 72%, gr2DD, mild LVH, RV normal, trivial MR. Monitor with  predominantly NSR with 30 episodes of SVT with longest 11.3 seconds. None of her episodes of SVT were triggered. Labs including CMP, TSH, magnesium notable only for triglycerides 154, LDL 96. She was started on Metoprolol tartrate '25mg'$  BID PRN with the approval of her pulmonologist.  She has been difficult to get ahold of to relay results and recommendations. Based on echo, was recommended to start Farxiga '10mg'$  QD.   She presents today for follow up. Enjoys reading in her spare time. Notes while sitting and reading she will doze off which she attributes to her medications. However, she later notes her "Sjogren's and fibromyalgia knocks you out with fatigue". Notes her  sister's daughter is mentally ill and having some behavioral issues which has caused stress which she attributes her elevated blood pressure to. Declines referral to psychology.   Takes her Hydralazine '25mg'$  twice daily at 8am and 8 pm. She did miss a dose this weekend and tells me she felt her energy level was better. Took her medication again Sunday and felt more fatigued. She also attributes her dry mouth to her medications, discussed that this is likely more related to her Sjogren's.  She does not have a blood pressure cuff at home. She does not sleep well but tells me this is her usual as she is a night owl.   Previous antihypertensives Amlodipine Losartan  EKGs/Labs/Other Studies Reviewed:   The following studies were reviewed today:  Echo 06/08/22  1. Left ventricular ejection fraction by 3D volume is 72 %. The left  ventricle has hyperdynamic function. The left ventricle has no regional  wall motion abnormalities. There is mild left ventricular hypertrophy.  Left ventricular diastolic parameters are   consistent with Grade II diastolic dysfunction (pseudonormalization).   2. Right ventricular systolic function is normal. The right ventricular  size is normal. There is normal pulmonary artery systolic pressure. The  estimated right ventricular systolic pressure is 62.8 mmHg.   3. Left atrial size was mildly dilated.   4. The mitral valve is grossly normal. Trivial mitral valve  regurgitation. No evidence of mitral stenosis.   5. The aortic valve is abnormal. There is moderate calcification of the  aortic valve. Aortic valve regurgitation is not visualized. Aortic valve  sclerosis/calcification is present, without any evidence of aortic  stenosis.   6. The inferior vena cava is normal in size with greater than 50%  respiratory variability, suggesting right atrial pressure of 3 mmHg.  Monitor 06/19/22 14 Day Zio Monitor   Quality: Fair.  Baseline artifact. Predominant rhythm:  Sinus rhythm Average heart rate: 78 bpm Max heart rate: 124 bpm in sinus rhythm.  187 bpm in SVT. Min heart rate: 56 bpm Pauses >2.5 seconds: none   Rare PVCs and PACs. 30 episodes of SVT.  Fastest rate 187 bpm.  Longest episode 11.3 seconds. EKG:  EKG is not ordered today.    Recent Labs: 05/29/2022: ALT 14; BUN 11; Creatinine, Ser 0.71; Hemoglobin 12.1; Magnesium 2.2; Platelets 249; Potassium 4.6; Sodium 141; TSH 3.250  Recent Lipid Panel    Component Value Date/Time   CHOL 171 05/29/2022 0905   TRIG 154 (H) 05/29/2022 0905   HDL 48 05/29/2022 0905   CHOLHDL 3.6 05/29/2022 0905   CHOLHDL 4 08/24/2021 0841   VLDL 38.8 08/24/2021 0841   LDLCALC 96 05/29/2022 0905   LDLDIRECT 129.5 06/10/2013 0956   Home Medications   Current Meds  Medication Sig   acetaminophen (TYLENOL) 500 MG tablet Take 500 mg by  mouth every 6 (six) hours as needed for moderate pain.   augmented betamethasone dipropionate (DIPROLENE-AF) 0.05 % cream Apply 1 a small amount to affected area twice a day   Cholecalciferol (VITAMIN D3) 50 MCG (2000 UT) TABS Take 2 tablets by mouth daily.   clotrimazole-betamethasone (LOTRISONE) cream Apply topically 2 (two) times daily.   metoprolol tartrate (LOPRESSOR) 25 MG tablet TAKE 1 TABLET(25 MG) BY MOUTH TWICE DAILY AS NEEDED FOR PALPITATIONS   PFIZER COVID-19 VAC BIVALENT injection    PREVNAR 20 0.5 ML injection    SHINGRIX injection    simvastatin (ZOCOR) 20 MG tablet Take 1 tablet (20 mg total) by mouth at bedtime.   valsartan (DIOVAN) 160 MG tablet Take 1 tablet (160 mg total) by mouth daily.   [DISCONTINUED] fenofibrate 160 MG tablet Take 1 tablet (160 mg total) by mouth daily.   [DISCONTINUED] hydrALAZINE (APRESOLINE) 50 MG tablet Take 1 tablet (50 mg total) by mouth in the morning and at bedtime. 12 HOURS APART (Patient taking differently: Take 25 mg by mouth in the morning and at bedtime. 12 HOURS APART/ Take 25 mg BID)     Review of Systems      All other  systems reviewed and are otherwise negative except as noted above.  Physical Exam    VS:  BP (!) 160/68   Pulse 86   Ht '5\' 9"'$  (1.753 m)   Wt 201 lb 12.8 oz (91.5 kg)   SpO2 96%   BMI 29.80 kg/m  , BMI Body mass index is 29.8 kg/m.  Wt Readings from Last 3 Encounters:  07/09/22 201 lb 12.8 oz (91.5 kg)  06/04/22 202 lb (91.6 kg)  05/22/22 204 lb (92.5 kg)    GEN: Well nourished, overweight,  well developed, in no acute distress. HEENT: normal. Neck: Supple, no JVD, carotid bruits, or masses. Cardiac: RRR, no murmurs, rubs, or gallops. No clubbing, cyanosis, edema.  Radials/PT 2+ and equal bilaterally.  Respiratory:  Respirations regular and unlabored, clear to auscultation bilaterally. GI: Soft, nontender, nondistended. MS: No deformity or atrophy. Skin: Warm and dry, no rash. Neuro:  Strength and sensation are intact. Psych: Normal affect. Anxious.   Assessment & Plan    Palpitations - Overall quiescent. Has Metoprolol tartrate PRN which she prefers to continue.  Diastolic dysfunction - Echo 06/2022 with normal LVEF, gr2DD.  Euvolemic on exam. Reports no new dyspnea nor edema. Given Sjogren's will defer diuretics. She is hesitant regarding additional medications and as she is asymptomatic will defer SGLTI/ARNI at this time.   HTN / Medication management - BP not at goal <130/80. She is very hesitant regarding medication changes. She is agreeable to continue Hydralazine BID and move Valsartan to the evening as she feels it is causing her fatigue. Discussed that fatigue more likely related to her Sjogren's, fibromyalgia, inactivity.   Nonobstructive CAD / HLD, LDL goal <70 - Stable with no anginal symptoms. No indication for ischemic evaluation.  Continue Metoprolol, Simvastatin. Heart healthy diet and regular cardiovascular exercise encouraged.  Given triglycerides of 154 and interest in decreasing her medications, will stop fenofibrate.   HYPERTENSION CONTROL Vitals:    07/09/22 0908 07/09/22 0947  BP: (!) 158/66 (!) 160/68    The patient's blood pressure is elevated above target today.  In order to address the patient's elevated BP: A current anti-hypertensive medication was adjusted today.; Follow up with general cardiology has been recommended. (Declines changes other than timing of her medications.)  Disposition: Follow up in 4-6 month(s) with [name], MD or APP.  Signed, Loel Dubonnet, NP 07/09/2022, 10:43 AM [place]

## 2022-07-09 ENCOUNTER — Encounter (HOSPITAL_BASED_OUTPATIENT_CLINIC_OR_DEPARTMENT_OTHER): Payer: Self-pay | Admitting: Family

## 2022-07-09 ENCOUNTER — Ambulatory Visit (HOSPITAL_BASED_OUTPATIENT_CLINIC_OR_DEPARTMENT_OTHER): Payer: PPO | Admitting: Family

## 2022-07-09 VITALS — BP 160/68 | HR 86 | Ht 69.0 in | Wt 201.8 lb

## 2022-07-09 DIAGNOSIS — Z79899 Other long term (current) drug therapy: Secondary | ICD-10-CM | POA: Diagnosis not present

## 2022-07-09 DIAGNOSIS — E785 Hyperlipidemia, unspecified: Secondary | ICD-10-CM | POA: Diagnosis not present

## 2022-07-09 DIAGNOSIS — I25118 Atherosclerotic heart disease of native coronary artery with other forms of angina pectoris: Secondary | ICD-10-CM | POA: Diagnosis not present

## 2022-07-09 DIAGNOSIS — I10 Essential (primary) hypertension: Secondary | ICD-10-CM | POA: Diagnosis not present

## 2022-07-09 DIAGNOSIS — I5189 Other ill-defined heart diseases: Secondary | ICD-10-CM | POA: Diagnosis not present

## 2022-07-09 MED ORDER — HYDRALAZINE HCL 50 MG PO TABS: 50.0000 mg | ORAL_TABLET | Freq: Two times a day (BID) | ORAL | 3 refills | Status: DC

## 2022-07-09 NOTE — Patient Instructions (Addendum)
Medication Instructions:  Your physician has recommended you make the following change in your medication:   CONTINUE Hydralazine twice daily  CHANGE Valsartan to '160mg'$  in the evening at bedtime  STOP Fenofibrate   *If you need a refill on your cardiac medications before your next appointment, please call your pharmacy*   Lab Work/Testing/Procedures:  Your monitor showed an occasional episode of fast heart beat that self resolved and is not of concern.   Your echocardiogram showed your heart muscle pumped well but did not relax well. This is called diastolic dysfunction.   Follow-Up: At [facility], you and your health needs are our priority.  As part of our continuing mission to provide you with exceptional heart care, we have created designated Provider Care Teams.  These Care Teams include your primary Cardiologist (physician) and Advanced Practice Providers (APPs -  Physician Assistants and Nurse Practitioners) who all work together to provide you with the care you need, when you need it.  We recommend signing up for the patient portal called "MyChart".  Sign up information is provided on this After Visit Summary.  MyChart is used to connect with patients for Virtual Visits (Telemedicine).  Patients are able to view lab/test results, encounter notes, upcoming appointments, etc.  Non-urgent messages can be sent to your provider as well.   To learn more about what you can do with MyChart, go to [email or web address].    Your next appointment:   4-6 month(s)  The format for your next appointment:   In Person  Provider:   [name], MD  Other Instructions  Heart Healthy Diet Recommendations: A low-salt diet is recommended. Meats should be grilled, baked, or boiled. Avoid fried foods. Focus on lean protein sources like fish or chicken with vegetables and fruits. The American Heart Association is a Microbiologist!  American Heart Association Diet and Lifeystyle  Recommendations   Exercise recommendations: The American Heart Association recommends 150 minutes of moderate intensity exercise weekly. Try 30 minutes of moderate intensity exercise 4-5 times per week. This could include walking, jogging, or swimming. Exercise as much as your Sjogren's or Fibromyalgia will allow, even gentle stretching while seated is helpful!

## 2022-07-17 DIAGNOSIS — L814 Other melanin hyperpigmentation: Secondary | ICD-10-CM | POA: Diagnosis not present

## 2022-07-17 DIAGNOSIS — D1801 Hemangioma of skin and subcutaneous tissue: Secondary | ICD-10-CM | POA: Diagnosis not present

## 2022-07-17 DIAGNOSIS — Z85828 Personal history of other malignant neoplasm of skin: Secondary | ICD-10-CM | POA: Diagnosis not present

## 2022-07-17 DIAGNOSIS — L821 Other seborrheic keratosis: Secondary | ICD-10-CM | POA: Diagnosis not present

## 2022-07-17 DIAGNOSIS — L720 Epidermal cyst: Secondary | ICD-10-CM | POA: Diagnosis not present

## 2022-07-17 DIAGNOSIS — L820 Inflamed seborrheic keratosis: Secondary | ICD-10-CM | POA: Diagnosis not present

## 2022-07-31 ENCOUNTER — Telehealth: Payer: Self-pay | Admitting: Cardiovascular Disease

## 2022-07-31 DIAGNOSIS — Z79899 Other long term (current) drug therapy: Secondary | ICD-10-CM

## 2022-07-31 NOTE — Telephone Encounter (Signed)
Pt c/o medication issue:  1. Name of Medication:  Potassium   2. How are you currently taking this medication (dosage and times per day)?   3. Are you having a reaction (difficulty breathing--STAT)?   4. What is your medication issue?  Patient states she was taken off of Potassium in the past due to elevated BP. Patient states that she has been experiencing cramping in her legs--mainly occurs at night when she is trying to sleep. She would like to know if she can go back on Potassium 99 MG every other day.

## 2022-07-31 NOTE — Telephone Encounter (Signed)
Her potassium was previously stopped due to high potassium, not due to elevated blood pressure. She really should follow up with primary care regarding leg cramping. Her currently blood pressure medications do not typically cause cramping. If she wishes, she may check BMP and magnesium level however these were both normal in October.   Loel Dubonnet, NP

## 2022-07-31 NOTE — Telephone Encounter (Signed)
Okay to have patient get new BMP, then evaluate for need of K+?

## 2022-07-31 NOTE — Telephone Encounter (Signed)
Attempted to return call to patient, line just rings, no answering machine option. Will retry    "Her potassium was previously stopped due to high potassium, not due to elevated blood pressure. She really should follow up with primary care regarding leg cramping. Her currently blood pressure medications do not typically cause cramping. If she wishes, she may check BMP and magnesium level however these were both normal in October.    Loel Dubonnet, NP"

## 2022-08-01 NOTE — Telephone Encounter (Signed)
Returned call to patient, she will get the labs drawn and then await the results to see if Potassium needs to be restarted. Labs mailed to patient.      "Her potassium was previously stopped due to high potassium, not due to elevated blood pressure. She really should follow up with primary care regarding leg cramping. Her currently blood pressure medications do not typically cause cramping. If she wishes, she may check BMP and magnesium level however these were both normal in October.    Loel Dubonnet, NP"

## 2022-08-01 NOTE — Addendum Note (Signed)
Addended by: Gerald Stabs on: 08/01/2022 08:28 AM   Modules accepted: Orders

## 2022-08-08 ENCOUNTER — Telehealth: Payer: Self-pay | Admitting: Cardiovascular Disease

## 2022-08-08 NOTE — Telephone Encounter (Signed)
Patient  called stating she is having leg cramps and would like to take potassium she would like to have lab work done, she said she has been waiting for 9 days for this and hasn't heard anything.

## 2022-08-08 NOTE — Telephone Encounter (Signed)
Returned call to patient and apologized to patient for delay in getting lab slips in the mail. Advised patient I would print lab slips and leave them at the front desk for her to pick up and have done when she is ready. States she cannot come today, but will try to come have them done before Friday at closing time. Advised her of the [facility] lab hours.

## 2022-08-10 DIAGNOSIS — Z79899 Other long term (current) drug therapy: Secondary | ICD-10-CM | POA: Diagnosis not present

## 2022-08-11 LAB — BASIC METABOLIC PANEL
BUN/Creatinine Ratio: 20 (ref 12–28)
BUN: 11 mg/dL (ref 8–27)
CO2: 22 mmol/L (ref 20–29)
Calcium: 9.4 mg/dL (ref 8.7–10.3)
Chloride: 102 mmol/L (ref 96–106)
Creatinine, Ser: 0.56 mg/dL — ABNORMAL LOW (ref 0.57–1.00)
Glucose: 102 mg/dL — ABNORMAL HIGH (ref 70–99)
Potassium: 4.4 mmol/L (ref 3.5–5.2)
Sodium: 139 mmol/L (ref 134–144)
eGFR: 92 mL/min/{1.73_m2} (ref >59)

## 2022-08-11 LAB — MAGNESIUM: Magnesium: 2.3 mg/dL (ref 1.6–2.3)

## 2022-08-15 ENCOUNTER — Telehealth: Payer: Self-pay | Admitting: Cardiovascular Disease

## 2022-08-15 NOTE — Telephone Encounter (Signed)
Patient is returning call to discuss lab results. 

## 2022-08-15 NOTE — Telephone Encounter (Signed)
Returned call to patient and provided the following information to the patient. Patient verbalized understanding and will follow up with PCP      "Potassium and magnesium are both normal. Stable kidney function. Her leg cramping is not due to electrolyte abnormality. If still noting cramping, recommend discussion with primary care."

## 2022-08-27 DIAGNOSIS — M189 Osteoarthritis of first carpometacarpal joint, unspecified: Secondary | ICD-10-CM | POA: Diagnosis not present

## 2022-08-27 DIAGNOSIS — M79641 Pain in right hand: Secondary | ICD-10-CM | POA: Diagnosis not present

## 2022-08-27 DIAGNOSIS — M79642 Pain in left hand: Secondary | ICD-10-CM | POA: Diagnosis not present

## 2022-09-28 ENCOUNTER — Encounter: Payer: Self-pay | Admitting: Family Medicine

## 2022-09-28 ENCOUNTER — Ambulatory Visit (INDEPENDENT_AMBULATORY_CARE_PROVIDER_SITE_OTHER): Payer: PPO | Admitting: Family Medicine

## 2022-09-28 VITALS — BP 130/60 | HR 74 | Temp 97.9°F | Ht 69.0 in | Wt 202.2 lb

## 2022-09-28 DIAGNOSIS — R1010 Upper abdominal pain, unspecified: Secondary | ICD-10-CM | POA: Diagnosis not present

## 2022-09-28 DIAGNOSIS — Z85038 Personal history of other malignant neoplasm of large intestine: Secondary | ICD-10-CM | POA: Diagnosis not present

## 2022-09-28 NOTE — Progress Notes (Unsigned)
Subjective:     Patient ID: [name], female    DOB: 10-May-1941, 82 y.o.   MRN: [id number]  Chief Complaint  Patient presents with   Pain    Lower left rib cage pain that lasted 1-2 hours and then went away early yesterday morning    HPI  Pain 1 wk ago-at 230am, was trying to go to sleep and pain LUQ under ribs-"severe".  after about 4mn, decreased some but lasted 2 hrs.  No sob, no rad, no nausea, etc.  Stopped and nothing else since.   Figured "still alive so not heart" At times, when lies down at hs, ache upper abd.  For 2-3 wks.   Fh colon ca-upset no more cscope d/t age.  Worried.  Has a friend that still gets them.  Card-very upset about not seeing the doctor.  Doesn't know status of "things".  Wonders if should keep fenofibrate or throw away.  Pt not sure why stopped-reviewed Card notes-per note, pt wanted to decrease pill burden so told to stop fenofibrate.   Health Maintenance Due  Topic Date Due   DTaP/Tdap/Td (2 - Tdap) 04/29/2016   HEMOGLOBIN A1C  02/21/2022    Past Medical History:  Diagnosis Date   Anxiety    Arthritis    right thumb CMC arthritis   Asthmatic bronchitis 2017   [place]    -NHL R parotid   Cataract    bilateral small- removed both eyes    [place]    stage 1- at appendiceal orifice    Complication of anesthesia    has dry mouth anyway from shograns-sore throats post op   Coronary artery calcification seen on CAT scan 11/14/2019   Depression    since 1982    Diverticulosis    Fibromyalgia    GERD (gastroesophageal reflux disease)    past hx    Heart murmur    Hyperlipidemia    controlled - metabolic syndrome per pt- goes up and down    Hypertension    Lung infiltrate on CT 09/14/2015   Meningioma ([place] 11/2009   right post parietal 184m- stable 11/2009 MRI   Nephrolithiasis    Nephrolithiasis    Neuromuscular disorder ([place]   fibromyalgia   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[place]/06/2016    Pure hypercholesterolemia 11/14/2019   S[place]ogren's disease ([place]    Past Surgical History:  Procedure Laterality Date   ABDOMINAL HYSTERECTOMY     APPENDECTOMY  2008   ARTERY BIOPSY  02/03/2013   Procedure: MINOR BIOPSY TEMPORAL ARTERY;  Surgeon: SuAscencion DikeMD;  [place] Service: ENT;;   COLONOSCOPY     [place] AND CORONARY ANGIOGRAPHY N/A 12/01/2019   Procedure: LEFT HEART CATH AND CORONARY ANGIOGRAPHY;  Surgeon: KeTroy SineMD;  [place] [facility];  Service: Cardiovascular;  Laterality: N/A;   MASS EXCISION  2008   abd-benign   PAROTIDECTOMY Right 08/01/2015   Procedure: RIGHT PAROTIDECTOMY;  Surgeon: SuLeta BaptistMD;  [place] [facility] Service: ENT;  Laterality: Right;   PAROTIDECTOMY Left 04/04/2021   Procedure: LEFT PAROTIDECTOMY;  Surgeon: TeLeta BaptistMD;  [place] [facility] Service: ENT;  Laterality: Left;   [place]  Outpatient Medications Prior to Visit  Medication Sig  Dispense Refill   acetaminophen (TYLENOL) 500 MG tablet Take 500 mg by mouth every 6 (six) hours as needed for moderate pain.     augmented betamethasone dipropionate (DIPROLENE-AF) 0.05 % cream Apply 1 a small amount to affected area twice a day     bimatoprost (LATISSE) 0.03 % ophthalmic solution APPLY SERUM TO BOTH UPPER EYELIDS ONCE DAILY     Cholecalciferol (VITAMIN D3) 50 MCG (2000 UT) TABS Take 2 tablets by mouth daily.     clotrimazole-betamethasone (LOTRISONE) cream Apply topically 2 (two) times daily.     hydrALAZINE (APRESOLINE) 50 MG tablet Take 1 tablet (50 mg total) by mouth in the morning and at bedtime. 12 HOURS APART 180 tablet 3   metoprolol tartrate (LOPRESSOR) 25 MG tablet TAKE 1 TABLET(25 MG) BY MOUTH TWICE DAILY AS NEEDED FOR PALPITATIONS 180 tablet 3   PFIZER COVID-19 VAC BIVALENT injection      PREVNAR 20 0.5 ML injection       SHINGRIX injection      simvastatin (ZOCOR) 20 MG tablet Take 1 tablet (20 mg total) by mouth at bedtime. 90 tablet 3   valsartan (DIOVAN) 160 MG tablet Take 1 tablet (160 mg total) by mouth daily. 90 tablet 3   No facility-administered medications prior to visit.    Allergies  Allergen Reactions   Lisinopril Cough   ROS neg/noncontributory except as noted HPI/below      Objective:     BP 130/60   Pulse 74   Temp 97.9 F (36.6 C) (Temporal)   Ht 5' 9"$  (1.753 m)   Wt 202 lb 4 oz (91.7 kg)   SpO2 97%   BMI 29.87 kg/m  Wt Readings from Last 3 Encounters:  09/28/22 202 lb 4 oz (91.7 kg)  07/09/22 201 lb 12.8 oz (91.5 kg)  06/04/22 202 lb (91.6 kg)    Physical Exam   Gen: WDWN NAD HEENT: NCAT, conjunctiva not injected, sclera nonicteric NECK:  supple, no thyromegaly, no nodes, no carotid bruits CARDIAC: RRR, S1S2+, + 1/2/6 murmur. DP 2+B LUNGS: CTAB. No wheezes ABDOMEN:  BS+, soft, min upper abd tenderness., No HSM, no masses EXT:  no edema MSK: no gross abnormalities.  NEURO: A&O x3.  CN II-XII intact.  PSYCH: normal mood. Good eye contact  EKG-NSR, inverted T in 3, V1 V2       Assessment & Plan:   Problem List Items Addressed This Visit       Other   History of colon cancer, stage I   Other Visit Diagnoses     Pain of upper abdomen    -  Primary   Relevant Orders   Comprehensive metabolic panel (Completed)   CBC with Differential/Platelet (Completed)   CK (Completed)   EKG 12-Lead (Completed)      Upper abd pain-?cardiac, ?colon, ?muscle cramps, costochondritis, other.  EKG with some inverted T waves-will d/w Card.  Check cbc,cmp,ck    History of colon cancer/family history.  Patient disturbed by the fact that she no longer gets colonoscopies due to age.  Advised to follow-up with GI and express her concerns.  No orders of the defined types were placed in this encounter.   Wellington Hampshire, MD

## 2022-09-29 LAB — CBC WITH DIFFERENTIAL/PLATELET
Absolute Monocytes: 550 cells/uL (ref 200–950)
Basophils Absolute: 38 cells/uL (ref 0–200)
Basophils Relative: 0.6 %
Eosinophils Absolute: 198 cells/uL (ref 15–500)
Eosinophils Relative: 3.1 %
HCT: 34.3 % — ABNORMAL LOW (ref 35.0–45.0)
Hemoglobin: 11.3 g/dL — ABNORMAL LOW (ref 11.7–15.5)
Lymphs Abs: 1146 cells/uL (ref 850–3900)
MCH: 28.9 pg (ref 27.0–33.0)
MCHC: 32.9 g/dL (ref 32.0–36.0)
MCV: 87.7 fL (ref 80.0–100.0)
MPV: 11.3 fL (ref 7.5–12.5)
Monocytes Relative: 8.6 %
Neutro Abs: 4467 cells/uL (ref 1500–7800)
Neutrophils Relative %: 69.8 %
Platelets: 247 10*3/uL (ref 140–400)
RBC: 3.91 10*6/uL (ref 3.80–5.10)
RDW: 12.1 % (ref 11.0–15.0)
Total Lymphocyte: 17.9 %
WBC: 6.4 10*3/uL (ref 3.8–10.8)

## 2022-09-29 LAB — COMPREHENSIVE METABOLIC PANEL
AG Ratio: 1.2 (calc) (ref 1.0–2.5)
ALT: 12 U/L (ref 6–29)
AST: 13 U/L (ref 10–35)
Albumin: 4.1 g/dL (ref 3.6–5.1)
Alkaline phosphatase (APISO): 76 U/L (ref 37–153)
BUN: 17 mg/dL (ref 7–25)
CO2: 24 mmol/L (ref 20–32)
Calcium: 9.6 mg/dL (ref 8.6–10.4)
Chloride: 103 mmol/L (ref 98–110)
Creat: 0.65 mg/dL (ref 0.60–0.95)
Globulin: 3.3 g/dL (calc) (ref 1.9–3.7)
Glucose, Bld: 96 mg/dL (ref 65–99)
Potassium: 4.1 mmol/L (ref 3.5–5.3)
Sodium: 138 mmol/L (ref 135–146)
Total Bilirubin: 0.3 mg/dL (ref 0.2–1.2)
Total Protein: 7.4 g/dL (ref 6.1–8.1)

## 2022-09-29 LAB — CK: Total CK: 56 U/L (ref 29–143)

## 2022-09-30 NOTE — Progress Notes (Signed)
Labs are okay except slight anemia.  Advised to take iron 325 mg OTC every other day.  It may cause constipation so may need to take Colace 100 mg daily to prevent it.  Repeat CBC, B12, iron studies in 1 month.  Advised to call and follow-up with GI

## 2022-10-01 ENCOUNTER — Other Ambulatory Visit: Payer: Self-pay | Admitting: *Deleted

## 2022-10-01 DIAGNOSIS — D649 Anemia, unspecified: Secondary | ICD-10-CM

## 2022-10-02 ENCOUNTER — Telehealth (HOSPITAL_BASED_OUTPATIENT_CLINIC_OR_DEPARTMENT_OTHER): Payer: Self-pay | Admitting: *Deleted

## 2022-10-02 NOTE — Telephone Encounter (Signed)
Hey, this pt called back and rescheduled for that 05/15 appt you gave her earlier. She asked me to reach out to you and apologize she had a lot going on at the time and said she shouldn't of taken the call.   Above message received from schedulers

## 2022-10-02 NOTE — Telephone Encounter (Signed)
[name] received call from Dr Cherlynn Kaiser regarding patients EKG [name] asked that I reach out to patient and get her scheduled for EKG changes Called patient and offered her appointment 2/15, patient declined  Patient stated she had not seen [name] in over a year and she didn't think that was ok since it is dealing with her heart Explained to patient that she saw [name] in July, denied seeing her Explained that I could see office note from the July visit and was to follow up in 6 months  Patient stated she had been seen several times since then and none by Dr Arville Lime to explain to patient that it is common for patients to see physician and then follow up with NP/PA. She also already had appointment in May and that was less than a year from her last visit with [name]  Was told by patient she was with someone and could not talk about this any longer.  Asked patient if she wanted for me to cancel appointment with [name] Thursday morning and in May, stated yes to cancelling both appointments Cancelled as requested, will forward to [name] and PCP so they will both be aware.

## 2022-10-04 ENCOUNTER — Ambulatory Visit (HOSPITAL_BASED_OUTPATIENT_CLINIC_OR_DEPARTMENT_OTHER): Payer: PPO | Admitting: Cardiovascular Disease

## 2022-10-15 ENCOUNTER — Telehealth: Payer: Self-pay | Admitting: Family Medicine

## 2022-10-15 NOTE — Telephone Encounter (Signed)
Patient states she is no longer seeing Dr. [name] (was scheduled for May appointment which is too far out).  Patient requests to be referred to a different Cardiologist:  Dr. Nikki Dom located at [facility] at [facility], located at [place] she can be seen within 1 month

## 2022-10-25 ENCOUNTER — Telehealth: Payer: Self-pay | Admitting: Cardiovascular Disease

## 2022-10-25 ENCOUNTER — Other Ambulatory Visit: Payer: Self-pay | Admitting: *Deleted

## 2022-10-25 DIAGNOSIS — R9431 Abnormal electrocardiogram [ECG] [EKG]: Secondary | ICD-10-CM

## 2022-10-25 NOTE — Telephone Encounter (Signed)
  Pt is requesting to switch from Dr. Oval Linsey to Dr. Johney Frame  Please advise

## 2022-10-25 NOTE — Telephone Encounter (Signed)
Referral placed to Dr. Nikki Dom located at [facility] at [facility], located at [place] as requested.

## 2022-10-25 NOTE — Telephone Encounter (Signed)
Left message for patient to return call to office. 

## 2022-11-05 NOTE — Telephone Encounter (Signed)
Patient calling back for update. Please advise  

## 2022-11-14 DIAGNOSIS — H04123 Dry eye syndrome of bilateral lacrimal glands: Secondary | ICD-10-CM | POA: Diagnosis not present

## 2022-11-14 DIAGNOSIS — Z961 Presence of intraocular lens: Secondary | ICD-10-CM | POA: Diagnosis not present

## 2022-11-22 ENCOUNTER — Telehealth: Payer: Self-pay

## 2022-11-22 NOTE — Telephone Encounter (Signed)
Called and given below message. She verbalized understanding and will call Dr. Benjamine Mola for appt.

## 2022-11-22 NOTE — Telephone Encounter (Signed)
I suggest she sees Dr. Benjamine Mola first Call back after evaluation

## 2022-11-22 NOTE — Telephone Encounter (Signed)
Returned her call. The left side of her neck she developed a hard lump on the left side. It feels like a gum ball. She is asking what Dr. Alvy Bimler recommends? Next appt is 8/27 at [facility]. She is thinking she needs a earlier appt.  She was going to call Dr. Benjamine Mola to get appts but wanted to ask Dr. Alvy Bimler first.

## 2022-11-23 ENCOUNTER — Telehealth: Payer: Self-pay | Admitting: Pulmonary Disease

## 2022-11-23 ENCOUNTER — Telehealth: Payer: Self-pay

## 2022-11-23 DIAGNOSIS — R609 Edema, unspecified: Secondary | ICD-10-CM

## 2022-11-23 DIAGNOSIS — R220 Localized swelling, mass and lump, head: Secondary | ICD-10-CM

## 2022-11-23 NOTE — Telephone Encounter (Signed)
FYI, order placed

## 2022-11-23 NOTE — Telephone Encounter (Signed)
I can see her on 4/16 or 4/18, no labs

## 2022-11-23 NOTE — Telephone Encounter (Signed)
Returned her call. She called Dr. Avel Sensor office and he is no longer seeing patients with throat issues. They would not recommend another ENT.  She is asking if you can recommend a ENT. The lymph node site has become a little painful to touch. She is waiting for her pulmonologist office to call her back. She left a message asking if they can recommend a ENT.

## 2022-11-23 NOTE — Telephone Encounter (Signed)
PT want to know if Dr Everardo All can recommend her to ENT

## 2022-11-23 NOTE — Telephone Encounter (Signed)
Called and scheduled appt on 4/16 at 0940. She is aware of appt and will call the office back if needed.

## 2022-11-23 NOTE — Telephone Encounter (Signed)
She is having recurrent left facial swelling. Has previously been followed by Dr. Suszanne Conners in 2016 for biopsies of her parotid. She tried to contact his office however he no longer manages this. She was told she needs a new referral to ENT.  Referral placed

## 2022-11-26 NOTE — Telephone Encounter (Signed)
Please send patient to a different ENT location. This is not a Dermatology issue. Please adjust diagnosis code to parotid swelling

## 2022-11-26 NOTE — Telephone Encounter (Signed)
Left facial swelling is for Dermatology, needing to switch area speciality referral needing confirmation   Called Dr. Avel Sensor to process referral and they state it does not need to go to them. Ear and nose referrals only.

## 2022-11-29 ENCOUNTER — Telehealth: Payer: Self-pay

## 2022-11-29 NOTE — Telephone Encounter (Signed)
Returned her call. Reviewed upcoming appts. She is aware of appts.

## 2022-12-04 ENCOUNTER — Other Ambulatory Visit: Payer: Self-pay

## 2022-12-04 ENCOUNTER — Encounter: Payer: Self-pay | Admitting: Hematology and Oncology

## 2022-12-04 ENCOUNTER — Inpatient Hospital Stay: Payer: PPO | Attending: Hematology and Oncology | Admitting: Hematology and Oncology

## 2022-12-04 VITALS — BP 143/52 | HR 80 | Temp 97.6°F | Resp 18 | Ht 69.0 in | Wt 201.8 lb

## 2022-12-04 DIAGNOSIS — C884 Extranodal marginal zone B-cell lymphoma of mucosa-associated lymphoid tissue [MALT-lymphoma]: Secondary | ICD-10-CM | POA: Insufficient documentation

## 2022-12-04 DIAGNOSIS — C8588 Other specified types of non-Hodgkin lymphoma, lymph nodes of multiple sites: Secondary | ICD-10-CM | POA: Diagnosis not present

## 2022-12-04 NOTE — Progress Notes (Signed)
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   All questions were answered. The patient knows to call the clinic with any problems, questions or concerns. The total time spent in the appointment was 20 minutes encounter with patients including review of chart and various tests results, discussions about plan of care and coordination of care plan   Artis Delay, MD 12/04/2022 10:48 AM  INTERVAL HISTORY: Please see below for problem oriented charting. she returns for urgent evaluation Recently, she palpated a lump on the angle of her jaw on the left side near her parotid gland We tried to refer her back to her prior ENT but she could not be seen there Since then, the lump subsequently regressed She denies recent head and neck infection No other new lymphadenopathy  REVIEW OF SYSTEMS:   Constitutional: Denies fevers, chills or abnormal weight loss Eyes: Denies blurriness of vision Ears, nose, mouth, throat, and face: Denies mucositis or sore throat Respiratory: Denies cough, dyspnea or wheezes Cardiovascular: Denies palpitation, chest discomfort or lower  extremity swelling Gastrointestinal:  Denies nausea, heartburn or change in bowel habits Skin: Denies abnormal skin rashes Lymphatics: Denies new lymphadenopathy or easy bruising Neurological:Denies numbness, tingling or new weaknesses Behavioral/Psych: Mood is stable, no new changes  All other systems were reviewed with the patient and are negative.  I have reviewed the past medical history, past surgical history, social history and family history with the patient and they are unchanged from previous note.  ALLERGIES:  is allergic to lisinopril.  MEDICATIONS:  Current Outpatient Medications  Medication Sig Dispense Refill   acetaminophen (TYLENOL) 500 MG tablet Take 500 mg by mouth every 6 (six) hours as needed for moderate pain.     augmented betamethasone dipropionate (DIPROLENE-AF) 0.05 % cream Apply 1 a small amount to affected area twice a day     Cholecalciferol (VITAMIN D3) 50 MCG (2000 UT) TABS Take 2 tablets by mouth daily.     clotrimazole-betamethasone (LOTRISONE) cream Apply topically 2 (two) times daily.     hydrALAZINE (APRESOLINE) 50 MG tablet Take 1 tablet (50 mg total) by mouth in the morning and at bedtime. 12 HOURS APART 180 tablet 3   metoprolol tartrate (LOPRESSOR) 25 MG tablet TAKE 1 TABLET(25 MG) BY MOUTH TWICE DAILY AS NEEDED FOR PALPITATIONS 180 tablet 3   simvastatin (ZOCOR) 20 MG tablet Take 1 tablet (20 mg total) by mouth at bedtime. 90 tablet 3   valsartan (DIOVAN) 160 MG tablet Take 1 tablet (160 mg total) by mouth daily. 90 tablet 3   No current facility-administered medications for this visit.    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 Please refer to report for other findings   11/10/2015 Procedure   she underwent CT guided biopsy   11/10/2015 Pathology Results   Accession: [id number] right lung CT-guided biopsy suspicious for lymphoma   12/02/2015 - 12/23/2015 Chemotherapy   She received 4 doses of weekly rituximab.   02/10/2016 PET scan   There is no evidence for residual or recurrent hypermetabolic adenopathy   03/09/2016 Imaging   The masslike area of concern in the medial right lung base seen on the previous study is unchanged in size since March 2017. A biopsy was previously recommended after the CT scan from October 26, 2015. Please see that report for further recommendations as the finding was better characterized at that time. 2. No pulmonary emboli. 3. Multiple thin walled cysts in the mid and lower lungs is stable. 4. 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   02/06/2017 PET scan   No evidence of active lymphomatous involvement. Stable 1.5 cm mixed cystic/ solid nodule in the right lower lobe, non FDG avid. Continued attention on follow-up is suggested.   02/13/2017 Procedure   Colonoscopy - One 8 mm polyp in the cecum, removed with a cold snare. Resected and retrieved. - Two 3 to 4 mm polyps in the transverse colon, removed with a cold biopsy forceps. Resected and retrieved. - One 5 mm polyp in the descending colon, removed with a cold snare. Resected and retrieved. - Two 3 to 5 mm polyps at the recto-sigmoid colon, removed with a cold snare. Resected and retrieved. - Mild diverticulosis in the sigmoid colon. - Internal hemorrhoids.      02/06/2018 PET scan   Negative PET-CT. No findings for enlarged or hypermetabolic lymph nodes to suggest lymphoma.   02/24/2021 PET scan   1. Hypermetabolic nodules are nodes centered about the left parotid gland, most likely indicative of recurrent lymphoma-given clinical history. 2. No evidence of hypermetabolic adenopathy outside of the neck.  3. Similar  low-level hypermetabolism corresponding to an area of vague left lower lobe increased density. 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 4. Incidental findings, including: Coronary artery atherosclerosis. Aortic Atherosclerosis (ICD10-I70.0). Tiny hiatal hernia.     04/04/2021 Surgery   PREOPERATIVE DIAGNOSES: 1. Left parotid mass   POSTOPERATIVE DIAGNOSES: 1. Left parotid mass   PROCEDURE PERFORMED: Left lateral parotidectomy with facial nerve dissection     04/04/2021 Pathology Results   A. PAROTID GLAND, LEFT, PAROTIDECTOMY:  -  Lymphoepithelial sialadenitis  -  See comment   COMMENT:   The excision specimen consists of tissue submitted as left parotid.  It is composed of nodular lymphoepithelial lesions and interspersed fibrosis.  Cytokeratin AE1/3 and p40 highlight the epithelial component within the nodular lymphoid proliferations.  The lymphocytes are composed of an admixture of B and T cells, by CD20 and CD3, respectively.  The B cells do not aberrantly express CD5, CD23, CD43 or cyclin D1.  Scattered germinal centers with polarization are highlighted by CD10, BCL6, Bcl-2 (negative) and Ki-67.  Plasma cells are polytypic (CD138, kappa and lambda by in situ hybridization).  Flow cytometry performed on the sample (see [id number]), did not identify a monoclonal B or phenotypically aberrant T-cell population.  Overall, there is no definitive  evidence of a lymphoproliferative process in the examined material.      PHYSICAL EXAMINATION: ECOG PERFORMANCE STATUS: 0 - Asymptomatic  Vitals:   12/04/22 0931  BP: (!) 143/52  Pulse: 80  Resp: 18  Temp: 97.6 F (36.4 C)  SpO2: 97%   Filed Weights   12/04/22 0931  Weight: 201 lb 12.8 oz (91.5 kg)    GENERAL:alert, no distress and comfortable SKIN: skin color, texture, turgor are normal, no rashes or significant lesions EYES: normal, Conjunctiva are pink and non-injected, sclera clear OROPHARYNX:no  exudate, no erythema and lips, buccal mucosa, and tongue normal  NECK: supple, thyroid normal size, non-tender, without nodularity.  Mild tissue thickening near her parotid region but no abnormalities LYMPH:  no palpable lymphadenopathy in the cervical, axillary or inguinal LUNGS: clear to auscultation and percussion with normal breathing effort HEART: regular rate & rhythm and no murmurs and no lower extremity edema ABDOMEN:abdomen soft, non-tender and normal bowel sounds Musculoskeletal:no cyanosis of digits and no clubbing  NEURO: alert & oriented x 3 with fluent speech, no focal motor/sensory deficits  LABORATORY DATA:  I have reviewed the data as listed    Component Value Date/Time   NA 138 09/28/2022 1650   NA 139 08/10/2022 1046   NA 143 02/06/2017 1302   K 4.1 09/28/2022 1650   K 4.8 02/06/2017 1302   CL 103 09/28/2022 1650   CO2 24 09/28/2022 1650   CO2 26 02/06/2017 1302   GLUCOSE 96 09/28/2022 1650   GLUCOSE 100 02/06/2017 1302   BUN 17 09/28/2022 1650   BUN 11 08/10/2022 1046   BUN 14.2 02/06/2017 1302   CREATININE 0.65 09/28/2022 1650   CREATININE 0.8 02/06/2017 1302   CALCIUM 9.6 09/28/2022 1650   CALCIUM 9.7 02/06/2017 1302   PROT 7.4 09/28/2022 1650   PROT 6.9 05/29/2022 0905   PROT 7.8 02/06/2017 1302   ALBUMIN 4.4 05/29/2022 0905   ALBUMIN 3.9 02/06/2017 1302   AST 13 09/28/2022 1650   AST 13 (L) 04/10/2022 1026   AST 20 02/06/2017 1302   ALT 12 09/28/2022 1650   ALT 12 04/10/2022 1026   ALT 25 02/06/2017 1302   ALKPHOS 103 05/29/2022 0905   ALKPHOS 105 02/06/2017 1302   BILITOT 0.3 09/28/2022 1650   BILITOT 0.3 05/29/2022 0905   BILITOT 0.3 04/10/2022 1026   BILITOT 0.41 02/06/2017 1302   GFRNONAA >60 04/10/2022 1026   GFRAA >60 02/09/2020 0818    No results found for: "SPEP", "UPEP"  Lab Results  Component Value Date   WBC 6.4 09/28/2022   NEUTROABS 4,467 09/28/2022   HGB 11.3 (L) 09/28/2022   HCT 34.3 (L) 09/28/2022   MCV 87.7 09/28/2022    PLT 247 09/28/2022      Chemistry      Component Value Date/Time   NA 138 09/28/2022 1650   NA 139 08/10/2022 1046   NA 143 02/06/2017 1302   K 4.1 09/28/2022 1650   K 4.8 02/06/2017 1302   CL 103 09/28/2022 1650   CO2 24 09/28/2022 1650   CO2 26 02/06/2017 1302   BUN 17 09/28/2022 1650   BUN 11 08/10/2022 1046   BUN 14.2 02/06/2017 1302   CREATININE 0.65 09/28/2022 1650   CREATININE 0.8 02/06/2017 1302      Component Value Date/Time   CALCIUM 9.6 09/28/2022 1650   CALCIUM 9.7 02/06/2017 1302   ALKPHOS 103 05/29/2022 0905   ALKPHOS 105 02/06/2017 1302   AST 13 09/28/2022 1650  AST 13 (L) 04/10/2022 1026   AST 20 02/06/2017 1302   ALT 12 09/28/2022 1650   ALT 12 04/10/2022 1026   ALT 25 02/06/2017 1302   BILITOT 0.3 09/28/2022 1650   BILITOT 0.3 05/29/2022 0905   BILITOT 0.3 04/10/2022 1026   BILITOT 0.41 02/06/2017 1302

## 2022-12-04 NOTE — Assessment & Plan Note (Signed)
Careful examination of the head and neck region did not reveal any abnormal lymphadenopathy The patient is not symptomatic We discussed the risk and benefits of imaging study versus observation She favors observation I plan to see her back again in August as scheduled We discussed signs and symptoms to watch out for disease progression For now, we will hold off ENT referral

## 2022-12-05 ENCOUNTER — Ambulatory Visit (INDEPENDENT_AMBULATORY_CARE_PROVIDER_SITE_OTHER): Payer: PPO | Admitting: Family Medicine

## 2022-12-05 ENCOUNTER — Encounter: Payer: Self-pay | Admitting: Family Medicine

## 2022-12-05 VITALS — BP 140/62 | HR 78 | Temp 97.9°F | Ht 69.0 in | Wt 198.4 lb

## 2022-12-05 DIAGNOSIS — I2510 Atherosclerotic heart disease of native coronary artery without angina pectoris: Secondary | ICD-10-CM | POA: Diagnosis not present

## 2022-12-05 DIAGNOSIS — C8588 Other specified types of non-Hodgkin lymphoma, lymph nodes of multiple sites: Secondary | ICD-10-CM

## 2022-12-05 DIAGNOSIS — R7303 Prediabetes: Secondary | ICD-10-CM

## 2022-12-05 DIAGNOSIS — I2584 Coronary atherosclerosis due to calcified coronary lesion: Secondary | ICD-10-CM

## 2022-12-05 DIAGNOSIS — E782 Mixed hyperlipidemia: Secondary | ICD-10-CM

## 2022-12-05 DIAGNOSIS — E7800 Pure hypercholesterolemia, unspecified: Secondary | ICD-10-CM

## 2022-12-05 DIAGNOSIS — I10 Essential (primary) hypertension: Secondary | ICD-10-CM

## 2022-12-05 LAB — CBC WITH DIFFERENTIAL/PLATELET
Basophils Absolute: 0 10*3/uL (ref 0.0–0.1)
Basophils Relative: 0.7 % (ref 0.0–3.0)
Eosinophils Absolute: 0.2 10*3/uL (ref 0.0–0.7)
Eosinophils Relative: 2.8 % (ref 0.0–5.0)
HCT: 35.7 % — ABNORMAL LOW (ref 36.0–46.0)
Hemoglobin: 12 g/dL (ref 12.0–15.0)
Lymphocytes Relative: 14.9 % (ref 12.0–46.0)
Lymphs Abs: 1 10*3/uL (ref 0.7–4.0)
MCHC: 33.5 g/dL (ref 30.0–36.0)
MCV: 88.5 fl (ref 78.0–100.0)
Monocytes Absolute: 0.7 10*3/uL (ref 0.1–1.0)
Monocytes Relative: 10.4 % (ref 3.0–12.0)
Neutro Abs: 4.6 10*3/uL (ref 1.4–7.7)
Neutrophils Relative %: 71.2 % (ref 43.0–77.0)
Platelets: 230 10*3/uL (ref 150.0–400.0)
RBC: 4.04 Mil/uL (ref 3.87–5.11)
RDW: 13.4 % (ref 11.5–15.5)
WBC: 6.5 10*3/uL (ref 4.0–10.5)

## 2022-12-05 LAB — COMPREHENSIVE METABOLIC PANEL
ALT: 9 U/L (ref 0–35)
AST: 12 U/L (ref 0–37)
Albumin: 4.1 g/dL (ref 3.5–5.2)
Alkaline Phosphatase: 77 U/L (ref 39–117)
BUN: 14 mg/dL (ref 6–23)
CO2: 28 mEq/L (ref 19–32)
Calcium: 9.2 mg/dL (ref 8.4–10.5)
Chloride: 104 mEq/L (ref 96–112)
Creatinine, Ser: 0.63 mg/dL (ref 0.40–1.20)
GFR: 82.91 mL/min (ref >60.00)
Glucose, Bld: 90 mg/dL (ref 70–99)
Potassium: 4.3 mEq/L (ref 3.5–5.1)
Sodium: 140 mEq/L (ref 135–145)
Total Bilirubin: 0.5 mg/dL (ref 0.2–1.2)
Total Protein: 7 g/dL (ref 6.0–8.3)

## 2022-12-05 LAB — LIPID PANEL
Cholesterol: 165 mg/dL (ref 0–200)
HDL: 56.7 mg/dL (ref >39.00)
LDL Cholesterol: 81 mg/dL (ref 0–99)
NonHDL: 108.32
Total CHOL/HDL Ratio: 3
Triglycerides: 136 mg/dL (ref 0.0–149.0)
VLDL: 27.2 mg/dL (ref 0.0–40.0)

## 2022-12-05 LAB — HEMOGLOBIN A1C: Hgb A1c MFr Bld: 5.9 % (ref 4.6–6.5)

## 2022-12-05 NOTE — Assessment & Plan Note (Signed)
Chronic.  Not ideal on simvastatin 20 mg.  Tricor was added, but stopped due to "pill burden".  Check lipids,cmp

## 2022-12-05 NOTE — Assessment & Plan Note (Signed)
Chronic.  Possibly new area on left jaw.  Managed by oncology

## 2022-12-05 NOTE — Assessment & Plan Note (Signed)
Chronic.  Controlled.  Continue hydralazine 50 mg twice daily and valsartan 160 mg daily.  Check CMP,cbc

## 2022-12-05 NOTE — Assessment & Plan Note (Signed)
Chronic  Poss new spot on left.  Followed w/oncology

## 2022-12-05 NOTE — Assessment & Plan Note (Signed)
Chronic.  Controlled.  Going on long time.  Work on diet/exercise

## 2022-12-05 NOTE — Patient Instructions (Signed)
It was very nice to see you today!  Get Tetanus, Diphtheria, and Pertussis (Tdap) shot at pharmacy  PLEASE NOTE:  If you had any lab tests please let us know if you have not heard back within a few days. You may see your results on MyChart before we have a chance to review them but we will give you a call once they are reviewed by [place]. If we ordered any referrals today, please let us know if you have not heard from their office within the next week.   Please try these tips to maintain a healthy lifestyle:  Eat most of your calories during the day when you are active. Eliminate processed foods including packaged sweets (pies, cakes, cookies), reduce intake of potatoes, white bread, white pasta, and white rice. Look for whole grain options, oat flour or almond flour.  Each meal should contain half fruits/vegetables, one quarter protein, and one quarter carbs (no bigger than a computer mouse).  Cut down on sweet beverages. This includes juice, soda, and sweet tea. Also watch fruit intake, though this is a healthier sweet option, it still contains natural sugar! Limit to 3 servings daily.  Drink at least 1 glass of water with each meal and aim for at least 8 glasses per day  Exercise at least 150 minutes every week.

## 2022-12-05 NOTE — Progress Notes (Signed)
Subjective:     Patient ID: [name], female    DOB: April 01, 1941, 82 y.o.   MRN: [id number]  Chief Complaint  Patient presents with   Medical Management of Chronic Issues    6 month follow-up on HTN Fasting     HPI  HYPERTENSION-Pt is on hydralazine 50bid and lopressor 25 mg twice dailyPRN palpitations),valsartan 160. Misses dose of hydralazine in am as gets up late. Bp's running not checking  No ha/dizziness/palp/edema/cough/shortness of breath.  Occasional chest pain-doesn't have appointment till 7/12-can be sharp, or different types of pain.  Intermitt for few minutes or hours.   HLD-on simvastatin 20 mg PreDM-stable for years.  Mediterranean diet but dessert as well. Lymphoma-right neck.  Then left started 1 month(s) ago-saw onc yesterday.  Dr. Hale Drone) no longer does surgery.  Will need new ENT-onc managing.   Health Maintenance Due  Topic Date Due   DTaP/Tdap/Td (2 - Tdap) 04/29/2016   HEMOGLOBIN A1C  02/21/2022    Past Medical History:  Diagnosis Date   Anxiety    Arthritis    right thumb [facility] arthritis   Asthmatic bronchitis 2017   Cancer    -NHL R parotid   Cataract    bilateral small- removed both eyes    Colon cancer    stage 1- at appendiceal orifice    Complication of anesthesia    has dry mouth anyway from shograns-sore throats post op   Coronary artery calcification seen on CAT scan 11/14/2019   Depression    since 1982    Diverticulosis    Fibromyalgia    GERD (gastroesophageal reflux disease)    past hx    Heart murmur    Hyperlipidemia    controlled - metabolic syndrome per pt- goes up and down    Hypertension    Lung infiltrate on CT 09/14/2015   Meningioma 11/2009   right post parietal 16mm - stable 11/2009 MRI   Nephrolithiasis    Nephrolithiasis    Neuromuscular disorder    fibromyalgia   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 08/31/2015   Pure hypercholesterolemia 11/14/2019   Sjogren's disease     Past Surgical  History:  Procedure Laterality Date   ABDOMINAL HYSTERECTOMY     APPENDECTOMY  2008   ARTERY BIOPSY  02/03/2013   Procedure: MINOR BIOPSY TEMPORAL ARTERY;  Surgeon: Darletta Moll, MD;  Location: [facility];  Service: ENT;;   COLONOSCOPY     KIDNEY STONE SURGERY  1974   LEFT HEART CATH AND CORONARY ANGIOGRAPHY N/A 12/01/2019   Procedure: LEFT HEART CATH AND CORONARY ANGIOGRAPHY;  Surgeon: Lennette Bihari, MD;  Location: [facility];  Service: Cardiovascular;  Laterality: N/A;   MASS EXCISION  2008   abd-benign   PAROTIDECTOMY Right 08/01/2015   Procedure: RIGHT PAROTIDECTOMY;  Surgeon: Newman Pies, MD;  Location: [facility];  Service: ENT;  Laterality: Right;   PAROTIDECTOMY Left 04/04/2021   Procedure: LEFT PAROTIDECTOMY;  Surgeon: Newman Pies, MD;  Location: [facility];  Service: ENT;  Laterality: Left;   POLYPECTOMY     TONSILLECTOMY     VAGINAL HYSTERECTOMY  1989    Outpatient Medications Prior to Visit  Medication Sig Dispense Refill   acetaminophen (TYLENOL) 500 MG tablet Take 500 mg by mouth every 6 (six) hours as needed for moderate pain.     augmented betamethasone dipropionate (DIPROLENE-AF) 0.05 % cream Apply 1 a small amount to affected area  twice a day     Cholecalciferol (VITAMIN D3) 50 MCG (2000 UT) TABS Take 2 tablets by mouth daily.     clotrimazole-betamethasone (LOTRISONE) cream Apply topically 2 (two) times daily.     hydrALAZINE (APRESOLINE) 50 MG tablet Take 1 tablet (50 mg total) by mouth in the morning and at bedtime. 12 HOURS APART 180 tablet 3   metoprolol tartrate (LOPRESSOR) 25 MG tablet TAKE 1 TABLET(25 MG) BY MOUTH TWICE DAILY AS NEEDED FOR PALPITATIONS (Patient taking differently: as needed.) 180 tablet 3   simvastatin (ZOCOR) 20 MG tablet Take 1 tablet (20 mg total) by mouth at bedtime. 90 tablet 3   valsartan (DIOVAN) 160 MG tablet Take 1 tablet (160 mg total) by mouth daily. 90 tablet 3   No facility-administered  medications prior to visit.    Allergies  Allergen Reactions   Lisinopril Cough   ROS neg/noncontributory except as noted HPI/below Woke up 3am w/left sciatica-long time-usu occurs on left side, but this time on back-lasted 1 heart rate.  Patient more concerned/worrried/distraught by sister's declining health, so puts off hers      Objective:     BP (!) 140/62 (BP Location: Left Arm, Patient Position: Sitting, Cuff Size: Normal)   Pulse 78   Temp 97.9 F (36.6 C) (Temporal)   Ht 5\' 9"  (1.753 m)   Wt 198 lb 6 oz (90 kg)   SpO2 96%   BMI 29.29 kg/m  Wt Readings from Last 3 Encounters:  12/05/22 198 lb 6 oz (90 kg)  12/04/22 201 lb 12.8 oz (91.5 kg)  09/28/22 202 lb 4 oz (91.7 kg)    Physical Exam   Gen: WDWN NAD HEENT: NCAT, conjunctiva not injected, sclera nonicteric NECK:  supple, no thyromegaly, no nodes, no carotid bruits.  Firmness left angle jaw CARDIAC: RRR, S1S2+, +1-2/6 murmur. DP 2+B LUNGS: CTAB. No wheezes ABDOMEN:  BS+, soft, NTND, No HSM, no masses EXT:  no edema MSK: no gross abnormalities.  NEURO: A&O x3.  CN II-XII intact.  PSYCH: normal mood. Good eye contact     Assessment & Plan:   Problem List Items Addressed This Visit       Cardiovascular and Mediastinum   Essential hypertension - Primary    Chronic.  Controlled.  Continue hydralazine 50 mg twice daily and valsartan 160 mg daily.  Check CMP,cbc      Relevant Orders   CBC with Differential/Platelet   Comprehensive metabolic panel   Lipid panel   Coronary artery calcification    Chronic.  Some atypical CP-followed by Card.  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  Followed w/oncology        Other   Mixed hyperlipidemia    Chronic.  Not ideal on simvastatin 20 mg.  Tricor was added, but stopped due to "pill burden".  Check lipids,cmp      Pure  hypercholesterolemia   Relevant Orders   Comprehensive metabolic panel   Lipid panel   Prediabetes    Chronic.  Controlled.  Going on long time.  Work on diet/exercise      Relevant Orders   Comprehensive metabolic panel   Hemoglobin A1c  Follow up 6 mo  No orders of the defined types were placed in this encounter.   Angelena Sole, MD

## 2022-12-05 NOTE — Assessment & Plan Note (Signed)
Chronic.  Some atypical CP-followed by Card.  If worsening, changing, get sooner appt

## 2022-12-06 ENCOUNTER — Telehealth: Payer: Self-pay | Admitting: Family Medicine

## 2022-12-06 NOTE — Telephone Encounter (Signed)
Copied from CRM [id number]. Topic: Medicare AWV >> Dec 06, 2022 11:48 AM Gwenith Spitz wrote: Reason for CRM: Called patient to schedule Medicare Annual Wellness Visit (AWV). Left message for patient to call back and schedule Medicare Annual Wellness Visit (AWV).  Last date of AWV: 03/15/2022  Please schedule an appointment at any time with Inetta Fermo, [facility]. Please schedule AWVS with Inetta Fermo, [facility].  If any questions, please contact me at [phone].  Thank you ,  Gabriel Cirri [facility] AWV TEAM Direct Dial [phone]

## 2022-12-11 ENCOUNTER — Telehealth: Payer: Self-pay | Admitting: Family Medicine

## 2022-12-11 NOTE — Telephone Encounter (Signed)
Contacted [name] to schedule their annual wellness visit. Appointment made for 12/18/2022.  Gabriel Cirri [facility] AWV TEAM Direct Dial [phone]

## 2022-12-18 ENCOUNTER — Ambulatory Visit (INDEPENDENT_AMBULATORY_CARE_PROVIDER_SITE_OTHER): Payer: PPO

## 2022-12-18 VITALS — Wt 198.0 lb

## 2022-12-18 DIAGNOSIS — Z0000 Encounter for general adult medical examination without abnormal findings: Secondary | ICD-10-CM | POA: Diagnosis not present

## 2022-12-18 NOTE — Patient Instructions (Signed)
[name] , Thank you for taking time to come for your Medicare Wellness Visit. I appreciate your ongoing commitment to your health goals. Please review the following plan we discussed and let me know if I can assist you in the future.   These are the goals we discussed:  Goals      Patient Stated     None at this time      Patient Stated     None at this time         This is a list of the screening recommended for you and due dates:  Health Maintenance  Topic Date Due   DTaP/Tdap/Td vaccine (2 - Tdap) 04/29/2016   Pneumonia Vaccine (4 of 4 - PPSV23 or PCV20) 04/01/2023*   Zoster (Shingles) Vaccine (2 of 2) 04/02/2023*   COVID-19 Vaccine (6 - 2023-24 season) 04/07/2023*   Yearly kidney health urinalysis for diabetes  06/05/2027*   Colon Cancer Screening  03/05/2023   Mammogram  03/07/2023   Flu Shot  03/21/2023   Hemoglobin A1C  06/06/2023   Yearly kidney function blood test for diabetes  12/05/2023   Medicare Annual Wellness Visit  12/18/2023   DEXA scan (bone density measurement)  Completed   HPV Vaccine  Aged Out   Complete foot exam   Discontinued   Eye exam for diabetics  Discontinued  *Topic was postponed. The date shown is not the original due date.    Advanced directives: Please bring a copy of your health care power of attorney and living will to the office at your convenience.  Conditions/risks identified: none at this time   Next appointment: Follow up in one year for your annual wellness visit    Preventive Care 65 Years and Older, Female Preventive care refers to lifestyle choices and visits with your health care provider that can promote health and wellness. What does preventive care include? A yearly physical exam. This is also called an annual well check. Dental exams once or twice a year. Routine eye exams. Ask your health care provider how often you should have your eyes checked. Personal lifestyle choices, including: Daily care of your teeth and  gums. Regular physical activity. Eating a healthy diet. Avoiding tobacco and drug use. Limiting alcohol use. Practicing safe sex. Taking low-dose aspirin every day. Taking vitamin and mineral supplements as recommended by your health care provider. What happens during an annual well check? The services and screenings done by your health care provider during your annual well check will depend on your age, overall health, lifestyle risk factors, and family history of disease. Counseling  Your health care provider may ask you questions about your: Alcohol use. Tobacco use. Drug use. Emotional well-being. Home and relationship well-being. Sexual activity. Eating habits. History of falls. Memory and ability to understand (cognition). Work and work Astronomer. Reproductive health. Screening  You may have the following tests or measurements: Height, weight, and BMI. Blood pressure. Lipid and cholesterol levels. These may be checked every 5 years, or more frequently if you are over 63 years old. Skin check. Lung cancer screening. You may have this screening every year starting at age 35 if you have a 30-pack-year history of smoking and currently smoke or have quit within the past 15 years. Fecal occult blood test (FOBT) of the stool. You may have this test every year starting at age 46. Flexible sigmoidoscopy or colonoscopy. You may have a sigmoidoscopy every 5 years or a colonoscopy every 10 years starting at  age 48. Hepatitis C blood test. Hepatitis B blood test. Sexually transmitted disease (STD) testing. Diabetes screening. This is done by checking your blood sugar (glucose) after you have not eaten for a while (fasting). You may have this done every 1-3 years. Bone density scan. This is done to screen for osteoporosis. You may have this done starting at age 73. Mammogram. This may be done every 1-2 years. Talk to your health care provider about how often you should have regular  mammograms. Talk with your health care provider about your test results, treatment options, and if necessary, the need for more tests. Vaccines  Your health care provider may recommend certain vaccines, such as: Influenza vaccine. This is recommended every year. Tetanus, diphtheria, and acellular pertussis (Tdap, Td) vaccine. You may need a Td booster every 10 years. Zoster vaccine. You may need this after age 52. Pneumococcal 13-valent conjugate (PCV13) vaccine. One dose is recommended after age 58. Pneumococcal polysaccharide (PPSV23) vaccine. One dose is recommended after age 74. Talk to your health care provider about which screenings and vaccines you need and how often you need them. This information is not intended to replace advice given to you by your health care provider. Make sure you discuss any questions you have with your health care provider. Document Released: 09/02/2015 Document Revised: 04/25/2016 Document Reviewed: 06/07/2015 Elsevier Interactive Patient Education  2017 [place] Prevention in the Home Falls can cause injuries. They can happen to people of all ages. There are many things you can do to make your home safe and to help prevent falls. What can I do on the outside of my home? Regularly fix the edges of walkways and driveways and fix any cracks. Remove anything that might make you trip as you walk through a door, such as a raised step or threshold. Trim any bushes or trees on the path to your home. Use bright outdoor lighting. Clear any walking paths of anything that might make someone trip, such as rocks or tools. Regularly check to see if handrails are loose or broken. Make sure that both sides of any steps have handrails. Any raised decks and porches should have guardrails on the edges. Have any leaves, snow, or ice cleared regularly. Use sand or salt on walking paths during winter. Clean up any spills in your garage right away. This includes oil  or grease spills. What can I do in the bathroom? Use night lights. Install grab bars by the toilet and in the tub and shower. Do not use towel bars as grab bars. Use non-skid mats or decals in the tub or shower. If you need to sit down in the shower, use a plastic, non-slip stool. Keep the floor dry. Clean up any water that spills on the floor as soon as it happens. Remove soap buildup in the tub or shower regularly. Attach bath mats securely with double-sided non-slip rug tape. Do not have throw rugs and other things on the floor that can make you trip. What can I do in the bedroom? Use night lights. Make sure that you have a light by your bed that is easy to reach. Do not use any sheets or blankets that are too big for your bed. They should not hang down onto the floor. Have a firm chair that has side arms. You can use this for support while you get dressed. Do not have throw rugs and other things on the floor that can make you trip. What can I  do in the kitchen? Clean up any spills right away. Avoid walking on wet floors. Keep items that you use a lot in easy-to-reach places. If you need to reach something above you, use a strong step stool that has a grab bar. Keep electrical cords out of the way. Do not use floor polish or wax that makes floors slippery. If you must use wax, use non-skid floor wax. Do not have throw rugs and other things on the floor that can make you trip. What can I do with my stairs? Do not leave any items on the stairs. Make sure that there are handrails on both sides of the stairs and use them. Fix handrails that are broken or loose. Make sure that handrails are as long as the stairways. Check any carpeting to make sure that it is firmly attached to the stairs. Fix any carpet that is loose or worn. Avoid having throw rugs at the top or bottom of the stairs. If you do have throw rugs, attach them to the floor with carpet tape. Make sure that you have a light  switch at the top of the stairs and the bottom of the stairs. If you do not have them, ask someone to add them for you. What else can I do to help prevent falls? Wear shoes that: Do not have high heels. Have rubber bottoms. Are comfortable and fit you well. Are closed at the toe. Do not wear sandals. If you use a stepladder: Make sure that it is fully opened. Do not climb a closed stepladder. Make sure that both sides of the stepladder are locked into place. Ask someone to hold it for you, if possible. Clearly mark and make sure that you can see: Any grab bars or handrails. First and last steps. Where the edge of each step is. Use tools that help you move around (mobility aids) if they are needed. These include: Canes. Walkers. Scooters. Crutches. Turn on the lights when you go into a dark area. Replace any light bulbs as soon as they burn out. Set up your furniture so you have a clear path. Avoid moving your furniture around. If any of your floors are uneven, fix them. If there are any pets around you, be aware of where they are. Review your medicines with your doctor. Some medicines can make you feel dizzy. This can increase your chance of falling. Ask your doctor what other things that you can do to help prevent falls. This information is not intended to replace advice given to you by your health care provider. Make sure you discuss any questions you have with your health care provider. Document Released: 06/02/2009 Document Revised: 01/12/2016 Document Reviewed: 09/10/2014 [employer] Interactive Patient Education  2017 [employer] at this time

## 2022-12-18 NOTE — Progress Notes (Signed)
I connected with  [name] on 12/18/22 by a audio enabled telemedicine application and verified that I am speaking with the correct person using two identifiers.  Patient Location: Home  Provider Location: Office/Clinic  I discussed the limitations of evaluation and management by telemedicine. The patient expressed understanding and agreed to proceed.   Subjective:   [name] is a 82 y.o. female who presents for Medicare Annual (Subsequent) preventive examination.  Review of Systems     Cardiac Risk Factors include: advanced age (>20men, >78 women);dyslipidemia;hypertension     Objective:    Today's Vitals   12/18/22 1503  Weight: 198 lb (89.8 kg)   Body mass index is 29.24 kg/m.     12/18/2022    3:11 PM 03/15/2022    3:25 PM 04/04/2021    8:06 AM 03/27/2021    2:21 PM 12/22/2020    9:40 AM 12/01/2019    9:13 AM 02/19/2017    9:44 AM  Advanced Directives  Does Patient Have a Medical Advance Directive? Yes Yes Yes Yes Yes Yes Yes  Type of Estate agent of [place];Living will Living will Healthcare Power of [place];Living will Healthcare Power of [place];Living will Healthcare Power of [place];Living will Healthcare Power of [place];Living will Living will;Healthcare Power of Attorney  Does patient want to make changes to medical advance directive?    No - Patient declined  No - Patient declined   Copy of Healthcare Power of Attorney in Chart? No - copy requested   No - copy requested No - copy requested No - copy requested No - copy requested    Current Medications (verified) Outpatient Encounter Medications as of 12/18/2022  Medication Sig   acetaminophen (TYLENOL) 500 MG tablet Take 500 mg by mouth every 6 (six) hours as needed for moderate pain.   augmented betamethasone dipropionate (DIPROLENE-AF) 0.05 % cream Apply 1 a small amount to affected area twice a day   Calcium Carbonate (CALCIUM 500 PO) Take by mouth.    clotrimazole-betamethasone (LOTRISONE) cream Apply topically 2 (two) times daily.   hydrALAZINE (APRESOLINE) 50 MG tablet Take 1 tablet (50 mg total) by mouth in the morning and at bedtime. 12 HOURS APART   metoprolol tartrate (LOPRESSOR) 25 MG tablet TAKE 1 TABLET(25 MG) BY MOUTH TWICE DAILY AS NEEDED FOR PALPITATIONS (Patient taking differently: as needed.)   simvastatin (ZOCOR) 20 MG tablet Take 1 tablet (20 mg total) by mouth at bedtime.   valsartan (DIOVAN) 160 MG tablet Take 1 tablet (160 mg total) by mouth daily.   Cholecalciferol (VITAMIN D3) 50 MCG (2000 UT) TABS Take 2 tablets by mouth daily. (Patient not taking: Reported on 12/18/2022)   No facility-administered encounter medications on file as of 12/18/2022.    Allergies (verified) Lisinopril   History: Past Medical History:  Diagnosis Date   Anxiety    Arthritis    right thumb [facility] arthritis   Asthmatic bronchitis 2017   Cancer (HCC)    -NHL R parotid   Cataract    bilateral small- removed both eyes    Colon cancer (HCC)    stage 1- at appendiceal orifice    Complication of anesthesia    has dry mouth anyway from shograns-sore throats post op   Coronary artery calcification seen on CAT scan 11/14/2019   Depression    since 1982    Diverticulosis    Fibromyalgia    GERD (gastroesophageal reflux disease)    past hx    Heart murmur  Hyperlipidemia    controlled - metabolic syndrome per pt- goes up and down    Hypertension    Lung infiltrate on CT 09/14/2015   Meningioma (HCC) 11/2009   right post parietal 16mm - stable 11/2009 MRI   Nephrolithiasis    Nephrolithiasis    Neuromuscular disorder (HCC)    fibromyalgia   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   Pure hypercholesterolemia 11/14/2019   Sjogren's disease (HCC)    Past Surgical History:  Procedure Laterality Date   ABDOMINAL HYSTERECTOMY     APPENDECTOMY  2008   ARTERY BIOPSY  02/03/2013   Procedure: MINOR BIOPSY TEMPORAL  ARTERY;  Surgeon: Darletta Moll, MD;  Location: [facility];  Service: ENT;;   COLONOSCOPY     KIDNEY STONE SURGERY  1974   LEFT HEART CATH AND CORONARY ANGIOGRAPHY N/A 12/01/2019   Procedure: LEFT HEART CATH AND CORONARY ANGIOGRAPHY;  Surgeon: Lennette Bihari, MD;  Location: [facility];  Service: Cardiovascular;  Laterality: N/A;   MASS EXCISION  2008   abd-benign   PAROTIDECTOMY Right 08/01/2015   Procedure: RIGHT PAROTIDECTOMY;  Surgeon: Newman Pies, MD;  Location: [facility];  Service: ENT;  Laterality: Right;   PAROTIDECTOMY Left 04/04/2021   Procedure: LEFT PAROTIDECTOMY;  Surgeon: Newman Pies, MD;  Location:  SURGERY CENTER;  Service: ENT;  Laterality: Left;   POLYPECTOMY     TONSILLECTOMY     VAGINAL HYSTERECTOMY  1989   Family History  Problem Relation Age of Onset   Colon cancer Mother 53   Colon cancer Father        unknown when onset was   Suicidality Son    Diabetes Neg Hx    Stomach cancer Neg Hx    Colon polyps Neg Hx    Esophageal cancer Neg Hx    Rectal cancer Neg Hx    Social History   Socioeconomic History   Marital status: Divorced    Spouse name: Not on file   Number of children: 2   Years of education: College   Highest education level: Not on file  Occupational History   Occupation: Retired    Associate Professor: UNEMPLOYED  Tobacco Use   Smoking status: Never   Smokeless tobacco: Never   Tobacco comments:    Passive exposure through her mother.  Vaping Use   Vaping Use: Never used  Substance and Sexual Activity   Alcohol use: Yes    Alcohol/week: 0.0 standard drinks of alcohol    Comment: Rarely   Drug use: No   Sexual activity: Not Currently    Comment: MPOA Juan Quam sister.  Other Topics Concern   Not on file  Social History Narrative   Pt lives at home alone.   Caffeine Use: 2 mugs every other day.       [facility]:   Originally from [place], [place]. She has also lived in [place], [place], & [place].  Internationally she has been to [place], [place], [place], [place], & [place]. Previously has worked in Airline pilot. No chemical or fume exposures. No pets currently. No bird, mold, or hot tub exposure. 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    Social Determinants of Health   Financial Resource Strain: Low Risk  (12/18/2022)   Overall Financial Resource Strain (CARDIA)    Difficulty of Paying Living Expenses: Not hard at all  Food Insecurity: No Food Insecurity (12/18/2022)  Hunger Vital Sign    Worried About Running Out of Food in the Last Year: Never true    Ran Out of Food in the Last Year: Never true  Transportation Needs: No Transportation Needs (12/18/2022)   PRAPARE - Administrator, Civil Service (Medical): No    Lack of Transportation (Non-Medical): No  Physical Activity: Inactive (12/18/2022)   Exercise Vital Sign    Days of Exercise per Week: 0 days    Minutes of Exercise per Session: 0 min  Stress: Stress Concern Present (12/18/2022)   [employer] of Occupational Health - Occupational Stress Questionnaire    Feeling of Stress : To some extent  Social Connections: Socially Isolated (12/18/2022)   Social Connection and Isolation Panel [NHANES]    Frequency of Communication with Friends and Family: More than three times a week    Frequency of Social Gatherings with Friends and Family: More than three times a week    Attends Religious Services: Never    Database administrator or Organizations: No    Attends Banker Meetings: Never    Marital Status: Divorced    Tobacco Counseling Counseling given: Not Answered Tobacco comments: Passive exposure through her mother.   Clinical Intake:  Pre-visit preparation completed: Yes  Pain : No/denies pain     BMI - recorded: 29.24 Nutritional Status: BMI 25 -29 Overweight Nutritional Risks: None Diabetes: No  How often do you need to have someone help  you when you read instructions, pamphlets, or other written materials from your doctor or pharmacy?: 1 - Never  Diabetic?no  Interpreter Needed?: No  Information entered by :: Lanier Ensign, LPN   Activities of Daily Living    12/18/2022    3:11 PM 03/15/2022    3:26 PM  In your present state of health, do you have any difficulty performing the following activities:  Hearing? 0 0  Vision? 0 0  Difficulty concentrating or making decisions? 0 0  Walking or climbing stairs? 0 0  Dressing or bathing? 0 0  Doing errands, shopping? 0 0  Preparing Food and eating ? N N  Using the Toilet? N N  In the past six months, have you accidently leaked urine? N N  Do you have problems with loss of bowel control? N N  Managing your Medications? N N  Managing your Finances? N N  Housekeeping or managing your Housekeeping? N N    Patient Care Team: Jeani Sow, MD as PCP - General (Family Medicine) Chilton Si, MD as PCP - Cardiology (Cardiology) Newman Pies, MD as Consulting Physician (Otolaryngology) Mardella Layman, MD as Consulting Physician (Gastroenterology) Artis Delay, MD as Consulting Physician (Hematology and Oncology)  Indicate any recent Medical Services you may have received from other than [facility] providers in the past year (date may be approximate).     Assessment:   This is a routine wellness examination for [facility].  Hearing/Vision screen Hearing Screening - Comments:: Pt denies any hearing issues  Vision Screening - Comments:: Pt follows up with Dr Charlotte Sanes for annual eye exams   Dietary issues and exercise activities discussed: Current Exercise Habits: The patient does not participate in regular exercise at present   Goals Addressed             This Visit's Progress    Patient Stated       None at this time        Depression Screen    12/18/2022  3:07 PM 09/28/2022    4:17 PM 06/04/2022   11:09 AM 06/04/2022   11:08 AM 03/15/2022    3:23 PM  08/23/2021    9:22 AM 05/14/2013    2:55 PM  PHQ 2/9 Scores  PHQ - 2 Score 4 4 0 6 2 4  0  PHQ- 9 Score 9 9   8 10      Fall Risk    12/18/2022    3:11 PM 09/28/2022    4:16 PM 06/04/2022   11:09 AM 03/15/2022    3:26 PM 08/23/2021    9:13 AM  Fall Risk   Falls in the past year? 0 0 0 0 0  Number falls in past yr: 0 0 0 0 0  Injury with Fall? 0 0 0 0 0  Risk for fall due to : Impaired vision No Fall Risks  Impaired vision   Follow up Falls prevention discussed Falls evaluation completed  Falls prevention discussed     FALL RISK PREVENTION PERTAINING TO THE HOME:  Any stairs in or around the home? No  If so, are there any without handrails? No  Home free of loose throw rugs in walkways, pet beds, electrical cords, etc? Yes  Adequate lighting in your home to reduce risk of falls? Yes   ASSISTIVE DEVICES UTILIZED TO PREVENT FALLS:  Life alert? No  Use of a cane, walker or w/c? No  Grab bars in the bathroom? Yes  Shower chair or bench in shower? No  Elevated toilet seat or a handicapped toilet? No   TIMED UP AND GO:  Was the test performed? No .   Cognitive Function:        12/18/2022    3:12 PM 03/15/2022    3:27 PM  6CIT Screen  What Year? 0 points 0 points  What month? 0 points 0 points  What time? 0 points 0 points  Count back from 20 0 points 0 points  Months in reverse 0 points 0 points  Repeat phrase 0 points 0 points  Total Score 0 points 0 points    Immunizations Immunization History  Administered Date(s) Administered   Influenza Split 05/15/2012, 04/28/2014, 05/21/2015   Influenza Whole 05/21/2007, 05/19/2008, 06/01/2010   Influenza, High Dose Seasonal PF 05/09/2021   Influenza,inj,Quad PF,6+ Mos 05/01/2016, 05/10/2017, 05/13/2018, 05/07/2019, 05/12/2020   Influenza,inj,quad, With Preservative 06/14/2015   PFIZER(Purple Top)SARS-COV-2 Vaccination 10/03/2019, 10/25/2019, 06/28/2020, 11/18/2020, 05/05/2021   Pfizer Covid-19 Vaccine Bivalent Booster 53yrs &  up 05/05/2021   Pneumococcal Conjugate-13 03/01/2015   Pneumococcal Polysaccharide-23 08/20/1998, 02/22/1999   Td 04/29/2006   Zoster Recombinat (Shingrix) 06/14/2020   Zoster, Live 06/23/2007   Zoster, Unspecified 04/13/2000    TDAP status: Due, Education has been provided regarding the importance of this vaccine. Advised may receive this vaccine at local pharmacy or Health Dept. Aware to provide a copy of the vaccination record if obtained from local pharmacy or Health Dept. Verbalized acceptance and understanding.  Flu Vaccine status: Up to date  Pneumococcal vaccine status: Up to date  Covid-19 vaccine status: Completed vaccines  Qualifies for Shingles Vaccine? Yes   Zostavax completed Yes   Shingrix Completed?: Yes  Screening Tests Health Maintenance  Topic Date Due   DTaP/Tdap/Td (2 - Tdap) 04/29/2016   Pneumonia Vaccine 58+ Years old (4 of 4 - PPSV23 or PCV20) 04/01/2023 (Originally 02/29/2016)   Zoster Vaccines- Shingrix (2 of 2) 04/02/2023 (Originally 08/09/2020)   COVID-19 Vaccine (6 - 2023-24 season) 04/07/2023 (Originally 04/20/2022)   Diabetic  kidney evaluation - Urine ACR  06/05/2027 (Originally 01/05/1959)   COLONOSCOPY (Pts 45-47yrs Insurance coverage will need to be confirmed)  03/05/2023   MAMMOGRAM  03/07/2023   INFLUENZA VACCINE  03/21/2023   HEMOGLOBIN A1C  06/06/2023   Diabetic kidney evaluation - eGFR measurement  12/05/2023   Medicare Annual Wellness (AWV)  12/18/2023   DEXA SCAN  Completed   HPV VACCINES  Aged Out   FOOT EXAM  Discontinued   OPHTHALMOLOGY EXAM  Discontinued    Health Maintenance  Health Maintenance Due  Topic Date Due   DTaP/Tdap/Td (2 - Tdap) 04/29/2016    Colorectal cancer screening: Type of screening: Colonoscopy. Completed 03/04/20. Repeat every 3 years  Mammogram status: Completed 03/06/22. Repeat every year  Bone Density status: Completed 10/10/20. Results reflect: Bone density results: OSTEOPENIA. Repeat every 2  years.   Additional Screening:   Vision Screening: Recommended annual ophthalmology exams for early detection of glaucoma and other disorders of the eye. Is the patient up to date with their annual eye exam?  Yes  Who is the provider or what is the name of the office in which the patient attends annual eye exams? Dr Charlotte Sanes  If pt is not established with a provider, would they like to be referred to a provider to establish care? No .   Dental Screening: Recommended annual dental exams for proper oral hygiene  Community Resource Referral / Chronic Care Management: CRR required this visit?  No   CCM required this visit?  No      Plan:     I have personally reviewed and noted the following in the patient's chart:   Medical and social history Use of alcohol, tobacco or illicit drugs  Current medications and supplements including opioid prescriptions. Patient is not currently taking opioid prescriptions. Functional ability and status Nutritional status Physical activity Advanced directives List of other physicians Hospitalizations, surgeries, and ER visits in previous 12 months Vitals Screenings to include cognitive, depression, and falls Referrals and appointments  In addition, I have reviewed and discussed with patient certain preventive protocols, quality metrics, and best practice recommendations. A written personalized care plan for preventive services as well as general preventive health recommendations were provided to patient.     Marzella Schlein, LPN   09/19/8655   Nurse Notes: none

## 2023-01-02 ENCOUNTER — Ambulatory Visit (HOSPITAL_BASED_OUTPATIENT_CLINIC_OR_DEPARTMENT_OTHER): Payer: PPO | Admitting: Cardiovascular Disease

## 2023-01-23 ENCOUNTER — Encounter: Payer: Self-pay | Admitting: Family Medicine

## 2023-01-23 ENCOUNTER — Ambulatory Visit (INDEPENDENT_AMBULATORY_CARE_PROVIDER_SITE_OTHER): Payer: PPO | Admitting: Family Medicine

## 2023-01-23 VITALS — BP 136/76 | HR 86 | Temp 97.9°F | Resp 18 | Ht 69.0 in | Wt 198.2 lb

## 2023-01-23 DIAGNOSIS — R202 Paresthesia of skin: Secondary | ICD-10-CM | POA: Diagnosis not present

## 2023-01-23 DIAGNOSIS — M3500 Sicca syndrome, unspecified: Secondary | ICD-10-CM

## 2023-01-23 DIAGNOSIS — L299 Pruritus, unspecified: Secondary | ICD-10-CM | POA: Diagnosis not present

## 2023-01-23 DIAGNOSIS — M791 Myalgia, unspecified site: Secondary | ICD-10-CM

## 2023-01-23 DIAGNOSIS — R5383 Other fatigue: Secondary | ICD-10-CM | POA: Diagnosis not present

## 2023-01-23 DIAGNOSIS — E538 Deficiency of other specified B group vitamins: Secondary | ICD-10-CM | POA: Diagnosis not present

## 2023-01-23 DIAGNOSIS — R200 Anesthesia of skin: Secondary | ICD-10-CM

## 2023-01-23 NOTE — Progress Notes (Unsigned)
Subjective:     Patient ID: [name], female    DOB: [birth year], 82 y.o.   MRN: [id number]  Chief Complaint  Patient presents with   Feet Issue    Bottom of feet look blood red, having numbness, noticed weeks ago, comes and goes Severe pain in left lower leg last week, went away after 3 hours    HPI Bottom of feet very red.  Some numbness.  Going on several weeks.  Thinks from Sjogren's Severe pain left lower extremity last week(s) and lasted 3 hrs.-shin area.  At night time-was in bed but not asleep.   Worn out as pain all over and doesn't sleep well.   Intermitt shoulders, other places.   Hands and feet numb.  Itches all over-few years, worse past few years, but past 1 year(s) "aweful".  Worse at night when lying.   Vaginal itching "like crazy".    Health Maintenance Due  Topic Date Due   DTaP/Tdap/Td (2 - Tdap) 04/29/2016   Colonoscopy  03/05/2023    Past Medical History:  Diagnosis Date   Anxiety    Arthritis    right thumb [facility] arthritis   Asthmatic bronchitis 2017   Cancer (HCC)    -NHL R parotid   Cataract    bilateral small- removed both eyes    Colon cancer (HCC)    stage 1- at appendiceal orifice    Complication of anesthesia    has dry mouth anyway from shograns-sore throats post op   Coronary artery calcification seen on CAT scan 11/14/2019   Depression    since 1982    Diverticulosis    Fibromyalgia    GERD (gastroesophageal reflux disease)    past hx    Heart murmur    Hyperlipidemia    controlled - metabolic syndrome per pt- goes up and down    Hypertension    Lung infiltrate on CT 09/14/2015   Meningioma (HCC) 11/2009   right post parietal 16mm - stable 11/2009 MRI   Nephrolithiasis    Nephrolithiasis    Neuromuscular disorder (HCC)    fibromyalgia   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   Pure hypercholesterolemia 11/14/2019   Sjogren's disease (HCC)     Past Surgical History:  Procedure Laterality  Date   ABDOMINAL HYSTERECTOMY     APPENDECTOMY  2008   ARTERY BIOPSY  02/03/2013   Procedure: MINOR BIOPSY TEMPORAL ARTERY;  Surgeon: Darletta Moll, MD;  Location: [facility];  Service: ENT;;   COLONOSCOPY     KIDNEY STONE SURGERY  1974   LEFT HEART CATH AND CORONARY ANGIOGRAPHY N/A 12/01/2019   Procedure: LEFT HEART CATH AND CORONARY ANGIOGRAPHY;  Surgeon: Lennette Bihari, MD;  [facility];  Service: Cardiovascular;  Laterality: N/A;   MASS EXCISION  2008   abd-benign   PAROTIDECTOMY Right 08/01/2015   Procedure: RIGHT PAROTIDECTOMY;  Surgeon: Newman Pies, MD;  Location: [facility];  Service: ENT;  Laterality: Right;   PAROTIDECTOMY Left 04/04/2021   Procedure: LEFT PAROTIDECTOMY;  Surgeon: Newman Pies, MD;  Location: [facility];  Service: ENT;  Laterality: Left;   POLYPECTOMY     TONSILLECTOMY     VAGINAL HYSTERECTOMY  1989     Current Outpatient Medications:    acetaminophen (TYLENOL) 500 MG tablet, Take 500 mg by mouth every 6 (six) hours as needed for moderate pain., Disp: , Rfl:    augmented betamethasone  dipropionate (DIPROLENE-AF) 0.05 % cream, Apply 1 a small amount to affected area twice a day, Disp: , Rfl:    Calcium Carbonate (CALCIUM 500 PO), Take by mouth., Disp: , Rfl:    clotrimazole-betamethasone (LOTRISONE) cream, Apply topically 2 (two) times daily., Disp: , Rfl:    hydrALAZINE (APRESOLINE) 50 MG tablet, Take 1 tablet (50 mg total) by mouth in the morning and at bedtime. 12 HOURS APART, Disp: 180 tablet, Rfl: 3   metoprolol tartrate (LOPRESSOR) 25 MG tablet, TAKE 1 TABLET(25 MG) BY MOUTH TWICE DAILY AS NEEDED FOR PALPITATIONS (Patient taking differently: as needed.), Disp: 180 tablet, Rfl: 3   PREVIDENT 5000 ENAMEL PROTECT 1.1-5 % GEL, Take by mouth at bedtime., Disp: , Rfl:    simvastatin (ZOCOR) 20 MG tablet, Take 1 tablet (20 mg total) by mouth at bedtime., Disp: 90 tablet, Rfl: 3   valsartan (DIOVAN) 160 MG tablet,  Take 1 tablet (160 mg total) by mouth daily., Disp: 90 tablet, Rfl: 3   Cholecalciferol (VITAMIN D3) 50 MCG (2000 UT) TABS, Take 2 tablets by mouth daily. (Patient not taking: Reported on 01/23/2023), Disp: , Rfl:   Allergies  Allergen Reactions   Lisinopril Cough   ROS neg/noncontributory except as noted HPI/below      Objective:     BP 136/76   Pulse 86   Temp 97.9 F (36.6 C) (Temporal)   Resp 18   Ht 5\' 9"  (1.753 m)   Wt 198 lb 4 oz (89.9 kg)   SpO2 96%   BMI 29.28 kg/m  Wt Readings from Last 3 Encounters:  01/23/23 198 lb 4 oz (89.9 kg)  12/18/22 198 lb (89.8 kg)  12/05/22 198 lb 6 oz (90 kg)    Physical Exam   Gen: WDWN NAD HEENT: NCAT, conjunctiva not injected, sclera nonicteric NECK:  supple, no thyromegaly, no nodes, no carotid bruits CARDIAC: RRR, S1S2+, 1/6 murmur. DP 2+B LUNGS: CTAB. No wheezes EXT:  no edema MSK: no gross abnormalities. MS 5/5 all 4 NEURO: A&O x3.  CN II-XII intact.  PSYCH: normal mood. Good eye contact Palms and soles red.  Good capillary refill     Assessment & Plan:  Myalgia -     Sedimentation rate -     C-reactive protein -     CK  Vitamin B12 deficiency -     Vitamin B12 -     CBC with Differential/Platelet  Pruritus -     Sedimentation rate -     Comprehensive metabolic panel -     C-reactive protein -     CK  SJOGREN'S SYNDROME  Fatigue, unspecified type  Numbness and tingling  1.  Myalgias-she does have fibromyalgia.  Also on statin.  Will stop simvastatin for 1 week, check sed rate (to rule out PMR) CRP, CK (rhabdo) patient is getting "worn out from having pain all the time. 2.  Numbness/tingling-may be neuropathy, fibromyalgia, nerve impingement, vitamin B12 deficiency (does have a history of) will check labs 3.  Pruritus-chronic, but worsening.  May be from medications, anxiety, other.  Will check labs to make sure not elevated alk phos.  Consider gabapentin. 4.  Fatigue-patient also has fibromyalgia.  Check  labs 5.  Sjogren's syndrome-patient brought a booklet that she had at home.  Many of her symptoms can be caused from Sjogren's.  Also, history of lymphoma.  Will rule out other causes first. 6.  Some labile blood pressures.  Feeling her heart pounding.  Only takes metoprolol as  needed and that is rarely.  She has a lot of complaints and distressed about them.  Will trial metoprolol half tab twice a day.  This may help the unsettling feeling.  Return in about 1 week (around 01/30/2023), or if symptoms worsen or fail to improve, for itch.  Angelena Sole, MD

## 2023-01-23 NOTE — Patient Instructions (Signed)
Metoprolol 1/2 tab twice/day and stop simvastatin for 1 week(s) and see how doing.

## 2023-01-24 DIAGNOSIS — L299 Pruritus, unspecified: Secondary | ICD-10-CM | POA: Insufficient documentation

## 2023-01-24 DIAGNOSIS — E538 Deficiency of other specified B group vitamins: Secondary | ICD-10-CM | POA: Insufficient documentation

## 2023-01-24 LAB — COMPREHENSIVE METABOLIC PANEL
ALT: 10 U/L (ref 0–35)
AST: 15 U/L (ref 0–37)
Albumin: 4.3 g/dL (ref 3.5–5.2)
Alkaline Phosphatase: 89 U/L (ref 39–117)
BUN: 14 mg/dL (ref 6–23)
CO2: 26 mEq/L (ref 19–32)
Calcium: 9.8 mg/dL (ref 8.4–10.5)
Chloride: 102 mEq/L (ref 96–112)
Creatinine, Ser: 0.65 mg/dL (ref 0.40–1.20)
GFR: 82.21 mL/min (ref >60.00)
Glucose, Bld: 90 mg/dL (ref 70–99)
Potassium: 4.1 mEq/L (ref 3.5–5.1)
Sodium: 139 mEq/L (ref 135–145)
Total Bilirubin: 0.4 mg/dL (ref 0.2–1.2)
Total Protein: 7.3 g/dL (ref 6.0–8.3)

## 2023-01-24 LAB — CBC WITH DIFFERENTIAL/PLATELET
Basophils Absolute: 0.1 10*3/uL (ref 0.0–0.1)
Basophils Relative: 0.9 % (ref 0.0–3.0)
Eosinophils Absolute: 0.2 10*3/uL (ref 0.0–0.7)
Eosinophils Relative: 2.6 % (ref 0.0–5.0)
HCT: 36.2 % (ref 36.0–46.0)
Hemoglobin: 12.1 g/dL (ref 12.0–15.0)
Lymphocytes Relative: 16.5 % (ref 12.0–46.0)
Lymphs Abs: 1.1 10*3/uL (ref 0.7–4.0)
MCHC: 33.5 g/dL (ref 30.0–36.0)
MCV: 88.4 fl (ref 78.0–100.0)
Monocytes Absolute: 0.6 10*3/uL (ref 0.1–1.0)
Monocytes Relative: 9.4 % (ref 3.0–12.0)
Neutro Abs: 4.5 10*3/uL (ref 1.4–7.7)
Neutrophils Relative %: 70.6 % (ref 43.0–77.0)
Platelets: 236 10*3/uL (ref 150.0–400.0)
RBC: 4.1 Mil/uL (ref 3.87–5.11)
RDW: 13.4 % (ref 11.5–15.5)
WBC: 6.4 10*3/uL (ref 4.0–10.5)

## 2023-01-24 LAB — SEDIMENTATION RATE: Sed Rate: 46 mm/hr — ABNORMAL HIGH (ref 0–30)

## 2023-01-24 LAB — CK: Total CK: 54 U/L (ref 7–177)

## 2023-01-24 LAB — VITAMIN B12: Vitamin B-12: 186 pg/mL — ABNORMAL LOW (ref 211–911)

## 2023-01-24 LAB — C-REACTIVE PROTEIN: CRP: <1 mg/dL (ref 0.5–20.0)

## 2023-01-24 NOTE — Progress Notes (Signed)
Vitamin B12 low-since having so many symptoms, should do shots weekly x 4 and then monthly if willing.  Also, sed rate is elevated-inflammation.  Does she want to try course of steroids to see if helps, or see rheumatologist?

## 2023-01-30 ENCOUNTER — Ambulatory Visit (INDEPENDENT_AMBULATORY_CARE_PROVIDER_SITE_OTHER): Payer: PPO | Admitting: Family Medicine

## 2023-01-30 ENCOUNTER — Encounter: Payer: Self-pay | Admitting: Family Medicine

## 2023-01-30 VITALS — BP 130/60 | HR 74 | Temp 97.8°F | Resp 18 | Ht 69.0 in | Wt 198.2 lb

## 2023-01-30 DIAGNOSIS — R700 Elevated erythrocyte sedimentation rate: Secondary | ICD-10-CM

## 2023-01-30 DIAGNOSIS — L299 Pruritus, unspecified: Secondary | ICD-10-CM

## 2023-01-30 DIAGNOSIS — E538 Deficiency of other specified B group vitamins: Secondary | ICD-10-CM | POA: Diagnosis not present

## 2023-01-30 DIAGNOSIS — G8929 Other chronic pain: Secondary | ICD-10-CM | POA: Diagnosis not present

## 2023-01-30 DIAGNOSIS — I10 Essential (primary) hypertension: Secondary | ICD-10-CM

## 2023-01-30 DIAGNOSIS — M791 Myalgia, unspecified site: Secondary | ICD-10-CM

## 2023-01-30 DIAGNOSIS — M546 Pain in thoracic spine: Secondary | ICD-10-CM | POA: Diagnosis not present

## 2023-01-30 NOTE — Assessment & Plan Note (Signed)
Not sure if due to lack of intake or malabsorption.  Patient declines injections at this time.  She will stop and get vitamin B12 2000 mcg daily.  Advised to follow-up in 1 month, however she prefers to wait 3.

## 2023-01-30 NOTE — Assessment & Plan Note (Signed)
Chronic.  Much improved off of the simvastatin.  Continue to remain off.  Monitor.  She would prefer not taking any statin medications at this time until she sees cardiology next month

## 2023-01-30 NOTE — Assessment & Plan Note (Signed)
Chronic.  Advised to follow-up with Ortho

## 2023-01-30 NOTE — Assessment & Plan Note (Signed)
Chronic.  No difference with stopping statin.  Suspect some fibromyalgia.  Does not want any further meds

## 2023-01-30 NOTE — Patient Instructions (Addendum)
Get B12 2000 mcg/day  Stay off simvastatin  See Dr. Frazier Butt  Take tumeric capsules.

## 2023-01-30 NOTE — Progress Notes (Signed)
Subjective:     Patient ID: [name], female    DOB: April 08, 1941, 82 y.o.   MRN: [id number]  Chief Complaint  Patient presents with   Follow-up    1 week follow-up Discuss recent results and treatment options    HPI Myalgias-off statin for 1 week(s). No change in pain.  Spot low back bad. Since biopsy done in past. B12 deficiency -not on supps Labile blood pressure-taking metoprolol 1/2 tab twice daily-occasional getting "booming" in ear - irritating when quiet. Itch better since off simvastatin Some sciatica on left-long time.  Getting worse.   Esr 46-has shoulder pains, other pains B12 186-no medications yet.  In past yes.   Health Maintenance Due  Topic Date Due   DTaP/Tdap/Td (2 - Tdap) 04/29/2016   Colonoscopy  03/05/2023    Past Medical History:  Diagnosis Date   Anxiety    Arthritis    right thumb CMC arthritis   Asthmatic bronchitis 2017   Cancer (HCC)    -NHL R parotid   Cataract    bilateral small- removed both eyes    Colon cancer (HCC)    stage 1- at appendiceal orifice    Complication of anesthesia    has dry mouth anyway from shograns-sore throats post op   Coronary artery calcification seen on CAT scan 11/14/2019   Depression    since 1982    Diverticulosis    Fibromyalgia    GERD (gastroesophageal reflux disease)    past hx    Heart murmur    Hyperlipidemia    controlled - metabolic syndrome per pt- goes up and down    Hypertension    Lung infiltrate on CT 09/14/2015   Meningioma (HCC) 11/2009   right post parietal 16mm - stable 11/2009 MRI   Nephrolithiasis    Nephrolithiasis    Neuromuscular disorder (HCC)    fibromyalgia   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   Pure hypercholesterolemia 11/14/2019   Sjogren's disease (HCC)     Past Surgical History:  Procedure Laterality Date   ABDOMINAL HYSTERECTOMY     APPENDECTOMY  2008   ARTERY BIOPSY  02/03/2013   Procedure: MINOR BIOPSY TEMPORAL ARTERY;   Surgeon: Darletta Moll, MD;  Location: [facility];  Service: ENT;;   COLONOSCOPY     KIDNEY STONE SURGERY  1974   LEFT HEART CATH AND CORONARY ANGIOGRAPHY N/A 12/01/2019   Procedure: LEFT HEART CATH AND CORONARY ANGIOGRAPHY;  Surgeon: Lennette Bihari, MD;  Location: [facility];  Service: Cardiovascular;  Laterality: N/A;   MASS EXCISION  2008   abd-benign   PAROTIDECTOMY Right 08/01/2015   Procedure: RIGHT PAROTIDECTOMY;  Surgeon: Newman Pies, MD;  Location: [facility];  Service: ENT;  Laterality: Right;   PAROTIDECTOMY Left 04/04/2021   Procedure: LEFT PAROTIDECTOMY;  Surgeon: Newman Pies, MD;  Location: [facility];  Service: ENT;  Laterality: Left;   POLYPECTOMY     TONSILLECTOMY     VAGINAL HYSTERECTOMY  1989     Current Outpatient Medications:    acetaminophen (TYLENOL) 500 MG tablet, Take 500 mg by mouth every 6 (six) hours as needed for moderate pain., Disp: , Rfl:    augmented betamethasone dipropionate (DIPROLENE-AF) 0.05 % cream, Apply 1 a small amount to affected area twice a day, Disp: , Rfl:    Calcium Carbonate (CALCIUM 500 PO), Take by mouth., Disp: , Rfl:    Cholecalciferol (VITAMIN D3)  50 MCG (2000 UT) TABS, Take 2 tablets by mouth daily., Disp: , Rfl:    clotrimazole-betamethasone (LOTRISONE) cream, Apply topically 2 (two) times daily., Disp: , Rfl:    hydrALAZINE (APRESOLINE) 50 MG tablet, Take 1 tablet (50 mg total) by mouth in the morning and at bedtime. 12 HOURS APART, Disp: 180 tablet, Rfl: 3   metoprolol tartrate (LOPRESSOR) 25 MG tablet, TAKE 1 TABLET(25 MG) BY MOUTH TWICE DAILY AS NEEDED FOR PALPITATIONS (Patient taking differently: as needed.), Disp: 180 tablet, Rfl: 3   PREVIDENT 5000 ENAMEL PROTECT 1.1-5 % GEL, Take by mouth at bedtime., Disp: , Rfl:    valsartan (DIOVAN) 160 MG tablet, Take 1 tablet (160 mg total) by mouth daily., Disp: 90 tablet, Rfl: 3  Allergies  Allergen Reactions   Lisinopril Cough   Simvastatin  Itching   ROS neg/noncontributory except as noted HPI/below      Objective:     BP 130/60   Pulse 74   Temp 97.8 F (36.6 C) (Temporal)   Resp 18   Ht 5\' 9"  (1.753 m)   Wt 198 lb 4 oz (89.9 kg)   SpO2 96%   BMI 29.28 kg/m  Wt Readings from Last 3 Encounters:  01/30/23 198 lb 4 oz (89.9 kg)  01/23/23 198 lb 4 oz (89.9 kg)  12/18/22 198 lb (89.8 kg)    Physical Exam   Gen: WDWN NAD HEENT: NCAT, conjunctiva not injected, sclera nonicteric NECK:  supple, no thyromegaly, no nodes, no carotid bruits CARDIAC: RRR, S1S2+,2/6 sm murmur. DP 2+B LUNGS: CTAB. No wheezes ABDOMEN:  BS+, soft, NTND, No HSM, no masses EXT:  no edema MSK: no gross abnormalities. No tenderness to palpation spine.  NEURO: A&O x3.  CN II-XII intact.  PSYCH: normal mood. Good eye contact  Reviewed labs     Assessment & Plan:  Essential hypertension Assessment & Plan: Chronic.  Controlled.  Continue hydralazine 50 mg 3 times daily.  Continue Lopressor 12.5 mg twice daily.  Continue valsartan 160 mg daily.   Pruritus Assessment & Plan: Chronic.  Much improved off of the simvastatin.  Continue to remain off.  Monitor.  She would prefer not taking any statin medications at this time until she sees cardiology next month   Chronic left-sided thoracic back pain Assessment & Plan: Chronic.  Advised to follow-up with Ortho   Vitamin B12 deficiency Assessment & Plan: Not sure if due to lack of intake or malabsorption.  Patient declines injections at this time.  She will stop and get vitamin B12 2000 mcg daily.  Advised to follow-up in 1 month, however she prefers to wait 3.   Myalgia Assessment & Plan: Chronic.  No difference with stopping statin.  Suspect some fibromyalgia.  Does not want any further meds   Elevated sed rate Assessment & Plan: Chronic.  Patient does not want to take prednisone.  Does not want to see rheumatology.  Aware that there is some inflammation going on in her body.  She  has multiple arthritic complaints.     Return in about 3 months (around 05/02/2023) for bp, pain.  Angelena Sole, MD

## 2023-01-30 NOTE — Assessment & Plan Note (Signed)
Chronic.  Controlled.  Continue hydralazine 50 mg 3 times daily.  Continue Lopressor 12.5 mg twice daily.  Continue valsartan 160 mg daily.

## 2023-01-30 NOTE — Assessment & Plan Note (Signed)
Chronic.  Patient does not want to take prednisone.  Does not want to see rheumatology.  Aware that there is some inflammation going on in her body.  She has multiple arthritic complaints.

## 2023-02-11 ENCOUNTER — Emergency Department (HOSPITAL_BASED_OUTPATIENT_CLINIC_OR_DEPARTMENT_OTHER): Payer: PPO

## 2023-02-11 ENCOUNTER — Observation Stay (HOSPITAL_BASED_OUTPATIENT_CLINIC_OR_DEPARTMENT_OTHER)
Admission: EM | Admit: 2023-02-11 | Discharge: 2023-02-12 | Disposition: A | Discharge status: Home / Self Care | Payer: PPO | Attending: Internal Medicine | Admitting: Internal Medicine

## 2023-02-11 ENCOUNTER — Encounter (HOSPITAL_BASED_OUTPATIENT_CLINIC_OR_DEPARTMENT_OTHER): Payer: Self-pay | Admitting: *Deleted

## 2023-02-11 ENCOUNTER — Other Ambulatory Visit: Payer: Self-pay

## 2023-02-11 ENCOUNTER — Emergency Department (HOSPITAL_BASED_OUTPATIENT_CLINIC_OR_DEPARTMENT_OTHER): Payer: PPO | Admitting: Radiology

## 2023-02-11 DIAGNOSIS — E876 Hypokalemia: Secondary | ICD-10-CM | POA: Insufficient documentation

## 2023-02-11 DIAGNOSIS — Z85038 Personal history of other malignant neoplasm of large intestine: Secondary | ICD-10-CM | POA: Insufficient documentation

## 2023-02-11 DIAGNOSIS — R509 Fever, unspecified: Secondary | ICD-10-CM | POA: Diagnosis not present

## 2023-02-11 DIAGNOSIS — Z1152 Encounter for screening for COVID-19: Secondary | ICD-10-CM | POA: Insufficient documentation

## 2023-02-11 DIAGNOSIS — Z85858 Personal history of malignant neoplasm of other endocrine glands: Secondary | ICD-10-CM | POA: Diagnosis not present

## 2023-02-11 DIAGNOSIS — I10 Essential (primary) hypertension: Secondary | ICD-10-CM | POA: Diagnosis not present

## 2023-02-11 DIAGNOSIS — J9601 Acute respiratory failure with hypoxia: Secondary | ICD-10-CM | POA: Diagnosis not present

## 2023-02-11 DIAGNOSIS — R0602 Shortness of breath: Secondary | ICD-10-CM | POA: Diagnosis not present

## 2023-02-11 DIAGNOSIS — R059 Cough, unspecified: Secondary | ICD-10-CM | POA: Diagnosis not present

## 2023-02-11 DIAGNOSIS — R6520 Severe sepsis without septic shock: Secondary | ICD-10-CM | POA: Diagnosis not present

## 2023-02-11 DIAGNOSIS — J189 Pneumonia, unspecified organism: Principal | ICD-10-CM | POA: Diagnosis present

## 2023-02-11 DIAGNOSIS — J181 Lobar pneumonia, unspecified organism: Secondary | ICD-10-CM | POA: Diagnosis not present

## 2023-02-11 DIAGNOSIS — J90 Pleural effusion, not elsewhere classified: Secondary | ICD-10-CM | POA: Diagnosis not present

## 2023-02-11 DIAGNOSIS — Z79899 Other long term (current) drug therapy: Secondary | ICD-10-CM | POA: Insufficient documentation

## 2023-02-11 DIAGNOSIS — A419 Sepsis, unspecified organism: Secondary | ICD-10-CM | POA: Diagnosis not present

## 2023-02-11 DIAGNOSIS — C884 Extranodal marginal zone B-cell lymphoma of mucosa-associated lymphoid tissue [MALT-lymphoma]: Secondary | ICD-10-CM | POA: Insufficient documentation

## 2023-02-11 LAB — CBC WITH DIFFERENTIAL/PLATELET
Abs Immature Granulocytes: 0.06 10*3/uL (ref 0.00–0.07)
Basophils Absolute: 0 10*3/uL (ref 0.0–0.1)
Basophils Relative: 0 %
Eosinophils Absolute: 0.1 10*3/uL (ref 0.0–0.5)
Eosinophils Relative: 0 %
HCT: 35.4 % — ABNORMAL LOW (ref 36.0–46.0)
Hemoglobin: 11.8 g/dL — ABNORMAL LOW (ref 12.0–15.0)
Immature Granulocytes: 0 %
Lymphocytes Relative: 4 %
Lymphs Abs: 0.5 10*3/uL — ABNORMAL LOW (ref 0.7–4.0)
MCH: 29.5 pg (ref 26.0–34.0)
MCHC: 33.3 g/dL (ref 30.0–36.0)
MCV: 88.5 fL (ref 80.0–100.0)
Monocytes Absolute: 0.6 10*3/uL (ref 0.1–1.0)
Monocytes Relative: 4 %
Neutro Abs: 12.8 10*3/uL — ABNORMAL HIGH (ref 1.7–7.7)
Neutrophils Relative %: 92 %
Platelets: 195 10*3/uL (ref 150–400)
RBC: 4 MIL/uL (ref 3.87–5.11)
RDW: 12.8 % (ref 11.5–15.5)
WBC: 14 10*3/uL — ABNORMAL HIGH (ref 4.0–10.5)
nRBC: 0 % (ref 0.0–0.2)

## 2023-02-11 LAB — PROTIME-INR
INR: 1 (ref 0.8–1.2)
Prothrombin Time: 13.2 seconds (ref 11.4–15.2)

## 2023-02-11 LAB — COMPREHENSIVE METABOLIC PANEL
ALT: 14 U/L (ref 0–44)
AST: 18 U/L (ref 15–41)
Albumin: 4.2 g/dL (ref 3.5–5.0)
Alkaline Phosphatase: 71 U/L (ref 38–126)
Anion gap: 9 (ref 5–15)
BUN: 13 mg/dL (ref 8–23)
CO2: 26 mmol/L (ref 22–32)
Calcium: 9.3 mg/dL (ref 8.9–10.3)
Chloride: 100 mmol/L (ref 98–111)
Creatinine, Ser: 0.66 mg/dL (ref 0.44–1.00)
GFR, Estimated: >60 mL/min (ref >60)
Glucose, Bld: 123 mg/dL — ABNORMAL HIGH (ref 70–99)
Potassium: 4.1 mmol/L (ref 3.5–5.1)
Sodium: 135 mmol/L (ref 135–145)
Total Bilirubin: 0.4 mg/dL (ref 0.3–1.2)
Total Protein: 7.6 g/dL (ref 6.5–8.1)

## 2023-02-11 LAB — LACTIC ACID, PLASMA
Lactic Acid, Venous: 2.3 mmol/L (ref 0.5–1.9)
Lactic Acid, Venous: 1.7 mmol/L (ref 0.5–1.9)

## 2023-02-11 LAB — APTT: aPTT: 26 seconds (ref 24–36)

## 2023-02-11 LAB — URINALYSIS, W/ REFLEX TO CULTURE (INFECTION SUSPECTED)
Bacteria, UA: NONE SEEN
Bilirubin Urine: NEGATIVE
Glucose, UA: NEGATIVE mg/dL
Hgb urine dipstick: NEGATIVE
Ketones, ur: NEGATIVE mg/dL
Leukocytes,Ua: NEGATIVE
Nitrite: NEGATIVE
Protein, ur: NEGATIVE mg/dL
Specific Gravity, Urine: 1.012 (ref 1.005–1.030)
pH: 7.5 (ref 5.0–8.0)

## 2023-02-11 LAB — RESP PANEL BY RT-PCR (RSV, FLU A&B, COVID)  RVPGX2
Influenza A by PCR: NEGATIVE
Influenza B by PCR: NEGATIVE
Resp Syncytial Virus by PCR: NEGATIVE
SARS Coronavirus 2 by RT PCR: NEGATIVE

## 2023-02-11 MED ORDER — ACETAMINOPHEN 325 MG PO TABS
650.0000 mg | ORAL_TABLET | Freq: Once | ORAL | Status: AC
  Administered 2023-02-11: 650 mg via ORAL
  Filled 2023-02-11: qty 2

## 2023-02-11 MED ORDER — SODIUM CHLORIDE 0.9 % IV SOLN
1.0000 g | Freq: Once | INTRAVENOUS | Status: AC
  Administered 2023-02-11: 1 g via INTRAVENOUS
  Filled 2023-02-11: qty 10

## 2023-02-11 MED ORDER — HYDRALAZINE HCL 50 MG PO TABS
50.0000 mg | ORAL_TABLET | Freq: Two times a day (BID) | ORAL | Status: DC
  Administered 2023-02-11 – 2023-02-12 (×2): 50 mg via ORAL
  Filled 2023-02-11 (×3): qty 1

## 2023-02-11 MED ORDER — SODIUM CHLORIDE 0.9 % IV SOLN
500.0000 mg | Freq: Once | INTRAVENOUS | Status: AC
  Administered 2023-02-11: 500 mg via INTRAVENOUS
  Filled 2023-02-11: qty 5

## 2023-02-11 MED ORDER — ENOXAPARIN SODIUM 40 MG/0.4ML IJ SOSY
40.0000 mg | PREFILLED_SYRINGE | Freq: Every day | INTRAMUSCULAR | Status: DC
  Administered 2023-02-11 – 2023-02-12 (×2): 40 mg via SUBCUTANEOUS
  Filled 2023-02-11 (×2): qty 0.4

## 2023-02-11 MED ORDER — METOPROLOL TARTRATE 5 MG/5ML IV SOLN: 5.0000 mg | Freq: Four times a day (QID) | INTRAVENOUS | Status: DC | PRN

## 2023-02-11 MED ORDER — IRBESARTAN 150 MG PO TABS
150.0000 mg | ORAL_TABLET | Freq: Every day | ORAL | Status: DC
  Filled 2023-02-11 (×2): qty 1

## 2023-02-11 MED ORDER — ONDANSETRON HCL 4 MG/2ML IJ SOLN
4.0000 mg | Freq: Once | INTRAMUSCULAR | Status: AC
  Administered 2023-02-11: 4 mg via INTRAVENOUS
  Filled 2023-02-11: qty 2

## 2023-02-11 MED ORDER — TRAZODONE HCL 50 MG PO TABS: 25.0000 mg | ORAL_TABLET | Freq: Every evening | ORAL | Status: DC | PRN

## 2023-02-11 MED ORDER — NAPHAZOLINE-PHENIRAMINE 0.025-0.3 % OP SOLN: 2.0000 [drp] | Freq: Four times a day (QID) | OPHTHALMIC | Status: DC | PRN

## 2023-02-11 MED ORDER — ACETAMINOPHEN 325 MG PO TABS: 650.0000 mg | ORAL_TABLET | Freq: Four times a day (QID) | ORAL | Status: DC | PRN

## 2023-02-11 MED ORDER — SODIUM CHLORIDE 0.9 % IV SOLN
1.0000 g | INTRAVENOUS | Status: DC
  Administered 2023-02-11: 1 g via INTRAVENOUS
  Filled 2023-02-11: qty 10

## 2023-02-11 MED ORDER — ALBUTEROL SULFATE (2.5 MG/3ML) 0.083% IN NEBU: 2.5000 mg | INHALATION_SOLUTION | RESPIRATORY_TRACT | Status: DC | PRN

## 2023-02-11 MED ORDER — ONDANSETRON HCL 4 MG/2ML IJ SOLN: 4.0000 mg | Freq: Four times a day (QID) | INTRAMUSCULAR | Status: DC | PRN

## 2023-02-11 MED ORDER — ONDANSETRON HCL 4 MG PO TABS: 4.0000 mg | ORAL_TABLET | Freq: Four times a day (QID) | ORAL | Status: DC | PRN

## 2023-02-11 MED ORDER — BENZONATATE 100 MG PO CAPS
100.0000 mg | ORAL_CAPSULE | Freq: Once | ORAL | Status: AC
  Administered 2023-02-11: 100 mg via ORAL
  Filled 2023-02-11: qty 1

## 2023-02-11 MED ORDER — SODIUM CHLORIDE 0.9 % IV SOLN
500.0000 mg | INTRAVENOUS | Status: DC
  Administered 2023-02-11: 500 mg via INTRAVENOUS
  Filled 2023-02-11: qty 5

## 2023-02-11 MED ORDER — ACETAMINOPHEN 650 MG RE SUPP: 650.0000 mg | Freq: Four times a day (QID) | RECTAL | Status: DC | PRN

## 2023-02-11 MED ORDER — LACTATED RINGERS IV BOLUS (SEPSIS)
1000.0000 mL | Freq: Once | INTRAVENOUS | Status: AC
  Administered 2023-02-11: 1000 mL via INTRAVENOUS

## 2023-02-11 MED ORDER — IOHEXOL 300 MG/ML  SOLN
100.0000 mL | Freq: Once | INTRAMUSCULAR | Status: AC | PRN
  Administered 2023-02-11: 75 mL via INTRAVENOUS

## 2023-02-11 NOTE — ED Notes (Signed)
Patient to xray.

## 2023-02-11 NOTE — ED Notes (Signed)
Dr. Bernette Mayers aware of 2.3 lactic.

## 2023-02-11 NOTE — Progress Notes (Signed)
Pt ambulated 100 ft, room air, O2 dropped to 88 but after a couple of min O2 picked up to 93% w/HR at 106.   Pt resting in chair comfortably room air.

## 2023-02-11 NOTE — H&P (Signed)
History and Physical  [name] ZOX:[id number] DOB: May 03, 1941 DOA: 02/11/2023  PCP: Jeani Sow, MD   Chief Complaint: Congestion, cough  HPI: [name] is a 82 y.o. 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  Patient states she was in her usual state of health until about 7 days ago, she started having congestion, this later developed in the cough and about 3 days ago she started feeling short of breath.  She was also having some subjective fevers and chills.  She lives alone, so decided to call EMS late last night.  Denies any abdominal pain, vomiting, nausea, diarrhea.  ED Course: Upon evaluation in the emergency department, she was noted to be saturating 88% on room air, was placed on 2 to 3 L nasal cannula oxygen.  She had a fever of 100.6.  Labs were done, significant for new leukocytosis 14,000, lactate 2.3 which improved to 1.7 after IV fluids and empiric IV azithromycin and Rocephin.  The patient has remained hemodynamically stable.  Chest x-ray as noted below shows evidence of community-acquired pneumonia.  Review of Systems: Please see HPI for pertinent positives and negatives. A complete 10 system review of systems are otherwise negative.  Past Medical History:  Diagnosis Date   Anxiety    Arthritis    right thumb CMC arthritis   Asthmatic bronchitis 2017   Cancer (HCC)    -NHL R parotid   Cataract    bilateral small- removed both eyes    Colon cancer (HCC)    stage 1- at appendiceal orifice    Complication of anesthesia    has dry mouth anyway from shograns-sore throats post op   Coronary artery calcification seen on CAT scan 11/14/2019   Depression    since 1982    Diverticulosis    Fibromyalgia    GERD (gastroesophageal reflux disease)    past hx    Heart murmur    Hyperlipidemia    controlled - metabolic syndrome per pt-  goes up and down    Hypertension    Lung infiltrate on CT 09/14/2015   Meningioma (HCC) 11/2009   right post parietal 16mm - stable 11/2009 MRI   Nephrolithiasis    Nephrolithiasis    Neuromuscular disorder (HCC)    fibromyalgia   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   Pure hypercholesterolemia 11/14/2019   Sjogren's disease (HCC)    Past Surgical History:  Procedure Laterality Date   ABDOMINAL HYSTERECTOMY     APPENDECTOMY  2008   ARTERY BIOPSY  02/03/2013   Procedure: MINOR BIOPSY TEMPORAL ARTERY;  Surgeon: Darletta Moll, MD;  Location: [facility];  Service: ENT;;   COLONOSCOPY     KIDNEY STONE SURGERY  1974   LEFT HEART CATH AND CORONARY ANGIOGRAPHY N/A 12/01/2019   Procedure: LEFT HEART CATH AND CORONARY ANGIOGRAPHY;  Surgeon: Lennette Bihari, MD;  [facility]: [facility];  Service: Cardiovascular;  Laterality: N/A;   MASS EXCISION  2008   abd-benign   PAROTIDECTOMY Right 08/01/2015   Procedure: RIGHT PAROTIDECTOMY;  Surgeon: Newman Pies, MD;  Location: [facility];  Service: ENT;  Laterality: Right;   PAROTIDECTOMY Left 04/04/2021   Procedure: LEFT PAROTIDECTOMY;  Surgeon: Newman Pies, MD;  Location: [facility];  Service: ENT;  Laterality: Left;   POLYPECTOMY     TONSILLECTOMY  VAGINAL HYSTERECTOMY  1989    Social History:  reports that she has never smoked. She has never used smokeless tobacco. She reports current alcohol use. She reports that she does not use drugs.   Allergies  Allergen Reactions   Lisinopril    Simvastatin Itching    Family History  Problem Relation Age of Onset   Colon cancer Mother 36   Colon cancer Father        unknown when onset was   Suicidality Son    Diabetes Neg Hx    Stomach cancer Neg Hx    Colon polyps Neg Hx    Esophageal cancer Neg Hx    Rectal cancer Neg Hx      Prior to Admission medications   Medication Sig Start Date End Date Taking? Authorizing Provider   acetaminophen (TYLENOL) 500 MG tablet Take 500 mg by mouth every 6 (six) hours as needed for moderate pain.    [provider]  augmented betamethasone dipropionate (DIPROLENE-AF) 0.05 % cream Apply 1 a small amount to affected area twice a day 06/29/21   [provider]  Calcium Carbonate (CALCIUM 500 PO) Take by mouth.    [provider]  Cholecalciferol (VITAMIN D3) 50 MCG (2000 UT) TABS Take 2 tablets by mouth daily.    [provider]  clotrimazole-betamethasone (LOTRISONE) cream Apply topically 2 (two) times daily. 05/09/21   [provider]  hydrALAZINE (APRESOLINE) 50 MG tablet Take 1 tablet (50 mg total) by mouth in the morning and at bedtime. 12 HOURS APART 07/09/22   Alver Sorrow, NP  metoprolol tartrate (LOPRESSOR) 25 MG tablet TAKE 1 TABLET(25 MG) BY MOUTH TWICE DAILY AS NEEDED FOR PALPITATIONS Patient taking differently: as needed. 05/24/22   Sharlene Dory, NP  PREVIDENT 5000 ENAMEL PROTECT 1.1-5 % GEL Take by mouth at bedtime. 01/15/23   [provider]  valsartan (DIOVAN) 160 MG tablet Take 1 tablet (160 mg total) by mouth daily. 05/22/22   Sharlene Dory, NP    Physical Exam: BP (!) 142/67   Pulse 95   Temp 98.4 F (36.9 C) (Oral)   Resp (!) 22   Ht 5\' 9"  (1.753 m)   Wt 89.4 kg   SpO2 95%   BMI 29.09 kg/m   General:  Alert, oriented, calm, in no acute distress, wearing 2 L nasal cannula oxygen and speaking in full sentences, looks comfortable and nontoxic Eyes: EOMI, clear conjuctivae, white sclerea Neck: supple, no masses, trachea mildline  Cardiovascular: RRR, no murmurs or rubs, no peripheral edema  Respiratory: Equal bilateral chest rise, no tachypnea, no wheezing heard abdomen: soft, nontender, nondistended, normal bowel tones heard  Skin: dry, no rashes  Musculoskeletal: no joint effusions, normal range of motion  Psychiatric: appropriate affect, normal speech  Neurologic: extraocular muscles intact,  clear speech, moving all extremities with intact sensorium          Labs on Admission:  Basic Metabolic Panel: Recent Labs  Lab 02/11/23 0243  NA 135  K 4.1  CL 100  CO2 26  GLUCOSE 123*  BUN 13  CREATININE 0.66  CALCIUM 9.3   Liver Function Tests: Recent Labs  Lab 02/11/23 0243  AST 18  ALT 14  ALKPHOS 71  BILITOT 0.4  PROT 7.6  ALBUMIN 4.2   No results for input(s): "LIPASE", "AMYLASE" in the last 168 hours. No results for input(s): "AMMONIA" in the last 168 hours. CBC: Recent Labs  Lab 02/11/23 0243  WBC 14.0*  NEUTROABS 12.8*  HGB 11.8*  HCT 35.4*  MCV 88.5  PLT 195   Cardiac Enzymes: No results for input(s): "CKTOTAL", "CKMB", "CKMBINDEX", "TROPONINI" in the last 168 hours.  BNP (last 3 results) No results for input(s): "BNP" in the last 8760 hours.  ProBNP (last 3 results) No results for input(s): "PROBNP" in the last 8760 hours.  CBG: No results for input(s): "GLUCAP" in the last 168 hours.  Radiological Exams on Admission: DG Chest 2 View  Result Date: 02/11/2023 CLINICAL DATA:  Shortness of breath and cough EXAM: CHEST - 2 VIEW COMPARISON:  Chest radiograph 11/08/2020 and CT chest 03/05/2022 FINDINGS: Stable cardiomediastinal silhouette. Aortic atherosclerotic calcification. Chronic bronchitic change. There is new thickening about a few of the cysts in the left lower lung on AP view without correlate on lateral view. Superimposed infection/pneumonia is difficult to exclude. No pleural effusion or pneumothorax. No displaced rib fractures. IMPRESSION: Thickening about the cysts in the left lower lung may be due to pneumonia/superimposed infection. Consider chest CT for further evaluation. Electronically Signed   By: Minerva Fester M.D.   On: 02/11/2023 03:57    Assessment/Plan Sepsis-meeting criteria with leukocytosis, tachycardia, fever, source is suspected community-acquired pneumonia, and lactate 2.3. -Inpatient admission -Note repeat lactate in  normal range 1.7 -Treat pneumonia as below  Acute hypoxic respiratory failure-likely due to community-acquired pneumonia, treating as below  Community-acquired PNA-with fever, leukocytosis, lactic acidosis, acute hypoxic respiratory failure and abnormal chest x-ray -IV azithromycin -IV Rocephin -Supplemental oxygen to keep O2 saturation 92% or greater, wean as tolerated  HTN-continue home ARB (pharmacy interchange to irbesartan 150 mg p.o. daily), and hydralazine 50 mg p.o. every 12 hours, with metoprolol IV 5 mg every 6 hours as needed SBP over 160  Myalgias and Suspected Sjogren's-patient had recent myalgias which are much improved after discontinuing statin, PCP notes indicate no plans to resume statin.  Patient has suspected Sjogren's, but manages this with eyedrops, ice cubes, etc. 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  Time spent: 45 minutes  Yahayra Geis Sharlette Dense MD [facility] Pager [phone]  If 7PM-7AM, please contact night-coverage [email or web address] Password [facility]  02/11/2023, 10:32 AM

## 2023-02-11 NOTE — ED Notes (Signed)
Patient placed on 2LNC at this time. RN aware. 

## 2023-02-11 NOTE — ED Provider Notes (Signed)
[place] EMERGENCY DEPARTMENT AT [facility]  Provider Note  CSN: [id number] Arrival date & time: 02/11/23 0157  History Chief Complaint  Patient presents with   Fever    [name] is a 82 y.o. female with history of HTN, Sjogren's syndrome reports about 10 days of 'head cold' with cough, occasional sputum, rhinorrhea and nasal congestion. She has been taking OTC medications with some improvement but began to worsen during the day today with increased cough, feeling SOB, generally weak, subjective fever and chills. She ended up calling EMS and they brought her here for evaluation.    Home Medications Prior to Admission medications   Medication Sig Start Date End Date Taking? Authorizing Provider  acetaminophen (TYLENOL) 500 MG tablet Take 500 mg by mouth every 6 (six) hours as needed for moderate pain.    [provider]  augmented betamethasone dipropionate (DIPROLENE-AF) 0.05 % cream Apply 1 a small amount to affected area twice a day 06/29/21   [provider]  Calcium Carbonate (CALCIUM 500 PO) Take by mouth.    [provider]  Cholecalciferol (VITAMIN D3) 50 MCG (2000 UT) TABS Take 2 tablets by mouth daily.    [provider]  clotrimazole-betamethasone (LOTRISONE) cream Apply topically 2 (two) times daily. 05/09/21   [provider]  hydrALAZINE (APRESOLINE) 50 MG tablet Take 1 tablet (50 mg total) by mouth in the morning and at bedtime. 12 HOURS APART 07/09/22   Alver Sorrow, NP  metoprolol tartrate (LOPRESSOR) 25 MG tablet TAKE 1 TABLET(25 MG) BY MOUTH TWICE DAILY AS NEEDED FOR PALPITATIONS Patient taking differently: as needed. 05/24/22   Sharlene Dory, NP  PREVIDENT 5000 ENAMEL PROTECT 1.1-5 % GEL Take by mouth at bedtime. 01/15/23   [provider]  valsartan (DIOVAN) 160 MG tablet Take 1 tablet (160 mg total) by mouth daily. 05/22/22   Sharlene Dory, NP     Allergies    Lisinopril and  Simvastatin   Review of Systems   Review of Systems Please see HPI for pertinent positives and negatives  Physical Exam BP (!) 122/59   Pulse 91   Temp 99.4 F (37.4 C)   Resp (!) 22   Ht 5\' 9"  (1.753 m)   Wt 89.4 kg   SpO2 94%   BMI 29.09 kg/m   Physical Exam Vitals and nursing note reviewed.  Constitutional:      Appearance: Normal appearance.  HENT:     Head: Normocephalic and atraumatic.     Nose: Nose normal.     Mouth/Throat:     Mouth: Mucous membranes are dry.  Eyes:     Extraocular Movements: Extraocular movements intact.     Conjunctiva/sclera: Conjunctivae normal.  Cardiovascular:     Rate and Rhythm: Tachycardia present.  Pulmonary:     Effort: Pulmonary effort is normal.     Breath sounds: Rhonchi present. No wheezing or rales.  Abdominal:     General: Abdomen is flat.     Palpations: Abdomen is soft.     Tenderness: There is no abdominal tenderness.  Musculoskeletal:        General: No swelling. Normal range of motion.     Cervical back: Neck supple.  Skin:    General: Skin is warm and dry.  Neurological:     General: No focal deficit present.     Mental Status: She is alert.  Psychiatric:        Mood and Affect: Mood normal.  ED Results / Procedures / Treatments   EKG EKG Interpretation  Date/Time:  Monday February 11 2023 02:40:07 EDT Ventricular Rate:  105 PR Interval:  185 QRS Duration: 92 QT Interval:  340 QTC Calculation: 450 R Axis:   29 Text Interpretation: Sinus tachycardia Since last tracing Rate faster Confirmed by Susy Frizzle [phone]) on 02/11/2023 2:43:04 AM  Procedures .Critical Care  Performed by: Pollyann Savoy, MD Authorized by: Pollyann Savoy, MD   Critical care provider statement:    Critical care time (minutes):  30   Critical care time was exclusive of:  Separately billable procedures and treating other patients   Critical care was necessary to treat or prevent imminent or life-threatening  deterioration of the following conditions:  Sepsis and respiratory failure   Critical care was time spent personally by me on the following activities:  Development of treatment plan with patient or surrogate, discussions with consultants, evaluation of patient's response to treatment, examination of patient, ordering and review of laboratory studies, ordering and review of radiographic studies, ordering and performing treatments and interventions, pulse oximetry, re-evaluation of patient's condition and review of old charts   Medications Ordered in the ED Medications  lactated ringers bolus 1,000 mL (0 mLs Intravenous Stopped 02/11/23 0505)  acetaminophen (TYLENOL) tablet 650 mg (650 mg Oral Given 02/11/23 0245)  cefTRIAXone (ROCEPHIN) 1 g in sodium chloride 0.9 % 100 mL IVPB (0 g Intravenous Stopped 02/11/23 0350)  azithromycin (ZITHROMAX) 500 mg in sodium chloride 0.9 % 250 mL IVPB (0 mg Intravenous Stopped 02/11/23 0505)  ondansetron (ZOFRAN) injection 4 mg (4 mg Intravenous Given 02/11/23 0323)  iohexol (OMNIPAQUE) 300 MG/ML solution 100 mL (75 mLs Intravenous Contrast Given 02/11/23 0500)    Initial Impression and Plan  Patient here with fever, cough, congestion ongoing for over a week, but worsening tonight. Has SIRS criteria, suspect a viral source, but will check labs, CXR and UA to eval bacterial source.   ED Course   Clinical Course as of 02/11/23 0645  Mon Feb 11, 2023  0256 CBC with leukocytosis, Will begin Abx for respiratory infection.  [CS]  0306 Coags are normal.  [CS]  0320 CMP is normal. Patient with low SpO2 at rest on Room Air, started on 2L [place].  [CS]  0321 Lactic acid is mildly elevated. Will recheck after IVF.  [CS]  0334 Covid/Flu/RSV swab is negative.  [CS]  [id number] I personally viewed the images from radiology studies and agree with radiologist interpretation: CXR concerning for pneumonia superimposed on chronic lung changes. Recommend CT.  [CS]  0504 UA is normal.  [CS]   0541 Repeat Lactic acid is normalized. Will plan admission, patient amenable to this plan.  [CS]  [id number] Spoke with Dr. Imogene Burn, Hospitalist, who will accept for admission.  [CS]    Clinical Course User Index [CS] Pollyann Savoy, MD     MDM Rules/Calculators/A&P Medical Decision Making Problems Addressed: Acute respiratory failure with hypoxia [facility]): acute illness or injury that poses a threat to life or bodily functions Community acquired pneumonia, unspecified laterality: acute illness or injury that poses a threat to life or bodily functions Sepsis with acute hypoxic respiratory failure without septic shock, due to unspecified organism [facility]): acute illness or injury that poses a threat to life or bodily functions  Amount and/or Complexity of Data Reviewed Labs: ordered. Decision-making details documented in ED Course. Radiology: ordered and independent interpretation performed. Decision-making details documented in ED Course. ECG/medicine tests: ordered and independent interpretation performed.  Decision-making details documented in ED Course.  Risk OTC drugs. Prescription drug management. Decision regarding hospitalization.     Final Clinical Impression(s) / ED Diagnoses Final diagnoses:  Community acquired pneumonia, unspecified laterality  Acute respiratory failure with hypoxia (HCC)  Sepsis with acute hypoxic respiratory failure without septic shock, due to unspecified organism [facility])    Rx / DC Orders ED Discharge Orders     None        Pollyann Savoy, MD 02/11/23 [phone]

## 2023-02-11 NOTE — Progress Notes (Signed)
   Patient Name: [name], [name] DOB: 1940/10/27 MRN: [id number] Transferring facility: [facility] Requesting provider: Bernette Mayers, MD Reason for transfer:  82 yo WF with hx of sjogren's syndrome with 8-10 days of cough/congestion. CT chest shows bilateral low lobe pneumonia, RA sats 88% on RA. now 2 L/min. WBC 14K. started on rocephin/zithromax Going to: [facility] Admission Status: obs Bed Type: med/surg To Do:  [facility] will assume care on arrival to accepting facility. Until arrival, medical decision making responsibilities remain with the EDP.  However, [facility] available 24/7 for questions and assistance.   Nursing staff please page [facility] Admits and Consults [phone]) as soon as the patient arrives to the hospital.  Carollee Herter, DO [facility]

## 2023-02-11 NOTE — ED Triage Notes (Addendum)
Pt c/o "head cold" since Friday. States that she was shaky today and didn't feel well. C/o feeling sob. Pt has had a cough as well. Pt arrived via [facility] where they report her 02 sats were 92% on RA. 02 sat 92-93% on RA on arrival. RT at bedside to evaluate pt.

## 2023-02-12 ENCOUNTER — Telehealth: Payer: Self-pay | Admitting: Family Medicine

## 2023-02-12 DIAGNOSIS — J189 Pneumonia, unspecified organism: Secondary | ICD-10-CM | POA: Diagnosis not present

## 2023-02-12 LAB — CBC
HCT: 28 % — ABNORMAL LOW (ref 36.0–46.0)
Hemoglobin: 9.2 g/dL — ABNORMAL LOW (ref 12.0–15.0)
MCH: 30 pg (ref 26.0–34.0)
MCHC: 32.9 g/dL (ref 30.0–36.0)
MCV: 91.2 fL (ref 80.0–100.0)
Platelets: 177 10*3/uL (ref 150–400)
RBC: 3.07 MIL/uL — ABNORMAL LOW (ref 3.87–5.11)
RDW: 12.9 % (ref 11.5–15.5)
WBC: 16.2 10*3/uL — ABNORMAL HIGH (ref 4.0–10.5)
nRBC: 0 % (ref 0.0–0.2)

## 2023-02-12 LAB — BASIC METABOLIC PANEL
Anion gap: 8 (ref 5–15)
BUN: 13 mg/dL (ref 8–23)
CO2: 22 mmol/L (ref 22–32)
Calcium: 8 mg/dL — ABNORMAL LOW (ref 8.9–10.3)
Chloride: 103 mmol/L (ref 98–111)
Creatinine, Ser: 0.58 mg/dL (ref 0.44–1.00)
GFR, Estimated: >60 mL/min (ref >60)
Glucose, Bld: 105 mg/dL — ABNORMAL HIGH (ref 70–99)
Potassium: 3.1 mmol/L — ABNORMAL LOW (ref 3.5–5.1)
Sodium: 133 mmol/L — ABNORMAL LOW (ref 135–145)

## 2023-02-12 MED ORDER — LEVOFLOXACIN 500 MG PO TABS: 500.0000 mg | ORAL_TABLET | Freq: Every day | ORAL | 0 refills | Status: AC

## 2023-02-12 MED ORDER — GUAIFENESIN-DM 100-10 MG/5ML PO SYRP: 5.0000 mL | ORAL_SOLUTION | ORAL | 0 refills | Status: DC | PRN

## 2023-02-12 MED ORDER — POTASSIUM CHLORIDE CRYS ER 20 MEQ PO TBCR
40.0000 meq | EXTENDED_RELEASE_TABLET | Freq: Once | ORAL | Status: AC
  Administered 2023-02-12: 40 meq via ORAL
  Filled 2023-02-12: qty 2

## 2023-02-12 NOTE — Telephone Encounter (Signed)
Disregard message. Due to last minute cancellation, pt has been scheduled for 02/15/23 @ 11 am w/ Ruthine Dose.

## 2023-02-12 NOTE — Plan of Care (Signed)

## 2023-02-12 NOTE — Discharge Instructions (Signed)
Follow with Jillian Sow, MD in 5-7 days  Please get a complete blood count and chemistry panel checked by your Primary MD at your next visit, and again as instructed by your Primary MD. Please get your medications reviewed and adjusted by your Primary MD.  Please request your Primary MD to go over all Hospital Tests and Procedure/Radiological results at the follow up, please get all Hospital records sent to your Prim MD by signing hospital release before you go home.  In some cases, there will be blood work, cultures and biopsy results pending at the time of your discharge. Please request that your primary care M.D. goes through all the records of your hospital data and follows up on these results.  If you had Pneumonia of Lung problems at the Hospital: Please get a 2 view Chest X ray done in 6-8 weeks after hospital discharge or sooner if instructed by your Primary MD.  If you have Congestive Heart Failure: Please call your Cardiologist or Primary MD anytime you have any of the following symptoms:  1) 3 pound weight gain in 24 hours or 5 pounds in 1 week  2) shortness of breath, with or without a dry hacking cough  3) swelling in the hands, feet or stomach  4) if you have to sleep on extra pillows at night in order to breathe  Follow cardiac low salt diet and 1.5 lit/day fluid restriction.  If you have diabetes Accuchecks 4 times/day, Once in AM empty stomach and then before each meal. Log in all results and show them to your primary doctor at your next visit. If any glucose reading is under 80 or above 300 call your primary MD immediately.  If you have Seizure/Convulsions/Epilepsy: Please do not drive, operate heavy machinery, participate in activities at heights or participate in high speed sports until you have seen by Primary MD or a Neurologist and advised to do so again. Per [facility] statutes, patients with seizures are not allowed to drive until they have been  seizure-free for six months.  Use caution when using heavy equipment or power tools. Avoid working on ladders or at heights. Take showers instead of baths. Ensure the water temperature is not too high on the home water heater. Do not go swimming alone. Do not lock yourself in a room alone (i.e. bathroom). When caring for infants or small children, sit down when holding, feeding, or changing them to minimize risk of injury to the child in the event you have a seizure. Maintain good sleep hygiene. Avoid alcohol.   If you had Gastrointestinal Bleeding: Please ask your Primary MD to check a complete blood count within one week of discharge or at your next visit. Your endoscopic/colonoscopic biopsies that are pending at the time of discharge, will also need to followed by your Primary MD.  Get Medicines reviewed and adjusted. Please take all your medications with you for your next visit with your Primary MD  Please request your Primary MD to go over all hospital tests and procedure/radiological results at the follow up, please ask your Primary MD to get all Hospital records sent to his/her office.  If you experience worsening of your admission symptoms, develop shortness of breath, life threatening emergency, suicidal or homicidal thoughts you must seek medical attention immediately by calling 911 or calling your MD immediately  if symptoms less severe.  You must read complete instructions/literature along with all the possible adverse reactions/side effects for all the Medicines you  take and that have been prescribed to you. Take any new Medicines after you have completely understood and accpet all the possible adverse reactions/side effects.   Do not drive or operate heavy machinery when taking Pain medications.   Do not take more than prescribed Pain, Sleep and Anxiety Medications  Special Instructions: If you have smoked or chewed Tobacco  in the last 2 yrs please stop smoking, stop any regular  Alcohol  and or any Recreational drug use.  Wear Seat belts while driving.  Please note You were cared for by a hospitalist during your hospital stay. If you have any questions about your discharge medications or the care you received while you were in the hospital after you are discharged, you can call the unit and asked to speak with the hospitalist on call if the hospitalist that took care of you is not available. Once you are discharged, your primary care physician will handle any further medical issues. Please note that NO REFILLS for any discharge medications will be authorized once you are discharged, as it is imperative that you return to your primary care physician (or establish a relationship with a primary care physician if you do not have one) for your aftercare needs so that they can reassess your need for medications and monitor your lab values.  You can reach the hospitalist office at phone [phone] or fax [phone]   If you do not have a primary care physician, you can call [phone] for a physician referral.  Activity: As tolerated with Full fall precautions use walker/cane & assistance as needed    Diet: regular  Disposition Home

## 2023-02-12 NOTE — Telephone Encounter (Signed)
Noted  

## 2023-02-12 NOTE — TOC CM/SW Note (Signed)
Transition of Care [facility]) - Inpatient Brief Assessment   Patient Details  Name: [name] MRN: [id number] Date of Birth: 1940/12/29  [facility]) CM/SW Contact:    Otelia Santee, LCSW Phone Number: 02/12/2023, 10:13 AM   Clinical Narrative: Chart reviewed. No TOC needs identified.    Transition of Care Asessment: Insurance and Status: Insurance coverage has been reviewed Patient has primary care physician: Yes Home environment has been reviewed: Home alone Prior level of function:: Independent/modified independent Prior/Current Home Services: No current home services Social Determinants of Health Reivew: SDOH reviewed no interventions necessary Readmission risk has been reviewed: Yes Transition of care needs: no transition of care needs at this time

## 2023-02-12 NOTE — Discharge Summary (Signed)
Physician Discharge Summary  FRANKYE SCHWEGEL QVZ:[id number] DOB: 11/01/1940 DOA: 02/11/2023  PCP: Jeani Sow, MD  Admit date: 02/11/2023 Discharge date: 02/12/2023  Admitted From: home Disposition:  home  Recommendations for Outpatient Follow-up:  Follow up with PCP in 1-2 weeks Follow up with Dr Bertis Ruddy as scheduled  Home Health: none Equipment/Devices: none  Discharge Condition: stable CODE STATUS: Full code Diet Orders (From admission, onward)     Start     Ordered   02/11/23 1032  Diet regular Room service appropriate? Yes; Fluid consistency: Thin  Diet effective now       Question Answer Comment  Room service appropriate? Yes   Fluid consistency: Thin      02/11/23 1032           HPI: Per admitting MD, [name] is a 82 y.o. 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  Patient states she was in her usual state of health until about 7 days ago, she started having congestion, this later developed in the cough and about 3 days ago she started feeling short of breath.  She was also having some subjective fevers and chills.  She lives alone, so decided to call EMS late last night.  Denies any abdominal pain, vomiting, nausea, diarrhea.   Hospital Course / Discharge diagnoses: Problem Sepsis due to community-acquired pneumonia -patient was admitted to the hospital and placed on IV antibiotics.  She defervesced rapidly, initially required oxygen for mild hypoxemia and has been weaned off to room air.  She has been able to walk laps in the hallway on room air maintaining oxygen sats above 91%.  Clinically she has returned to baseline.  She is asking to go home as she has an important appointment to her back that she needs to attend to.  She will be converted to oral antibiotics, antitussives and be discharged home in stable condition.  Discussed  with the patient that her WBC is still a little bit elevated and she is aware of that.  She will monitor her symptoms very carefully at home, and if she has recurrent fever, chills she will return to the hospital.  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  Imaging during this admission did show some borderline enlarged mediastinal lymph nodes, nonspecific. Essential hypertension-resume home medications   Discharge Instructions   Allergies as of 02/12/2023       Reactions   Lisinopril    Simvastatin Itching        Medication List     TAKE these medications    acetaminophen 500 MG tablet Commonly known as: TYLENOL Take 500 mg by mouth every 6 (six) hours as needed for moderate pain.   B-12 PO Take 1 tablet by mouth daily.   CALCIUM 500 PO Take 500 mg by mouth every other day.   DRY EYES OP Apply 1 drop to eye as needed (Dry eye).   guaiFENesin-dextromethorphan 100-10 MG/5ML syrup Commonly known as: ROBITUSSIN DM Take 5 mLs by mouth every 4 (four) hours as needed for cough.   hydrALAZINE 50 MG tablet Commonly known as: APRESOLINE Take 1 tablet (50 mg total) by mouth in the morning and at bedtime. 12 HOURS APART   levofloxacin 500 MG tablet Commonly known as: Levaquin Take 1 tablet (500 mg total) by mouth daily  for 5 days.   metoprolol tartrate 25 MG tablet Commonly known as: LOPRESSOR TAKE 1 TABLET(25 MG) BY MOUTH TWICE DAILY AS NEEDED FOR PALPITATIONS What changed: See the new instructions.   PreviDent 5000 Enamel Protect 1.1-5 % Gel Generic drug: Sod Fluoride-Potassium Nitrate 1 Application by Other route at bedtime.   valsartan 160 MG tablet Commonly known as: Diovan Take 1 tablet (160 mg total) by mouth daily.   Vitamin D3 50 MCG (2000 UT) Tabs Take 1 tablet by mouth daily.       Consultations: none  Procedures/Studies:  CT  Chest W Contrast  Result Date: 02/11/2023 CLINICAL DATA:  82 year old female with history of pneumonia. History of lymphoma and colon cancer. * Tracking Code: BO * EXAM: CT CHEST WITH CONTRAST TECHNIQUE: Multidetector CT imaging of the chest was performed during intravenous contrast administration. RADIATION DOSE REDUCTION: This exam was performed according to the departmental dose-optimization program which includes automated exposure control, adjustment of the mA and/or kV according to patient size and/or use of iterative reconstruction technique. CONTRAST:  75mL OMNIPAQUE IOHEXOL 300 MG/ML  SOLN COMPARISON:  Chest CT 03/05/2022. FINDINGS: Cardiovascular: Heart size is normal. There is no significant pericardial fluid, thickening or pericardial calcification. There is aortic atherosclerosis, as well as atherosclerosis of the great vessels of the mediastinum and the coronary arteries, including calcified atherosclerotic plaque in the left main and left anterior descending coronary arteries. Severe calcifications of the aortic valve. Moderate calcifications of the mitral annulus. Mediastinum/Nodes: Several prominent borderline enlarged middle mediastinal lymph nodes are noted in the paraesophageal nodal station measuring up to 1.1 cm in short axis (axial image 104 of series 2) and in the subcarinal nodal station measuring up to 1.1 cm in short axis (axial image 82 of series 2), nonspecific. No pathologically enlarged hilar lymph nodes. Small hiatal hernia. Lungs/Pleura: Patchy areas of ground-glass attenuation and consolidative airspace disease are noted throughout the lower lobes of the lungs bilaterally. Trace bilateral pleural effusions. No pneumothorax. Numerous thin-walled cysts are again noted scattered throughout the lungs bilaterally, most evident in the mid to lower lungs. Upper Abdomen: In the upper pole of the right kidney there is a 2.4 x 2.0 cm low-attenuation lesion (axial image 156 of series 2)  which appears to have a internal septation which has some thickening along the inferior margin of the septation which measures up to 4 mm in thickness (axial image 159 of series 2). Atherosclerotic calcifications in the abdominal aorta. Musculoskeletal: There are no aggressive appearing lytic or blastic lesions noted in the visualized portions of the skeleton. IMPRESSION: 1. Patchy areas of airspace consolidation which appears isolated to the lower lobes of the lungs bilaterally. This could reflect multilobar bronchopneumonia, or could indicate sequela of recent aspiration. 2. Borderline enlarged mediastinal lymph nodes, nonspecific, but concerning for potential recurrent malignancy in this patient with prior history of lymphoma and colon cancer. Further clinical evaluation for recurrent malignancy is suggested. 3. Indeterminate mildly complex cystic lesion in the upper pole of the right kidney which appears to have a mildly thickened internal septation. Definitive characterization with follow-up nonemergent outpatient abdominal MRI with and without IV gadolinium is recommended in the near future to better evaluate this finding. 4. Aortic atherosclerosis, in addition to left main and left anterior descending coronary artery disease. 5. There are severe calcifications of the aortic valve and moderate calcifications of the mitral annulus. Echocardiographic correlation for evaluation of potential valvular dysfunction may be warranted if clinically indicated. 6. 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 Aortic Atherosclerosis (ICD10-I70.0). Electronically Signed   By: Trudie Reed M.D.   On: 02/11/2023 05:27   DG Chest 2 View  Result Date: 02/11/2023 CLINICAL DATA:  Shortness of breath and cough EXAM: CHEST - 2 VIEW COMPARISON:  Chest radiograph 11/08/2020 and CT chest 03/05/2022  FINDINGS: Stable cardiomediastinal silhouette. Aortic atherosclerotic calcification. Chronic bronchitic change. There is new thickening about a few of the cysts in the left lower lung on AP view without correlate on lateral view. Superimposed infection/pneumonia is difficult to exclude. No pleural effusion or pneumothorax. No displaced rib fractures. IMPRESSION: Thickening about the cysts in the left lower lung may be due to pneumonia/superimposed infection. Consider chest CT for further evaluation. Electronically Signed   By: Minerva Fester M.D.   On: 02/11/2023 03:57     Subjective: - no chest pain, shortness of breath, no abdominal pain, nausea or vomiting.   Discharge Exam: BP (!) 109/41 (BP Location: Right Arm)   Pulse 88   Temp 99.1 F (37.3 C)   Resp 16   Ht 5\' 9"  (1.753 m)   Wt 90.7 kg   SpO2 90%   BMI 29.53 kg/m   General: Pt is alert, awake, not in acute distress Cardiovascular: RRR, S1/S2 +, no rubs, no gallops Respiratory: CTA bilaterally, no wheezing, no rhonchi Abdominal: Soft, NT, ND, bowel sounds + Extremities: no edema, no cyanosis    The results of significant diagnostics from this hospitalization (including imaging, microbiology, ancillary and laboratory) are listed below for reference.     Microbiology: Recent Results (from the past 240 hour(s))  Blood Culture (routine x 2)     Status: None (Preliminary result)   Collection Time: 02/11/23  2:43 AM   Specimen: BLOOD  Result Value Ref Range Status   Specimen Description   Final    BLOOD BLOOD RIGHT ARM Performed at [facility], [address], [place], [place] [address]    Special Requests   Final    BOTTLES DRAWN AEROBIC AND ANAEROBIC Blood Culture adequate volume Performed at [facility], [address] [place], [place] [address]    Culture   Final    NO GROWTH < 24 HOURS Performed at [facility], [address], [place], [place] [address]    Report  Status PENDING  Incomplete  Blood Culture (routine x 2)     Status: None (Preliminary result)   Collection Time: 02/11/23  2:43 AM   Specimen: BLOOD  Result Value Ref Range Status   Specimen Description   Final    BLOOD BLOOD LEFT ARM Performed at [facility], [address], [place], [place] [address]    Special Requests   Final    BOTTLES DRAWN AEROBIC AND ANAEROBIC Blood Culture adequate volume Performed at [facility], [address] [place], [place] [address]    Culture   Final    NO GROWTH < 24 HOURS Performed at [facility], [address] [place], [place] [address]    Report Status PENDING  Incomplete  Resp panel by RT-PCR (RSV, Flu A&B, Covid) Anterior Nasal Swab     Status: None   Collection Time: 02/11/23  2:43 AM   Specimen: Anterior Nasal Swab  Result Value Ref Range Status   SARS Coronavirus 2 by RT PCR NEGATIVE NEGATIVE Final    Comment: (NOTE) SARS-CoV-2 target nucleic  acids are NOT DETECTED.  The SARS-CoV-2 RNA is generally detectable in upper respiratory specimens during the acute phase of infection. The lowest concentration of SARS-CoV-2 viral copies this assay can detect is 138 copies/mL. A negative result does not preclude SARS-Cov-2 infection and should not be used as the sole basis for treatment or other patient management decisions. A negative result may occur with  improper specimen collection/handling, submission of specimen other than nasopharyngeal swab, presence of viral mutation(s) within the areas targeted by this assay, and inadequate number of viral copies(<138 copies/mL). A negative result must be combined with clinical observations, patient history, and epidemiological information. The expected result is Negative.  Fact Sheet for Patients:  BloggerCourse.com  Fact Sheet for Healthcare Providers:  SeriousBroker.it  This test is no t yet  approved or cleared by the [place] FDA and  has been authorized for detection and/or diagnosis of SARS-CoV-2 by FDA under an Emergency Use Authorization (EUA). This EUA will remain  in effect (meaning this test can be used) for the duration of the COVID-19 declaration under Section 564(b)(1) of the Act, 21 U.S.C.section 360bbb-3(b)(1), unless the authorization is terminated  or revoked sooner.       Influenza A by PCR NEGATIVE NEGATIVE Final   Influenza B by PCR NEGATIVE NEGATIVE Final    Comment: (NOTE) The Xpert Xpress SARS-CoV-2/FLU/RSV plus assay is intended as an aid in the diagnosis of influenza from Nasopharyngeal swab specimens and should not be used as a sole basis for treatment. Nasal washings and aspirates are unacceptable for Xpert Xpress SARS-CoV-2/FLU/RSV testing.  Fact Sheet for Patients: BloggerCourse.com  Fact Sheet for Healthcare Providers: SeriousBroker.it  This test is not yet approved or cleared by the [place] FDA and has been authorized for detection and/or diagnosis of SARS-CoV-2 by FDA under an Emergency Use Authorization (EUA). This EUA will remain in effect (meaning this test can be used) for the duration of the COVID-19 declaration under Section 564(b)(1) of the Act, 21 U.S.C. section 360bbb-3(b)(1), unless the authorization is terminated or revoked.     Resp Syncytial Virus by PCR NEGATIVE NEGATIVE Final    Comment: (NOTE) Fact Sheet for Patients: BloggerCourse.com  Fact Sheet for Healthcare Providers: SeriousBroker.it  This test is not yet approved or cleared by the [place] FDA and has been authorized for detection and/or diagnosis of SARS-CoV-2 by FDA under an Emergency Use Authorization (EUA). This EUA will remain in effect (meaning this test can be used) for the duration of the COVID-19 declaration under Section 564(b)(1) of  the Act, 21 U.S.C. section 360bbb-3(b)(1), unless the authorization is terminated or revoked.  Performed at [facility], [address], [place], [place] [address]      Labs: Basic Metabolic Panel: Recent Labs  Lab 02/11/23 0243 02/12/23 0348  NA 135 133*  K 4.1 3.1*  CL 100 103  CO2 26 22  GLUCOSE 123* 105*  BUN 13 13  CREATININE 0.66 0.58  CALCIUM 9.3 8.0*   Liver Function Tests: Recent Labs  Lab 02/11/23 0243  AST 18  ALT 14  ALKPHOS 71  BILITOT 0.4  PROT 7.6  ALBUMIN 4.2   CBC: Recent Labs  Lab 02/11/23 0243 02/12/23 0348  WBC 14.0* 16.2*  NEUTROABS 12.8*  --   HGB 11.8* 9.2*  HCT 35.4* 28.0*  MCV 88.5 91.2  PLT 195 177   CBG: No results for input(s): "GLUCAP" in the last 168 hours. Hgb A1c No results for input(s): "HGBA1C" in the last  72 hours. Lipid Profile No results for input(s): "CHOL", "HDL", "LDLCALC", "TRIG", "CHOLHDL", "LDLDIRECT" in the last 72 hours. Thyroid function studies No results for input(s): "TSH", "T4TOTAL", "T3FREE", "THYROIDAB" in the last 72 hours.  Invalid input(s): "FREET3" Urinalysis    Component Value Date/Time   COLORURINE COLORLESS (A) 02/11/2023 0243   APPEARANCEUR CLEAR 02/11/2023 0243   LABSPEC 1.012 02/11/2023 0243   PHURINE 7.5 02/11/2023 0243   GLUCOSEU NEGATIVE 02/11/2023 0243   HGBUR NEGATIVE 02/11/2023 0243   BILIRUBINUR NEGATIVE 02/11/2023 0243   BILIRUBINUR n 05/01/2012 0937   KETONESUR NEGATIVE 02/11/2023 0243   PROTEINUR NEGATIVE 02/11/2023 0243   UROBILINOGEN 0.2 05/01/2012 0937   NITRITE NEGATIVE 02/11/2023 0243   LEUKOCYTESUR NEGATIVE 02/11/2023 0243    FURTHER DISCHARGE INSTRUCTIONS:   Get Medicines reviewed and adjusted: Please take all your medications with you for your next visit with your Primary MD   Laboratory/radiological data: Please request your Primary MD to go over all hospital tests and procedure/radiological results at the follow up, please ask your  Primary MD to get all Hospital records sent to his/her office.   In some cases, they will be blood work, cultures and biopsy results pending at the time of your discharge. Please request that your primary care M.D. goes through all the records of your hospital data and follows up on these results.   Also Note the following: If you experience worsening of your admission symptoms, develop shortness of breath, life threatening emergency, suicidal or homicidal thoughts you must seek medical attention immediately by calling 911 or calling your MD immediately  if symptoms less severe.   You must read complete instructions/literature along with all the possible adverse reactions/side effects for all the Medicines you take and that have been prescribed to you. Take any new Medicines after you have completely understood and accpet all the possible adverse reactions/side effects.    Do not drive when taking Pain medications or sleeping medications (Benzodaizepines)   Do not take more than prescribed Pain, Sleep and Anxiety Medications. It is not advisable to combine anxiety,sleep and pain medications without talking with your primary care practitioner   Special Instructions: If you have smoked or chewed Tobacco  in the last 2 yrs please stop smoking, stop any regular Alcohol  and or any Recreational drug use.   Wear Seat belts while driving.   Please note: You were cared for by a hospitalist during your hospital stay. Once you are discharged, your primary care physician will handle any further medical issues. Please note that NO REFILLS for any discharge medications will be authorized once you are discharged, as it is imperative that you return to your primary care physician (or establish a relationship with a primary care physician if you do not have one) for your post hospital discharge needs so that they can reassess your need for medications and monitor your lab values.  Time coordinating discharge: 35  minutes  SIGNED:  Pamella Pert, MD, PhD 02/12/2023, 10:31 AM

## 2023-02-12 NOTE — Telephone Encounter (Signed)
Pt states she was admitted to hospital for pneumonia on 6/24 and discharged today. States the advised her to follow up with PCP within 5 days. PCP currently has nothing besides same day slots until 7/10. Can pt be worked in sooner? Please Advise.

## 2023-02-13 LAB — CULTURE, BLOOD (ROUTINE X 2): Culture: NO GROWTH

## 2023-02-14 LAB — CULTURE, BLOOD (ROUTINE X 2)

## 2023-02-15 ENCOUNTER — Encounter: Payer: Self-pay | Admitting: Family Medicine

## 2023-02-15 ENCOUNTER — Telehealth: Payer: Self-pay

## 2023-02-15 ENCOUNTER — Ambulatory Visit (INDEPENDENT_AMBULATORY_CARE_PROVIDER_SITE_OTHER): Payer: PPO | Admitting: Family Medicine

## 2023-02-15 VITALS — BP 160/80 | HR 88 | Temp 97.5°F | Ht 69.0 in

## 2023-02-15 DIAGNOSIS — C8588 Other specified types of non-Hodgkin lymphoma, lymph nodes of multiple sites: Secondary | ICD-10-CM

## 2023-02-15 DIAGNOSIS — I10 Essential (primary) hypertension: Secondary | ICD-10-CM | POA: Diagnosis not present

## 2023-02-15 DIAGNOSIS — J189 Pneumonia, unspecified organism: Secondary | ICD-10-CM

## 2023-02-15 DIAGNOSIS — R942 Abnormal results of pulmonary function studies: Secondary | ICD-10-CM | POA: Diagnosis not present

## 2023-02-15 LAB — COMPREHENSIVE METABOLIC PANEL
ALT: 19 U/L (ref 0–35)
AST: 17 U/L (ref 0–37)
Albumin: 3.8 g/dL (ref 3.5–5.2)
Alkaline Phosphatase: 88 U/L (ref 39–117)
BUN: 11 mg/dL (ref 6–23)
CO2: 25 mEq/L (ref 19–32)
Calcium: 9 mg/dL (ref 8.4–10.5)
Chloride: 102 mEq/L (ref 96–112)
Creatinine, Ser: 0.54 mg/dL (ref 0.40–1.20)
GFR: 85.93 mL/min (ref >60.00)
Glucose, Bld: 107 mg/dL — ABNORMAL HIGH (ref 70–99)
Potassium: 3.9 mEq/L (ref 3.5–5.1)
Sodium: 134 mEq/L — ABNORMAL LOW (ref 135–145)
Total Bilirubin: 0.4 mg/dL (ref 0.2–1.2)
Total Protein: 6.7 g/dL (ref 6.0–8.3)

## 2023-02-15 LAB — CBC WITH DIFFERENTIAL/PLATELET
Basophils Absolute: 0 10*3/uL (ref 0.0–0.1)
Basophils Relative: 0.5 % (ref 0.0–3.0)
Eosinophils Absolute: 0.1 10*3/uL (ref 0.0–0.7)
Eosinophils Relative: 1.7 % (ref 0.0–5.0)
HCT: 32.8 % — ABNORMAL LOW (ref 36.0–46.0)
Hemoglobin: 10.9 g/dL — ABNORMAL LOW (ref 12.0–15.0)
Lymphocytes Relative: 9.3 % — ABNORMAL LOW (ref 12.0–46.0)
Lymphs Abs: 0.8 10*3/uL (ref 0.7–4.0)
MCHC: 33.3 g/dL (ref 30.0–36.0)
MCV: 87.9 fl (ref 78.0–100.0)
Monocytes Absolute: 1.1 10*3/uL — ABNORMAL HIGH (ref 0.1–1.0)
Monocytes Relative: 12.9 % — ABNORMAL HIGH (ref 3.0–12.0)
Neutro Abs: 6.2 10*3/uL (ref 1.4–7.7)
Neutrophils Relative %: 75.6 % (ref 43.0–77.0)
Platelets: 292 10*3/uL (ref 150.0–400.0)
RBC: 3.73 Mil/uL — ABNORMAL LOW (ref 3.87–5.11)
RDW: 13.5 % (ref 11.5–15.5)
WBC: 8.2 10*3/uL (ref 4.0–10.5)

## 2023-02-15 LAB — CULTURE, BLOOD (ROUTINE X 2)
Culture: NO GROWTH
Special Requests: ADEQUATE
Special Requests: ADEQUATE

## 2023-02-15 NOTE — Progress Notes (Signed)
Subjective:     Patient ID: [name], female    DOB: 1940-09-29, 82 y.o.   MRN: [id number]  Chief Complaint  Patient presents with   Hospitalization Follow-up    Follow-up from hospital on 02/11/23 for pneumonia, still feeling pretty awful     HPI Pt hosp 6/24-6/25 for CAP/sepsis. Had 7 days PTA cough, congestion, then sob so ER.  Was having some f/c as well.    Placed on IV abx.  O2 was low, but then improved.  Pt had things to do so wanted to leave earlier despite elevated wbc.  Told to monitor closely.   Also, K was a little low-replaced.  Flu/covid/rsv negative.  Wbc 14-16  hgb 11.8-9.2, lactic acid 2.3-1.7.  cxr-cysts LLL.  CT chest IMPRESSION: 1. Patchy areas of airspace consolidation which appears isolated to the lower lobes of the lungs bilaterally. This could reflect multilobar bronchopneumonia, or could indicate sequela of recent aspiration. 2. Borderline enlarged mediastinal lymph nodes, nonspecific, but concerning for potential recurrent malignancy in this patient with prior history of lymphoma and colon cancer. Further clinical evaluation for recurrent malignancy is suggested. 3. Indeterminate mildly complex cystic lesion in the upper pole of the right kidney which appears to have a mildly thickened internal septation. Definitive characterization with follow-up nonemergent outpatient abdominal MRI with and without IV gadolinium is recommended in the near future to better evaluate this finding. 4. Aortic atherosclerosis, in addition to left main and left anterior descending coronary artery disease. 5. There are severe calcifications of the aortic valve and moderate calcifications of the mitral annulus. Echocardiographic correlation for evaluation of potential valvular dysfunction may be warranted if clinically indicated. 6. 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   Pt still feeling poorly-thinks the antibiotics. Bruise on R hand for 2 days  feels like something tight in mid chest  w/inspiration.   Felt better yesterday till took abx.  No cough now.  Had "choking cough" once yesterday.  Poss fever yesterday.  No chills.  No v/d.  Had BM yesterday.head is "tight". Minor congestion.    Was sick 7 days but "ok", then on 6/23-at 6pm and kept worsening so called EMS close to [place].   Health Maintenance Due  Topic Date Due   DTaP/Tdap/Td (2 - Tdap) 04/29/2016   Colonoscopy  03/05/2023    Past Medical History:  Diagnosis Date   Anxiety    Arthritis    right thumb CMC arthritis   Asthmatic bronchitis 2017   Cancer (HCC)    -NHL R parotid   Cataract    bilateral small- removed both eyes    Colon cancer (HCC)    stage 1- at appendiceal orifice    Complication of anesthesia    has dry mouth anyway from shograns-sore throats post op   Coronary artery calcification seen on CAT scan 11/14/2019   Depression    since 1982    Diverticulosis    Fibromyalgia    GERD (gastroesophageal reflux disease)    past hx    Heart murmur    Hyperlipidemia    controlled - metabolic syndrome per pt- goes up and down    Hypertension    Lung infiltrate on CT 09/14/2015   Meningioma (HCC) 11/2009   right post parietal 16mm - stable 11/2009 MRI   Nephrolithiasis    Nephrolithiasis    Neuromuscular disorder (HCC)  fibromyalgia   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   Pure hypercholesterolemia 11/14/2019   Sjogren's disease (HCC)     Past Surgical History:  Procedure Laterality Date   ABDOMINAL HYSTERECTOMY     APPENDECTOMY  2008   ARTERY BIOPSY  02/03/2013   Procedure: MINOR BIOPSY TEMPORAL ARTERY;  Surgeon: Darletta Moll, MD;  Location: [facility];  Service: ENT;;   COLONOSCOPY     KIDNEY STONE SURGERY  1974   LEFT HEART CATH AND CORONARY ANGIOGRAPHY N/A 12/01/2019    Procedure: LEFT HEART CATH AND CORONARY ANGIOGRAPHY;  Surgeon: Lennette Bihari, MD;  Location: [facility];  Service: Cardiovascular;  Laterality: N/A;   MASS EXCISION  2008   abd-benign   PAROTIDECTOMY Right 08/01/2015   Procedure: RIGHT PAROTIDECTOMY;  Surgeon: Newman Pies, MD;  Location: [facility];  Service: ENT;  Laterality: Right;   PAROTIDECTOMY Left 04/04/2021   Procedure: LEFT PAROTIDECTOMY;  Surgeon: Newman Pies, MD;  Location: [facility];  Service: ENT;  Laterality: Left;   POLYPECTOMY     TONSILLECTOMY     VAGINAL HYSTERECTOMY  1989     Current Outpatient Medications:    acetaminophen (TYLENOL) 500 MG tablet, Take 500 mg by mouth every 6 (six) hours as needed for moderate pain., Disp: , Rfl:    Artificial Tear Ointment (DRY EYES OP), Apply 1 drop to eye as needed (Dry eye)., Disp: , Rfl:    Calcium Carbonate (CALCIUM 500 PO), Take 500 mg by mouth every other day., Disp: , Rfl:    Cholecalciferol (VITAMIN D3) 50 MCG (2000 UT) TABS, Take 1 tablet by mouth daily., Disp: , Rfl:    Cyanocobalamin (B-12 PO), Take 1 tablet by mouth daily., Disp: , Rfl:    guaiFENesin-dextromethorphan (ROBITUSSIN DM) 100-10 MG/5ML syrup, Take 5 mLs by mouth every 4 (four) hours as needed for cough., Disp: 118 mL, Rfl: 0   hydrALAZINE (APRESOLINE) 50 MG tablet, Take 1 tablet (50 mg total) by mouth in the morning and at bedtime. 12 HOURS APART, Disp: 180 tablet, Rfl: 3   levofloxacin (LEVAQUIN) 500 MG tablet, Take 1 tablet (500 mg total) by mouth daily for 5 days., Disp: 5 tablet, Rfl: 0   metoprolol tartrate (LOPRESSOR) 25 MG tablet, TAKE 1 TABLET(25 MG) BY MOUTH TWICE DAILY AS NEEDED FOR PALPITATIONS (Patient taking differently: Take 12.5 mg by mouth 2 (two) times daily as needed (Palpitations).), Disp: 180 tablet, Rfl: 3   PREVIDENT 5000 ENAMEL PROTECT 1.1-5 % GEL, 1 Application by Other route at bedtime., Disp: , Rfl:    valsartan (DIOVAN) 160 MG tablet, Take 1 tablet (160  mg total) by mouth daily., Disp: 90 tablet, Rfl: 3  Allergies  Allergen Reactions   Lisinopril    Simvastatin Itching   ROS neg/noncontributory except as noted HPI/below      Objective:     BP (!) 160/80 (BP Location: Left Arm, Patient Position: Sitting, Cuff Size: Normal)   Pulse 88   Temp (!) 97.5 F (36.4 C) (Temporal)   Ht 5\' 9"  (1.753 m)   SpO2 94%   BMI 29.53 kg/m  Wt Readings from Last 3 Encounters:  02/11/23 199 lb 15.3 oz (90.7 kg)  01/30/23 198 lb 4 oz (89.9 kg)  01/23/23 198 lb 4 oz (89.9 kg)   Kortakoff at 160 and 120 (louder) Physical Exam   Gen: WDWN NAD  HEENT: NCAT, conjunctiva not injected, sclera nonicteric NECK:  supple,  no thyromegaly, no nodes, no carotid bruits CARDIAC: RRR, S1S2+,2/6 murmur.  LUNGS: CTAB. No wheezes but opening pop at B bases ABDOMEN:  BS+, soft, NTND, No HSM, no masses EXT:  no edema MSK: no gross abnormalities.  NEURO: A&O x3.  CN II-XII intact.  PSYCH: normal mood. Good eye contact  Reviewed hosp records-educated pt, explained things to her, answered questions.  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  Still feeling poorly but some things better.  Has 2 more days of Levaquin and she will take that rather than changing.  Check cbcd,cmp as abn in hospital.  Also, get CXR 1 month Abn lung scan-h/o lymphoma.  Rad suggested f/u onc-advised pt to see if can get sooner appt.  I will tag Dr. Bertis Ruddy as well HTN-chronic.  Not controlled here-pt not feel good so continue to monitor.  H/o lymphoma, colon ca-f/u onc.    Return for as sch.  Angelena Sole, MD

## 2023-02-15 NOTE — Telephone Encounter (Signed)
She called and left a message requesting earlier appt. She was recently in the hospital for pneumonia. She followed up with PCP today and was told to call office for earlier appt due to 6/24 CT scan.

## 2023-02-15 NOTE — Telephone Encounter (Signed)
I spoke with PCP today and recommended her to see Dr. Everardo All her pulmonologist who has been seeing her I think the changes are due to pneumonia

## 2023-02-15 NOTE — Patient Instructions (Addendum)
Dr. Waynette Buttery sooner about CT findings  Dr. Noralee Chars her  get X-ray/labs at [facility].  [address]  hours 8=M-F 8:30-5.  closed 12:30-1 lunch   get done end of July/early August

## 2023-02-15 NOTE — Telephone Encounter (Signed)
Called and given below message. She verbalized understanding and will cal PCP about see Dr. Everardo All.

## 2023-02-16 LAB — CULTURE, BLOOD (ROUTINE X 2)

## 2023-02-17 NOTE — Progress Notes (Signed)
Labs better but I am concerned with the mild anemia. Due to her history of low grade colon ca, I would like her to do stool card(find a time that works for all of [place] to do a quick rectal exam(not necessarily an appt?)   would also like to check cbcd,iron studies, b12 in 2 wks.

## 2023-02-19 ENCOUNTER — Other Ambulatory Visit: Payer: Self-pay | Admitting: *Deleted

## 2023-02-19 DIAGNOSIS — D649 Anemia, unspecified: Secondary | ICD-10-CM

## 2023-02-26 NOTE — Progress Notes (Signed)
Cardiology Office Note:   Date:  03/01/2023  ID:  [name], DOB 1940-12-30, MRN [id number]  History of Present Illness:   [name] is a 82 y.o. female with a hx of palpitations, HTN, HLD, CAD, cystic lung disease, GERD, intracranial meningioma, Sjogren's syndrome, non-Hodgkin's lymphoma, fibromyalgia, colon cancer, and anxiety who now presents to clinic for follow-up.   In 2021 reported symptoms concerning for angina with cardiac catheterization 11/2019 with minimal disease (10% mid LAD) with LVEF 60-65%. Echo with LVEF 70-75%, G1DD, aortic valve sclerosis without stenosis. She has hypertension with intolerance to multiple medications.    Echo 06/08/22 for palpitations showed LVEF 72%, gr2DD, mild LVH, RV normal, trivial MR. Monitor with predominantly NSR with 30 episodes of SVT with longest 11.3 seconds. None of her episodes of SVT were triggered. Labs including CMP, TSH, magnesium normal. She was started on Metoprolol tartrate 25mg  BID PRN with the approval of her pulmonologist.  Was admitted in 01/2023 for sepsis secondary to pneumonia.   Today, the patient overall feels okay. States she is struggling with family stress with her sister and her blood pressure is elevated in this setting. Otherwise, she feels well with no chest pain, SOB, orthopnea, PND or LE edema.   Did not take her blood pressure medication this morning.   Past Medical History:  Diagnosis Date   Anxiety    Arthritis    right thumb CMC arthritis   Asthmatic bronchitis 2017   Cancer (HCC)    -NHL R parotid   Cataract    bilateral small- removed both eyes    Colon cancer (HCC)    stage 1- at appendiceal orifice    Complication of anesthesia    has dry mouth anyway from shograns-sore throats post op   Coronary artery calcification seen on CAT scan 11/14/2019   Depression    since 1982    Diverticulosis    Fibromyalgia    GERD (gastroesophageal reflux disease)    past hx    Heart murmur     Hyperlipidemia    controlled - metabolic syndrome per pt- goes up and down    Hypertension    Lung infiltrate on CT 09/14/2015   Meningioma (HCC) 11/2009   right post parietal 16mm - stable 11/2009 MRI   Nephrolithiasis    Nephrolithiasis    Neuromuscular disorder (HCC)    fibromyalgia   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  No significant coronary obstructive disease in this patient with very short left main that immediately bifurcates into a large LAD and a large dominant left circumflex coronary artery.  The midportion of the LAD dips slightly intramyocardially and has mild irregularity of 10% in this intramyocardial segment.  There was no evidence gross systolic muscle bridging.  The dominant circumflex coronary artery is angiographically normal as is the small nondominant RCA.  Normal LV function with EF estimate at 60 to 65%.  LVEDP 13 mmHg.  RECOMMENDATION: Medical therapy.  Findings Coronary Findings Diagnostic  Dominance: Left  Left Anterior Descending Mid LAD lesion is 10% stenosed.  Intervention  No interventions have been documented.     ECHOCARDIOGRAM  ECHOCARDIOGRAM COMPLETE 06/08/2022  Narrative ECHOCARDIOGRAM REPORT    Patient Name:   [name] Date of Exam: 06/08/2022 Medical Rec #:  [id number]        Height:       69.0 in Accession #:    [id number]       Weight:       202.0 lb Date of Birth:  1941/04/10        BSA:          2.075 m Patient Age:    81 years         BP:           145/80 mmHg Patient Gender: F                HR:           87 bpm. Exam Location:  Outpatient  Procedure: 2D Echo, Cardiac Doppler and Color Doppler  Indications:     R01.1 Murmur  History:        Patient has prior history of Echocardiogram examinations. CAD, Signs/Symptoms:Murmur; Risk Factors:Hypertension, Dyslipidemia and Non-Smoker. Patient denies chest pain, SOB and leg edema. Patient has heart murmur.  Sonographer:    Carlos American RVT, RDCS (AE), RDMS Referring Phys: [phone] [facility]   Sonographer Comments: Suboptimal apical window. Image acquisition challenging due to patient body habitus and pendulous breasts. IMPRESSIONS   1. Left ventricular ejection fraction by 3D volume is 72 %. The left ventricle has hyperdynamic function. The left ventricle has no regional wall motion abnormalities. There is mild left ventricular hypertrophy. Left ventricular diastolic parameters are consistent with Grade II diastolic dysfunction (pseudonormalization). 2. Right ventricular systolic function is normal. The right ventricular size is normal. There is normal pulmonary artery systolic pressure. The estimated right ventricular systolic pressure is 28.4 mmHg. 3. Left atrial size was mildly dilated. 4. The mitral valve is grossly normal. Trivial mitral valve regurgitation. No evidence of mitral stenosis. 5. The aortic valve is abnormal. There is moderate calcification of the aortic valve. Aortic valve regurgitation is not visualized. Aortic valve sclerosis/calcification is present, without any evidence of aortic stenosis. 6. The inferior vena cava is normal in size with greater than 50% respiratory variability, suggesting right atrial pressure of 3 mmHg.  FINDINGS Left Ventricle: Left ventricular ejection fraction by 3D volume is 72 %. The left ventricle has hyperdynamic function. The left ventricle has no regional wall motion abnormalities. The left ventricular internal cavity size was normal in size. There is mild left ventricular hypertrophy. Left ventricular diastolic parameters are consistent with Grade II diastolic dysfunction  (pseudonormalization).  Right Ventricle: The right ventricular size is normal. No increase in right ventricular wall thickness. Right ventricular systolic function is normal. There is normal pulmonary artery systolic pressure. The tricuspid regurgitant velocity is 2.52 m/s, and with an assumed right atrial pressure of 3 mmHg, the estimated right ventricular systolic pressure is 28.4 mmHg.  Left Atrium: Left atrial size was mildly dilated.  Right Atrium: Right atrial size was normal in size.  Pericardium: There is no evidence of pericardial effusion.  Mitral Valve: The mitral valve is grossly normal. Mild mitral annular calcification. Trivial mitral valve regurgitation. No evidence of mitral valve stenosis. The mean mitral valve gradient is 2.6 mmHg with average heart rate of 85 bpm.  Tricuspid Valve: The tricuspid valve is normal in structure. Tricuspid valve regurgitation is trivial. No evidence of tricuspid stenosis.  Aortic Valve: The aortic valve is abnormal. There is moderate calcification of the aortic valve. Aortic  valve regurgitation is not visualized. Aortic valve sclerosis/calcification is present, without any evidence of aortic stenosis. Aortic valve mean gradient measures 6.0 mmHg. Aortic valve peak gradient measures 9.9 mmHg. Aortic valve area, by VTI measures 2.21 cm.  Pulmonic Valve: The pulmonic valve was not well visualized. Pulmonic valve regurgitation is trivial. No evidence of pulmonic stenosis.  Aorta: The aortic root is normal in size and structure and the ascending aorta was not well visualized.  Venous: The inferior vena cava is normal in size with greater than 50% respiratory variability, suggesting right atrial pressure of 3 mmHg.  IAS/Shunts: No atrial level shunt detected by color flow Doppler.   LEFT VENTRICLE PLAX 2D LVIDd:         3.69 cm         Diastology LVIDs:         2.54 cm         LV e' medial:    6.93 cm/s LV PW:         1.05 cm         LV E/e'  medial:  18.2 LV IVS:        1.20 cm         LV e' lateral:   8.25 cm/s LVOT diam:     1.90 cm         LV E/e' lateral: 15.3 LV SV:         85 LV SV Index:   41 LVOT Area:     2.84 cm        3D Volume EF LV 3D EF:    Left ventricul LV Volumes (MOD)                            ar LV vol d, MOD    54.4 ml                    ejection A4C:                                        fraction LV vol s, MOD    14.6 ml                    by 3D A4C:                                        volume is LV SV MOD A4C:   54.4 ml                    72 %.  3D Volume EF: 3D EF:        72 % LV EDV:       94 ml LV ESV:       27 ml LV SV:        67 ml  RIGHT VENTRICLE RV S prime:     16.20 cm/s TAPSE (M-mode): 2.3 cm  LEFT ATRIUM           Index        RIGHT ATRIUM           Index LA diam:      3.30 cm 1.59 cm/m   RA Area:     10.10 cm LA Vol (  A2C): 76.9 ml 37.06 ml/m  RA Volume:   20.10 ml  9.69 ml/m LA Vol (A4C): 71.2 ml 34.33 ml/m AORTIC VALVE                     PULMONIC VALVE AV Area (Vmax):    2.47 cm      PV Vmax:          1.06 m/s AV Area (Vmean):   2.12 cm      PV Peak grad:     4.5 mmHg AV Area (VTI):     2.21 cm      PR End Diast Vel: 3.60 msec AV Vmax:           157.00 cm/s AV Vmean:          123.000 cm/s AV VTI:            0.384 m AV Peak Grad:      9.9 mmHg AV Mean Grad:      6.0 mmHg LVOT Vmax:         137.00 cm/s LVOT Vmean:        91.800 cm/s LVOT VTI:          0.299 m LVOT/AV VTI ratio: 0.78  AORTA Ao Root diam: 3.40 cm  MITRAL VALVE                TRICUSPID VALVE MV Area (PHT): 2.72 cm     TR Peak grad:   25.4 mmHg MV Mean grad:  2.6 mmHg     TR Vmax:        252.00 cm/s MV Decel Time: 279 msec MV E velocity: 126.00 cm/s  SHUNTS MV A velocity: 121.00 cm/s  Systemic VTI:  0.30 m MV E/A ratio:  1.04         Systemic Diam: 1.90 cm  Weston Brass MD Electronically signed by Weston Brass MD Signature Date/Time: 06/09/2022/9:52:14 AM    Final     MONITORS  LONG TERM MONITOR (3-14 DAYS) 06/19/2022  Narrative 14 Day Zio Monitor  Quality: Fair.  Baseline artifact. Predominant rhythm: Sinus rhythm Average heart rate: 78 bpm Max heart rate: 124 bpm in sinus rhythm.  187 bpm in SVT. Min heart rate: 56 bpm Pauses >2.5 seconds: none  Rare PVCs and PACs. 30 episodes of SVT.  Fastest rate 187 bpm.  Longest episode 11.3 seconds.  Tiffany C. Duke Salvia, MD, [facility] 06/20/2022 9:46 AM            Risk Assessment/Calculations:     HYPERTENSION CONTROL Vitals:   03/01/23 0914  BP: (!) 162/66    The patient's blood pressure is elevated above target today.  In order to address the patient's elevated BP: Blood pressure will be monitored at home to determine if medication changes need to be made.           Physical Exam:   VS:  BP (!) 162/66   Pulse 83   Ht 5\' 9"  (1.753 m)   Wt 194 lb 12.8 oz (88.4 kg)   SpO2 95%   BMI 28.77 kg/m    Wt Readings from Last 3 Encounters:  03/01/23 194 lb 12.8 oz (88.4 kg)  02/28/23 194 lb (88 kg)  02/11/23 199 lb 15.3 oz (90.7 kg)     GEN: Well nourished, well developed in no acute distress NECK: No JVD; No carotid bruits CARDIAC: RRR, 2/6 systolic murmur RUSB/LUSB RESPIRATORY:  Clear to auscultation without rales, wheezing or rhonchi  ABDOMEN: Soft, non-tender, non-distended EXTREMITIES:  No edema; No deformity   ASSESSMENT AND PLAN:   #Palpitations: -Cardiac monitor with 30 episodes of SVT with longest lasting 11.3s which did not correlate with symptoms -Currently doing well on metop 25mg  BID prn  #HTN: -Goal BP <130/90 -Currently blood pressure mainly 130s when on medication; she missed her meds this morning -Continue valsartan 160mg  at night -Continue hydralazine 50mg  BID -Prefers to follow with PCP going forward for BP management  #Nonobstructive CAD: -Cath with mild irregularity with 10% LAD -Doing well without anginal symptoms -Would resume simvastatin 20mg  daily given  itching has not improved off the medication; she will discuss further with PCP  Prefers to follow with PCP going forward and can call us as needed in the future.        Signed, Meriam Sprague, MD

## 2023-02-28 ENCOUNTER — Ambulatory Visit (HOSPITAL_BASED_OUTPATIENT_CLINIC_OR_DEPARTMENT_OTHER): Payer: PPO | Admitting: Pulmonary Disease

## 2023-02-28 ENCOUNTER — Telehealth: Payer: Self-pay | Admitting: Pulmonary Disease

## 2023-02-28 ENCOUNTER — Encounter (HOSPITAL_BASED_OUTPATIENT_CLINIC_OR_DEPARTMENT_OTHER): Payer: Self-pay | Admitting: Pulmonary Disease

## 2023-02-28 VITALS — BP 134/60 | HR 87 | Temp 98.6°F | Ht 69.0 in | Wt 194.0 lb

## 2023-02-28 DIAGNOSIS — J189 Pneumonia, unspecified organism: Secondary | ICD-10-CM

## 2023-02-28 DIAGNOSIS — R599 Enlarged lymph nodes, unspecified: Secondary | ICD-10-CM | POA: Diagnosis not present

## 2023-02-28 NOTE — Telephone Encounter (Signed)
[place] patient wants it at [facility] when you schedule it

## 2023-02-28 NOTE — Telephone Encounter (Signed)
Changed CT Chest to with contrast and also changed location to [facility]. Of note, scan needs to be scheduled the week of August 19-23

## 2023-02-28 NOTE — Telephone Encounter (Signed)
Pt calling. She has a CT scan about July 11th and she wanted to request it be sched at [facility]. Her number is [phone]

## 2023-02-28 NOTE — Progress Notes (Addendum)
Subjective:   PATIENT ID: [name] GENDER: female DOB: 07/12/1941, MRN: [id number]   HPI  Chief Complaint  Patient presents with   Follow-up    Follow up.     Reason for Visit: Follow-up diffuse cystic lung disease  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  07/03/21 Since our last visit, she reports a light dry cough in the last several weeks. Denies shortness of breath or wheezing. Attributes this to change in weather and after switching from air conditioning to heating in her home. She does not feel like this limits her activity or quality of life. Her last PET in July demonstrated hypermetabolic nodules in the left parotid which led to biopsy which was fortunately negative for malignancy.  11/07/21 On her last visit she had a mild cough. She scheduled an appointment when her cough worsened but then resolved after discontinuing her ACEI three weeks ago. No shortness of breath or wheezing.  03/16/22 Since our last visit she reports overall feeling well. Minor non-productive cough. Denies shortness of breath or wheezing.  02/28/23 Since our last visit she was briefly hospitalized 6/24-6/25 for pneumonia. Discharged with no oxygen needs. She reports improving cough. Taking cough syrup sometimes. Denies shortness of breath or wheezing.   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  Past Medical History:  Diagnosis Date   Anxiety    Arthritis    right thumb [facility] arthritis   Asthmatic bronchitis 2017   Cancer (HCC)    -NHL R parotid   Cataract    bilateral small- removed both eyes    Colon cancer (HCC)    stage 1- at appendiceal orifice    Complication of anesthesia    has dry mouth anyway from shograns-sore throats post op   Coronary artery  calcification seen on CAT scan 11/14/2019   Depression    since 1982    Diverticulosis    Fibromyalgia    GERD (gastroesophageal reflux disease)    past hx    Heart murmur    Hyperlipidemia    controlled - metabolic syndrome per pt- goes up and down    Hypertension    Lung infiltrate on CT 09/14/2015   Meningioma (HCC) 11/2009   right post parietal 16mm - stable 11/2009 MRI   Nephrolithiasis    Nephrolithiasis    Neuromuscular disorder (HCC)    fibromyalgia   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     Artificial Tear Ointment (DRY EYES OP) Apply 1 drop to eye as needed (Dry eye).     Calcium Carbonate (CALCIUM 500 PO) Take 500 mg by mouth every other day.     Cholecalciferol (VITAMIN D3) 50 MCG (2000 UT) TABS Take 1 tablet by mouth daily.     Cyanocobalamin (B-12 PO) Take 1 tablet by mouth daily.     guaiFENesin-dextromethorphan (ROBITUSSIN DM) 100-10 MG/5ML syrup Take 5 mLs by mouth every 4 (four) hours as  needed for cough. 118 mL 0   hydrALAZINE (APRESOLINE) 50 MG tablet Take 1 tablet (50 mg total) by mouth in the morning and at bedtime. 12 HOURS APART 180 tablet 3   metoprolol tartrate (LOPRESSOR) 25 MG tablet TAKE 1 TABLET(25 MG) BY MOUTH TWICE DAILY AS NEEDED FOR PALPITATIONS (Patient taking differently: Take 12.5 mg by mouth 2 (two) times daily as needed (Palpitations).) 180 tablet 3   PREVIDENT 5000 ENAMEL PROTECT 1.1-5 % GEL 1 Application by Other route at bedtime.     valsartan (DIOVAN) 160 MG tablet Take 1 tablet (160 mg total) by mouth daily. 90 tablet 3   No facility-administered medications prior to visit.    Review of  Systems  Constitutional:  Negative for chills, diaphoresis, fever, malaise/fatigue and weight loss.  HENT:  Negative for congestion.   Respiratory:  Positive for cough. Negative for hemoptysis, sputum production, shortness of breath and wheezing.   Cardiovascular:  Negative for chest pain, palpitations and leg swelling.     Objective:   Vitals:   02/28/23 1341  BP: 134/60  Pulse: 87  Temp: 98.6 F (37 C)  TempSrc: Oral  SpO2: 95%  Weight: 194 lb (88 kg)  Height: 5\' 9"  (1.753 m)    SpO2: 95 % O2 Device: None (Room air)  Physical Exam: General: Well-appearing, no acute distress HENT: [place], AT Eyes: EOMI, no scleral icterus Respiratory: Clear to auscultation bilaterally.  No crackles, wheezing or rales Cardiovascular: RRR, -M/R/G, no JVD Extremities:-Edema,-tenderness Neuro: AAO x4, CNII-XII grossly intact Psych: Normal mood, normal affect  Data Reviewed:  Imaging: PET/CT 02/06/18 - Negative for hypermetabolic activity. Thin-walled cysts in mid and lower lungs bilaterally.  CT Chest 07/08/20 - Basilar predominance of thin-walled cysts of varying sizes. Some cysts with calcifications.  PET/CT 02/23/21 - Hypermetabolic nodules in left parotid. Similar low-level hypermetabolism in LLL concerning for inflammatory etiology. Stable cystic lung disease  CT Chest 03/05/22 - Stable thin walled cysts of varying sizes. Some cyts with calcifications.   CT Chest 02/11/23 - Bilateral lower lobe ground glass opacities. Unchanged thin walled cysts bilaterally. Borderline mediastinal lymph nodes enlargement  PFT: 11/01/20 FVC 2.23 (67%) FEV1 1.75 (71%) Ratio 78  TLC 109% DLCO 71%. FVC increased by 12% and FEV1 increased by 13% Interpretation: Reversible obstructive defect present on spirometry. Increased RV and RV/TLC consistent with air trapping. Mildly reduced gas exchange  Labs: 10/18/15 ANA 1:1280 SSA >8 SSB>8 CCP neg  Echo: 12/08/19 EF 70-75%. Age-related DD. No WMA or valvular  abnormalities.  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 Reviewed recent hospitalization and CT for pneumonia. Borderline lymphadenopathy likely reactive in setting of infection however will need additional surveillance with hx of lymphoma.   Multifocal pneumonia Borderline mediastinal adenopathy - likely reactive however hx lymphoma Post-infectious cough --ORDER CT Chest with contrast the week of August 19-23  Reversible obstructive lung disease Hx cough secondary to ACEI Overall asymptomatic --If you develop symptoms of persistent shortness of breath, wheezing or cough in the absence of illness, please call our office   Diffuse cystic lung disease likely secondary to Sjogrens - Symptomatic. Stable lung cyts on 2024 scan   Health Maintenance Immunization History  Administered Date(s) Administered   Influenza Split 05/15/2012, 04/28/2014, 05/21/2015  Influenza Whole 05/21/2007, 05/19/2008, 06/01/2010   Influenza, High Dose Seasonal PF 05/09/2021   Influenza,inj,Quad PF,6+ Mos 05/01/2016, 05/10/2017, 05/13/2018, 05/07/2019, 05/12/2020   Influenza,inj,quad, With Preservative 06/14/2015   PFIZER(Purple Top)SARS-COV-2 Vaccination 10/03/2019, 10/25/2019, 06/28/2020, 11/18/2020, 05/05/2021   Pfizer Covid-19 Vaccine Bivalent Booster 22yrs & up 05/05/2021   Pneumococcal Conjugate-13 03/01/2015   Pneumococcal Polysaccharide-23 08/20/1998, 02/22/1999   Td 04/29/2006   Zoster Recombinant(Shingrix) 06/14/2020   Zoster, Live 06/23/2007   Zoster, Unspecified 04/13/2000   Orders Placed This Encounter  Procedures   CT Chest Wo Contrast    Standing Status:   Future    Standing Expiration Date:   02/28/2024    Order Specific Question:    Preferred imaging location?    Answer:   [facility]   No orders of the defined types were placed in this encounter.  Return for Last week of August, after CT scan.  I have spent a total time of 30-minutes on the day of the appointment including chart review, data review, collecting history, coordinating care and discussing medical diagnosis and plan with the patient/family. Past medical history, allergies, medications were reviewed. Pertinent imaging, labs and tests included in this note have been reviewed and interpreted independently by me.  Latrelle Fuston Mechele Collin, MD [facility] 02/28/2023 2:11 PM  Office Number [phone]

## 2023-02-28 NOTE — Patient Instructions (Signed)
Multifocal pneumonia Borderline mediastinal adenopathy - likely reactive however hx lymphoma --ORDER CT Chest without contrast the week of August 19-23

## 2023-02-28 NOTE — Addendum Note (Signed)
Addended by: Luciano Cutter on: 02/28/2023 04:55 PM   Modules accepted: Orders

## 2023-03-01 ENCOUNTER — Encounter: Payer: Self-pay | Admitting: Cardiology

## 2023-03-01 ENCOUNTER — Ambulatory Visit: Payer: PPO | Attending: Cardiovascular Disease | Admitting: Cardiology

## 2023-03-01 VITALS — BP 162/66 | HR 83 | Ht 69.0 in | Wt 194.8 lb

## 2023-03-01 DIAGNOSIS — E785 Hyperlipidemia, unspecified: Secondary | ICD-10-CM

## 2023-03-01 DIAGNOSIS — I2510 Atherosclerotic heart disease of native coronary artery without angina pectoris: Secondary | ICD-10-CM

## 2023-03-01 DIAGNOSIS — I10 Essential (primary) hypertension: Secondary | ICD-10-CM | POA: Diagnosis not present

## 2023-03-01 DIAGNOSIS — R002 Palpitations: Secondary | ICD-10-CM | POA: Diagnosis not present

## 2023-03-01 DIAGNOSIS — I2584 Coronary atherosclerosis due to calcified coronary lesion: Secondary | ICD-10-CM

## 2023-03-01 NOTE — Patient Instructions (Signed)
Medication Instructions:   Your physician recommends that you continue on your current medications as directed. Please refer to the Current Medication list given to you today.  *If you need a refill on your cardiac medications before your next appointment, please call your pharmacy*    Follow-Up:  AS NEEDED FOLLOW-UP WITH OUR OFFICE

## 2023-03-04 ENCOUNTER — Other Ambulatory Visit (INDEPENDENT_AMBULATORY_CARE_PROVIDER_SITE_OTHER): Payer: PPO

## 2023-03-04 DIAGNOSIS — D649 Anemia, unspecified: Secondary | ICD-10-CM

## 2023-03-04 LAB — CBC WITH DIFFERENTIAL/PLATELET
Basophils Absolute: 0 10*3/uL (ref 0.0–0.1)
Basophils Relative: 0.8 % (ref 0.0–3.0)
Eosinophils Absolute: 0.2 10*3/uL (ref 0.0–0.7)
Eosinophils Relative: 3.6 % (ref 0.0–5.0)
HCT: 34.6 % — ABNORMAL LOW (ref 36.0–46.0)
Hemoglobin: 11.4 g/dL — ABNORMAL LOW (ref 12.0–15.0)
Lymphocytes Relative: 21.8 % (ref 12.0–46.0)
Lymphs Abs: 1 10*3/uL (ref 0.7–4.0)
MCHC: 33 g/dL (ref 30.0–36.0)
MCV: 88.4 fl (ref 78.0–100.0)
Monocytes Absolute: 0.5 10*3/uL (ref 0.1–1.0)
Monocytes Relative: 11.5 % (ref 3.0–12.0)
Neutro Abs: 3 10*3/uL (ref 1.4–7.7)
Neutrophils Relative %: 62.3 % (ref 43.0–77.0)
Platelets: 252 10*3/uL (ref 150.0–400.0)
RBC: 3.91 Mil/uL (ref 3.87–5.11)
RDW: 13.5 % (ref 11.5–15.5)
WBC: 4.8 10*3/uL (ref 4.0–10.5)

## 2023-03-04 LAB — VITAMIN B12: Vitamin B-12: 853 pg/mL (ref 211–911)

## 2023-03-05 LAB — IRON,TIBC AND FERRITIN PANEL
%SAT: 19 % (calc) (ref 16–45)
Ferritin: 52 ng/mL (ref 16–288)
Iron: 55 ug/dL (ref 45–160)
TIBC: 290 mcg/dL (calc) (ref 250–450)

## 2023-03-11 DIAGNOSIS — Z1231 Encounter for screening mammogram for malignant neoplasm of breast: Secondary | ICD-10-CM | POA: Diagnosis not present

## 2023-03-11 LAB — HM MAMMOGRAPHY

## 2023-04-01 ENCOUNTER — Telehealth: Payer: Self-pay | Admitting: Pulmonary Disease

## 2023-04-01 NOTE — Telephone Encounter (Signed)
See telephone encounter from 02/28/2023. Patient states she did not request to have CT scan at [facility]- Patient prefers [facility] to have the scan. Patient called [facility] and they told her to call our office. Please advise if patient can get CT scan at [facility] before her appt with Dr. Everardo All 8/28. Patient states if we cannot get it back at [facility], she would just go to [facility] on 8/19.

## 2023-04-03 NOTE — Telephone Encounter (Signed)
Patient checking on message for CT scan location. Patient phone number is [phone].

## 2023-04-04 NOTE — Telephone Encounter (Signed)
PT states she was called by [place] yesterday  to make appt for CT and soon after the hospital called and said she could not come in on that date.  She is concerned because she has a follow up appt sched w/Dr. Everardo All soon.   Please call to advise.

## 2023-04-05 ENCOUNTER — Ambulatory Visit: Admission: RE | Admit: 2023-04-05 | Payer: PPO | Source: Ambulatory Visit

## 2023-04-05 DIAGNOSIS — R599 Enlarged lymph nodes, unspecified: Secondary | ICD-10-CM | POA: Diagnosis not present

## 2023-04-05 MED ORDER — IOPAMIDOL (ISOVUE-300) INJECTION 61%
500.0000 mL | Freq: Once | INTRAVENOUS | Status: AC | PRN
  Administered 2023-04-05: 75 mL via INTRAVENOUS

## 2023-04-08 ENCOUNTER — Ambulatory Visit (HOSPITAL_COMMUNITY): Payer: PPO

## 2023-04-09 ENCOUNTER — Other Ambulatory Visit: Payer: PPO

## 2023-04-16 ENCOUNTER — Encounter: Payer: Self-pay | Admitting: Hematology and Oncology

## 2023-04-16 ENCOUNTER — Inpatient Hospital Stay: Payer: PPO | Admitting: Hematology and Oncology

## 2023-04-16 ENCOUNTER — Inpatient Hospital Stay: Payer: PPO | Attending: Hematology and Oncology

## 2023-04-16 VITALS — BP 150/50 | HR 79 | Resp 18 | Ht 69.0 in | Wt 196.0 lb

## 2023-04-16 DIAGNOSIS — Z85038 Personal history of other malignant neoplasm of large intestine: Secondary | ICD-10-CM | POA: Diagnosis not present

## 2023-04-16 DIAGNOSIS — Z8572 Personal history of non-Hodgkin lymphomas: Secondary | ICD-10-CM | POA: Diagnosis not present

## 2023-04-16 DIAGNOSIS — D539 Nutritional anemia, unspecified: Secondary | ICD-10-CM

## 2023-04-16 DIAGNOSIS — C8588 Other specified types of non-Hodgkin lymphoma, lymph nodes of multiple sites: Secondary | ICD-10-CM

## 2023-04-16 LAB — CBC WITH DIFFERENTIAL (CANCER CENTER ONLY)
Abs Immature Granulocytes: 0.02 10*3/uL (ref 0.00–0.07)
Basophils Absolute: 0 10*3/uL (ref 0.0–0.1)
Basophils Relative: 1 %
Eosinophils Absolute: 0.1 10*3/uL (ref 0.0–0.5)
Eosinophils Relative: 2 %
HCT: 34.9 % — ABNORMAL LOW (ref 36.0–46.0)
Hemoglobin: 11.2 g/dL — ABNORMAL LOW (ref 12.0–15.0)
Immature Granulocytes: 0 %
Lymphocytes Relative: 16 %
Lymphs Abs: 1 10*3/uL (ref 0.7–4.0)
MCH: 29.6 pg (ref 26.0–34.0)
MCHC: 32.1 g/dL (ref 30.0–36.0)
MCV: 92.1 fL (ref 80.0–100.0)
Monocytes Absolute: 0.6 10*3/uL (ref 0.1–1.0)
Monocytes Relative: 10 %
Neutro Abs: 4.2 10*3/uL (ref 1.7–7.7)
Neutrophils Relative %: 71 %
Platelet Count: 229 10*3/uL (ref 150–400)
RBC: 3.79 MIL/uL — ABNORMAL LOW (ref 3.87–5.11)
RDW: 13.1 % (ref 11.5–15.5)
WBC Count: 5.9 10*3/uL (ref 4.0–10.5)
nRBC: 0 % (ref 0.0–0.2)

## 2023-04-16 LAB — CMP (CANCER CENTER ONLY)
ALT: 11 U/L (ref 0–44)
AST: 14 U/L — ABNORMAL LOW (ref 15–41)
Albumin: 4.1 g/dL (ref 3.5–5.0)
Alkaline Phosphatase: 77 U/L (ref 38–126)
Anion gap: 7 (ref 5–15)
BUN: 17 mg/dL (ref 8–23)
CO2: 28 mmol/L (ref 22–32)
Calcium: 9.5 mg/dL (ref 8.9–10.3)
Chloride: 103 mmol/L (ref 98–111)
Creatinine: 0.68 mg/dL (ref 0.44–1.00)
GFR, Estimated: >60 mL/min (ref >60)
Glucose, Bld: 112 mg/dL — ABNORMAL HIGH (ref 70–99)
Potassium: 4.2 mmol/L (ref 3.5–5.1)
Sodium: 138 mmol/L (ref 135–145)
Total Bilirubin: 0.3 mg/dL (ref 0.3–1.2)
Total Protein: 7.2 g/dL (ref 6.5–8.1)

## 2023-04-16 LAB — FERRITIN: Ferritin: 42 ng/mL (ref 11–307)

## 2023-04-16 LAB — VITAMIN B12: Vitamin B-12: 764 pg/mL (ref 180–914)

## 2023-04-16 NOTE — Assessment & Plan Note (Signed)
Careful examination of the head and neck region did not reveal any abnormal lymphadenopathy The patient is not symptomatic I plan to see her once a year

## 2023-04-16 NOTE — Progress Notes (Signed)
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 [facility]) Careful examination of the head and neck region did not reveal any abnormal lymphadenopathy The patient is not symptomatic I plan to see her once a year  History of colon cancer, stage I She had colonoscopy in 2021 that came back within normal range The patient is upset that her gastroenterologist declined an earlier colonoscopy I recommend she reach out to her gastroenterologist to express her concern  Orders Placed This Encounter  Procedures   CBC with Differential (Cancer Center Only)    Standing Status:   Future    Standing Expiration Date:   04/15/2024   CMP (Cancer Center only)    Standing Status:   Future    Standing Expiration Date:   04/15/2024   Lactate dehydrogenase    Standing Status:   Future    Standing Expiration Date:   04/15/2024    All questions were answered. The patient knows to call the clinic with any problems, questions or concerns. The total time spent in the appointment was 25 minutes encounter with patients including review of chart and various tests results, discussions about plan of care and coordination of care plan   Artis Delay, MD 04/16/2023 12:25 PM  INTERVAL HISTORY: Please see below for problem oriented charting. she returns for surveillance follow-up for history of marginal zone lymphoma She is recovering from recent diagnosis of pneumonia She expressed concern regarding timing of her colonoscopy  REVIEW OF SYSTEMS:   Constitutional: Denies fevers, chills or abnormal weight loss Eyes: Denies  blurriness of vision Ears, nose, mouth, throat, and face: Denies mucositis or sore throat Respiratory: Denies cough, dyspnea or wheezes Cardiovascular: Denies palpitation, chest discomfort or lower extremity swelling Gastrointestinal:  Denies nausea, heartburn or change in bowel habits Skin: Denies abnormal skin rashes Lymphatics: Denies new lymphadenopathy or easy bruising Neurological:Denies numbness, tingling or new weaknesses Behavioral/Psych: Mood is stable, no new changes  All other systems were reviewed with the patient and are negative.  I have reviewed the past medical history, past surgical history, social history and family history with the patient and they are unchanged from previous note.  ALLERGIES:  is allergic to lisinopril and simvastatin.  MEDICATIONS:  Current Outpatient Medications  Medication Sig Dispense Refill   acetaminophen (TYLENOL) 500 MG tablet Take 500 mg by mouth every 6 (six) hours as needed for moderate pain.     Artificial Tear Ointment (DRY EYES OP) Apply 1 drop to eye as needed (Dry eye).     Calcium Carbonate (CALCIUM 500 PO) Take 500 mg by mouth every other day.     Cholecalciferol (VITAMIN D3) 50 MCG (2000 UT) TABS Take 1 tablet by mouth daily.     Cyanocobalamin (B-12 PO) Take 1 tablet by mouth daily.     guaiFENesin-dextromethorphan (ROBITUSSIN DM) 100-10 MG/5ML syrup Take 5 mLs by mouth every 4 (four) hours as needed for cough. 118 mL 0   hydrALAZINE (APRESOLINE) 50 MG tablet Take 1 tablet (50 mg total) by mouth in the morning and at bedtime. 12 HOURS APART 180 tablet 3   metoprolol tartrate (LOPRESSOR) 25 MG tablet TAKE 1 TABLET(25 MG)  BY MOUTH TWICE DAILY AS NEEDED FOR PALPITATIONS (Patient taking differently: Take 12.5 mg by mouth 2 (two) times daily as needed (Palpitations).) 180 tablet 3   PREVIDENT 5000 ENAMEL PROTECT 1.1-5 % GEL 1 Application by Other route at bedtime.     valsartan (DIOVAN) 160 MG tablet Take 1 tablet (160 mg total) by mouth  daily. 90 tablet 3   No current facility-administered medications for this visit.    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 Please refer to report for other findings   11/10/2015 Procedure   she underwent CT guided biopsy   11/10/2015 Pathology Results   Accession: [id number] right lung CT-guided biopsy suspicious for lymphoma   12/02/2015 - 12/23/2015 Chemotherapy   She received 4 doses of weekly rituximab.   02/10/2016 PET scan   There is no evidence for residual or recurrent hypermetabolic adenopathy   03/09/2016 Imaging   The masslike area of concern in the medial right lung base seen on the previous study is unchanged in size since March 2017. A biopsy was previously recommended after the CT scan from October 26, 2015. Please see that report for further recommendations as the finding was better characterized at that time. 2. No pulmonary emboli. 3. Multiple thin walled cysts in the mid and lower lungs is stable. 4. 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   02/06/2017 PET scan   No evidence of active lymphomatous involvement. Stable 1.5 cm mixed cystic/ solid nodule in the right lower lobe, non FDG avid. Continued attention on follow-up is suggested.   02/13/2017 Procedure   Colonoscopy - One 8 mm polyp in the cecum, removed with a cold snare. Resected and retrieved. - Two 3 to 4 mm polyps in the transverse colon, removed with a cold biopsy forceps. Resected and retrieved. - One 5 mm polyp in the descending colon, removed with a cold snare. Resected and retrieved. - Two 3 to 5 mm polyps at the recto-sigmoid colon, removed with a cold snare. Resected  and retrieved. - Mild diverticulosis in the sigmoid colon. - Internal hemorrhoids.      02/06/2018 PET scan   Negative PET-CT. No findings for enlarged or hypermetabolic lymph nodes to suggest lymphoma.   02/24/2021 PET scan   1. Hypermetabolic nodules are nodes centered about the left parotid gland, most likely indicative of recurrent lymphoma-given clinical history. 2. No evidence of hypermetabolic adenopathy outside of the neck.  3. Similar low-level hypermetabolism corresponding to an area of vague left lower lobe increased density. 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 4. Incidental findings, including: Coronary artery atherosclerosis. Aortic Atherosclerosis (ICD10-I70.0). Tiny hiatal hernia.     04/04/2021 Surgery   PREOPERATIVE DIAGNOSES: 1. Left parotid mass   POSTOPERATIVE DIAGNOSES: 1. Left parotid mass   PROCEDURE PERFORMED: Left lateral parotidectomy with facial nerve dissection     04/04/2021 Pathology Results   A. PAROTID GLAND, LEFT, PAROTIDECTOMY:  -  Lymphoepithelial sialadenitis  -  See comment   COMMENT:   The excision specimen consists of tissue submitted as left parotid.  It is composed of nodular lymphoepithelial lesions and interspersed fibrosis.  Cytokeratin AE1/3 and p40 highlight the  epithelial component within the nodular lymphoid proliferations.  The lymphocytes are composed of an admixture of B and T cells, by CD20 and CD3, respectively.  The B cells do not aberrantly express CD5, CD23, CD43 or cyclin D1.  Scattered germinal centers with polarization are highlighted by CD10, BCL6, Bcl-2 (negative) and Ki-67.  Plasma cells are polytypic (CD138, kappa and lambda by in situ hybridization).  Flow cytometry performed on the sample (see [id number]), did not identify a monoclonal B or phenotypically aberrant T-cell population.  Overall, there is no definitive evidence of a lymphoproliferative process in the examined material.       PHYSICAL EXAMINATION: ECOG PERFORMANCE STATUS: 0 - Asymptomatic  Vitals:   04/16/23 1104  BP: (!) 150/50  Pulse: 79  Resp: 18  SpO2: 97%   Filed Weights   04/16/23 1104  Weight: 196 lb (88.9 kg)    GENERAL:alert, no distress and comfortable SKIN: skin color, texture, turgor are normal, no rashes or significant lesions EYES: normal, Conjunctiva are pink and non-injected, sclera clear OROPHARYNX:no exudate, no erythema and lips, buccal mucosa, and tongue normal  NECK: supple, thyroid normal size, non-tender, without nodularity LYMPH:  no palpable lymphadenopathy in the cervical, axillary or inguinal LUNGS: clear to auscultation and percussion with normal breathing effort HEART: regular rate & rhythm with soft ESM and no lower extremity edema ABDOMEN:abdomen soft, non-tender and normal bowel sounds Musculoskeletal:no cyanosis of digits and no clubbing  NEURO: alert & oriented x 3 with fluent speech, no focal motor/sensory deficits  LABORATORY DATA:  I have reviewed the data as listed    Component Value Date/Time   NA 138 04/16/2023 1043   NA 139 08/10/2022 1046   NA 143 02/06/2017 1302   K 4.2 04/16/2023 1043   K 4.8 02/06/2017 1302   CL 103 04/16/2023 1043   CO2 28 04/16/2023 1043   CO2 26 02/06/2017 1302   GLUCOSE 112 (H) 04/16/2023 1043   GLUCOSE 100 02/06/2017 1302   BUN 17 04/16/2023 1043   BUN 11 08/10/2022 1046   BUN 14.2 02/06/2017 1302   CREATININE 0.68 04/16/2023 1043   CREATININE 0.65 09/28/2022 1650   CREATININE 0.8 02/06/2017 1302   CALCIUM 9.5 04/16/2023 1043   CALCIUM 9.7 02/06/2017 1302   PROT 7.2 04/16/2023 1043   PROT 6.9 05/29/2022 0905   PROT 7.8 02/06/2017 1302   ALBUMIN 4.1 04/16/2023 1043   ALBUMIN 4.4 05/29/2022 0905   ALBUMIN 3.9 02/06/2017 1302   AST 14 (L) 04/16/2023 1043   AST 20 02/06/2017 1302   ALT 11 04/16/2023 1043   ALT 25 02/06/2017 1302   ALKPHOS 77 04/16/2023 1043   ALKPHOS 105 02/06/2017 1302   BILITOT 0.3  04/16/2023 1043   BILITOT 0.41 02/06/2017 1302   GFRNONAA >60 04/16/2023 1043   GFRAA >60 02/09/2020 0818    No results found for: "SPEP", "UPEP"  Lab Results  Component Value Date   WBC 5.9 04/16/2023   NEUTROABS 4.2 04/16/2023   HGB 11.2 (L) 04/16/2023   HCT 34.9 (L) 04/16/2023   MCV 92.1 04/16/2023   PLT 229 04/16/2023      Chemistry      Component Value Date/Time   NA 138 04/16/2023 1043   NA 139 08/10/2022 1046   NA 143 02/06/2017 1302   K 4.2 04/16/2023 1043   K 4.8 02/06/2017 1302   CL 103 04/16/2023 1043   CO2 28 04/16/2023 1043   CO2 26 02/06/2017 1302   BUN 17 04/16/2023 1043  BUN 11 08/10/2022 1046   BUN 14.2 02/06/2017 1302   CREATININE 0.68 04/16/2023 1043   CREATININE 0.65 09/28/2022 1650   CREATININE 0.8 02/06/2017 1302      Component Value Date/Time   CALCIUM 9.5 04/16/2023 1043   CALCIUM 9.7 02/06/2017 1302   ALKPHOS 77 04/16/2023 1043   ALKPHOS 105 02/06/2017 1302   AST 14 (L) 04/16/2023 1043   AST 20 02/06/2017 1302   ALT 11 04/16/2023 1043   ALT 25 02/06/2017 1302   BILITOT 0.3 04/16/2023 1043   BILITOT 0.41 02/06/2017 1302       RADIOGRAPHIC STUDIES: I have personally reviewed the radiological images as listed and agreed with the findings in the report. CT Chest W Contrast  Result Date: 04/14/2023 CLINICAL DATA:  Lymphadenopathy, chest or axilla Multifocal pneumonia, Borderline mediastinal adenopathy - likely reactive however hx lymphoma Patient with borderline enlarged mediastinal lymph nodes on prior chest CT. History of lymphoma and colon cancer. EXAM: CT CHEST WITH CONTRAST TECHNIQUE: Multidetector CT imaging of the chest was performed during intravenous contrast administration. RADIATION DOSE REDUCTION: This exam was performed according to the departmental dose-optimization program which includes automated exposure control, adjustment of the mA and/or kV according to patient size and/or use of iterative reconstruction technique.  CONTRAST:  75mL ISOVUE-300 IOPAMIDOL (ISOVUE-300) INJECTION 61% COMPARISON:  Most recent chest CT 02/11/2023, 03/05/2022 FINDINGS: Cardiovascular: Aortic atherosclerosis. No aneurysm. Normal heart size. There are coronary artery calcifications. No pericardial effusion. Mediastinum/Nodes: Diminished paraesophageal nodes from prior exam. These nodes measure 7 and 8 mm short axis series 2, image 91, previously 11 and 10 mm. No new or enlarging mediastinal lymph nodes. No hilar adenopathy. 7 mm left subpectoral node, series 2, image 30, previously 9 mm. No enlarged axillary nodes. There is a small hiatal hernia. No thyroid nodule. Lungs/Pleura: Improvement and near complete resolution of the patchy areas of ground-glass attenuation and consolidation in the lower lobes from prior exam. Minimal residual subpleural opacity dependently typical scarring. Additional areas of nodularity in the left lower lobe for example series 8, image 92 are stable from 2023. No new or progressive airspace disease. There are numerous thin walled cysts throughout both lungs, mid lower lung zone predominant. This is a chronic finding and unchanged. No pleural fluid. Trachea and central airways are clear. Upper Abdomen: Low-attenuation lesion in the upper pole of the right kidney measuring 2.1 x 1.9 cm, series 2, image 135, not significantly changed from prior exam. The area of septal thickening on prior is not included in the field of view. No acute upper abdominal findings. Stable small non pathologically enlarged upper abdominal lymph nodes. Musculoskeletal: There are no acute or suspicious osseous abnormalities. IMPRESSION: 1. The previous borderline mediastinal lymph nodes have diminished in size, currently subcentimeter and normal. These were likely reactive. 2. Improvement and near complete resolution of the patchy areas of ground-glass attenuation and consolidation in the lower lobes from prior exam. Minimal residual subpleural opacity  dependently typical scarring. 3. Low-attenuation lesion in the upper pole of the right kidney measuring 2.1 x 1.9 cm, not significantly changed from prior exam. The area of septal thickening on prior is not included in the field of view. Based on this exam, no specific imaging follow-up is needed. 4.  Aortic Atherosclerosis (ICD10-I70.0). Electronically Signed   By: Narda Rutherford M.D.   On: 04/14/2023 23:06

## 2023-04-16 NOTE — Assessment & Plan Note (Addendum)
She had colonoscopy in 2021 that came back within normal range The patient is upset that her gastroenterologist declined an earlier colonoscopy I recommend she reach out to her gastroenterologist to express her concern

## 2023-04-17 ENCOUNTER — Ambulatory Visit (HOSPITAL_BASED_OUTPATIENT_CLINIC_OR_DEPARTMENT_OTHER): Payer: PPO | Admitting: Pulmonary Disease

## 2023-04-17 ENCOUNTER — Encounter (HOSPITAL_BASED_OUTPATIENT_CLINIC_OR_DEPARTMENT_OTHER): Payer: Self-pay | Admitting: Pulmonary Disease

## 2023-04-17 VITALS — BP 130/70 | HR 85 | Resp 16 | Ht 69.0 in | Wt 194.8 lb

## 2023-04-17 DIAGNOSIS — J984 Other disorders of lung: Secondary | ICD-10-CM | POA: Diagnosis not present

## 2023-04-17 NOTE — Patient Instructions (Addendum)
Multifocal pneumonia - resolved Borderline mediastinal adenopathy - resolved. Likely secondary to prior pneumonia Post-infectious cough - resolved --Reviewed CT chest with near resolution of infiltrates and adenopathy with minimal scarring at lung bases  Reversible obstructive lung disease Hx cough secondary to ACEI Overall asymptomatic --If you develop symptoms of persistent shortness of breath, wheezing or cough in the absence of illness, please call our office  --Encouraged influenza, RSV and covid vaccine. 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 Stable since 2019-2024 --No further imaging indicated however if patient wishes, surveillance CT every 2 years would be appropriate given stability.  Low back pain --Offered physical therapy. Patient declined due to financial constraints related to recent hospitalizations

## 2023-04-17 NOTE — Progress Notes (Signed)
Subjective:   PATIENT ID: [name] GENDER: female DOB: 1940-11-02, MRN: [id number]   HPI  Chief Complaint  Patient presents with   Follow-up    Here to go over CT scan    Reason for Visit: Follow-up diffuse cystic lung disease  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  07/03/21 Since our last visit, she reports a light dry cough in the last several weeks. Denies shortness of breath or wheezing. Attributes this to change in weather and after switching from air conditioning to heating in her home. She does not feel like this limits her activity or quality of life. Her last PET in July demonstrated hypermetabolic nodules in the left parotid which led to biopsy which was fortunately negative for malignancy.  11/07/21 On her last visit she had a mild cough. She scheduled an appointment when her cough worsened but then resolved after discontinuing her ACEI three weeks ago. No shortness of breath or wheezing.  03/16/22 Since our last visit she reports overall feeling well. Minor non-productive cough. Denies shortness of breath or wheezing.  02/28/23 Since our last visit she was briefly hospitalized 6/24-6/25 for pneumonia. Discharged with no oxygen needs. She reports improving cough. Taking cough syrup sometimes. Denies shortness of breath or wheezing.   04/17/23 Since our last visit she is having low back pain which she is attributed due to posture and unsure if this is related to her lungs. Denies shortness of breath, cough or wheezing. No longer taking cough syrup. Has returned to making art and reading as usual.  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  Past Medical History:  Diagnosis Date   Anxiety    Arthritis    right thumb CMC arthritis    Asthmatic bronchitis 2017   Cancer (HCC)    -NHL R parotid   Cataract    bilateral small- removed both eyes    Colon cancer (HCC)    stage 1- at appendiceal orifice    Complication of anesthesia    has dry mouth anyway from shograns-sore throats post op   Coronary artery calcification seen on CAT scan 11/14/2019   Depression    since 1982    Diverticulosis    Fibromyalgia    GERD (gastroesophageal reflux disease)    past hx    Heart murmur    Hyperlipidemia    controlled - metabolic syndrome per pt- goes up and down    Hypertension    Lung infiltrate on CT 09/14/2015   Meningioma (HCC) 11/2009   right post parietal 16mm - stable 11/2009 MRI   Nephrolithiasis    Nephrolithiasis    Neuromuscular disorder (HCC)    fibromyalgia   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     Artificial Tear Ointment (DRY EYES OP) Apply 1 drop to eye as needed (Dry eye).     Calcium Carbonate (CALCIUM 500 PO) Take 500 mg by mouth every other day.  Cholecalciferol (VITAMIN D3) 50 MCG (2000 UT) TABS Take 1 tablet by mouth daily.     Cyanocobalamin (B-12 PO) Take 1 tablet by mouth daily.     guaiFENesin-dextromethorphan (ROBITUSSIN DM) 100-10 MG/5ML syrup Take 5 mLs by mouth every 4 (four) hours as needed for cough. 118 mL 0   hydrALAZINE (APRESOLINE) 50 MG tablet Take 1 tablet (50 mg total) by mouth in the morning and at bedtime. 12 HOURS APART 180 tablet 3   metoprolol tartrate (LOPRESSOR) 25 MG tablet TAKE 1 TABLET(25 MG) BY MOUTH TWICE DAILY AS NEEDED FOR PALPITATIONS (Patient taking differently: Take 12.5 mg by mouth 2 (two) times daily as needed (Palpitations).) 180 tablet 3    PREVIDENT 5000 ENAMEL PROTECT 1.1-5 % GEL 1 Application by Other route at bedtime.     valsartan (DIOVAN) 160 MG tablet Take 1 tablet (160 mg total) by mouth daily. 90 tablet 3   No facility-administered medications prior to visit.    Review of Systems  Constitutional:  Negative for chills, diaphoresis, fever, malaise/fatigue and weight loss.  HENT:  Negative for congestion.   Respiratory:  Negative for cough, hemoptysis, sputum production, shortness of breath and wheezing.   Cardiovascular:  Negative for chest pain, palpitations and leg swelling.     Objective:   Vitals:   04/17/23 0935  BP: 130/70  Pulse: 85  Resp: 16  SpO2: 94%  Weight: 194 lb 12.8 oz (88.4 kg)  Height: 5\' 9"  (1.753 m)   SpO2: 94 %  Physical Exam: General: Well-appearing, no acute distress HENT: [place], AT Eyes: EOMI, no scleral icterus Respiratory: Clear to auscultation bilaterally.  No crackles, wheezing or rales Cardiovascular: RRR, -M/R/G, no JVD Extremities:-Edema,-tenderness Neuro: AAO x4, CNII-XII grossly intact Psych: Anxious mood, normal affect, pressured speech   Data Reviewed:  Imaging: PET/CT 02/06/18 - Negative for hypermetabolic activity. Thin-walled cysts in mid and lower lungs bilaterally.  CT Chest 07/08/20 - Basilar predominance of thin-walled cysts of varying sizes. Some cysts with calcifications.  PET/CT 02/23/21 - Hypermetabolic nodules in left parotid. Similar low-level hypermetabolism in LLL concerning for inflammatory etiology. Stable cystic lung disease  CT Chest 03/05/22 - Stable thin walled cysts of varying sizes. Some cyts with calcifications.   CT Chest 02/11/23 - Bilateral lower lobe ground glass opacities. Unchanged thin walled cysts bilaterally. Borderline mediastinal lymph nodes enlargement  CT Chest 04/05/23 - Borderline mediastinal lymph nodes normalized. Near resolution of bilateral lower lobe opacities and minimal scarring. Unchanged thin walled cysts  bilaterally  PFT: 11/01/20 FVC 2.23 (67%) FEV1 1.75 (71%) Ratio 78  TLC 109% DLCO 71%. FVC increased by 12% and FEV1 increased by 13% Interpretation: Reversible obstructive defect present on spirometry. Increased RV and RV/TLC consistent with air trapping. Mildly reduced gas exchange  Labs: 10/18/15 ANA 1:1280 SSA >8 SSB>8 CCP neg  Echo: 12/08/19 EF 70-75%. Age-related DD. No WMA or valvular abnormalities.  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  Asymptomatic. Discussed clinical course and management of chronic lung disease including bronchodilator regimen, preventive care including vaccinations and action plan for exacerbation.  Multifocal pneumonia - resolved Borderline mediastinal adenopathy - resolved. Likely secondary to prior pneumonia Post-infectious cough --Reviewed CT chest with near resolution of infiltrates and adenopathy with minimal scarring at lung bases  Reversible  obstructive lung disease Hx cough secondary to ACEI Overall asymptomatic --If you develop symptoms of persistent shortness of breath, wheezing or cough in the absence of illness, please call our office  --Encouraged influenza, RSV and covid vaccine. 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 Stable since 2019-2024 --No further imaging indicated however if patient wishes, surveillance CT every 2 years would be appropriate given stability.  Low back pain --Offered physical therapy. Patient declined due to financial constraints related to recent hospitalizations  Health Maintenance Immunization History  Administered Date(s) Administered   Influenza Split 05/15/2012, 04/28/2014, 05/21/2015    Influenza Whole 05/21/2007, 05/19/2008, 06/01/2010   Influenza, High Dose Seasonal PF 05/09/2021   Influenza,inj,Quad PF,6+ Mos 05/01/2016, 05/10/2017, 05/13/2018, 05/07/2019, 05/12/2020   Influenza,inj,quad, With Preservative 06/14/2015   PFIZER(Purple Top)SARS-COV-2 Vaccination 10/03/2019, 10/25/2019, 06/28/2020, 11/18/2020, 05/05/2021   Pfizer Covid-19 Vaccine Bivalent Booster 12yrs & up 05/05/2021   Pneumococcal Conjugate-13 03/01/2015   Pneumococcal Polysaccharide-23 08/20/1998, 02/22/1999   Td 04/29/2006   Zoster Recombinant(Shingrix) 06/14/2020   Zoster, Live 06/23/2007   Zoster, Unspecified 04/13/2000   No orders of the defined types were placed in this encounter.  No orders of the defined types were placed in this encounter.  Return in about 1 year (around 04/16/2024).  I have spent a total time of 30-minutes on the day of the appointment including chart review, data review, collecting history, coordinating care and discussing medical diagnosis and plan with the patient/family. Past medical history, allergies, medications were reviewed. Pertinent imaging, labs and tests included in this note have been reviewed and interpreted independently by me.  Kyana Aicher Mechele Collin, MD [facility] 04/17/2023 12:15 PM  Office Number [phone]

## 2023-04-22 NOTE — Addendum Note (Signed)
Addended by: Luciano Cutter on: 04/22/2023 01:58 AM   Modules accepted: Level of Service

## 2023-05-01 ENCOUNTER — Encounter: Payer: Self-pay | Admitting: Family Medicine

## 2023-05-01 ENCOUNTER — Ambulatory Visit (INDEPENDENT_AMBULATORY_CARE_PROVIDER_SITE_OTHER): Payer: PPO | Admitting: Family Medicine

## 2023-05-01 VITALS — BP 124/62 | HR 83 | Temp 97.6°F | Resp 18 | Ht 69.0 in | Wt 198.2 lb

## 2023-05-01 DIAGNOSIS — L299 Pruritus, unspecified: Secondary | ICD-10-CM | POA: Diagnosis not present

## 2023-05-01 DIAGNOSIS — I2584 Coronary atherosclerosis due to calcified coronary lesion: Secondary | ICD-10-CM | POA: Diagnosis not present

## 2023-05-01 DIAGNOSIS — I2510 Atherosclerotic heart disease of native coronary artery without angina pectoris: Secondary | ICD-10-CM | POA: Diagnosis not present

## 2023-05-01 DIAGNOSIS — L659 Nonscarring hair loss, unspecified: Secondary | ICD-10-CM

## 2023-05-01 DIAGNOSIS — E782 Mixed hyperlipidemia: Secondary | ICD-10-CM

## 2023-05-01 DIAGNOSIS — I10 Essential (primary) hypertension: Secondary | ICD-10-CM

## 2023-05-01 DIAGNOSIS — R0789 Other chest pain: Secondary | ICD-10-CM | POA: Diagnosis not present

## 2023-05-01 DIAGNOSIS — M3500 Sicca syndrome, unspecified: Secondary | ICD-10-CM | POA: Diagnosis not present

## 2023-05-01 DIAGNOSIS — R7303 Prediabetes: Secondary | ICD-10-CM

## 2023-05-01 LAB — LIPID PANEL
Cholesterol: 237 mg/dL — ABNORMAL HIGH (ref 0–200)
HDL: 52 mg/dL (ref >39.00)
LDL Cholesterol: 135 mg/dL — ABNORMAL HIGH (ref 0–99)
NonHDL: 185.03
Total CHOL/HDL Ratio: 5
Triglycerides: 251 mg/dL — ABNORMAL HIGH (ref 0.0–149.0)
VLDL: 50.2 mg/dL — ABNORMAL HIGH (ref 0.0–40.0)

## 2023-05-01 LAB — HEMOGLOBIN A1C: Hgb A1c MFr Bld: 5.9 % (ref 4.6–6.5)

## 2023-05-01 LAB — TSH: TSH: 1.15 u[IU]/mL (ref 0.35–5.50)

## 2023-05-01 NOTE — Assessment & Plan Note (Signed)
Chronic.  Controlled.  Continue hydralazine 50 mg 3 times daily.  Continue Lopressor 12.5 mg twice daily.  Continue valsartan 160 mg daily.

## 2023-05-01 NOTE — Patient Instructions (Signed)

## 2023-05-01 NOTE — Assessment & Plan Note (Signed)
She has had workup in the past.  Has seen cardiology.  She now has a new type of chest pain.  She would like to monitor it for now, if worse ER.  Advised to follow-up with cardiology as well if it continues

## 2023-05-01 NOTE — Assessment & Plan Note (Signed)
Chronic.  Has been advised to take a statin.  Currently, patient is not taking it as she thought it was causing itching.  However, the itching continues off of the statin.  She is willing to retry, but would like her lipids checked first

## 2023-05-01 NOTE — Assessment & Plan Note (Signed)
Chronic.  Patient is not on any medications.  Will check lipids.  Probably needs to be back on simvastatin due to coronary calcifications

## 2023-05-01 NOTE — Progress Notes (Signed)
Subjective:     Patient ID: [name], female    DOB: 1941-03-31, 82 y.o.   MRN: [id number]  Chief Complaint  Patient presents with   Follow-up    3 month follow-up on htn and pain Still having itching   HPI HTN - Pt is on lopressor 12.5 mg BID, hydralazine 50mg  bid, valsartan 160mg  daily, Bp's running not checked  . No ha/dizziness/cp/palp/edema/cough/sob. Bp at initial check was 150/80. Bp at recheck was 124/62.  Itching - Reports intense itchy that has traveled across her entire body from her scalp to her feet.  She thought it was due to the simvastatin, however it continues off the simvastatin.  There is no rash.  She is wondering if it is her autoimmune disorder as labs have been negative as well  Chest Pain - States that Sunday night she began feeling sharp pain on the left side of her chest. The pain continued into the night until 3 a.m. she considered going to the emergency room, however it subsided so she did not go. . By Monday evening, it started again then went away. Yesterday she briefly experienced the pain. As of today she had the sharp pain this morning and it has not returned since.  No other symptoms with it.  She did see the cardiologist in July.  Cardiac catheterization in the past did show some mild irregularity with 10% LAD.  Was recommended to continue on statin  Stress/ Anxiety - Complains that dealing with her sister has been difficult and reports increased stress levels.   Health Maintenance Due  Topic Date Due   Colonoscopy  03/05/2023    Past Medical History:  Diagnosis Date   Anxiety    Arthritis    right thumb CMC arthritis   Asthmatic bronchitis 2017   Cancer (HCC)    -NHL R parotid   Cataract    bilateral small- removed both eyes    Colon cancer (HCC)    stage 1- at appendiceal orifice    Complication of anesthesia    has dry mouth anyway from shograns-sore throats post op   Coronary artery calcification seen on CAT scan 11/14/2019    Depression    since 1982    Diverticulosis    Fibromyalgia    GERD (gastroesophageal reflux disease)    past hx    Heart murmur    Hyperlipidemia    controlled - metabolic syndrome per pt- goes up and down    Hypertension    Lung infiltrate on CT 09/14/2015   Meningioma (HCC) 11/2009   right post parietal 16mm - stable 11/2009 MRI   Nephrolithiasis    Nephrolithiasis    Neuromuscular disorder (HCC)    fibromyalgia   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   Pure hypercholesterolemia 11/14/2019   Sjogren's disease (HCC)     Past Surgical History:  Procedure Laterality Date   ABDOMINAL HYSTERECTOMY     APPENDECTOMY  2008   ARTERY BIOPSY  02/03/2013   Procedure: MINOR BIOPSY TEMPORAL ARTERY;  Surgeon: Darletta Moll, MD;  Location: [facility];  Service: ENT;;   COLONOSCOPY     KIDNEY STONE SURGERY  1974   LEFT HEART CATH AND CORONARY ANGIOGRAPHY N/A 12/01/2019   Procedure: LEFT HEART CATH AND CORONARY ANGIOGRAPHY;  Surgeon: Lennette Bihari, MD;  Location: [facility];  Service: Cardiovascular;  Laterality: N/A;   MASS EXCISION  2008   abd-benign   PAROTIDECTOMY  Right 08/01/2015   Procedure: RIGHT PAROTIDECTOMY;  Surgeon: Newman Pies, MD;  Location: [facility];  Service: ENT;  Laterality: Right;   PAROTIDECTOMY Left 04/04/2021   Procedure: LEFT PAROTIDECTOMY;  Surgeon: Newman Pies, MD;  Location: [facility];  Service: ENT;  Laterality: Left;   POLYPECTOMY     TONSILLECTOMY     VAGINAL HYSTERECTOMY  1989     Current Outpatient Medications:    acetaminophen (TYLENOL) 500 MG tablet, Take 500 mg by mouth every 6 (six) hours as needed for moderate pain., Disp: , Rfl:    Artificial Tear Ointment (DRY EYES OP), Apply 1 drop to eye as needed (Dry eye)., Disp: , Rfl:    Calcium Carbonate (CALCIUM 500 PO), Take 500 mg by mouth every other day., Disp: , Rfl:    Cholecalciferol (VITAMIN D3) 50 MCG (2000 UT) TABS, Take 1 tablet  by mouth daily., Disp: , Rfl:    Cyanocobalamin (B-12 PO), Take 1 tablet by mouth daily., Disp: , Rfl:    hydrALAZINE (APRESOLINE) 50 MG tablet, Take 1 tablet (50 mg total) by mouth in the morning and at bedtime. 12 HOURS APART, Disp: 180 tablet, Rfl: 3   metoprolol tartrate (LOPRESSOR) 25 MG tablet, TAKE 1 TABLET(25 MG) BY MOUTH TWICE DAILY AS NEEDED FOR PALPITATIONS (Patient taking differently: Take 12.5 mg by mouth 2 (two) times daily as needed (Palpitations).), Disp: 180 tablet, Rfl: 3   PREVIDENT 5000 ENAMEL PROTECT 1.1-5 % GEL, 1 Application by Other route at bedtime., Disp: , Rfl:    valsartan (DIOVAN) 160 MG tablet, Take 1 tablet (160 mg total) by mouth daily., Disp: 90 tablet, Rfl: 3  Allergies  Allergen Reactions   Lisinopril    ROS neg/noncontributory except as noted HPI/below Patient complains of hair thinning for past several years, but getting worse.  She was given reading an article in [place] which suggested some vitamin levels.     Objective:     BP 124/62 (BP Location: Left Arm, Patient Position: Sitting, Cuff Size: Large)   Pulse 83   Temp 97.6 F (36.4 C) (Temporal)   Resp 18   Ht 5\' 9"  (1.753 m)   Wt 198 lb 4 oz (89.9 kg)   SpO2 97%   BMI 29.28 kg/m  Wt Readings from Last 3 Encounters:  05/01/23 198 lb 4 oz (89.9 kg)  04/17/23 194 lb 12.8 oz (88.4 kg)  04/16/23 196 lb (88.9 kg)    Physical Exam   Gen: WDWN NAD HEENT: NCAT, conjunctiva not injected, sclera nonicteric NECK:  supple, no thyromegaly, no nodes, no carotid bruits CARDIAC: RRR, S1S2+, 2/6 murmur. DP 2+B LUNGS: CTAB. No wheezes ABDOMEN:  BS+, soft, NTND, No HSM, no masses EXT:  no edema MSK: no gross abnormalities.  NEURO: A&O x3.  CN II-XII intact.  PSYCH: normal mood. Good eye contact     Assessment & Plan:  Essential hypertension Assessment & Plan: Chronic.  Controlled.  Continue hydralazine 50 mg 3 times daily.  Continue Lopressor 12.5 mg twice daily.  Continue valsartan 160 mg  daily.   Coronary artery calcification Assessment & Plan: Chronic.  Has been advised to take a statin.  Currently, patient is not taking it as she thought it was causing itching.  However, the itching continues off of the statin.  She is willing to retry, but would like her lipids checked first   Pruritus  SJOGREN'S SYNDROME Assessment & Plan: Chronic.  Stable. Not on tx-pt not want to see  rheum   Mixed hyperlipidemia Assessment & Plan: Chronic.  Patient is not on any medications.  Will check lipids.  Probably needs to be back on simvastatin due to coronary calcifications  Orders: -     Lipid panel  Prediabetes Assessment & Plan: Chronic.  Controlled.  Patient trying to work on diet.  Orders: -     Hemoglobin A1c  Hair loss -     TSH  Atypical chest pain Assessment & Plan: She has had workup in the past.  Has seen cardiology.  She now has a new type of chest pain.  She would like to monitor it for now, if worse ER.  Advised to follow-up with cardiology as well if it continues   Hair loss-multifactorial.  May be age, medications, stress, Sjogren's.  Vitamin levels have been normal.  Advise she can take biotin supplements if she would like.  Will check TSH  Return in about 3 months (around 07/31/2023) for cholesterol.  Germaine Pomfret Rice,acting as a scribe for Angelena Sole, MD.,have documented all relevant documentation on the behalf of Angelena Sole, MD,as directed by  Angelena Sole, MD while in the presence of Angelena Sole, MD.  I, Angelena Sole, MD, have reviewed all documentation for this visit. The documentation on 05/01/23 for the exam, diagnosis, procedures, and orders are all accurate and complete.   Angelena Sole, MD

## 2023-05-01 NOTE — Assessment & Plan Note (Signed)
Chronic.  Controlled.  Patient trying to work on diet.

## 2023-05-01 NOTE — Assessment & Plan Note (Signed)
Chronic.  Stable. Not on tx-pt not want to see rheum

## 2023-05-02 NOTE — Progress Notes (Signed)
1.  Sugars stable. 2.  Cholesterol high.  Is she willing to do rosuvastatin 10mg  daily(rather than simvastatin)?

## 2023-05-06 ENCOUNTER — Telehealth: Payer: Self-pay | Admitting: Family Medicine

## 2023-05-06 NOTE — Telephone Encounter (Signed)
Patient states she was returning QuaNeisha's call in regards to lab results. States that Q can leave lab result findings on VM in the case that she doesn't answer.

## 2023-05-15 ENCOUNTER — Other Ambulatory Visit: Payer: Self-pay | Admitting: Family Medicine

## 2023-05-20 ENCOUNTER — Other Ambulatory Visit: Payer: Self-pay | Admitting: Family Medicine

## 2023-06-03 ENCOUNTER — Other Ambulatory Visit (HOSPITAL_BASED_OUTPATIENT_CLINIC_OR_DEPARTMENT_OTHER): Payer: Self-pay | Admitting: Nurse Practitioner

## 2023-06-06 ENCOUNTER — Other Ambulatory Visit: Payer: Self-pay | Admitting: *Deleted

## 2023-06-06 ENCOUNTER — Telehealth: Payer: Self-pay | Admitting: Family Medicine

## 2023-06-06 MED ORDER — VALSARTAN 160 MG PO TABS: 160.0000 mg | ORAL_TABLET | Freq: Every day | ORAL | 1 refills | Status: DC

## 2023-06-06 NOTE — Telephone Encounter (Signed)
Prescription Request  06/06/2023  LOV: 05/01/2023  What is the name of the medication or equipment? valsartan (DIOVAN) 160 MG tablet   I informed patient medication is already at pharmacy but she declined to pick it up since not written by PCP. States was written by a former provider she hadn't seen in a year.  Have you contacted your pharmacy to request a refill? Yes   Which pharmacy would you like this sent to?  [id number] [place], [place] - [address] AT [facility] [address] [facility] [address] [facility] [address] [place] [address] Phone: [phone] Fax: [phone]    Patient notified that their request is being sent to the clinical staff for review and that they should receive a response within 2 business days.   Please advise at Mobile [phone] (mobile)

## 2023-06-06 NOTE — Telephone Encounter (Signed)
Rx sent to the pharmacy.

## 2023-07-31 ENCOUNTER — Ambulatory Visit: Payer: PPO | Admitting: Family Medicine

## 2023-08-07 ENCOUNTER — Encounter: Payer: Self-pay | Admitting: Family Medicine

## 2023-08-07 ENCOUNTER — Ambulatory Visit: Payer: PPO | Admitting: Family Medicine

## 2023-08-07 VITALS — BP 130/66 | HR 73 | Temp 97.7°F | Resp 16 | Ht 69.0 in | Wt 198.4 lb

## 2023-08-07 DIAGNOSIS — I10 Essential (primary) hypertension: Secondary | ICD-10-CM | POA: Diagnosis not present

## 2023-08-07 DIAGNOSIS — E782 Mixed hyperlipidemia: Secondary | ICD-10-CM | POA: Diagnosis not present

## 2023-08-07 DIAGNOSIS — L299 Pruritus, unspecified: Secondary | ICD-10-CM

## 2023-08-07 LAB — LIPID PANEL
Cholesterol: 163 mg/dL (ref 0–200)
HDL: 58.3 mg/dL (ref >39.00)
LDL Cholesterol: 73 mg/dL (ref 0–99)
NonHDL: 104.54
Total CHOL/HDL Ratio: 3
Triglycerides: 159 mg/dL — ABNORMAL HIGH (ref 0.0–149.0)
VLDL: 31.8 mg/dL (ref 0.0–40.0)

## 2023-08-07 LAB — COMPREHENSIVE METABOLIC PANEL
ALT: 15 U/L (ref 0–35)
AST: 20 U/L (ref 0–37)
Albumin: 4.2 g/dL (ref 3.5–5.2)
Alkaline Phosphatase: 83 U/L (ref 39–117)
BUN: 13 mg/dL (ref 6–23)
CO2: 25 meq/L (ref 19–32)
Calcium: 10.1 mg/dL (ref 8.4–10.5)
Chloride: 104 meq/L (ref 96–112)
Creatinine, Ser: 0.61 mg/dL (ref 0.40–1.20)
GFR: 83.16 mL/min (ref >60.00)
Glucose, Bld: 81 mg/dL (ref 70–99)
Potassium: 3.8 meq/L (ref 3.5–5.1)
Sodium: 137 meq/L (ref 135–145)
Total Bilirubin: 0.4 mg/dL (ref 0.2–1.2)
Total Protein: 7.5 g/dL (ref 6.0–8.3)

## 2023-08-07 LAB — CBC WITH DIFFERENTIAL/PLATELET
Basophils Absolute: 0 10*3/uL (ref 0.0–0.1)
Basophils Relative: 0.6 % (ref 0.0–3.0)
Eosinophils Absolute: 0.1 10*3/uL (ref 0.0–0.7)
Eosinophils Relative: 2 % (ref 0.0–5.0)
HCT: 34.8 % — ABNORMAL LOW (ref 36.0–46.0)
Hemoglobin: 11.6 g/dL — ABNORMAL LOW (ref 12.0–15.0)
Lymphocytes Relative: 13.1 % (ref 12.0–46.0)
Lymphs Abs: 0.9 10*3/uL (ref 0.7–4.0)
MCHC: 33.4 g/dL (ref 30.0–36.0)
MCV: 91.3 fL (ref 78.0–100.0)
Monocytes Absolute: 0.7 10*3/uL (ref 0.1–1.0)
Monocytes Relative: 10.5 % (ref 3.0–12.0)
Neutro Abs: 4.8 10*3/uL (ref 1.4–7.7)
Neutrophils Relative %: 73.8 % (ref 43.0–77.0)
Platelets: 226 10*3/uL (ref 150.0–400.0)
RBC: 3.81 Mil/uL — ABNORMAL LOW (ref 3.87–5.11)
RDW: 13.6 % (ref 11.5–15.5)
WBC: 6.6 10*3/uL (ref 4.0–10.5)

## 2023-08-07 MED ORDER — HYDRALAZINE HCL 50 MG PO TABS
50.0000 mg | ORAL_TABLET | Freq: Two times a day (BID) | ORAL | 3 refills | Status: DC
Start: 2023-08-07 — End: 2024-02-06

## 2023-08-07 NOTE — Progress Notes (Signed)
Subjective:     Patient ID: [name], female    DOB: 1941-07-06, 82 y.o.   MRN: [id number]  Chief Complaint  Patient presents with   Medical Management of Chronic Issues    3 month follow-up on hld Not fasting    HPI HTN - Pt is on  hydralazine 50mg  bid, valsartan 160mg  daily, Bp's running not checked  . No ha/dizziness/cp/palp/edema/cough/sob.  Itching - Reports intense itchy that has traveled across her entire body from her scalp to her feet.  She thought it was due to the simvastatin, however it continues off the simvastatin.  There is no rash.  She is wondering if it is her autoimmune disorder as labs have been negative as well  has had for years. HLD-on zocor 20mg   Palpitations -   She did see the cardiologist in July. Occ takes metoprolol  Stress/ Anxiety - Complains that dealing with her sister has been difficult and reports increased stress levels but coming more at peace. No SI  Health Maintenance Due  Topic Date Due   Colonoscopy  03/05/2023    Past Medical History:  Diagnosis Date   Anxiety    Arthritis    right thumb CMC arthritis   Asthmatic bronchitis 2017   Cancer (HCC)    -NHL R parotid   Cataract    bilateral small- removed both eyes    Colon cancer (HCC)    stage 1- at appendiceal orifice    Complication of anesthesia    has dry mouth anyway from shograns-sore throats post op   Coronary artery calcification seen on CAT scan 11/14/2019   Depression    since 1982    Diverticulosis    Fibromyalgia    GERD (gastroesophageal reflux disease)    past hx    Heart murmur    Hyperlipidemia    controlled - metabolic syndrome per pt- goes up and down    Hypertension    Lung infiltrate on CT 09/14/2015   Meningioma (HCC) 11/2009   right post parietal 16mm - stable 11/2009 MRI   Nephrolithiasis    Nephrolithiasis    Neuromuscular disorder (HCC)    fibromyalgia   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   Pure  hypercholesterolemia 11/14/2019   Sjogren's disease (HCC)     Past Surgical History:  Procedure Laterality Date   ABDOMINAL HYSTERECTOMY     APPENDECTOMY  2008   ARTERY BIOPSY  02/03/2013   Procedure: MINOR BIOPSY TEMPORAL ARTERY;  Surgeon: Darletta Moll, MD;  Location: [facility];  Service: ENT;;   COLONOSCOPY     KIDNEY STONE SURGERY  1974   LEFT HEART CATH AND CORONARY ANGIOGRAPHY N/A 12/01/2019   Procedure: LEFT HEART CATH AND CORONARY ANGIOGRAPHY;  Surgeon: Lennette Bihari, MD;  Location: [facility];  Service: Cardiovascular;  Laterality: N/A;   MASS EXCISION  2008   abd-benign   PAROTIDECTOMY Right 08/01/2015   Procedure: RIGHT PAROTIDECTOMY;  Surgeon: Newman Pies, MD;  Location: [facility];  Service: ENT;  Laterality: Right;   PAROTIDECTOMY Left 04/04/2021   Procedure: LEFT PAROTIDECTOMY;  Surgeon: Newman Pies, MD;  Location: [facility];  Service: ENT;  Laterality: Left;   POLYPECTOMY     TONSILLECTOMY     VAGINAL HYSTERECTOMY  1989     Current Outpatient Medications:    acetaminophen (TYLENOL) 500 MG tablet, Take 500 mg by mouth every 6 (six) hours as needed for  moderate pain., Disp: , Rfl:    Artificial Tear Ointment (DRY EYES OP), Apply 1 drop to eye as needed (Dry eye)., Disp: , Rfl:    Calcium Carbonate (CALCIUM 500 PO), Take 500 mg by mouth every other day., Disp: , Rfl:    Cholecalciferol (VITAMIN D3) 50 MCG (2000 UT) TABS, Take 1 tablet by mouth daily., Disp: , Rfl:    clotrimazole-betamethasone (LOTRISONE) cream, 1 application to affected area Externally Twice a day for 14 days, Disp: , Rfl:    Cyanocobalamin (B-12 PO), Take 1 tablet by mouth daily., Disp: , Rfl:    Magnesium 300 MG CAPS, 1 capsule with a meal Orally Once a day, Disp: , Rfl:    metoprolol tartrate (LOPRESSOR) 25 MG tablet, TAKE 1 TABLET(25 MG) BY MOUTH TWICE DAILY AS NEEDED FOR PALPITATIONS (Patient taking differently: Take 12.5 mg by mouth 2 (two) times daily  as needed (Palpitations).), Disp: 180 tablet, Rfl: 3   Oyster Shell Calcium 500 MG TABS, 1 tablet with meals Orally Twice a day for 30 day(s), Disp: , Rfl:    POTASSIUM PO, 1 tablet Orally Once a day, Disp: , Rfl:    PREVIDENT 5000 ENAMEL PROTECT 1.1-5 % GEL, 1 Application by Other route at bedtime., Disp: , Rfl:    simvastatin (ZOCOR) 20 MG tablet, TAKE 1 TABLET(20 MG) BY MOUTH AT BEDTIME, Disp: 90 tablet, Rfl: 3   valsartan (DIOVAN) 160 MG tablet, Take 1 tablet (160 mg total) by mouth daily., Disp: 90 tablet, Rfl: 1   hydrALAZINE (APRESOLINE) 50 MG tablet, Take 1 tablet (50 mg total) by mouth in the morning and at bedtime. 12 HOURS APART, Disp: 180 tablet, Rfl: 3  Allergies  Allergen Reactions   Clindamycin Hcl     Other reaction(s): Diarrhea, rash  Other Reaction(s): Diarrhea, rash   Lisinopril    Losartan Potassium     Other reaction(s): somnolence  Other Reaction(s): somnolence   Metoprolol Tartrate     Other reaction(s): Diarrhea  Other Reaction(s): Diarrhea   Amoxicillin Rash    Other reaction(s): rash   ROS neg/noncontributory except as noted HPI/below R ear-pierced long time ago.  Past few days, some swelling R ear lobe and blood and crusted.  So H2O2 and neosporin.   One night while lying in bed-sharp pain under L ankle and lasted few minutes and then gone. Per pt, ?tendonitis.   Some numbness legs at times-neuropathy.      Objective:     BP 130/66   Pulse 73   Temp 97.7 F (36.5 C) (Temporal)   Resp 16   Ht 5\' 9"  (1.753 m)   Wt 198 lb 6 oz (90 kg)   SpO2 96%   BMI 29.29 kg/m  Wt Readings from Last 3 Encounters:  08/07/23 198 lb 6 oz (90 kg)  05/01/23 198 lb 4 oz (89.9 kg)  04/17/23 194 lb 12.8 oz (88.4 kg)    Physical Exam   Gen: WDWN NAD HEENT: NCAT, conjunctiva not injected, sclera nonicteric NECK:  supple, no thyromegaly, no nodes, no carotid bruits CARDIAC: RRR, S1S2+, 2/6 murmur. DP 2+B LUNGS: CTAB. No wheezes ABDOMEN:  BS+, soft, NTND, No  HSM, no masses EXT:  no edema MSK: no gross abnormalities.  NEURO: A&O x3.  CN II-XII intact.  PSYCH: normal mood. Good eye contact  R ear lobe-some crusted blood on back.  No d/c.     Assessment & Plan:  Mixed hyperlipidemia Assessment & Plan: Chronic. Not controlled.  Back on  simvastatin 20mg  daily  Orders: -     Comprehensive metabolic panel -     Lipid panel  Essential hypertension Assessment & Plan: Chronic.  Controlled.  Continue hydralazine 50 mg 2 times daily. valsartan 160 mg daily.  Orders: -     Comprehensive metabolic panel -     CBC with Differential/Platelet -     hydrALAZINE HCl; Take 1 tablet (50 mg total) by mouth in the morning and at bedtime. 12 HOURS APART  Dispense: 180 tablet; Refill: 3  Pruritus Assessment & Plan: Chronic.  Try zyrtec daily   For ear lobe-neosporin bid x few days. Worse, let us know  Return in about 6 months (around 02/05/2024) for chronic follow-up.    Angelena Sole, MD

## 2023-08-07 NOTE — Patient Instructions (Signed)
It was very nice to see you today!  Happy Holidays!  Try zyrtec(ceterizine) daily to see if itching improves   PLEASE NOTE:  If you had any lab tests please let us know if you have not heard back within a few days. You may see your results on MyChart before we have a chance to review them but we will give you a call once they are reviewed by [place]. If we ordered any referrals today, please let us know if you have not heard from their office within the next week.   Please try these tips to maintain a healthy lifestyle:  Eat most of your calories during the day when you are active. Eliminate processed foods including packaged sweets (pies, cakes, cookies), reduce intake of potatoes, white bread, white pasta, and white rice. Look for whole grain options, oat flour or almond flour.  Each meal should contain half fruits/vegetables, one quarter protein, and one quarter carbs (no bigger than a computer mouse).  Cut down on sweet beverages. This includes juice, soda, and sweet tea. Also watch fruit intake, though this is a healthier sweet option, it still contains natural sugar! Limit to 3 servings daily.  Drink at least 1 glass of water with each meal and aim for at least 8 glasses per day  Exercise at least 150 minutes every week.

## 2023-08-07 NOTE — Assessment & Plan Note (Signed)
Chronic.  Controlled.  Continue hydralazine 50 mg 2 times daily. valsartan 160 mg daily.

## 2023-08-07 NOTE — Assessment & Plan Note (Signed)
Chronic.  Try zyrtec daily

## 2023-08-07 NOTE — Assessment & Plan Note (Signed)
Chronic. Not controlled.  Back on simvastatin 20mg  daily

## 2023-08-07 NOTE — Progress Notes (Signed)
Labs stable/at goal.  Continue same meds

## 2023-09-18 DIAGNOSIS — Z85828 Personal history of other malignant neoplasm of skin: Secondary | ICD-10-CM | POA: Diagnosis not present

## 2023-09-18 DIAGNOSIS — L821 Other seborrheic keratosis: Secondary | ICD-10-CM | POA: Diagnosis not present

## 2023-09-18 DIAGNOSIS — L0109 Other impetigo: Secondary | ICD-10-CM | POA: Diagnosis not present

## 2023-09-18 DIAGNOSIS — D2262 Melanocytic nevi of left upper limb, including shoulder: Secondary | ICD-10-CM | POA: Diagnosis not present

## 2023-09-18 DIAGNOSIS — L814 Other melanin hyperpigmentation: Secondary | ICD-10-CM | POA: Diagnosis not present

## 2023-09-18 DIAGNOSIS — D1801 Hemangioma of skin and subcutaneous tissue: Secondary | ICD-10-CM | POA: Diagnosis not present

## 2023-10-02 ENCOUNTER — Telehealth: Payer: Self-pay | Admitting: Gastroenterology

## 2023-10-02 ENCOUNTER — Encounter: Payer: Self-pay | Admitting: Family Medicine

## 2023-10-02 ENCOUNTER — Ambulatory Visit: Payer: Self-pay | Admitting: Family Medicine

## 2023-10-02 ENCOUNTER — Ambulatory Visit: Payer: PPO | Admitting: Family Medicine

## 2023-10-02 VITALS — BP 162/62 | HR 88 | Temp 97.6°F | Resp 18 | Ht 69.0 in | Wt 197.2 lb

## 2023-10-02 DIAGNOSIS — R1030 Lower abdominal pain, unspecified: Secondary | ICD-10-CM

## 2023-10-02 DIAGNOSIS — R5383 Other fatigue: Secondary | ICD-10-CM

## 2023-10-02 DIAGNOSIS — K625 Hemorrhage of anus and rectum: Secondary | ICD-10-CM | POA: Diagnosis not present

## 2023-10-02 DIAGNOSIS — R194 Change in bowel habit: Secondary | ICD-10-CM | POA: Diagnosis not present

## 2023-10-02 LAB — CBC WITH DIFFERENTIAL/PLATELET
Basophils Absolute: 0 10*3/uL (ref 0.0–0.1)
Basophils Relative: 0.7 % (ref 0.0–3.0)
Eosinophils Absolute: 0.1 10*3/uL (ref 0.0–0.7)
Eosinophils Relative: 1.9 % (ref 0.0–5.0)
HCT: 36.2 % (ref 36.0–46.0)
Hemoglobin: 12 g/dL (ref 12.0–15.0)
Lymphocytes Relative: 15.5 % (ref 12.0–46.0)
Lymphs Abs: 0.9 10*3/uL (ref 0.7–4.0)
MCHC: 33 g/dL (ref 30.0–36.0)
MCV: 90.8 fL (ref 78.0–100.0)
Monocytes Absolute: 0.4 10*3/uL (ref 0.1–1.0)
Monocytes Relative: 7.9 % (ref 3.0–12.0)
Neutro Abs: 4.1 10*3/uL (ref 1.4–7.7)
Neutrophils Relative %: 74 % (ref 43.0–77.0)
Platelets: 227 10*3/uL (ref 150.0–400.0)
RBC: 3.99 Mil/uL (ref 3.87–5.11)
RDW: 13 % (ref 11.5–15.5)
WBC: 5.5 10*3/uL (ref 4.0–10.5)

## 2023-10-02 LAB — SEDIMENTATION RATE: Sed Rate: 50 mm/h — ABNORMAL HIGH (ref 0–30)

## 2023-10-02 LAB — COMPREHENSIVE METABOLIC PANEL
ALT: 11 U/L (ref 0–35)
AST: 16 U/L (ref 0–37)
Albumin: 4.4 g/dL (ref 3.5–5.2)
Alkaline Phosphatase: 81 U/L (ref 39–117)
BUN: 13 mg/dL (ref 6–23)
CO2: 28 meq/L (ref 19–32)
Calcium: 9.7 mg/dL (ref 8.4–10.5)
Chloride: 103 meq/L (ref 96–112)
Creatinine, Ser: 0.61 mg/dL (ref 0.40–1.20)
GFR: 83.07 mL/min (ref >60.00)
Glucose, Bld: 100 mg/dL — ABNORMAL HIGH (ref 70–99)
Potassium: 4.8 meq/L (ref 3.5–5.1)
Sodium: 139 meq/L (ref 135–145)
Total Bilirubin: 0.4 mg/dL (ref 0.2–1.2)
Total Protein: 8 g/dL (ref 6.0–8.3)

## 2023-10-02 LAB — IBC + FERRITIN
Ferritin: 36.3 ng/mL (ref 10.0–291.0)
Iron: 58 ug/dL (ref 42–145)
Saturation Ratios: 15 % — ABNORMAL LOW (ref 20.0–50.0)
TIBC: 386.4 ug/dL (ref 250.0–450.0)
Transferrin: 276 mg/dL (ref 212.0–360.0)

## 2023-10-02 LAB — TSH: TSH: 2.41 u[IU]/mL (ref 0.35–5.50)

## 2023-10-02 LAB — VITAMIN B12: Vitamin B-12: 395 pg/mL (ref 211–911)

## 2023-10-02 NOTE — Telephone Encounter (Signed)
Spoke with the pt who describes concern over the recall date of colon in 2026.  She states she has developed loose stools and BRB when wiping.  She has no other symptoms or complaints. She is very worried that her next colon is in 2026 because she says her parents both passed from colon cancer.  I have her set up to see Deanna May on 2/24 at 130 in a 7 day hold spot.  She is very anxious about the timing.  She is concerned about waiting until 2/24 to be seen. I did advise that she can call her PCP in the meantime for eval.  The pt has been advised of the information and verbalized understanding.

## 2023-10-02 NOTE — Progress Notes (Signed)
Labs ok except sed rate elevated(has been for many years-inflammation)

## 2023-10-02 NOTE — Progress Notes (Signed)
Subjective:     Patient ID: [name], female    DOB: 07/02/1941, 83 y.o.   MRN: [id number]  Chief Complaint  Patient presents with   Rectal Bleeding    Rectal bleeding and loose stools for over 2 months Went to urinate this morning, had loose stool, when wiping saw blood, happened one more time since 1 am this morning Has family history of      HPI Discussed the use of AI scribe software for clinical note transcription with the patient, who gave verbal consent to proceed.  History of Present Illness   Jillian RENNAKER "MAGGIE" is an 83 year old female with a history of precancerous colon polyps who presents with changes in bowel habits and rectal bleeding.  Over the past two to three months, she has experienced changes in bowel habits, including loose stools and diarrhea. She reports a low-grade ache across her abdomen and rectal discharge during urination. Although she denies blood in her stool,  until today-she noticed bright red blood on toilet paper after wiping twice on the morning of the visit. She is concerned about her history of precancerous polyps, as her last four colonoscopies revealed such findings, and she is anxious about the delay in scheduling her next colonoscopy until 2026, especially since her 39 year old sister recently had one.  She feels significant fatigue and joint pain, which she attributes to her underlying Sjogren's syndrome. She is emotionally distressed due to personal and family issues, including the anniversary of her son's death and her sister's worsening dementia. She feels overwhelmed and worn out with the state of affairs in the country.  Some recent thoughts of suicide, though she denies having a plan.  has a lot to live for.  declines counseling and doesn't feel she needs helo. She is dealing with significant stressors, including family issues and past trauma.  She mentions irritation and slight bleeding from her pierced ears. She was prescribed  mupirocin ointment by her dermatologist, but it has not resolved the issue. She suspects her Sjogren's syndrome may be contributing to this problem.       Health Maintenance Due  Topic Date Due   Colonoscopy  03/05/2023    Past Medical History:  Diagnosis Date   Anxiety    Arthritis    right thumb CMC arthritis   Asthmatic bronchitis 2017   Cancer (HCC)    -NHL R parotid   Cataract    bilateral small- removed both eyes    Colon cancer (HCC)    stage 1- at appendiceal orifice    Complication of anesthesia    has dry mouth anyway from shograns-sore throats post op   Coronary artery calcification seen on CAT scan 11/14/2019   Depression    since 1982    Diverticulosis    Fibromyalgia    GERD (gastroesophageal reflux disease)    past hx    Heart murmur    Hyperlipidemia    controlled - metabolic syndrome per pt- goes up and down    Hypertension    Lung infiltrate on CT 09/14/2015   Meningioma (HCC) 11/2009   right post parietal 16mm - stable 11/2009 MRI   Nephrolithiasis    Nephrolithiasis    Neuromuscular disorder (HCC)    fibromyalgia   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   Pure hypercholesterolemia 11/14/2019   Sjogren's disease (HCC)     Past Surgical History:  Procedure Laterality Date   ABDOMINAL HYSTERECTOMY  APPENDECTOMY  2008   ARTERY BIOPSY  02/03/2013   Procedure: MINOR BIOPSY TEMPORAL ARTERY;  Surgeon: Darletta Moll, MD;  Location: [facility];  Service: ENT;;   COLONOSCOPY     KIDNEY STONE SURGERY  1974   LEFT HEART CATH AND CORONARY ANGIOGRAPHY N/A 12/01/2019   Procedure: LEFT HEART CATH AND CORONARY ANGIOGRAPHY;  Surgeon: Lennette Bihari, MD;  Location: [facility];  Service: Cardiovascular;  Laterality: N/A;   MASS EXCISION  2008   abd-benign   PAROTIDECTOMY Right 08/01/2015   Procedure: RIGHT PAROTIDECTOMY;  Surgeon: Newman Pies, MD;  Location: [facility];  Service: ENT;  Laterality: Right;    PAROTIDECTOMY Left 04/04/2021   Procedure: LEFT PAROTIDECTOMY;  Surgeon: Newman Pies, MD;  Location: [facility];  Service: ENT;  Laterality: Left;   POLYPECTOMY     TONSILLECTOMY     VAGINAL HYSTERECTOMY  1989     Current Outpatient Medications:    acetaminophen (TYLENOL) 500 MG tablet, Take 500 mg by mouth every 6 (six) hours as needed for moderate pain., Disp: , Rfl:    Artificial Tear Ointment (DRY EYES OP), Apply 1 drop to eye as needed (Dry eye)., Disp: , Rfl:    Calcium Carbonate (CALCIUM 500 PO), Take 500 mg by mouth every other day., Disp: , Rfl:    Cholecalciferol (VITAMIN D3) 50 MCG (2000 UT) TABS, Take 1 tablet by mouth daily., Disp: , Rfl:    clotrimazole-betamethasone (LOTRISONE) cream, 1 application to affected area Externally Twice a day for 14 days, Disp: , Rfl:    Cyanocobalamin (B-12 PO), Take 1 tablet by mouth daily., Disp: , Rfl:    hydrALAZINE (APRESOLINE) 50 MG tablet, Take 1 tablet (50 mg total) by mouth in the morning and at bedtime. 12 HOURS APART, Disp: 180 tablet, Rfl: 3   Magnesium 300 MG CAPS, 1 capsule with a meal Orally Once a day, Disp: , Rfl:    metoprolol tartrate (LOPRESSOR) 25 MG tablet, TAKE 1 TABLET(25 MG) BY MOUTH TWICE DAILY AS NEEDED FOR PALPITATIONS (Patient taking differently: Take 12.5 mg by mouth 2 (two) times daily as needed (Palpitations).), Disp: 180 tablet, Rfl: 3   mupirocin ointment (BACTROBAN) 2 %, SMARTSIG:1 Application Topical 2-3 Times Daily, Disp: , Rfl:    Oyster Shell Calcium 500 MG TABS, 1 tablet with meals Orally Twice a day for 30 day(s), Disp: , Rfl:    POTASSIUM PO, 1 tablet Orally Once a day, Disp: , Rfl:    PREVIDENT 5000 ENAMEL PROTECT 1.1-5 % GEL, 1 Application by Other route at bedtime., Disp: , Rfl:    simvastatin (ZOCOR) 20 MG tablet, TAKE 1 TABLET(20 MG) BY MOUTH AT BEDTIME, Disp: 90 tablet, Rfl: 3   valsartan (DIOVAN) 160 MG tablet, Take 1 tablet (160 mg total) by mouth daily., Disp: 90 tablet, Rfl:  1  Allergies  Allergen Reactions   Clindamycin Hcl     Other reaction(s): Diarrhea, rash  Other Reaction(s): Diarrhea, rash   Lisinopril    Losartan Potassium     Other reaction(s): somnolence  Other Reaction(s): somnolence   Metoprolol Tartrate     Other reaction(s): Diarrhea  Other Reaction(s): Diarrhea   Amoxicillin Rash    Other reaction(s): rash   ROS neg/noncontributory except as noted HPI/below      Objective:     BP (!) 162/62 (BP Location: Left Arm, Patient Position: Sitting, Cuff Size: Normal)   Pulse 88   Temp 97.6 F (36.4  C) (Temporal)   Resp 18   Ht 5\' 9"  (1.753 m)   Wt 197 lb 4 oz (89.5 kg)   SpO2 97%   BMI 29.13 kg/m  Wt Readings from Last 3 Encounters:  10/02/23 197 lb 4 oz (89.5 kg)  08/07/23 198 lb 6 oz (90 kg)  05/01/23 198 lb 4 oz (89.9 kg)    Physical Exam   Gen: WDWN NAD HEENT: NCAT, conjunctiva not injected, sclera nonicteric NECK:  supple, no thyromegaly, no nodes, no carotid bruits CARDIAC: RRR, S1S2+, +2/6  murmur.  ABDOMEN:  BS+, soft, sl tender lower abd, No HSM, no masses EXT:  no edema MSK: no gross abnormalities.  NEURO: A&O x3.  CN II-XII intact.  PSYCH: normal mood. Good eye contact Rectal-guiac neg. No lesions     Assessment & Plan:  Lower abdominal pain -     CBC with Differential/Platelet -     Comprehensive metabolic panel -     TSH -     IBC + Ferritin -     Vitamin B12 -     Sedimentation rate -     CT ABDOMEN PELVIS W CONTRAST; Future  Change in bowel habit -     CBC with Differential/Platelet -     Comprehensive metabolic panel -     TSH -     IBC + Ferritin -     Vitamin B12 -     Sedimentation rate -     CT ABDOMEN PELVIS W CONTRAST; Future  Rectal bleeding -     CBC with Differential/Platelet -     Comprehensive metabolic panel -     TSH -     IBC + Ferritin -     Vitamin B12 -     Sedimentation rate -     CT ABDOMEN PELVIS W CONTRAST; Future  Fatigue, unspecified type -      Comprehensive metabolic panel -     TSH  Assessment and Plan    Change in Bowel Habits with Rectal Bleeding   There has been a significant change in bowel habits over the past 2-3 months, with loose stools and today only-2 episodes of rectal bleeding. Given a history of pre-cancerous polyps, there is concern about colon cancer. also h/o appendix vs colon ca. A rectal exam showed small hemorrhoids but no obvious bleeding source. . Bright red blood is likely from a lower GI source such as hemorrhoids or a tear from frequent loose stools. Confirm and attend the gastroenterology appointment on February 24th. Order a CT scan of the abdomen and perform blood work to check for anemia and other abnormalities.  she is distraught that she can't be seen sooner in GI and I explained that 2/24 is fine.  Depression with Suicidal Ideation   There are reports of feeling extremely down with recent thoughts of suicide, though no specific plan. Emotional distress is linked to the anniversary of her son's suicide and ongoing personal stressors. The importance of mental health support and potential benefits of seeing a psychiatrist were discussed. Encouragement was given to update her will and take care of personal affairs as a coping mechanism.   Chronic Fatigue   There is a report of feeling completely worn out both physically and mentally, worsened by personal stressors including a sister's dementia and the recent election. Generalized body aches and joint pain are also reported. Order blood work to evaluate for anemia and other potential causes of fatigue.  Recurrent Ear  Lobe skin nfection   There is a persistent infection in pierced ears, not resolved with mupirocin ointment, with occasional bleeding from the site. Continue mupirocin ointment and f/uderm.   elevated bp-pt upset today-monitor.  has been normal  General Health Maintenance   Routine health maintenance, including colonoscopy screening, is due and  has been delayed. There is concern about elevated blood pressure during the visit. Encourage attending the scheduled colonoscopy. Monitor blood pressure and adjust medications if necessary.  Follow-up   Confirm and attend the gastroenterology appointment on February 24th. Follow up with primary care after the gastroenterology appointment to discuss results and next steps.        Return if symptoms worsen or fail to improve.  Angelena Sole, MD

## 2023-10-02 NOTE — Telephone Encounter (Signed)
Inbound call from patient stating early this morning she started to have blood out of rectum. Also stated she is concerned about waiting to have colonoscopy until 2026. Patient is requesting an urgent call back. Please advise, thank you.

## 2023-10-02 NOTE — Telephone Encounter (Signed)
Copied from CRM [id number]. Topic: Clinical - Red Word Triage >> Oct 02, 2023 10:40 AM Marica Otter wrote: [facility] that prompted transfer to Nurse Triage: Experiencing bleeding from rectum.   Chief Complaint: Rectal Bleeding Symptoms: rectal  Frequency: blood just today & over 2 months with loose stools--maybe almost 3 months Pertinent Negatives: Patient denies dizziness, being on blood thinners, constipation. Disposition: [] ED /[] Urgent Care (no appt availability in office) / [x] Appointment(In office/virtual)/ []  [facility]/ [] Home Care/ [] Refused Recommended Disposition /[] [facility]/ []  Follow-up with PCP Additional Notes: Patient called and advised that she had some bright red blood in her stools and has been having loose stools for about 2-3 months now that concerns her. Patient has been experiencing some loose bowels for over 2 months. She states that she has never had issues with her bowel movements. Patient denies any dizziness, being on blood thinners, or constipation. Patient states that she urinated and had a little bit of loose bowel movements and when she wiped she had bright red blood on the tissue at 1:30 am this morning.  She states that she had one other episode since then. Patient states that she has been having a lot of stress lately Patient states she has had a hemorrhoid a long time ago and it usually never gives her trouble, maybe a dot of blood once in a few years but that's about it.  Patient states that both of her parents died years ago of colon cancer.  She states that when she got over 83 years old she was told that her GI providers stopped doing them unless she has symptoms.  Patient is very anxious during triage and she states that she is upset that her GI doctors wanted to stop doing colonoscopies due to her age when she tries to be very proactive in her health. Appointment is made for today 10/02/2023 at 11:25 am with patient's PCP. Patient is  also advised that if anything worsens prior to this to go straight to the emergency room and call us back if she has any questions.  Patient verbalized understanding and was very appreciative of getting her in for an appointment so quickly.   Reason for Disposition  Age > 50 years  Answer Assessment - Initial Assessment Questions 1. APPEARANCE of BLOOD: "What color is it?" "Is it passed separately, on the surface of the stool, or mixed in with the stool?"      Bright red 2. AMOUNT: "How much blood was passed?"      Wiped and bright red blood on tissue--not actively pouring out but it covered the toilet paper when she wiped 3. FREQUENCY: "How many times has blood been passed with the stools?"      2 4. ONSET: "When was the blood first seen in the stools?" (Days or weeks)      1:30 am this morning 5. DIARRHEA: "Is there also some diarrhea?" If Yes, ask: "How many diarrhea stools in the past 24 hours?"      Loose stools 6. CONSTIPATION: "Do you have constipation?" If Yes, ask: "How bad is it?"     no 7. RECURRENT SYMPTOMS: "Have you had blood in your stools before?" If Yes, ask: "When was the last time?" and "What happened that time?"      Just today 8. BLOOD THINNERS: "Do you take any blood thinners?" (e.g., Coumadin/warfarin, Pradaxa/dabigatran, aspirin)     no 9. OTHER SYMPTOMS: "Do you have any other symptoms?"  (e.g.,  abdomen pain, vomiting, dizziness, fever)     Recent weight loss that patient has noticed, some loss of appetite,  Protocols used: Rectal Bleeding-A-AH

## 2023-10-02 NOTE — Patient Instructions (Addendum)
CT ordered   Keep appt with GI

## 2023-10-02 NOTE — Telephone Encounter (Signed)
Noted, patient scheduled for 1145 today.

## 2023-10-04 NOTE — Telephone Encounter (Signed)
Noted- looks like appt has been cancelled per pt

## 2023-10-04 NOTE — Telephone Encounter (Signed)
Inbound call from patient stating she does not wish to be seen on 2/24. Did not want to disclose further details.

## 2023-10-04 NOTE — Telephone Encounter (Signed)
Left message to return call to office.

## 2023-10-04 NOTE — Telephone Encounter (Signed)
Copied from CRM [id number]. Topic: Clinical - Lab/Test Results >> Oct 04, 2023 10:38 AM [name] wrote: Reason for CRM: Patient would like a call to discuss  what exactly sed rate is pertaining to the lab note from Dr Ruthine Dose.

## 2023-10-04 NOTE — Telephone Encounter (Signed)
Copied from CRM [id number]. Topic: Referral - Request for Referral >> Oct 04, 2023 10:35 AM Fredrich Romans wrote: Did the patient discuss referral with their provider in the last year? Yes (If No - schedule appointment) (If Yes - send message)  Appointment offered? No  Type of order/referral and detailed reason for visit: gastroenterology  Preference of office, provider, location: Dr Manuela Schwartz [facility] [phone] If referral order, have you been seen by this specialty before? No (If Yes, this issue or another issue? When? Where?  Can we respond through MyChart? No

## 2023-10-07 ENCOUNTER — Other Ambulatory Visit: Payer: Self-pay | Admitting: *Deleted

## 2023-10-07 DIAGNOSIS — Z1211 Encounter for screening for malignant neoplasm of colon: Secondary | ICD-10-CM

## 2023-10-07 DIAGNOSIS — Z85038 Personal history of other malignant neoplasm of large intestine: Secondary | ICD-10-CM

## 2023-10-07 NOTE — Telephone Encounter (Signed)
Patient contacted and given lab results and recommendations. Patient stated understanding and did not have any questions or concerns at this time.  Referral placed.

## 2023-10-10 NOTE — Telephone Encounter (Signed)
 Noted

## 2023-10-10 NOTE — Telephone Encounter (Signed)
Copied from CRM [id number]. Topic: General - Call Back - No Documentation >> Oct 10, 2023  1:51 PM Kathryne Eriksson wrote: Reason for CRM: Returning Office Call >> Oct 10, 2023  1:54 PM Kathryne Eriksson wrote: Patient states she's returning a missed office call from Dr. Myrtie Cruise nurse. She also states that she has a cat scan appointment scheduled for March 3rd, says that the information might be useful for whatever Dr.Kulik's nurse has been trying to reach her about.

## 2023-10-22 ENCOUNTER — Ambulatory Visit (HOSPITAL_BASED_OUTPATIENT_CLINIC_OR_DEPARTMENT_OTHER)
Admission: RE | Admit: 2023-10-22 | Discharge: 2023-10-22 | Disposition: A | Discharge status: Home / Self Care | Payer: PPO | Source: Ambulatory Visit | Attending: Family Medicine | Admitting: Family Medicine

## 2023-10-22 DIAGNOSIS — K5792 Diverticulitis of intestine, part unspecified, without perforation or abscess without bleeding: Secondary | ICD-10-CM | POA: Diagnosis not present

## 2023-10-22 DIAGNOSIS — R194 Change in bowel habit: Secondary | ICD-10-CM | POA: Insufficient documentation

## 2023-10-22 DIAGNOSIS — R1030 Lower abdominal pain, unspecified: Secondary | ICD-10-CM | POA: Insufficient documentation

## 2023-10-22 DIAGNOSIS — K625 Hemorrhage of anus and rectum: Secondary | ICD-10-CM | POA: Diagnosis not present

## 2023-10-22 DIAGNOSIS — K5730 Diverticulosis of large intestine without perforation or abscess without bleeding: Secondary | ICD-10-CM | POA: Diagnosis not present

## 2023-10-22 DIAGNOSIS — K449 Diaphragmatic hernia without obstruction or gangrene: Secondary | ICD-10-CM | POA: Diagnosis not present

## 2023-10-22 MED ORDER — IOHEXOL 300 MG/ML  SOLN
100.0000 mL | Freq: Once | INTRAMUSCULAR | Status: AC | PRN
  Administered 2023-10-22: 85 mL via INTRAVENOUS

## 2023-11-03 NOTE — Progress Notes (Signed)
 CT-nothing to explain pain.  Some esophageal possibly inflammation.  Has she seen GI?

## 2023-11-05 ENCOUNTER — Telehealth: Payer: Self-pay | Admitting: *Deleted

## 2023-11-05 NOTE — Telephone Encounter (Signed)
 Copied from CRM [id number]. Topic: Clinical - Lab/Test Results >> Nov 05, 2023  4:00 PM Theodis Sato wrote: Reason for CRM: Patient is requesting a phone call from Dr. Moshe Cipro nurse so that she can be updated of her CT results.  Patient contacted and given lab results and recommendations. Patient stated understanding and did not have any questions or concerns at this time.

## 2023-11-15 DIAGNOSIS — L03818 Cellulitis of other sites: Secondary | ICD-10-CM | POA: Diagnosis not present

## 2023-11-15 DIAGNOSIS — Z961 Presence of intraocular lens: Secondary | ICD-10-CM | POA: Diagnosis not present

## 2023-12-09 ENCOUNTER — Other Ambulatory Visit: Payer: Self-pay | Admitting: Family Medicine

## 2023-12-09 ENCOUNTER — Ambulatory Visit (INDEPENDENT_AMBULATORY_CARE_PROVIDER_SITE_OTHER)

## 2023-12-09 VITALS — Ht 69.0 in | Wt 197.0 lb

## 2023-12-09 DIAGNOSIS — Z0000 Encounter for general adult medical examination without abnormal findings: Secondary | ICD-10-CM | POA: Diagnosis not present

## 2023-12-09 MED ORDER — VALSARTAN 160 MG PO TABS: 160.0000 mg | ORAL_TABLET | Freq: Every day | ORAL | 1 refills | Status: DC

## 2023-12-09 NOTE — Progress Notes (Signed)
 Subjective:   [name] is a 83 y.o. who presents for a Medicare Wellness preventive visit.  Visit Complete: Virtual I connected with  [name] on 12/09/23 by a audio enabled telemedicine application and verified that I am speaking with the correct person using two identifiers.  Patient Location: Home  Provider Location: Office/Clinic  I discussed the limitations of evaluation and management by telemedicine. The patient expressed understanding and agreed to proceed.  Vital Signs: Because this visit was a virtual/telehealth visit, some criteria may be missing or patient reported. Any vitals not documented were not able to be obtained and vitals that have been documented are patient reported.  VideoDeclined- This patient declined Librarian, academic. Therefore the visit was completed with audio only.  Persons Participating in Visit: Patient.  AWV Questionnaire: No: Patient Medicare AWV questionnaire was not completed prior to this visit.  Cardiac Risk Factors include: advanced age (>65men, >48 women);hypertension     Objective:    Today's Vitals   12/09/23 1508  Weight: 197 lb (89.4 kg)  Height: 5\' 9"  (1.753 m)   Body mass index is 29.09 kg/m.     12/09/2023    3:14 PM 02/12/2023   12:11 AM 02/11/2023    2:12 AM 12/18/2022    3:11 PM 03/15/2022    3:25 PM 04/04/2021    8:06 AM 03/27/2021    2:21 PM  Advanced Directives  Does Patient Have a Medical Advance Directive? Yes Yes Yes Yes Yes Yes Yes  Type of Estate agent of [place];Living will Living will Living will Healthcare Power of [place];Living will Living will Healthcare Power of [place];Living will Healthcare Power of [place];Living will  Does patient want to make changes to medical advance directive?  No - Patient declined     No - Patient declined  Copy of Healthcare Power of Attorney in Chart? No - copy requested   No - copy requested   No - copy  requested    Current Medications (verified) Outpatient Encounter Medications as of 12/09/2023  Medication Sig   acetaminophen  (TYLENOL ) 500 MG tablet Take 500 mg by mouth every 6 (six) hours as needed for moderate pain.   Artificial Tear Ointment (DRY EYES OP) Apply 1 drop to eye as needed (Dry eye).   Calcium  Carbonate (CALCIUM  500 PO) Take 500 mg by mouth every other day.   Cholecalciferol (VITAMIN D3) 50 MCG (2000 UT) TABS Take 1 tablet by mouth daily.   clotrimazole-betamethasone  (LOTRISONE) cream 1 application to affected area Externally Twice a day for 14 days   Cyanocobalamin  (B-12 PO) Take 1 tablet by mouth daily.   hydrALAZINE  (APRESOLINE ) 50 MG tablet Take 1 tablet (50 mg total) by mouth in the morning and at bedtime. 12 HOURS APART   Magnesium 300 MG CAPS 1 capsule with a meal Orally Once a day   metoprolol  tartrate (LOPRESSOR ) 25 MG tablet TAKE 1 TABLET(25 MG) BY MOUTH TWICE DAILY AS NEEDED FOR PALPITATIONS (Patient taking differently: Take 12.5 mg by mouth 2 (two) times daily as needed (Palpitations).)   mupirocin ointment (BACTROBAN) 2 % SMARTSIG:1 Application Topical 2-3 Times Daily   Oyster Shell Calcium  500 MG TABS 1 tablet with meals Orally Twice a day for 30 day(s)   POTASSIUM PO 1 tablet Orally Once a day   PREVIDENT  5000 ENAMEL PROTECT 1.1-5 % GEL 1 Application by Other route at bedtime.   simvastatin  (ZOCOR ) 20 MG tablet TAKE 1 TABLET(20 MG) BY MOUTH AT  BEDTIME   valsartan  (DIOVAN ) 160 MG tablet Take 1 tablet (160 mg total) by mouth daily.   No facility-administered encounter medications on file as of 12/09/2023.    Allergies (verified) Clindamycin hcl, Lisinopril , Losartan  potassium, Metoprolol  tartrate, and Amoxicillin    History: Past Medical History:  Diagnosis Date   Anxiety    Arthritis    right thumb [facility] arthritis   Asthmatic bronchitis 2017   Cancer (HCC)    -NHL R parotid   Cataract    bilateral small- removed both eyes    Colon cancer (HCC)     stage 1- at appendiceal orifice    Complication of anesthesia    has dry mouth anyway from shograns-sore throats post op   Coronary artery calcification seen on CAT scan 11/14/2019   Depression    since 1982    Diverticulosis    Fibromyalgia    GERD (gastroesophageal reflux disease)    past hx    Heart murmur    Hyperlipidemia    controlled - metabolic syndrome per pt- goes up and down    Hypertension    Lung infiltrate on CT 09/14/2015   Meningioma (HCC) 11/2009   right post parietal 16mm - stable 11/2009 MRI   Nephrolithiasis    Nephrolithiasis    Neuromuscular disorder (HCC)    fibromyalgia   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   Pure hypercholesterolemia 11/14/2019   Sjogren's disease (HCC)    Past Surgical History:  Procedure Laterality Date   ABDOMINAL HYSTERECTOMY     APPENDECTOMY  2008   ARTERY BIOPSY  02/03/2013   Procedure: MINOR BIOPSY TEMPORAL ARTERY;  Surgeon: Lawence Press, MD;  Location: [facility];  Service: ENT;;   COLONOSCOPY     KIDNEY STONE SURGERY  1974   LEFT HEART CATH AND CORONARY ANGIOGRAPHY N/A 12/01/2019   Procedure: LEFT HEART CATH AND CORONARY ANGIOGRAPHY;  Surgeon: Millicent Ally, MD;  Location: [facility];  Service: Cardiovascular;  Laterality: N/A;   MASS EXCISION  2008   abd-benign   PAROTIDECTOMY Right 08/01/2015   Procedure: RIGHT PAROTIDECTOMY;  Surgeon: Reynold Caves, MD;  Location: [facility];  Service: ENT;  Laterality: Right;   PAROTIDECTOMY Left 04/04/2021   Procedure: LEFT PAROTIDECTOMY;  Surgeon: Reynold Caves, MD;  Location: [facility];  Service: ENT;  Laterality: Left;   POLYPECTOMY     TONSILLECTOMY     VAGINAL HYSTERECTOMY  1989   Family History  Problem Relation Age of Onset   Colon cancer Mother 15   Colon cancer Father        unknown when onset was   Suicidality Son    Diabetes Neg Hx    Stomach cancer Neg Hx    Colon polyps Neg Hx    Esophageal cancer  Neg Hx    Rectal cancer Neg Hx    Social History   Socioeconomic History   Marital status: Divorced    Spouse name: Not on file   Number of children: 2   Years of education: College   Highest education level: Not on file  Occupational History   Occupation: Retired    Associate Professor: UNEMPLOYED  Tobacco Use   Smoking status: Never   Smokeless tobacco: Never   Tobacco comments:    Passive exposure through her mother.  Vaping Use   Vaping status: Never Used  Substance and Sexual Activity   Alcohol use: Not Currently    Comment:  Rarely   Drug use: No   Sexual activity: Not Currently    Comment: MPOA Kendra Pavy sister.  Other Topics Concern   Not on file  Social History Narrative   Pt lives at home alone.   Caffeine Use: 2 mugs every other day.        Pulmonary:   Originally from [place], [place]. She has also lived in [place], [place], & [place] . Internationally she has been to [place], [place], [place], [place], & [place]. Previously has worked in Airline pilot. No chemical or fume exposures. No pets currently. No bird, mold, or hot tub exposure. 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    Social Drivers of Corporate investment banker Strain: Low Risk  (12/18/2022)   Overall Financial Resource Strain (CARDIA)    Difficulty of Paying Living Expenses: Not hard at all  Food Insecurity: No Food Insecurity (02/11/2023)   Hunger Vital Sign    Worried About Running Out of Food in the Last Year: Never true    Ran Out of Food in the Last Year: Never true  Transportation Needs: No Transportation Needs (02/12/2023)   PRAPARE - Administrator, Civil Service (Medical): No    Lack of Transportation (Non-Medical): No  Physical Activity: Inactive (12/09/2023)   Exercise Vital Sign    Days of Exercise per Week: 0 days    Minutes of Exercise per Session: 0 min  Stress: Stress Concern Present (12/18/2022)   [employer] of Occupational  Health - Occupational Stress Questionnaire    Feeling of Stress : To some extent  Social Connections: Socially Isolated (12/18/2022)   Social Connection and Isolation Panel [NHANES]    Frequency of Communication with Friends and Family: More than three times a week    Frequency of Social Gatherings with Friends and Family: More than three times a week    Attends Religious Services: Never    Database administrator or Organizations: No    Attends Banker Meetings: Never    Marital Status: Divorced    Tobacco Counseling Counseling given: Not Answered Tobacco comments: Passive exposure through her mother.    Clinical Intake:  Pre-visit preparation completed: Yes  Pain : No/denies pain     BMI - recorded: 29.09 Nutritional Status: BMI 25 -29 Overweight Nutritional Risks: None Diabetes: No  Lab Results  Component Value Date   HGBA1C 5.9 05/01/2023   HGBA1C 5.9 12/05/2022   HGBA1C 6.3 08/24/2021     How often do you need to have someone help you when you read instructions, pamphlets, or other written materials from your doctor or pharmacy?: 1 - Never  Interpreter Needed?: No  Information entered by :: Lamont Pilsner, LPN   Activities of Daily Living     12/09/2023    3:10 PM 02/12/2023   12:10 AM  In your present state of health, do you have any difficulty performing the following activities:  Hearing? 0 0  Vision? 0 0  Difficulty concentrating or making decisions? 0 0  Walking or climbing stairs? 0 0  Dressing or bathing? 0 0  Doing errands, shopping? 0 0  Preparing Food and eating ? N   Using the Toilet? N   In the past six months, have you accidently leaked urine? N   Do you have problems with loss of bowel control? N   Managing your Medications? N   Managing your Finances? N   Housekeeping or managing  your Housekeeping? N     Patient Care Team: Christel Cousins, MD as PCP - General (Family Medicine) Maudine Sos, MD as PCP - Cardiology  (Cardiology) Reynold Caves, MD as Consulting Physician (Otolaryngology) Tessa Figures, MD as Consulting Physician (Gastroenterology) Almeda Jacobs, MD as Consulting Physician (Hematology and Oncology)  Indicate any recent Medical Services you may have received from other than [facility] providers in the past year (date may be approximate).     Assessment:   This is a routine wellness examination for [facility].  Hearing/Vision screen Hearing Screening - Comments:: Pt denies any hearing issues  Vision Screening - Comments:: Wears rx glasses - up to date with routine eye exams with DR McCuen     Goals Addressed             This Visit's Progress    Patient Stated       Maintain health and activity        Depression Screen     12/09/2023    3:18 PM 08/07/2023    9:04 AM 12/18/2022    3:07 PM 09/28/2022    4:17 PM 06/04/2022   11:09 AM 06/04/2022   11:08 AM 03/15/2022    3:23 PM  PHQ 2/9 Scores  PHQ - 2 Score 2 5 4 4  0 6 2  PHQ- 9 Score 2 12 9 9   8     Fall Risk     12/09/2023    3:15 PM 08/07/2023    9:03 AM 12/18/2022    3:11 PM 09/28/2022    4:16 PM 06/04/2022   11:09 AM  Fall Risk   Falls in the past year? 0 0 0 0 0  Number falls in past yr: 0 0 0 0 0  Injury with Fall? 0 0 0 0 0  Risk for fall due to : No Fall Risks No Fall Risks Impaired vision No Fall Risks   Follow up Falls prevention discussed Falls evaluation completed Falls prevention discussed Falls evaluation completed     MEDICARE RISK AT HOME:  Medicare Risk at Home Any stairs in or around the home?: No If so, are there any without handrails?: No Home free of loose throw rugs in walkways, pet beds, electrical cords, etc?: Yes Adequate lighting in your home to reduce risk of falls?: Yes Life alert?: No Use of a cane, walker or w/c?: No Grab bars in the bathroom?: No Shower chair or bench in shower?: No Elevated toilet seat or a handicapped toilet?: No  TIMED UP AND GO:  Was the test performed?   No  Cognitive Function: 6CIT completed        12/09/2023    3:16 PM 12/18/2022    3:12 PM 03/15/2022    3:27 PM  6CIT Screen  What Year? 0 points 0 points 0 points  What month? 0 points 0 points 0 points  What time? 0 points 0 points 0 points  Count back from 20 0 points 0 points 0 points  Months in reverse 0 points 0 points 0 points  Repeat phrase 0 points 0 points 0 points  Total Score 0 points 0 points 0 points    Immunizations Immunization History  Administered Date(s) Administered   Fluzone Influenza virus vaccine,trivalent (IIV3), split virus 04/28/2014   Influenza Split 05/15/2012, 05/21/2015   Influenza Whole 05/21/2007, 05/19/2008, 06/01/2010   Influenza, High Dose Seasonal PF 05/09/2021, 05/14/2023   Influenza,inj,Quad PF,6+ Mos 05/01/2016, 05/10/2017, 05/13/2018, 05/07/2019, 05/12/2020   Influenza,inj,quad, With Preservative  06/14/2015   Influenza-Unspecified 05/24/2022   PFIZER(Purple Top)SARS-COV-2 Vaccination 10/03/2019, 10/25/2019, 06/28/2020, 11/18/2020, 05/05/2021   PNEUMOCOCCAL CONJUGATE-20 12/05/2021   Pfizer Covid-19 Vaccine Bivalent Booster 35yrs & up 05/05/2021   Pfizer(Comirnaty)Fall Seasonal Vaccine 12 years and older 05/24/2022, 05/14/2023   Pneumococcal Conjugate-13 03/01/2015   Pneumococcal Polysaccharide-23 08/20/1998, 02/22/1999   Respiratory Syncytial Virus Vaccine,Recomb Aduvanted(Arexvy) 06/28/2022   Td 04/29/2006   Tdap 12/19/2022   Zoster Recombinant(Shingrix) 06/14/2020, 12/05/2021   Zoster, Live 06/23/2007, 04/13/2020   Zoster, Unspecified 04/13/2000    Screening Tests Health Maintenance  Topic Date Due   Colonoscopy  03/05/2023   HEMOGLOBIN A1C  10/29/2023   COVID-19 Vaccine (8 - Pfizer risk 2024-25 season) 11/11/2023   Diabetic kidney evaluation - Urine ACR  06/05/2027 (Originally 01/05/1959)   MAMMOGRAM  03/10/2024   INFLUENZA VACCINE  03/20/2024   Diabetic kidney evaluation - eGFR measurement  10/01/2024   Medicare Annual  Wellness (AWV)  12/08/2024   DTaP/Tdap/Td (3 - Td or Tdap) 12/18/2032   Pneumonia Vaccine 43+ Years old  Completed   DEXA SCAN  Completed   Zoster Vaccines- Shingrix  Completed   HPV VACCINES  Aged Out   Meningococcal B Vaccine  Aged Out   FOOT EXAM  Discontinued   OPHTHALMOLOGY EXAM  Discontinued    Health Maintenance  Health Maintenance Due  Topic Date Due   Colonoscopy  03/05/2023   HEMOGLOBIN A1C  10/29/2023   COVID-19 Vaccine (8 - Pfizer risk 2024-25 season) 11/11/2023   Health Maintenance Items Addressed: See Nurse Notes  Additional Screening:  Vision Screening: Recommended annual ophthalmology exams for early detection of glaucoma and other disorders of the eye.  Dental Screening: Recommended annual dental exams for proper oral hygiene  Community Resource Referral / Chronic Care Management: CRR required this visit?  No   CCM required this visit?  No     Plan:     I have personally reviewed and noted the following in the patient's chart:   Medical and social history Use of alcohol, tobacco or illicit drugs  Current medications and supplements including opioid prescriptions. Patient is not currently taking opioid prescriptions. Functional ability and status Nutritional status Physical activity Advanced directives List of other physicians Hospitalizations, surgeries, and ER visits in previous 12 months Vitals Screenings to include cognitive, depression, and falls Referrals and appointments  In addition, I have reviewed and discussed with patient certain preventive protocols, quality metrics, and best practice recommendations. A written personalized care plan for preventive services as well as general preventive health recommendations were provided to patient.     Bruno Capri, LPN   11/26/8117   After Visit Summary: (Declined) Due to this being a telephonic visit, with patients personalized plan was offered to patient but patient Declined AVS at this  time   Notes: Nothing significant to report at this time.

## 2023-12-09 NOTE — Telephone Encounter (Signed)
 Copied from CRM [id number]. Topic: Clinical - Medication Refill >> Dec 09, 2023 10:33 AM Varney Gentleman wrote: Most Recent Primary Care Visit:  Provider: Glenetta Lane MARIE  Department: [facility]  Visit Type: ACUTE  Date: 10/02/2023  Medication: valsartan  (DIOVAN ) 160 MG tablet  Has the patient contacted their pharmacy? No, new pharmacy (Agent: If no, request that the patient contact the pharmacy for the refill. If patient does not wish to contact the pharmacy document the reason why and proceed with request.) (Agent: If yes, when and what did the pharmacy advise?)  Is this the correct pharmacy for this prescription? Yes If no, delete pharmacy and type the correct one.  This is the patient's preferred pharmacy:  [facility] [id number] [place], [place] - [address] [place] [address] Phone: [phone] Fax: [phone]   Has the prescription been filled recently? No  Is the patient out of the medication? Yes  Has the patient been seen for an appointment in the last year OR does the patient have an upcoming appointment? Yes  Can we respond through MyChart? No  Agent: Please be advised that Rx refills may take up to 3 business days. We ask that you follow-up with your pharmacy.

## 2023-12-09 NOTE — Patient Instructions (Signed)
 [name] , Thank you for taking time to come for your Medicare Wellness Visit. I appreciate your ongoing commitment to your health goals. Please review the following plan we discussed and let me know if I can assist you in the future.   Referrals/Orders/Follow-Ups/Clinician Recommendations: Each day, aim for 6 glasses of water, plenty of protein in your diet and try to get up and walk/ stretch every hour for 5-10 minutes at a time.    This is a list of the screening recommended for you and due dates:  Health Maintenance  Topic Date Due   Colon Cancer Screening  03/05/2023   Hemoglobin A1C  10/29/2023   COVID-19 Vaccine (8 - Pfizer risk 2024-25 season) 11/11/2023   Yearly kidney health urinalysis for diabetes  06/05/2027*   Mammogram  03/10/2024   Flu Shot  03/20/2024   Yearly kidney function blood test for diabetes  10/01/2024   Medicare Annual Wellness Visit  12/08/2024   DTaP/Tdap/Td vaccine (3 - Td or Tdap) 12/18/2032   Pneumonia Vaccine  Completed   DEXA scan (bone density measurement)  Completed   Zoster (Shingles) Vaccine  Completed   HPV Vaccine  Aged Out   Meningitis B Vaccine  Aged Out   Complete foot exam   Discontinued   Eye exam for diabetics  Discontinued  *Topic was postponed. The date shown is not the original due date.    Advanced directives: (Copy Requested) Please bring a copy of your health care power of attorney and living will to the office to be added to your chart at your convenience. You can mail to [facility] [address] [place], [place] [address] or email to [email or web address]  Next Medicare Annual Wellness Visit scheduled for next year: Yes

## 2024-02-06 ENCOUNTER — Ambulatory Visit: Payer: Self-pay | Admitting: Family Medicine

## 2024-02-06 ENCOUNTER — Telehealth: Payer: Self-pay | Admitting: *Deleted

## 2024-02-06 ENCOUNTER — Ambulatory Visit (INDEPENDENT_AMBULATORY_CARE_PROVIDER_SITE_OTHER): Payer: PPO | Admitting: Family Medicine

## 2024-02-06 ENCOUNTER — Encounter: Payer: Self-pay | Admitting: Family Medicine

## 2024-02-06 VITALS — BP 142/58 | HR 85 | Temp 97.5°F | Resp 18 | Ht 69.0 in | Wt 200.0 lb

## 2024-02-06 DIAGNOSIS — D539 Nutritional anemia, unspecified: Secondary | ICD-10-CM

## 2024-02-06 DIAGNOSIS — E7800 Pure hypercholesterolemia, unspecified: Secondary | ICD-10-CM | POA: Diagnosis not present

## 2024-02-06 DIAGNOSIS — E538 Deficiency of other specified B group vitamins: Secondary | ICD-10-CM | POA: Diagnosis not present

## 2024-02-06 DIAGNOSIS — R002 Palpitations: Secondary | ICD-10-CM

## 2024-02-06 DIAGNOSIS — I10 Essential (primary) hypertension: Secondary | ICD-10-CM | POA: Diagnosis not present

## 2024-02-06 DIAGNOSIS — R7303 Prediabetes: Secondary | ICD-10-CM

## 2024-02-06 DIAGNOSIS — K529 Noninfective gastroenteritis and colitis, unspecified: Secondary | ICD-10-CM | POA: Diagnosis not present

## 2024-02-06 DIAGNOSIS — E782 Mixed hyperlipidemia: Secondary | ICD-10-CM

## 2024-02-06 LAB — COMPREHENSIVE METABOLIC PANEL WITH GFR
ALT: 10 U/L (ref 0–35)
AST: 16 U/L (ref 0–37)
Albumin: 4.2 g/dL (ref 3.5–5.2)
Alkaline Phosphatase: 75 U/L (ref 39–117)
BUN: 14 mg/dL (ref 6–23)
CO2: 26 meq/L (ref 19–32)
Calcium: 9.5 mg/dL (ref 8.4–10.5)
Chloride: 106 meq/L (ref 96–112)
Creatinine, Ser: 0.6 mg/dL (ref 0.40–1.20)
GFR: 83.2 mL/min (ref >60.00)
Glucose, Bld: 92 mg/dL (ref 70–99)
Potassium: 4.3 meq/L (ref 3.5–5.1)
Sodium: 138 meq/L (ref 135–145)
Total Bilirubin: 0.5 mg/dL (ref 0.2–1.2)
Total Protein: 7.5 g/dL (ref 6.0–8.3)

## 2024-02-06 LAB — IBC + FERRITIN
Ferritin: 46.4 ng/mL (ref 10.0–291.0)
Iron: 148 ug/dL — ABNORMAL HIGH (ref 42–145)
Saturation Ratios: 43 % (ref 20.0–50.0)
TIBC: 344.4 ug/dL (ref 250.0–450.0)
Transferrin: 246 mg/dL (ref 212.0–360.0)

## 2024-02-06 LAB — CBC WITH DIFFERENTIAL/PLATELET
Basophils Absolute: 0 10*3/uL (ref 0.0–0.1)
Basophils Relative: 0.7 % (ref 0.0–3.0)
Eosinophils Absolute: 0.2 10*3/uL (ref 0.0–0.7)
Eosinophils Relative: 3.6 % (ref 0.0–5.0)
HCT: 35.5 % — ABNORMAL LOW (ref 36.0–46.0)
Hemoglobin: 11.9 g/dL — ABNORMAL LOW (ref 12.0–15.0)
Lymphocytes Relative: 16.2 % (ref 12.0–46.0)
Lymphs Abs: 1 10*3/uL (ref 0.7–4.0)
MCHC: 33.6 g/dL (ref 30.0–36.0)
MCV: 88.2 fl (ref 78.0–100.0)
Monocytes Absolute: 0.6 10*3/uL (ref 0.1–1.0)
Monocytes Relative: 9.9 % (ref 3.0–12.0)
Neutro Abs: 4.4 10*3/uL (ref 1.4–7.7)
Neutrophils Relative %: 69.6 % (ref 43.0–77.0)
Platelets: 215 10*3/uL (ref 150.0–400.0)
RBC: 4.03 Mil/uL (ref 3.87–5.11)
RDW: 13.4 % (ref 11.5–15.5)
WBC: 6.3 10*3/uL (ref 4.0–10.5)

## 2024-02-06 LAB — LIPID PANEL
Cholesterol: 181 mg/dL (ref 0–200)
HDL: 51.5 mg/dL (ref >39.00)
LDL Cholesterol: 99 mg/dL (ref 0–99)
NonHDL: 129.42
Total CHOL/HDL Ratio: 4
Triglycerides: 153 mg/dL — ABNORMAL HIGH (ref 0.0–149.0)
VLDL: 30.6 mg/dL (ref 0.0–40.0)

## 2024-02-06 LAB — MAGNESIUM: Magnesium: 2.3 mg/dL (ref 1.5–2.5)

## 2024-02-06 LAB — HEMOGLOBIN A1C: Hgb A1c MFr Bld: 6 % (ref 4.6–6.5)

## 2024-02-06 LAB — VITAMIN B12: Vitamin B-12: 354 pg/mL (ref 211–911)

## 2024-02-06 MED ORDER — HYDRALAZINE HCL 25 MG PO TABS: 25.0000 mg | ORAL_TABLET | Freq: Two times a day (BID) | ORAL | 0 refills | Status: DC

## 2024-02-06 MED ORDER — METOPROLOL TARTRATE 25 MG PO TABS: 12.5000 mg | ORAL_TABLET | Freq: Two times a day (BID) | ORAL | 3 refills | Status: DC

## 2024-02-06 NOTE — Progress Notes (Signed)
 Labs ok except  Slight anemia-has had for awhile-needs to get sch wGI and keep follow up w/oncology A1C(3 month average of sugars) is elevated.  This is considered PreDiabetes.  Work on diet-decrease sugars and starches and aim for 30 minutes of exercise 5 days/week to prevent progression to diabetes  B12 has decreased some-make sure still taking it

## 2024-02-06 NOTE — Progress Notes (Signed)
 Subjective:     Patient ID: [name], female    DOB: [birth year], 83 y.o.   MRN: [id number]  Chief Complaint  Patient presents with   Medical Management of Chronic Issues    6 month follow-up     HPI Hhpalp Discussed the use of AI scribe software for clinical note transcription with the patient, who gave verbal consent to proceed.  History of Present Illness [name] is an 83 year old female with hypertension, hyperlipidemia, and palpitations who presents for follow-up.  She experiences severe pruritus all over her body, which has intensified recently, disrupting her sleep and causing excoriations and bleeding from scratching. She suspects hydralazine  as a potential cause. She takes cetirizine at night to manage the itching but dislikes its effects the following day of drowsiness  She has experienced a return of heart palpitations, described as 'pounding in my chest,' similar to what she experienced before starting blood pressure medication. The palpitations have been more frequent over the past few weeks, with a particularly severe episode occurring on a recent Tuesday morning, lasting most of the morning. She has been taking metoprolol  infrequently, which slows the pounding but does not stop it completely. No associated chest pain, shortness of breath, or dizziness.  She describes new symptoms in her legs, including a sensation of heat rising up her legs, tingling, and shooting pains, which she associates with peripheral neuropathy. These symptoms have been increasing in frequency and intensity. She has a history of autoimmune issues and mentions that her feet have always been cold. Concerned coming from hydralazine   She reports a recent episode of diarrhea lasting several weeks, which has since resolved without dietary changes. She was concerned about colon cancer due to the change in her bowel habits but notes that the diarrhea has stopped. Hasn't sch w/new GI  yet  She mentions experiencing strong pain in her right shoulder, which started about two weeks ago. The pain is intermittent and she is unsure of its exact location, describing it as 'really, really painful' at times. Hard to move it at times  She is currently taking hydralazine  twice a day, valsartan  160 mg twice daily, and simvastatin  20 mg daily. She has not been checking her blood pressure at home and expresses uncertainty about the accuracy of a recent reading.  Predm-no specific diet/exercise   Health Maintenance Due  Topic Date Due   Colonoscopy  03/05/2023   HEMOGLOBIN A1C  10/29/2023    Past Medical History:  Diagnosis Date   Anxiety    Arthritis    right thumb CMC arthritis   Asthmatic bronchitis 2017   Cancer (HCC)    -NHL R parotid   Cataract    bilateral small- removed both eyes    Colon cancer (HCC)    stage 1- at appendiceal orifice    Complication of anesthesia    has dry mouth anyway from shograns-sore throats post op   Coronary artery calcification seen on CAT scan 11/14/2019   Depression    since 1982    Diverticulosis    Fibromyalgia    GERD (gastroesophageal reflux disease)    past hx    Heart murmur    Hyperlipidemia    controlled - metabolic syndrome per pt- goes up and down    Hypertension    Lung infiltrate on CT 09/14/2015   Meningioma (HCC) 11/2009   right post parietal 16mm - stable 11/2009 MRI   Nephrolithiasis    Nephrolithiasis  Neuromuscular disorder (HCC)    fibromyalgia   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   Pure hypercholesterolemia 11/14/2019   Sjogren's disease (HCC)     Past Surgical History:  Procedure Laterality Date   ABDOMINAL HYSTERECTOMY     APPENDECTOMY  2008   ARTERY BIOPSY  02/03/2013   Procedure: MINOR BIOPSY TEMPORAL ARTERY;  Surgeon: Lawence Press, MD;  Location: [facility];  Service: ENT;;   COLONOSCOPY     KIDNEY STONE SURGERY  1974   LEFT HEART CATH AND CORONARY  ANGIOGRAPHY N/A 12/01/2019   Procedure: LEFT HEART CATH AND CORONARY ANGIOGRAPHY;  Surgeon: Millicent Ally, MD;  Location: [facility];  Service: Cardiovascular;  Laterality: N/A;   MASS EXCISION  2008   abd-benign   PAROTIDECTOMY Right 08/01/2015   Procedure: RIGHT PAROTIDECTOMY;  Surgeon: Reynold Caves, MD;  Location: [facility];  Service: ENT;  Laterality: Right;   PAROTIDECTOMY Left 04/04/2021   Procedure: LEFT PAROTIDECTOMY;  Surgeon: Reynold Caves, MD;  Location: [facility];  Service: ENT;  Laterality: Left;   POLYPECTOMY     TONSILLECTOMY     VAGINAL HYSTERECTOMY  1989     Current Outpatient Medications:    acetaminophen  (TYLENOL ) 500 MG tablet, Take 500 mg by mouth every 6 (six) hours as needed for moderate pain., Disp: , Rfl:    Artificial Tear Ointment (DRY EYES OP), Apply 1 drop to eye as needed (Dry eye)., Disp: , Rfl:    Calcium  Carbonate (CALCIUM  500 PO), Take 500 mg by mouth every other day., Disp: , Rfl:    Cholecalciferol (VITAMIN D3) 50 MCG (2000 UT) TABS, Take 1 tablet by mouth daily., Disp: , Rfl:    clotrimazole-betamethasone  (LOTRISONE) cream, 1 application to affected area Externally Twice a day for 14 days, Disp: , Rfl:    Cyanocobalamin  (B-12 PO), Take 1 tablet by mouth daily., Disp: , Rfl:    Magnesium 300 MG CAPS, 1 capsule with a meal Orally Once a day, Disp: , Rfl:    mupirocin ointment (BACTROBAN) 2 %, SMARTSIG:1 Application Topical 2-3 Times Daily, Disp: , Rfl:    Oyster Shell Calcium  500 MG TABS, 1 tablet with meals Orally Twice a day for 30 day(s), Disp: , Rfl:    POTASSIUM PO, 1 tablet Orally Once a day, Disp: , Rfl:    PREVIDENT  5000 ENAMEL PROTECT 1.1-5 % GEL, 1 Application by Other route at bedtime., Disp: , Rfl:    simvastatin  (ZOCOR ) 20 MG tablet, TAKE 1 TABLET(20 MG) BY MOUTH AT BEDTIME, Disp: 90 tablet, Rfl: 3   valsartan  (DIOVAN ) 160 MG tablet, Take 1 tablet (160 mg total) by mouth daily., Disp: 90 tablet, Rfl: 1    hydrALAZINE  (APRESOLINE ) 25 MG tablet, Take 1 tablet (25 mg total) by mouth in the morning and at bedtime. 12 HOURS APART, Disp: 30 tablet, Rfl: 0   metoprolol  tartrate (LOPRESSOR ) 25 MG tablet, Take 0.5 tablets (12.5 mg total) by mouth 2 (two) times daily. TAKE 1 TABLET(25 MG) BY MOUTH TWICE DAILY AS NEEDED FOR PALPITATIONS, Disp: 180 tablet, Rfl: 3  Allergies  Allergen Reactions   Clindamycin Hcl     Other reaction(s): Diarrhea, rash  Other Reaction(s): Diarrhea, rash   Lisinopril     Losartan  Potassium     Other reaction(s): somnolence  Other Reaction(s): somnolence   Metoprolol  Tartrate     Other reaction(s): Diarrhea  Other Reaction(s): Diarrhea   Amoxicillin  Rash  Other reaction(s): rash   ROS neg/noncontributory except as noted HPI/below      Objective:     BP (!) 142/58 (BP Location: Left Arm, Patient Position: Sitting, Cuff Size: Normal)   Pulse 85   Temp (!) 97.5 F (36.4 C) (Temporal)   Resp 18   Ht 5' 9 (1.753 m)   Wt 200 lb (90.7 kg)   SpO2 94%   BMI 29.53 kg/m  Wt Readings from Last 3 Encounters:  02/06/24 200 lb (90.7 kg)  12/09/23 197 lb (89.4 kg)  10/02/23 197 lb 4 oz (89.5 kg)    Physical Exam   Gen: WDWN NAD HEENT: NCAT, conjunctiva not injected, sclera nonicteric NECK:  supple, no thyromegaly, no nodes, no carotid bruits CARDIAC: RRR, S1S2+, 2/6 murmur. DP 2+B LUNGS: CTAB. No wheezes ABDOMEN:  BS+, soft, NTND, No HSM, no masses EXT:  no edema MSK: no gross abnormalities.  NEURO: A&O x3.  CN II-XII intact.  PSYCH: normal mood. Good eye contact     Assessment & Plan:  Essential hypertension -     CBC with Differential/Platelet -     Comprehensive metabolic panel with GFR -     Magnesium -     hydrALAZINE  HCl; Take 1 tablet (25 mg total) by mouth in the morning and at bedtime. 12 HOURS APART  Dispense: 30 tablet; Refill: 0  Mixed hyperlipidemia -     Comprehensive metabolic panel with GFR -     Lipid panel  Chronic  diarrhea  Deficiency anemia -     CBC with Differential/Platelet -     Vitamin B12 -     IBC + Ferritin  Prediabetes -     Hemoglobin A1c -     Comprehensive metabolic panel with GFR  Pure hypercholesterolemia  Vitamin B12 deficiency -     Vitamin B12  Palpitations -     Metoprolol  Tartrate; Take 0.5 tablets (12.5 mg total) by mouth 2 (two) times daily. TAKE 1 TABLET(25 MG) BY MOUTH TWICE DAILY AS NEEDED FOR PALPITATIONS  Dispense: 180 tablet; Refill: 3  Assessment and Plan Assessment & Plan Hypertension   Hypertension management is complicated by recurrent palpitations and a pounding heart. Home blood pressure monitoring is not being conducted, with a recent in-office reading of 142/60 mmHg. Current medications include valsartan  and hydralazine , with hydralazine  suspected of causing itching and possibly peripheral neuropathy. Metoprolol  is being considered to manage palpitations and blood pressure. Hydralazine  will be weaned off over six days due to potential side effects-25mg  bid for 3 days, then daily for 3 days, then stop, and metoprolol  12.5mg  bid(so 1/2 of 25)will be initiated at half a tablet twice a day. Valsartan  160 mg daily will continue. Blood pressure should be monitored at home, and an event monitor may be considered if symptoms persist.  Pruritus   Chronic itching without rash, possibly related to hydralazine , is causing significant sleep disturbance. Previous cetirizine use provided some relief but was not well tolerated. Hydralazine  will be discontinued to assess its contribution to itching before considering additional medications. Alternative antihistamines may be considered if symptoms persist post-discontinuation. Also, has sjogrens so ideally, get off hydralazine   Peripheral Neuropathy   Symptoms of tingling, warmth, and shooting pains in the legs may be related to hydralazine  or autoimmune conditions such as Sjogren's syndrome. Hydralazine  will be discontinued to  determine if symptoms improve. A specialist referral will be considered if symptoms persist.  Hyperlipidemia   Hyperlipidemia is managed with simvastatin  20mg  which will  continue due to risk factors despite her inquiry about its necessity at her age.  Vitamin B12 Deficiency   She is not taking B12 regularly despite low levels and takes calcium  and magnesium intermittently. Regular monitoring of B12 levels is necessary to ensure adequate supplementation. Blood work will check B12 levels, and regular supplementation will be encouraged if levels are low.  Follow-up   Follow-up is necessary to assess the effectiveness of medication changes and symptom resolution. She has opted to wait on using a heart monitor to assess palpitations until after medication adjustments. A follow-up is scheduled in two weeks to assess blood pressure control and symptom resolution. Prescriptions will be sent to [facility].    Return in about 2 weeks (around 02/20/2024) for chronic follow-up, HTN.  Ellsworth Haas, MD

## 2024-02-06 NOTE — Telephone Encounter (Signed)
 Copied from CRM [id number]. Topic: Clinical - Medication Question >> Feb 06, 2024 11:57 AM Marlan Silva wrote: Reason for CRM: Patient called in stating that she wanted to tell Dr. Waldo Guitar that she does not need the rx for hydroxyzine and that she can cut her current medication in half. Patient states she has enough of the medication to cut in half. Patient states that a call back is not needed.

## 2024-02-06 NOTE — Patient Instructions (Addendum)
 It was very nice to see you today!  Hydralazine  25 mg twice daily for 3 days then once/day for 3 days, then stop Metoprolol  1/2 tab twice/day starting today  [facility]  [address] on the 1st floor Phone number [phone]    PLEASE NOTE:  If you had any lab tests please let us  know if you have not heard back within a few days. You may see your results on MyChart before we have a chance to review them but we will give you a call once they are reviewed by us . If we ordered any referrals today, please let us  know if you have not heard from their office within the next week.   Please try these tips to maintain a healthy lifestyle:  Eat most of your calories during the day when you are active. Eliminate processed foods including packaged sweets (pies, cakes, cookies), reduce intake of potatoes, white bread, white pasta, and white rice. Look for whole grain options, oat flour or almond flour.  Each meal should contain half fruits/vegetables, one quarter protein, and one quarter carbs (no bigger than a computer mouse).  Cut down on sweet beverages. This includes juice, soda, and sweet tea. Also watch fruit intake, though this is a healthier sweet option, it still contains natural sugar! Limit to 3 servings daily.  Drink at least 1 glass of water with each meal and aim for at least 8 glasses per day  Exercise at least 150 minutes every week.

## 2024-02-07 ENCOUNTER — Telehealth: Payer: Self-pay | Admitting: *Deleted

## 2024-02-07 NOTE — Telephone Encounter (Signed)
 Copied from CRM [id number]. Topic: Clinical - Lab/Test Results >> Feb 07, 2024  1:55 PM Adonis Hoot wrote: Reason for CRM: Patient returned call regarding lab results. I relayed message to patient and she has additional questions.She is requesting a phone call .  Left message for patient to return my call.

## 2024-02-07 NOTE — Telephone Encounter (Signed)
 Spoke with patient and she stated she did not understand what GI meant. Explained to patient what doctor that was and patient verbalized understanding.

## 2024-02-25 ENCOUNTER — Encounter: Payer: Self-pay | Admitting: Family Medicine

## 2024-02-25 ENCOUNTER — Ambulatory Visit (INDEPENDENT_AMBULATORY_CARE_PROVIDER_SITE_OTHER): Admitting: Family Medicine

## 2024-02-25 VITALS — BP 133/76 | HR 73 | Temp 97.1°F | Resp 18 | Ht 69.0 in | Wt 199.2 lb

## 2024-02-25 DIAGNOSIS — I10 Essential (primary) hypertension: Secondary | ICD-10-CM | POA: Diagnosis not present

## 2024-02-25 DIAGNOSIS — L299 Pruritus, unspecified: Secondary | ICD-10-CM

## 2024-02-25 DIAGNOSIS — F4321 Adjustment disorder with depressed mood: Secondary | ICD-10-CM | POA: Diagnosis not present

## 2024-02-25 MED ORDER — GABAPENTIN 100 MG PO CAPS: 100.0000 mg | ORAL_CAPSULE | Freq: Every day | ORAL | 1 refills | Status: DC

## 2024-02-25 NOTE — Patient Instructions (Addendum)
 Watch blood pressures.  If going up to >140/90, then restart the valsartan  160mg  daily Use the metoprolol  if heart pounding  Gabapentin  at bed-may help sleep and itch.   Get blood pressure cuff.

## 2024-02-25 NOTE — Progress Notes (Signed)
 Subjective:     Patient ID: [name], female    DOB: [birth year], 83 y.o.   MRN: [id number]  Chief Complaint  Patient presents with   Follow-up    2 week follow-up on htn     HPI Discussed the use of AI scribe software for clinical note transcription with the patient, who gave verbal consent to proceed.  History of Present Illness [name] is an 83 year old female with hypertension who presents with medication management issues and depression.  She is experiencing issues with her blood pressure medication regimen. She has stopped taking hydralazine  and is not currently taking valsartan  nor metoprolol  as prescribed. She mentions confusion about her medication instructions and has not experienced any heart palpitations since stopping the medications. She has been cutting valsartan  pills in half, but they crumble, making it difficult to take them properly.  She describes a long-standing issue with itching, which has persisted for years and affects her sleep. The itching is severe enough to cause her to use a back scratcher frequently, and it occurs all over her body, including her face and scalp. She has not found relief from previous treatments.  She is experiencing significant depression, describing herself as 'worn out, physically and mentally.' She feels a lack of motivation and energy, wanting to 'stay in bed all the time.' She has not been engaging in her usual activities, such as art projects, which she previously enjoyed. She attributes some of her emotional distress to family issues, particularly concerning her sister's declining condition and her nephew's lack of involvement. No suicidal thoughts, citing her grandson and great-grandson as reasons to continue. She also mentions a history of volunteering and interest in mental health, but currently feels unable to participate in these activities due to her depression. No SI  She lives alone and has a history of  being active in volunteering and art. She is currently struggling with household tasks and feels overwhelmed by responsibilities. No chest pain, shortness of breath, or suicidal thoughts. Reports significant fatigue and lack of motivation.    Health Maintenance Due  Topic Date Due   Colonoscopy  03/05/2023    Past Medical History:  Diagnosis Date   Anxiety    Arthritis    right thumb CMC arthritis   Asthmatic bronchitis 2017   Cancer (HCC)    -NHL R parotid   Cataract    bilateral small- removed both eyes    Colon cancer (HCC)    stage 1- at appendiceal orifice    Complication of anesthesia    has dry mouth anyway from shograns-sore throats post op   Coronary artery calcification seen on CAT scan 11/14/2019   Depression    since 1982    Diverticulosis    Fibromyalgia    GERD (gastroesophageal reflux disease)    past hx    Heart murmur    Hyperlipidemia    controlled - metabolic syndrome per pt- goes up and down    Hypertension    Lung infiltrate on CT 09/14/2015   Meningioma (HCC) 11/2009   right post parietal 16mm - stable 11/2009 MRI   Nephrolithiasis    Nephrolithiasis    Neuromuscular disorder (HCC)    fibromyalgia   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   Pure hypercholesterolemia 11/14/2019   Sjogren's disease (HCC)     Past Surgical History:  Procedure Laterality Date   ABDOMINAL HYSTERECTOMY     APPENDECTOMY  2008  ARTERY BIOPSY  02/03/2013   Procedure: MINOR BIOPSY TEMPORAL ARTERY;  Surgeon: Ana LELON Moccasin, MD;  Location: [facility];  Service: ENT;;   COLONOSCOPY     KIDNEY STONE SURGERY  1974   LEFT HEART CATH AND CORONARY ANGIOGRAPHY N/A 12/01/2019   Procedure: LEFT HEART CATH AND CORONARY ANGIOGRAPHY;  Surgeon: Burnard Debby LABOR, MD;  Location: [facility];  Service: Cardiovascular;  Laterality: N/A;   MASS EXCISION  2008   abd-benign   PAROTIDECTOMY Right 08/01/2015   Procedure: RIGHT PAROTIDECTOMY;  Surgeon:  Daniel Moccasin, MD;  Location: [facility];  Service: ENT;  Laterality: Right;   PAROTIDECTOMY Left 04/04/2021   Procedure: LEFT PAROTIDECTOMY;  Surgeon: Moccasin Daniel, MD;  [facility];  Service: ENT;  Laterality: Left;   POLYPECTOMY     TONSILLECTOMY     VAGINAL HYSTERECTOMY  1989     Current Outpatient Medications:    acetaminophen  (TYLENOL ) 500 MG tablet, Take 500 mg by mouth every 6 (six) hours as needed for moderate pain., Disp: , Rfl:    Artificial Tear Ointment (DRY EYES OP), Apply 1 drop to eye as needed (Dry eye)., Disp: , Rfl:    Calcium  Carbonate (CALCIUM  500 PO), Take 500 mg by mouth every other day., Disp: , Rfl:    Cholecalciferol (VITAMIN D3) 50 MCG (2000 UT) TABS, Take 1 tablet by mouth daily., Disp: , Rfl:    clotrimazole-betamethasone  (LOTRISONE) cream, 1 application to affected area Externally Twice a day for 14 days, Disp: , Rfl:    Cyanocobalamin  (B-12 PO), Take 1 tablet by mouth daily., Disp: , Rfl:    gabapentin  (NEURONTIN ) 100 MG capsule, Take 1 capsule (100 mg total) by mouth at bedtime., Disp: 30 capsule, Rfl: 1   Magnesium 300 MG CAPS, 1 capsule with a meal Orally Once a day, Disp: , Rfl:    metoprolol  tartrate (LOPRESSOR ) 25 MG tablet, Take 0.5 tablets (12.5 mg total) by mouth 2 (two) times daily. TAKE 1 TABLET(25 MG) BY MOUTH TWICE DAILY AS NEEDED FOR PALPITATIONS, Disp: 180 tablet, Rfl: 3   mupirocin ointment (BACTROBAN) 2 %, SMARTSIG:1 Application Topical 2-3 Times Daily, Disp: , Rfl:    Oyster Shell Calcium  500 MG TABS, 1 tablet with meals Orally Twice a day for 30 day(s), Disp: , Rfl:    POTASSIUM PO, 1 tablet Orally Once a day, Disp: , Rfl:    PREVIDENT  5000 ENAMEL PROTECT 1.1-5 % GEL, 1 Application by Other route at bedtime., Disp: , Rfl:    simvastatin  (ZOCOR ) 20 MG tablet, TAKE 1 TABLET(20 MG) BY MOUTH AT BEDTIME, Disp: 90 tablet, Rfl: 3   valsartan  (DIOVAN ) 160 MG tablet, Take 1 tablet (160 mg total) by mouth daily. (Patient  taking differently: Take 160 mg by mouth daily.), Disp: 90 tablet, Rfl: 1  Allergies  Allergen Reactions   Clindamycin Hcl     Other reaction(s): Diarrhea, rash  Other Reaction(s): Diarrhea, rash   Lisinopril     Losartan  Potassium     Other reaction(s): somnolence  Other Reaction(s): somnolence   Metoprolol  Tartrate     Other reaction(s): Diarrhea  Other Reaction(s): Diarrhea   Amoxicillin  Rash    Other reaction(s): rash   ROS neg/noncontributory except as noted HPI/below      Objective:     BP 133/76   Pulse 73   Temp (!) 97.1 F (36.2 C) (Temporal)   Resp 18   Ht 5' 9 (1.753 m)   Wt  199 lb 4 oz (90.4 kg)   SpO2 94%   BMI 29.42 kg/m  Wt Readings from Last 3 Encounters:  02/25/24 199 lb 4 oz (90.4 kg)  02/06/24 200 lb (90.7 kg)  12/09/23 197 lb (89.4 kg)    Physical Exam   Gen: WDWN NAD HEENT: NCAT, conjunctiva not injected, sclera nonicteric CARDIAC: RRR, S1S2+,2/6   murmur.  EXT:  no edema MSK: no gross abnormalities.  NEURO: A&O x3.  CN II-XII intact.  PSYCH: normal mood. Good eye contact     Assessment & Plan:  Essential hypertension  Pruritus  Adjustment disorder with depressed mood  Other orders -     Gabapentin ; Take 1 capsule (100 mg total) by mouth at bedtime.  Dispense: 30 capsule; Refill: 1  Assessment and Plan Assessment & Plan Depression   Severe depression is impacting her daily activities and motivation, with symptoms including lack of energy, desire to stay in bed, and inability to engage in usual artistic projects. She denies suicidal ideation but reports significant emotional and mental exhaustion. She is hesitant about medication due to past side effects and concerns about feeling zonked out, especially living alone. Consider starting an activating antidepressant if symptoms persist or worsen.  Hypertension   She is not taking antihypertensive medications, including valsartan  and metoprolol , due to confusion about the regimen(I  had given written instructions on AVS). The importance of managing blood pressure to prevent complications was emphasized. She is off hydralazine  with no heart palpitations, but potential blood pressure elevation without medication is a concern. Monitor blood pressure closely. Instruct on proper use of valsartan  if blood pressure increases to >140/90.  Recommend purchasing a blood pressure cuff for home monitoring. Use metoprolol  if heart pounding.   Chronic Pruritus   Chronic itching is affecting her sleep and quality of life. She is willing to try gabapentin  despite concerns about medication side effects. Prescribe gabapentin  100mg  at night to help with itching and sleep.  General Health Maintenance   She has not scheduled a colonoscopy due to emotional distress and other personal issues. Her current emotional state was acknowledged, and immediate scheduling was not pushed. Discuss colonoscopy scheduling when she is emotionally ready.    Return in about 1 week (around 03/03/2024) for HTN.  Jenkins CHRISTELLA Carrel, MD

## 2024-03-04 ENCOUNTER — Encounter: Payer: Self-pay | Admitting: Family Medicine

## 2024-03-04 ENCOUNTER — Ambulatory Visit (INDEPENDENT_AMBULATORY_CARE_PROVIDER_SITE_OTHER): Admitting: Family Medicine

## 2024-03-04 VITALS — BP 140/80 | HR 77 | Temp 97.8°F | Resp 18 | Ht 69.0 in | Wt 198.5 lb

## 2024-03-04 DIAGNOSIS — I2510 Atherosclerotic heart disease of native coronary artery without angina pectoris: Secondary | ICD-10-CM | POA: Diagnosis not present

## 2024-03-04 DIAGNOSIS — R030 Elevated blood-pressure reading, without diagnosis of hypertension: Secondary | ICD-10-CM | POA: Diagnosis not present

## 2024-03-04 NOTE — Patient Instructions (Signed)
 Get bp cuff.  Check once/week.  If consitently >150/90, let me know.

## 2024-03-04 NOTE — Progress Notes (Signed)
 Subjective:     Patient ID: [name], female    DOB: [birth year], 83 y.o.   MRN: [id number]  Chief Complaint  Patient presents with   Hypertension    1 week follow-up on htn     HPI Discussed the use of AI scribe software for clinical note transcription with the patient, who gave verbal consent to proceed.  History of Present Illness [name] is an 83 year old female with hypertension and coronary artery calcifications who presents with blood pressure management and itching.  She is not currently taking any antihypertensive medication, including valsartan , due to previous stomach upset, which has resolved since discontinuation. She reports that she has not had any episodes of heart racing recently and keeps metoprolol  available in case it is needed. Only med she is taking is simvastatin   She experiences persistent pruritus, which she attempted to manage with gabapentin  taken once at night. However, she awoke with itching and discontinued the medication due to concerns about side effects. The pruritus has somewhat decreased but persists. She has not discontinued simvastatin  to assess if it is the cause of the itching.  She continues simvastatin  for hypercholesterolemia management, as her liver produces a high amount of cholesterol, confirmed by a year-long test in [place]. She is concerned about the necessity and potential side effects of the medication..  She has a history of Sjogren's syndrome, which she believes may contribute to her symptoms, including pruritus. She also notes an impact on her white blood cells.  She typically sleeps three to four hours a night, identifying as a night person. She has a history of volunteering at a [place] [facility] and prefers a non-regimented lifestyle.    Health Maintenance Due  Topic Date Due   Colonoscopy  03/05/2023    Past Medical History:  Diagnosis Date   Anxiety    Arthritis    right thumb CMC arthritis    Asthmatic bronchitis 2017   Cancer (HCC)    -NHL R parotid   Cataract    bilateral small- removed both eyes    Colon cancer (HCC)    stage 1- at appendiceal orifice    Complication of anesthesia    has dry mouth anyway from shograns-sore throats post op   Coronary artery calcification seen on CAT scan 11/14/2019   Depression    since 1982    Diverticulosis    Fibromyalgia    GERD (gastroesophageal reflux disease)    past hx    Heart murmur    Hyperlipidemia    controlled - metabolic syndrome per pt- goes up and down    Hypertension    Lung infiltrate on CT 09/14/2015   Meningioma (HCC) 11/2009   right post parietal 16mm - stable 11/2009 MRI   Nephrolithiasis    Nephrolithiasis    Neuromuscular disorder (HCC)    fibromyalgia   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   Pure hypercholesterolemia 11/14/2019   Sjogren's disease (HCC)     Past Surgical History:  Procedure Laterality Date   ABDOMINAL HYSTERECTOMY     APPENDECTOMY  2008   ARTERY BIOPSY  02/03/2013   Procedure: MINOR BIOPSY TEMPORAL ARTERY;  Surgeon: Ana LELON Moccasin, MD;  Location: [facility];  Service: ENT;;   COLONOSCOPY     KIDNEY STONE SURGERY  1974   LEFT HEART CATH AND CORONARY ANGIOGRAPHY N/A 12/01/2019   Procedure: LEFT HEART CATH AND CORONARY ANGIOGRAPHY;  Surgeon: Burnard Debby LABOR,  MD;  Location: [facility];  Service: Cardiovascular;  Laterality: N/A;   MASS EXCISION  2008   abd-benign   PAROTIDECTOMY Right 08/01/2015   Procedure: RIGHT PAROTIDECTOMY;  Surgeon: Daniel Moccasin, MD;  Location: [facility];  Service: ENT;  Laterality: Right;   PAROTIDECTOMY Left 04/04/2021   Procedure: LEFT PAROTIDECTOMY;  Surgeon: Moccasin Daniel, MD;  Location: [facility];  Service: ENT;  Laterality: Left;   POLYPECTOMY     TONSILLECTOMY     VAGINAL HYSTERECTOMY  1989     Current Outpatient Medications:    acetaminophen  (TYLENOL ) 500 MG tablet, Take 500 mg by  mouth every 6 (six) hours as needed for moderate pain., Disp: , Rfl:    Artificial Tear Ointment (DRY EYES OP), Apply 1 drop to eye as needed (Dry eye)., Disp: , Rfl:    Calcium  Carbonate (CALCIUM  500 PO), Take 500 mg by mouth every other day., Disp: , Rfl:    Cholecalciferol (VITAMIN D3) 50 MCG (2000 UT) TABS, Take 1 tablet by mouth daily., Disp: , Rfl:    clotrimazole-betamethasone  (LOTRISONE) cream, 1 application to affected area Externally Twice a day for 14 days, Disp: , Rfl:    Cyanocobalamin  (B-12 PO), Take 1 tablet by mouth daily., Disp: , Rfl:    gabapentin  (NEURONTIN ) 100 MG capsule, Take 1 capsule (100 mg total) by mouth at bedtime., Disp: 30 capsule, Rfl: 1   Magnesium 300 MG CAPS, 1 capsule with a meal Orally Once a day, Disp: , Rfl:    metoprolol  tartrate (LOPRESSOR ) 25 MG tablet, Take 0.5 tablets (12.5 mg total) by mouth 2 (two) times daily. TAKE 1 TABLET(25 MG) BY MOUTH TWICE DAILY AS NEEDED FOR PALPITATIONS, Disp: 180 tablet, Rfl: 3   POTASSIUM PO, 1 tablet Orally Once a day, Disp: , Rfl:    simvastatin  (ZOCOR ) 20 MG tablet, TAKE 1 TABLET(20 MG) BY MOUTH AT BEDTIME, Disp: 90 tablet, Rfl: 3  Allergies  Allergen Reactions   Clindamycin Hcl     Other reaction(s): Diarrhea, rash  Other Reaction(s): Diarrhea, rash   Lisinopril     Losartan  Potassium     Other reaction(s): somnolence  Other Reaction(s): somnolence   Metoprolol  Tartrate     Other reaction(s): Diarrhea  Other Reaction(s): Diarrhea   Amoxicillin  Rash    Other reaction(s): rash   ROS neg/noncontributory except as noted HPI/below      Objective:     BP (!) 140/80 (BP Location: Left Arm, Patient Position: Sitting, Cuff Size: Large)   Pulse 77   Temp 97.8 F (36.6 C) (Temporal)   Resp 18   Ht 5' 9 (1.753 m)   Wt 198 lb 8 oz (90 kg)   SpO2 96%   BMI 29.31 kg/m  Wt Readings from Last 3 Encounters:  03/04/24 198 lb 8 oz (90 kg)  02/25/24 199 lb 4 oz (90.4 kg)  02/06/24 200 lb (90.7 kg)     Physical Exam   Gen: WDWN NAD HEENT: NCAT, conjunctiva not injected, sclera nonicteric CARDIAC: RRR, S1S2+ EXT:  no edema MSK: no gross abnormalities.  NEURO: A&O x3.  CN II-XII intact.  PSYCH: normal mood. Good eye contact     Assessment & Plan:  Coronary artery calcification  Elevated blood pressure reading  Assessment and Plan Assessment & Plan Hypertension   Blood pressure is 140/76 mmHg. She is not on antihypertensive medication, including previously prescribed valsartan , and feels better off medication with no palpitations. Regular monitoring and maintaining a log  of readings were discussed. She should report if blood pressure consistently exceeds 150/90 mmHg. Although hesitant to purchase a blood pressure cuff, she agreed to monitor at home if she obtains one. Advise purchasing a blood pressure cuff and monitor blood pressure weekly. Instruct to keep a log of blood pressure readings. Schedule follow-up in three months to reassess blood pressure management.  Hyperlipidemia   She is on simvastatin  due to coronary calcifications and high cholesterol production by the liver, but is concerned about its necessity. Simvastatin 's role in preventing progression of coronary calcifications was discussed. Offered the option to hold the medication for a couple of weeks to assess if it causes itching, but she has not done so. Continue simvastatin  as prescribed. Discuss the option to hold simvastatin  for a couple of weeks if concerned about itching.  Pruritus   Reports itching that has calmed down somewhat. She tried gabapentin  once but is apprehensive about continuing due to potential side effects. The itching may be related to her autoimmune condition, possibly Sjogren's syndrome. Discussed the option of trying gabapentin  again more than once or at a higher dose if she is comfortable, but left the decision to her discretion. She is aware of the risks associated with gabapentin  and is  cautious about its use. Discuss the option to retry gabapentin  at a higher dose if comfortable. Monitor symptoms and reassess if itching persists or worsens.  Goals of Care   She is aware of the high costs and potential side effects of medications. She values being informed about her treatment options and is cautious about medication side effects. Prefers a less regimented approach to her care and is hesitant about frequent monitoring unless necessary. Continue to engage in shared decision-making regarding treatment options and medication management.    Return in about 3 months (around 06/04/2024) for HTN, cholesterol.  Jenkins CHRISTELLA Carrel, MD

## 2024-03-17 DIAGNOSIS — Z1231 Encounter for screening mammogram for malignant neoplasm of breast: Secondary | ICD-10-CM | POA: Diagnosis not present

## 2024-03-17 LAB — HM MAMMOGRAPHY

## 2024-03-31 ENCOUNTER — Encounter (HOSPITAL_BASED_OUTPATIENT_CLINIC_OR_DEPARTMENT_OTHER): Payer: Self-pay | Admitting: Pulmonary Disease

## 2024-03-31 ENCOUNTER — Ambulatory Visit (HOSPITAL_BASED_OUTPATIENT_CLINIC_OR_DEPARTMENT_OTHER): Admitting: Pulmonary Disease

## 2024-03-31 VITALS — BP 158/83 | HR 76 | Ht 69.0 in | Wt 200.0 lb

## 2024-03-31 DIAGNOSIS — J984 Other disorders of lung: Secondary | ICD-10-CM

## 2024-03-31 NOTE — Patient Instructions (Addendum)
 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 Stable since 2019-2024 --No routine imaging indicated however if patient wishes, surveillance CT every 2 years would be appropriate given stability. --Reviewed CT A/P 10/22/23 with stable cysts  Low-normal O2 saturations --Ambulatory O2 in-office with nadir SpO2 91% --ORDER overnight oximetry

## 2024-03-31 NOTE — Progress Notes (Signed)
 Subjective:   PATIENT ID: [name] GENDER: female DOB: 17-Jun-1941, MRN: [id number]   HPI  Chief Complaint  Patient presents with   Follow-up    Reason for Visit: Follow-up diffuse cystic lung disease  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  07/03/21 Since our last visit, she reports a light dry cough in the last several weeks. Denies shortness of breath or wheezing. Attributes this to change in weather and after switching from air conditioning to heating in her home. She does not feel like this limits her activity or quality of life. Her last PET in July demonstrated hypermetabolic nodules in the left parotid which led to biopsy which was fortunately negative for malignancy.  11/07/21 On her last visit she had a mild cough. She scheduled an appointment when her cough worsened but then resolved after discontinuing her ACEI three weeks ago. No shortness of breath or wheezing.  03/16/22 Since our last visit she reports overall feeling well. Minor non-productive cough. Denies shortness of breath or wheezing.  02/28/23 Since our last visit she was briefly hospitalized 6/24-6/25 for pneumonia. Discharged with no oxygen needs. She reports improving cough. Taking cough syrup sometimes. Denies shortness of breath or wheezing.   04/17/23 Since our last visit she is having low back pain which she is attributed due to posture and unsure if this is related to her lungs. Denies shortness of breath, cough or wheezing. No longer taking cough syrup. Has returned to making art and reading as usual  03/31/24 Since our last visit she reports losing her sense of smell. Denies recent respiratory illness since last summer. Denies nasal or chest congestion. No changes with medications. No new OTC meds. Unclear if loss  of smell was sudden or gradual. Denies shortness of breath, cough or wheezing. She has had recent social stressors from family and this is affecting her blood pressure. Reports poor sleep habits for many years, sleeping 2-3 hours at a time. Chronically fatigued but worsened from social stressors. Reports new difficulty concentrating when working on projects with her grandson.  Social History: Never smoker Works at [employer] Hx PTSD related to son's suicide (age 81, hx SA from family member) Has living daughter with mental health concerns related to above  Environmental exposures: Radiation  Past Medical History:  Diagnosis Date   Anxiety    Arthritis    right thumb CMC arthritis   Asthmatic bronchitis 2017   Cancer (HCC)    -NHL R parotid   Cataract    bilateral small- removed both eyes    Colon cancer (HCC)    stage 1- at appendiceal orifice    Complication of anesthesia    has dry mouth anyway from shograns-sore throats post op   Coronary artery calcification seen on CAT scan 11/14/2019   Depression    since 1982    Diverticulosis    Fibromyalgia    GERD (gastroesophageal reflux disease)    past hx    Heart murmur    Hyperlipidemia    controlled - metabolic syndrome per pt- goes up and down    Hypertension    Lung infiltrate on CT 09/14/2015   Meningioma (HCC) 11/2009   right post parietal 16mm - stable 11/2009 MRI   Nephrolithiasis    Nephrolithiasis    Neuromuscular disorder (HCC)  fibromyalgia   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     Artificial Tear Ointment (DRY EYES OP) Apply 1 drop to eye as needed (Dry eye).     Calcium  Carbonate (CALCIUM  500 PO) Take 500 mg  by mouth every other day.     Cholecalciferol (VITAMIN D3) 50 MCG (2000 UT) TABS Take 1 tablet by mouth daily.     clotrimazole-betamethasone  (LOTRISONE) cream 1 application to affected area Externally Twice a day for 14 days     Cyanocobalamin  (B-12 PO) Take 1 tablet by mouth daily.     gabapentin  (NEURONTIN ) 100 MG capsule Take 1 capsule (100 mg total) by mouth at bedtime. 30 capsule 1   Magnesium 300 MG CAPS 1 capsule with a meal Orally Once a day     metoprolol  tartrate (LOPRESSOR ) 25 MG tablet Take 0.5 tablets (12.5 mg total) by mouth 2 (two) times daily. TAKE 1 TABLET(25 MG) BY MOUTH TWICE DAILY AS NEEDED FOR PALPITATIONS 180 tablet 3   POTASSIUM PO 1 tablet Orally Once a day     simvastatin  (ZOCOR ) 20 MG tablet TAKE 1 TABLET(20 MG) BY MOUTH AT BEDTIME 90 tablet 3   No facility-administered medications prior to visit.    Review of Systems  Constitutional:  Positive for malaise/fatigue. Negative for chills, diaphoresis, fever and weight loss.  HENT:  Negative for congestion.   Respiratory:  Negative for cough, hemoptysis, sputum production, shortness of breath and wheezing.   Cardiovascular:  Negative for chest pain, palpitations and leg swelling.     Objective:   Vitals:   03/31/24 1027  BP: (!) 158/83  Pulse: 76  SpO2: 93%  Weight: 200 lb (90.7 kg)  Height: 5' 9 (1.753 m)   SpO2: 93 %  Physical Exam: General: Well-appearing, no acute distress HENT: [place], AT Eyes: EOMI, no scleral icterus Respiratory: Clear to auscultation bilaterally.  No crackles, wheezing or rales Cardiovascular: RRR, -M/R/G, no JVD Extremities:-Edema,-tenderness Neuro: AAO x4, CNII-XII grossly intact Psych: Normal mood, normal affect  Data Reviewed:  Imaging: PET/CT 02/06/18 - Negative for hypermetabolic activity. Thin-walled cysts in mid and lower lungs bilaterally.  CT Chest 07/08/20 - Basilar predominance of thin-walled cysts of varying sizes. Some cysts with calcifications.  PET/CT 02/23/21  - Hypermetabolic nodules in left parotid. Similar low-level hypermetabolism in LLL concerning for inflammatory etiology. Stable cystic lung disease  CT Chest 03/05/22 - Stable thin walled cysts of varying sizes. Some cyts with calcifications.   CT Chest 02/11/23 - Bilateral lower lobe ground glass opacities. Unchanged thin walled cysts bilaterally. Borderline mediastinal lymph nodes enlargement  CT Chest 04/05/23 - Borderline mediastinal lymph nodes normalized. Near resolution of bilateral lower lobe opacities and minimal scarring. Unchanged thin walled cysts bilaterally  CT A/P 10/22/23 Unchanged thin walled cysts in the lower lungs bilaterally and bibasilar atelectasis. Small hiatal hernia  PFT: 11/01/20 FVC 2.23 (67%) FEV1 1.75 (71%) Ratio 78  TLC 109% DLCO 71%. FVC increased by 12% and FEV1 increased by 13% Interpretation: Reversible obstructive defect present on spirometry. Increased RV and RV/TLC consistent with air trapping. Mildly reduced gas exchange  Labs: 10/18/15 ANA 1:1280 SSA >8 SSB>8 CCP neg  Echo: 12/08/19 EF 70-75%.  Age-related DD. No WMA or valvular abnormalities.  Cardiac cath 12/01/19 - No CAD  Assessment & Plan:   Discussion: 83 year old female never smoker with hx B cell lymphoma dx in 2016, Sjogren's syndrome who presents for follow-up for diffuse cystic lung disease 2/2 to Sjogren's and reversible obstructive lung disease. Denies respiratory symptoms however worsening fatigue and concentration. In-office noted to have low-normal O2 saturations. Discussed further evaluation with ONO to rule out nocturnal hypoxemia.  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 Stable since 2019-2024 --Reviewed CT A/P 10/22/23 with  stable cysts. --No routine imaging indicated however if patient wishes, surveillance CT every 2 years would be appropriate given stability.  Low-normal O2 saturations --Ambulatory O2 in-office with nadir SpO2 91% --ORDER overnight oximetry  Possible PTSD  --Counseled on benefit of therpay/counseling --Declined any referrals at this time --Recommended seeking counseling and to discuss with PCP in the future  Health Maintenance Immunization History  Administered Date(s) Administered    sv, Bivalent, Protein Subunit Rsvpref,pf (Abrysvo) 12/13/2023   Fluzone Influenza virus vaccine,trivalent (IIV3), split virus 04/28/2014   Influenza Split 05/15/2012, 05/21/2015   Influenza Whole 05/21/2007, 05/19/2008, 06/01/2010   Influenza, High Dose Seasonal PF 05/09/2021, 05/14/2023   Influenza,inj,Quad PF,6+ Mos 05/01/2016, 05/10/2017, 05/13/2018, 05/07/2019, 05/12/2020   Influenza,inj,quad, With Preservative 06/14/2015   Influenza-Unspecified 05/24/2022   PFIZER(Purple Top)SARS-COV-2 Vaccination 10/03/2019, 10/25/2019, 06/28/2020, 11/18/2020, 05/05/2021   PNEUMOCOCCAL CONJUGATE-20 12/05/2021   Pfizer Covid-19 Vaccine Bivalent Booster 29yrs & up 05/05/2021   Pfizer(Comirnaty)Fall Seasonal Vaccine 12 years and older 05/24/2022, 05/14/2023, 11/21/2023   Pneumococcal Conjugate-13 03/01/2015   Pneumococcal Polysaccharide-23 08/20/1998, 02/22/1999   Respiratory Syncytial Virus Vaccine,Recomb Aduvanted(Arexvy) 06/28/2022   Td 04/29/2006   Tdap 12/19/2022   Zoster Recombinant(Shingrix) 06/14/2020, 12/05/2021   Zoster, Live 06/23/2007, 04/13/2020   Zoster, Unspecified 04/13/2000   Orders Placed This Encounter  Procedures   Pulse oximetry, overnight    On room air    Standing Status:   Future    Expiration Date:   03/31/2025    Scheduling Instructions:     On room air   No orders of the defined types were placed in this encounter.  Return in about 3 months (around 07/01/2024) for for 30 min  slot.  I have spent a total time of 39-minutes on the day of the appointment including chart review, data review, collecting history, coordinating care and discussing medical diagnosis and plan with the patient/family. Past medical history, allergies, medications were reviewed. Pertinent imaging, labs and tests included in this note have been reviewed and interpreted independently by me.  Demeco Ducksworth Slater Staff, MD [facility] 03/31/2024 11:19 AM

## 2024-04-01 ENCOUNTER — Telehealth: Payer: Self-pay | Admitting: *Deleted

## 2024-04-01 NOTE — Telephone Encounter (Signed)
 Copied from CRM [id number]. Topic: Clinical - Medication Question >> Apr 01, 2024 10:48 AM Ismael A wrote: Reason for CRM: received call from patient stating she was recently taken off simvastatin  (ZOCOR ) 20 MG tablet to see if that would help with the itching but states she is still experiencing it. She would like to know if Dr. Wendolyn would like her to be placed back on this medication

## 2024-04-02 NOTE — Telephone Encounter (Signed)
 Patient notified of message below.

## 2024-04-16 ENCOUNTER — Ambulatory Visit: Payer: PPO | Admitting: Hematology and Oncology

## 2024-04-16 ENCOUNTER — Other Ambulatory Visit: Payer: PPO

## 2024-04-16 ENCOUNTER — Other Ambulatory Visit: Payer: Self-pay | Admitting: Hematology and Oncology

## 2024-04-16 DIAGNOSIS — C8588 Other specified types of non-Hodgkin lymphoma, lymph nodes of multiple sites: Secondary | ICD-10-CM

## 2024-04-17 ENCOUNTER — Inpatient Hospital Stay: Payer: PPO | Admitting: Hematology and Oncology

## 2024-04-17 ENCOUNTER — Encounter: Payer: Self-pay | Admitting: Hematology and Oncology

## 2024-04-17 ENCOUNTER — Inpatient Hospital Stay: Payer: PPO | Attending: Hematology and Oncology

## 2024-04-17 VITALS — BP 158/68 | HR 83 | Temp 97.8°F | Resp 18 | Ht 69.0 in | Wt 199.6 lb

## 2024-04-17 DIAGNOSIS — Z8572 Personal history of non-Hodgkin lymphomas: Secondary | ICD-10-CM | POA: Diagnosis not present

## 2024-04-17 DIAGNOSIS — C8588 Other specified types of non-Hodgkin lymphoma, lymph nodes of multiple sites: Secondary | ICD-10-CM

## 2024-04-17 LAB — COMPREHENSIVE METABOLIC PANEL WITH GFR
ALT: 11 U/L (ref 0–44)
AST: 15 U/L (ref 15–41)
Albumin: 4.1 g/dL (ref 3.5–5.0)
Alkaline Phosphatase: 87 U/L (ref 38–126)
Anion gap: 7 (ref 5–15)
BUN: 15 mg/dL (ref 8–23)
CO2: 28 mmol/L (ref 22–32)
Calcium: 9.8 mg/dL (ref 8.9–10.3)
Chloride: 103 mmol/L (ref 98–111)
Creatinine, Ser: 0.66 mg/dL (ref 0.44–1.00)
GFR, Estimated: >60 mL/min (ref >60)
Glucose, Bld: 101 mg/dL — ABNORMAL HIGH (ref 70–99)
Potassium: 4.5 mmol/L (ref 3.5–5.1)
Sodium: 138 mmol/L (ref 135–145)
Total Bilirubin: 0.3 mg/dL (ref 0.0–1.2)
Total Protein: 7.6 g/dL (ref 6.5–8.1)

## 2024-04-17 LAB — CBC WITH DIFFERENTIAL/PLATELET
Abs Immature Granulocytes: 0.02 K/uL (ref 0.00–0.07)
Basophils Absolute: 0 K/uL (ref 0.0–0.1)
Basophils Relative: 0 %
Eosinophils Absolute: 0.3 K/uL (ref 0.0–0.5)
Eosinophils Relative: 4 %
HCT: 37.7 % (ref 36.0–46.0)
Hemoglobin: 12.6 g/dL (ref 12.0–15.0)
Immature Granulocytes: 0 %
Lymphocytes Relative: 14 %
Lymphs Abs: 1 K/uL (ref 0.7–4.0)
MCH: 29.7 pg (ref 26.0–34.0)
MCHC: 33.4 g/dL (ref 30.0–36.0)
MCV: 88.9 fL (ref 80.0–100.0)
Monocytes Absolute: 0.7 K/uL (ref 0.1–1.0)
Monocytes Relative: 10 %
Neutro Abs: 5 K/uL (ref 1.7–7.7)
Neutrophils Relative %: 72 %
Platelets: 221 K/uL (ref 150–400)
RBC: 4.24 MIL/uL (ref 3.87–5.11)
RDW: 12.6 % (ref 11.5–15.5)
WBC: 7.1 K/uL (ref 4.0–10.5)
nRBC: 0 % (ref 0.0–0.2)

## 2024-04-17 NOTE — Progress Notes (Signed)
 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    Hicks Lonn, MD  INTERVAL HISTORY: she returns for surveillance follow-up for history of lymphoma She is doing well She has no new lymphadenopathy  PHYSICAL EXAMINATION: ECOG PERFORMANCE STATUS: 0 - Asymptomatic  Vitals:   04/17/24 1051  BP: (!) 158/68  Pulse: 83  Resp: 18  Temp: 97.8 F (36.6 C)  SpO2: 94%   Filed Weights   04/17/24 1051  Weight: 199 lb 9.6 oz (90.5 kg)   GENERAL:alert, no distress and comfortable SKIN: skin color, texture, turgor are normal, no rashes or significant lesions EYES: normal, conjunctiva are pink and non-injected, sclera clear OROPHARYNX:no exudate, no erythema and lips, buccal mucosa, and tongue normal  NECK: supple, thyroid  normal size, non-tender, without nodularity LYMPH:  no palpable lymphadenopathy in the cervical, axillary or inguinal LUNGS: clear to auscultation and percussion with normal breathing effort HEART: regular rate & rhythm with soft ejection systolic murmur and no lower extremity edema ABDOMEN:abdomen soft, non-tender and normal bowel sounds Musculoskeletal:no cyanosis of digits and no clubbing  PSYCH: alert & oriented x 3 with fluent speech NEURO: no  focal motor/sensory deficits  Relevant data reviewed during this visit included CBC and CMP  Oncology History  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 Please refer to report for other findings   11/10/2015 Procedure   she underwent CT guided biopsy   11/10/2015 Pathology Results   Accession: [id number] right lung CT-guided biopsy suspicious for lymphoma   12/02/2015 - 12/23/2015 Chemotherapy   She received 4 doses of weekly rituximab .   02/10/2016 PET scan   There is no evidence for residual or recurrent hypermetabolic adenopathy   03/09/2016 Imaging   The masslike area of concern in the medial right lung base seen on the previous study is unchanged in size since March 2017. A biopsy was previously recommended after the CT scan from October 26, 2015. Please see that report for further recommendations as the finding was better characterized at that time. 2. No pulmonary emboli. 3. Multiple thin walled cysts in the mid and lower lungs is stable. 4. 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   02/06/2017 PET scan   No evidence of active lymphomatous involvement. Stable 1.5 cm mixed cystic/ solid nodule in the right lower lobe, non FDG avid. Continued attention on follow-up is suggested.   02/13/2017 Procedure   Colonoscopy - One 8 mm polyp in the cecum, removed with a cold snare. Resected and retrieved. - Two 3 to 4 mm polyps  in the transverse colon, removed with a cold biopsy forceps. Resected and retrieved. - One 5 mm polyp in the descending colon, removed with a cold snare. Resected and retrieved. - Two 3 to 5 mm polyps at the recto-sigmoid colon, removed with a cold snare. Resected and retrieved. - Mild  diverticulosis in the sigmoid colon. - Internal hemorrhoids.      02/06/2018 PET scan   Negative PET-CT. No findings for enlarged or hypermetabolic lymph nodes to suggest lymphoma.   02/24/2021 PET scan   1. Hypermetabolic nodules are nodes centered about the left parotid gland, most likely indicative of recurrent lymphoma-given clinical history. 2. No evidence of hypermetabolic adenopathy outside of the neck.  3. Similar low-level hypermetabolism corresponding to an area of vague left lower lobe increased density. 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 4. Incidental findings, including: Coronary artery atherosclerosis. Aortic Atherosclerosis (ICD10-I70.0). Tiny hiatal hernia.     04/04/2021 Surgery   PREOPERATIVE DIAGNOSES: 1. Left parotid mass   POSTOPERATIVE DIAGNOSES: 1. Left parotid mass   PROCEDURE PERFORMED: Left lateral parotidectomy with facial nerve dissection     04/04/2021 Pathology Results   A. PAROTID GLAND, LEFT, PAROTIDECTOMY:  -  Lymphoepithelial sialadenitis  -  See comment   COMMENT:   The excision specimen consists of tissue submitted as left parotid.  It is composed of nodular lymphoepithelial lesions and interspersed fibrosis.  Cytokeratin AE1/3 and p40 highlight the epithelial component within the nodular lymphoid proliferations.  The lymphocytes are composed of an admixture of B and T cells, by CD20 and CD3, respectively.  The B cells do not aberrantly express CD5, CD23, CD43 or cyclin D1.  Scattered germinal centers with polarization are highlighted by CD10, BCL6, Bcl-2 (negative) and Ki-67.  Plasma cells are polytypic (CD138, kappa and lambda by in situ hybridization).  Flow cytometry performed on the sample (see [id number]), did not identify a monoclonal B or phenotypically aberrant T-cell population.  Overall, there is no definitive evidence of a lymphoproliferative process in the examined material.

## 2024-04-17 NOTE — Assessment & Plan Note (Addendum)
 She was diagnosed with marginal zone lymphoma in 2016, treated with 4 cycles of rituximab  with complete response Careful examination of the head and neck region did not reveal any abnormal lymphadenopathy The patient is not symptomatic I plan to see her once a year

## 2024-04-27 DIAGNOSIS — G4730 Sleep apnea, unspecified: Secondary | ICD-10-CM | POA: Diagnosis not present

## 2024-04-27 DIAGNOSIS — R0902 Hypoxemia: Secondary | ICD-10-CM | POA: Diagnosis not present

## 2024-04-29 ENCOUNTER — Ambulatory Visit (INDEPENDENT_AMBULATORY_CARE_PROVIDER_SITE_OTHER): Admitting: Otolaryngology

## 2024-04-29 ENCOUNTER — Telehealth: Payer: Self-pay | Admitting: Pulmonary Disease

## 2024-04-29 ENCOUNTER — Encounter (INDEPENDENT_AMBULATORY_CARE_PROVIDER_SITE_OTHER): Payer: Self-pay | Admitting: Otolaryngology

## 2024-04-29 VITALS — BP 155/77 | HR 85

## 2024-04-29 DIAGNOSIS — Z09 Encounter for follow-up examination after completed treatment for conditions other than malignant neoplasm: Secondary | ICD-10-CM | POA: Diagnosis not present

## 2024-04-29 DIAGNOSIS — L230 Allergic contact dermatitis due to metals: Secondary | ICD-10-CM

## 2024-04-29 DIAGNOSIS — R070 Pain in throat: Secondary | ICD-10-CM

## 2024-04-29 DIAGNOSIS — M3500 Sicca syndrome, unspecified: Secondary | ICD-10-CM

## 2024-04-29 DIAGNOSIS — Z9889 Other specified postprocedural states: Secondary | ICD-10-CM | POA: Diagnosis not present

## 2024-04-29 NOTE — Telephone Encounter (Signed)
 [facility] Telephone Encounter  Overnight Oximetry Results Date: 04/28/24 SpO2 <88% 0 hour 13 min 20 sec.  [place] SpO2 78%  Qualified for oxygen  Assessment/Plan Mild Nocturnal Hypoxemia --Attempted to call patient however no answer. Left voicemail to callback for results. Please convey the message below when she calls back:  --Qualified for oxygen since >5 min however ok if patient would like to decline treatment and consider re-evaluation in the future --If patient wishes to wear oxygen nightly, please order 1L via [facility] nightly.

## 2024-04-29 NOTE — Telephone Encounter (Signed)
 Pt states she would like to wait to until she sees you at her OV to discuss

## 2024-04-29 NOTE — Progress Notes (Signed)
 ENT CONSULT:  Reason for Consult: hx of Sjogren transient throat discomfort hx of left parotidectomy  HPI: Discussed the use of AI scribe software for clinical note transcription with the patient, who gave verbal consent to proceed.  History of Present Illness [name] is an 83 year old female with hx of Sjogren's syndrome who presents for establishment of care.  She has a history of Sjogren's syndrome and has undergone multiple surgeries, including a left parotidectomy and a temporal artery procedure. When left parotid was examined by pathology, there were lymphocytes noted but flow cytometry was negative. She has had no new neck masses or lumps anywhere else. Denies fevers, chills, weight changes, rash.   About a week ago, she experienced minor throat pain that lasted for two to three days and then resolved.   She has a history of Sjogren's syndrome, diagnosed years ago in [place], [place] , and experiences many symptoms associated with this autoimmune condition. She also notes a recent increase in blood pressure, which she attributes to a family crisis involving her sister.  Additionally, she reports a minor issue with her pierced ears, which have started to ooze slightly when she changes earrings, a problem that began a year or two ago. She stopped wearing earrings.    Records Reviewed:  Op note by Dr Karis  Temporal artery biopsy on the left 2014    04/04/21 - op note left parotidectomy by Dr Karis  Past Medical History:  Diagnosis Date   Anxiety    Arthritis    right thumb [facility] arthritis   Asthmatic bronchitis 2017   Cancer (HCC)    -NHL R parotid   Cataract    bilateral small- removed both eyes    Colon cancer (HCC)    stage 1- at appendiceal orifice    Complication of anesthesia    has dry mouth anyway from shograns-sore throats post op   Coronary artery calcification seen on CAT scan 11/14/2019   Depression    since 1982    Diverticulosis     Fibromyalgia    GERD (gastroesophageal reflux disease)    past hx    Heart murmur    Hyperlipidemia    controlled - metabolic syndrome per pt- goes up and down    Hypertension    Lung infiltrate on CT 09/14/2015   Meningioma (HCC) 11/2009   right post parietal 16mm - stable 11/2009 MRI   Nephrolithiasis    Nephrolithiasis    Neuromuscular disorder (HCC)    fibromyalgia   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   Pure hypercholesterolemia 11/14/2019   Sjogren's disease (HCC)     Past Surgical History:  Procedure Laterality Date   ABDOMINAL HYSTERECTOMY     APPENDECTOMY  2008   ARTERY BIOPSY  02/03/2013   Procedure: MINOR BIOPSY TEMPORAL ARTERY;  Surgeon: Ana LELON Karis, MD;  Location: [facility];  Service: ENT;;   COLONOSCOPY     KIDNEY STONE SURGERY  1974   LEFT HEART CATH AND CORONARY ANGIOGRAPHY N/A 12/01/2019   Procedure: LEFT HEART CATH AND CORONARY ANGIOGRAPHY;  Surgeon: Burnard Debby LABOR, MD;  Location: [facility];  Service: Cardiovascular;  Laterality: N/A;   MASS EXCISION  2008   abd-benign   PAROTIDECTOMY Right 08/01/2015   Procedure: RIGHT PAROTIDECTOMY;  Surgeon: Daniel Karis, MD;  Location: [facility];  Service: ENT;  Laterality: Right;   PAROTIDECTOMY Left 04/04/2021   Procedure: LEFT PAROTIDECTOMY;  Surgeon:  Karis Clunes, MD;  Location: [facility];  Service: ENT;  Laterality: Left;   POLYPECTOMY     TONSILLECTOMY     VAGINAL HYSTERECTOMY  1989    Family History  Problem Relation Age of Onset   Colon cancer Mother 34   Colon cancer Father        unknown when onset was   Suicidality Son    Diabetes Neg Hx    Stomach cancer Neg Hx    Colon polyps Neg Hx    Esophageal cancer Neg Hx    Rectal cancer Neg Hx     Social History:  reports that she has never smoked. She has never used smokeless tobacco. She reports that she does not currently use alcohol. She reports that she does not use  drugs.  Allergies: No Known Allergies  Medications: I have reviewed the patient's current medications.  The PMH, PSH, Medications, Allergies, and SH were reviewed and updated.  ROS: Constitutional: Negative for fever, weight loss and weight gain. Cardiovascular: Negative for chest pain and dyspnea on exertion. Respiratory: Is not experiencing shortness of breath at rest. Gastrointestinal: Negative for nausea and vomiting. Neurological: Negative for headaches. Psychiatric: The patient is not nervous/anxious  Blood pressure (!) 155/77, pulse 85, SpO2 93%. There is no height or weight on file to calculate BMI.  PHYSICAL EXAM:  Exam: General: Well-developed, well-nourished Respiratory Respiratory effort: Equal inspiration and expiration without stridor Cardiovascular Peripheral Vascular: Warm extremities with equal color/perfusion Eyes: No nystagmus with equal extraocular motion bilaterally Neuro/Psych/Balance: Patient oriented to person, place, and time; Appropriate mood and affect; Gait is intact with no imbalance; Cranial nerves I-XII are intact Head and Face Inspection: Normocephalic and atraumatic without mass or lesion Palpation: Facial skeleton intact without bony stepoffs Salivary Glands: No mass or tenderness  Facial Strength: Facial motility symmetric and full bilaterally ENT Pinna: External ear intact and fully developed External canal: Canal is patent with intact skin ear lobules without crusting or erythema Tympanic Membrane: Clear and mobile External Nose: No scar or anatomic deformity Internal Nose: Septum is relatively straight on anterior rhinoscopy. Bilateral inferior turbinate hypertrophy.  Lips, Teeth, and gums: Mucosa and teeth intact and viable Oral cavity/oropharynx: No erythema or exudate, no lesions present Neck Neck and Trachea: Midline trachea without mass or lesion Well-healed left parotidectomy scar Thyroid : No mass or nodularity Lymphatics: No  lymphadenopathy   Studies Reviewed: Final pathology left parotidectomy 04/04/21 FINAL MICROSCOPIC DIAGNOSIS:   A. PAROTID GLAND, LEFT, PAROTIDECTOMY:  -  Lymphoepithelial sialadenitis  -  See comment   COMMENT:   The excision specimen consists of tissue submitted as left parotid.  It  is composed of nodular lymphoepithelial lesions and interspersed  fibrosis.  Cytokeratin AE1/3 and p40 highlight the epithelial component  within the nodular lymphoid proliferations.  The lymphocytes are  composed of an admixture of B and T cells, by CD20 and CD3,  respectively.  The B cells do not aberrantly express CD5, CD23, CD43 or  cyclin D1.  Scattered germinal centers with polarization are highlighted  by CD10, BCL6, Bcl-2 (negative) and Ki-67.  Plasma cells are polytypic  (CD138, kappa and lambda by in situ hybridization).  Flow cytometry  performed on the sample (see [id number]), did not identify a monoclonal  B or phenotypically aberrant T-cell population.  Overall, there is no  definitive evidence of a lymphoproliferative process in the examined  material.    Assessment/Plan: Encounter Diagnoses  Name Primary?   SJOGREN'S SYNDROME Yes  Throat discomfort    Allergic contact dermatitis due to metals     Assessment and Plan Assessment & Plan Hx of Left parotid gland surgery  Final pathology showed parotid gland infiltration by lymphocytes, but flow cytometry was negative, likely due to Sjogren's syndrome. No current symptoms. No new masses or lesions. 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 Please do not hesitate to contact me with any questions or concerns.   Elena Larry, MD Otolaryngology [facility] Phone: [phone] Fax: [phone]    04/29/2024, 2:39 PM

## 2024-04-29 NOTE — Progress Notes (Signed)
 Patient is having BP managed by PCP. They are keeping an eye on it.

## 2024-05-05 ENCOUNTER — Telehealth: Payer: Self-pay | Admitting: Pharmacist

## 2024-05-05 NOTE — Progress Notes (Signed)
 Pharmacy Quality Measure Review  This patient is appearing on a report for being at risk of failing the adherence measure for hypertension (ACEi/ARB) medications this calendar year.   Medication: valsartan  160mg  daily  Last fill date: 12/09/2023 for 90 day supply per adherence report  Reviewed recent refill history in Dr Annemarie database. Actual last refill date was 09/02/2023 for 90 day supply. Looks like valsartan  was stopped due to side effects 12/09/2023 - called [facility] per their records patient picked up valsartan  Rx filled 12/09/2023 on 12/14/2023.   BP Readings from Last 3 Encounters:  04/29/24 (!) 155/77  04/17/24 (!) 158/68  03/31/24 (!) 158/83   Hypertension is not well controlled. Patient has tried several medications and was seen in hypertension clinic in 2023  Past medications tried:  Amlodipine  - cause leg swelling / edema Valsartan  - GI upset and palpitations Losartan  - somnolence Hydrochlorothiazide  - worsened GERD Bystolic  - felt bad  Atenolol  - shortness of breath Hydralazine  - pruritus and sleep disturbance Metoprolol  - diarrhea   Valsartan  has been stopped. No action needed at this time.  Patient has follow up with PCP for hypertension management in October.  Possible treatment options if needed - spironolactone 12.5mg  to start then 25mg  daily. Retrial of amlodipine  at lower dose 2.5mg  daily or combo of benazepril / amlodipine  - 10/2.5mg  daily (lower risk of edema).   Madelin Ray, PharmD Clinical Pharmacist [facility]  Population Health [phone]

## 2024-06-09 ENCOUNTER — Ambulatory Visit: Admitting: Family Medicine

## 2024-06-09 ENCOUNTER — Encounter: Payer: Self-pay | Admitting: Family Medicine

## 2024-06-09 VITALS — BP 160/80 | HR 84 | Temp 98.2°F | Ht 69.0 in | Wt 199.6 lb

## 2024-06-09 DIAGNOSIS — R252 Cramp and spasm: Secondary | ICD-10-CM

## 2024-06-09 DIAGNOSIS — I10 Essential (primary) hypertension: Secondary | ICD-10-CM | POA: Diagnosis not present

## 2024-06-09 DIAGNOSIS — R251 Tremor, unspecified: Secondary | ICD-10-CM | POA: Diagnosis not present

## 2024-06-09 DIAGNOSIS — C8588 Other specified types of non-Hodgkin lymphoma, lymph nodes of multiple sites: Secondary | ICD-10-CM | POA: Diagnosis not present

## 2024-06-09 DIAGNOSIS — E782 Mixed hyperlipidemia: Secondary | ICD-10-CM

## 2024-06-09 DIAGNOSIS — L299 Pruritus, unspecified: Secondary | ICD-10-CM

## 2024-06-09 NOTE — Patient Instructions (Addendum)
 Try gabapentin  at night for itching for 1 week and let me know.   Try electrolytes in 1 water per day  When cramps-take spoon mustard.   Hydralazine -for very high blood pressure-like >180/100.    Refer to neurologist.

## 2024-06-09 NOTE — Progress Notes (Signed)
 Subjective:     Patient ID: [name], female    DOB: 11-22-1940, 83 y.o.   MRN: [id number]  Chief Complaint  Patient presents with   Hypertension    87mo follow up; got several things to discuss     Discussed the use of AI scribe software for clinical note transcription with the patient, who gave verbal consent to proceed.  History of Present Illness [name] is an 83 year old female with hypertension and Sjogren's syndrome who presents with new onset leg cramps and tremors.  She has been experiencing severe leg cramps and tremors for the past three weeks. The cramps affect her thighs and occur both day and night. They are described as severe and similar to previous calf cramps that were alleviated by potassium supplementation. However, she has been advised against taking potassium due to heart concerns. The cramps are now accompanied by a sensation of trembling in her legs, which she feels but does not visibly observe. This trembling affects her walking and causes significant concern as she relies on her legs for mobility.  She has a history of hypertension with recent blood pressure readings reported as 150/44, 140 or 142 over 70, and 138 (with some uncertainty about the exact lower numbers). She is not currently on regular antihypertensive medication but has occasionally taken metoprolol  for heart palpitations.  She mentions having stopped simvastatin  recently to see if it was related to her itching, which she describes as severe and widespread, affecting her entire body including her face. The itching is persistent, causing significant distress and sleep disruption. She has tried gabapentin  in the past but felt 'druggie' the next day and has not continued it.  She has a history of Sjogren's syndrome and suspects that her itching may be related to this autoimmune condition. She experiences itching around her underarms and neck, which sometimes leads to bleeding due  to scratching. The itching is described as a 'horror show' and significantly impacts her quality of life, contributing to sleep disturbances and daytime fatigue.  Her past medical history includes lymphoma, which is currently in remission. She has undergone extensive testing to rule out liver issues as a cause of her symptoms. She also mentions a history of calf cramps that were previously managed with potassium supplementation, which she discontinued due to potential cardiac risks.  She reports a social history involving significant family stress, including caring for her sister who has cognitive issues and dealing with the aftermath of familial trauma. This stress contributes to her overall sense of fatigue and mental exhaustion.    Health Maintenance Due  Topic Date Due   Colonoscopy  03/05/2023   COVID-19 Vaccine (9 - 2025-26 season) 04/20/2024    Past Medical History:  Diagnosis Date   Anxiety    Arthritis    right thumb CMC arthritis   Asthmatic bronchitis 2017   Cancer (HCC)    -NHL R parotid   Cataract    bilateral small- removed both eyes    Colon cancer (HCC)    stage 1- at appendiceal orifice    Complication of anesthesia    has dry mouth anyway from shograns-sore throats post op   Coronary artery calcification seen on CAT scan 11/14/2019   Depression    since 1982    Diverticulosis    Fibromyalgia    GERD (gastroesophageal reflux disease)    past hx    Heart murmur    Hyperlipidemia    controlled -  metabolic syndrome per pt- goes up and down    Hypertension    Lung infiltrate on CT 09/14/2015   Meningioma (HCC) 11/2009   right post parietal 16mm - stable 11/2009 MRI   Nephrolithiasis    Nephrolithiasis    Neuromuscular disorder (HCC)    fibromyalgia   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   Pure hypercholesterolemia 11/14/2019   Sjogren's disease     Past Surgical History:  Procedure Laterality Date   ABDOMINAL HYSTERECTOMY      APPENDECTOMY  2008   ARTERY BIOPSY  02/03/2013   Procedure: MINOR BIOPSY TEMPORAL ARTERY;  Surgeon: Ana LELON Moccasin, MD;  Location: [facility];  Service: ENT;;   COLONOSCOPY     KIDNEY STONE SURGERY  1974   LEFT HEART CATH AND CORONARY ANGIOGRAPHY N/A 12/01/2019   Procedure: LEFT HEART CATH AND CORONARY ANGIOGRAPHY;  Surgeon: Burnard Debby LABOR, MD;  Location: [facility];  Service: Cardiovascular;  Laterality: N/A;   MASS EXCISION  2008   abd-benign   PAROTIDECTOMY Right 08/01/2015   Procedure: RIGHT PAROTIDECTOMY;  Surgeon: Daniel Moccasin, MD;  Location: [facility];  Service: ENT;  Laterality: Right;   PAROTIDECTOMY Left 04/04/2021   Procedure: LEFT PAROTIDECTOMY;  Surgeon: Moccasin Daniel, MD;  Location: [facility];  Service: ENT;  Laterality: Left;   POLYPECTOMY     TONSILLECTOMY     VAGINAL HYSTERECTOMY  1989     Current Outpatient Medications:    acetaminophen  (TYLENOL ) 500 MG tablet, Take 500 mg by mouth every 6 (six) hours as needed for moderate pain., Disp: , Rfl:    Artificial Tear Ointment (DRY EYES OP), Apply 1 drop to eye as needed (Dry eye)., Disp: , Rfl:    Calcium  Carbonate (CALCIUM  500 PO), Take 500 mg by mouth every other day., Disp: , Rfl:    Cholecalciferol (VITAMIN D3) 50 MCG (2000 UT) TABS, Take 1 tablet by mouth daily., Disp: , Rfl:    clotrimazole-betamethasone  (LOTRISONE) cream, 1 application to affected area Externally Twice a day for 14 days, Disp: , Rfl:    Cyanocobalamin  (B-12 PO), Take 1 tablet by mouth daily., Disp: , Rfl:    Magnesium 300 MG CAPS, 1 capsule with a meal Orally Once a day, Disp: , Rfl:    metoprolol  tartrate (LOPRESSOR ) 25 MG tablet, Take 0.5 tablets (12.5 mg total) by mouth 2 (two) times daily. TAKE 1 TABLET(25 MG) BY MOUTH TWICE DAILY AS NEEDED FOR PALPITATIONS (Patient taking differently: Take 12.5 mg by mouth as needed. TAKE 1 TABLET(25 MG) BY MOUTH TWICE DAILY AS NEEDED FOR PALPITATIONS), Disp: 180 tablet, Rfl:  3   POTASSIUM PO, 1 tablet Orally Once a day, Disp: , Rfl:    simvastatin  (ZOCOR ) 20 MG tablet, TAKE 1 TABLET(20 MG) BY MOUTH AT BEDTIME (Patient taking differently: Take 20 mg by mouth as needed.), Disp: 90 tablet, Rfl: 3  No Known Allergies ROS neg/noncontributory except as noted HPI/below      Objective:     BP (!) 160/80 (BP Location: Left Arm, Patient Position: Sitting, Cuff Size: Large)   Pulse 84   Temp 98.2 F (36.8 C)   Ht 5' 9 (1.753 m)   Wt 199 lb 9.6 oz (90.5 kg)   SpO2 95%   BMI 29.48 kg/m  Wt Readings from Last 3 Encounters:  06/09/24 199 lb 9.6 oz (90.5 kg)  04/17/24 199 lb 9.6 oz (90.5 kg)  03/31/24 200 lb (90.7  kg)    Physical Exam VITALS: BP- 160/84 GENERAL: Well developed, well nourished, no acute distress. HEAD EYES EARS NOSE THROAT: Normocephalic, atraumatic, conjunctiva not injected, sclera nonicteric. CARDIAC: Regular rate and rhythm, S1 S2 present, no murmur, dorsalis pedis 2 plus bilaterally, good pulses, no abnormalities noted. NECK: Supple, no thyromegaly, no nodes, no carotid bruits. LUNGS: Clear to auscultation bilaterally, no wheezes. ABDOMEN: Bowel sounds present, soft, non-tender, non-distended, no hepatosplenomegaly, no masses. EXTREMITIES: No edema. MUSCULOSKELETAL: No gross abnormalities. NEUROLOGICAL: Alert and oriented x3, cranial nerves II through XII intact. PSYCHIATRIC: Normal mood, good eye contact.       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 Previous  treatments with hydroxyzine were ineffective, and gabapentin  caused excessive drowsiness. Itching is unrelated to liver dysfunction or lymphoma, which is in remission. Focus is on symptom management to improve quality of life. Restart gabapentin  at night for one week to assess tolerance and effectiveness. Consider daytime dosing if nighttime dosing is tolerated. Monitor and report weekly on gabapentin 's effectiveness for itching.  Leg cramps and lower extremity tremulousness   She experiences severe leg cramps and tremulousness in the thighs, affecting mobility and raising concern for potential neurological issues. History of calf cramps was managed with potassium, but current symptoms are more severe and involve the thighs. No neurologist is currently involved. Refer to neurology for further evaluation. Advise adding electrolytes to water once daily to potentially alleviate cramps. Monitor and report any changes in symptoms.  Essential hypertension   Intermittent elevated blood pressure readings range from 138/70 to 160/84. She is not on regular antihypertensive medication, with occasional use of metoprolol  and valsartan . Blood pressure management is complicated by stress and sleep disruption due to itching. Monitor blood pressure at home regularly. Use metoprolol  as needed for heart palpitations. Retain hydralazine  for emergency use if blood pressure exceeds 200/100.  Lymphoma in remission   Lymphoma is in remission, with no current symptoms or treatment required. Itching is not attributed to lymphoma.  HLD-off zocor  but not the culpret for itching.  Ideally, should be on for coronary calcifications  General Health Maintenance   She is up to date with flu and pneumonia vaccinations. Concerns about vaccine effectiveness due to autoimmune disease are addressed with reassurance that some protection is still provided. Avoid live vaccines due to autoimmune disease. Continue routine vaccinations as  recommended, avoiding live vaccines.     Return in about 3 months (around 09/09/2024) for chronic follow-up.  Jenkins CHRISTELLA Carrel, MD

## 2024-06-10 ENCOUNTER — Encounter (HOSPITAL_BASED_OUTPATIENT_CLINIC_OR_DEPARTMENT_OTHER): Payer: Self-pay | Admitting: Pulmonary Disease

## 2024-06-11 ENCOUNTER — Ambulatory Visit: Payer: Self-pay

## 2024-06-11 NOTE — Telephone Encounter (Signed)
 FYI Only or Action Required?: FYI only for provider.  Patient was last seen in primary care on 06/09/2024 by [name], MD.  Called Nurse Triage reporting Headache.  Symptoms began yesterday.  Interventions attempted: Nothing.  Symptoms are: completely resolved.  Triage Disposition: See PCP When Office is Open (Within 3 Days)  Patient/caregiver understands and will follow disposition?: Yes    Copied from CRM [id number]. Topic: Clinical - Red Word Triage >> Jun 11, 2024 11:09 AM [place] J wrote: [facility] that prompted transfer to Nurse Triage: Patient thought she was going to have a stroke last night . She said the feeling has lasted throughout the night.      Reason for Disposition  [1] New-onset headache AND [2] age > 50 years  Answer Assessment - Initial Assessment Questions 1. LOCATION: Where does it hurt?      Behind left ear, same area where she had a previous surgery  2. ONSET: When did the headache start? (e.g., minutes, hours, days)      Last night  3. PATTERN: Does the pain come and go, or has it been constant since it started?     Intermittent, no pain now 4. SEVERITY: How bad is the pain? and What does it keep you from doing?  (e.g., Scale 1-10; mild, moderate, or severe)     Mild throbbing  5. RECURRENT SYMPTOM: Have you ever had headaches before? If Yes, ask: When was the last time? and What happened that time?      Yes, had surgery in the past  6. CAUSE: What do you think is causing the headache?     Unsure  7. MIGRAINE: Have you been diagnosed with migraine headaches? If Yes, ask: Is this headache similar?       No 8. HEAD INJURY: Has there been any recent injury to your head?      No 9. OTHER SYMPTOMS: Do you have any other symptoms? (e.g., fever, stiff neck, eye pain, sore throat, cold symptoms)     No  Protocols used: Headache-A-AH

## 2024-06-12 ENCOUNTER — Ambulatory Visit (INDEPENDENT_AMBULATORY_CARE_PROVIDER_SITE_OTHER): Admitting: Family Medicine

## 2024-06-12 ENCOUNTER — Encounter: Payer: Self-pay | Admitting: Family Medicine

## 2024-06-12 VITALS — BP 162/78 | HR 79 | Temp 97.9°F | Ht 69.0 in | Wt 200.0 lb

## 2024-06-12 DIAGNOSIS — E559 Vitamin D deficiency, unspecified: Secondary | ICD-10-CM | POA: Diagnosis not present

## 2024-06-12 DIAGNOSIS — R519 Headache, unspecified: Secondary | ICD-10-CM | POA: Diagnosis not present

## 2024-06-12 DIAGNOSIS — E538 Deficiency of other specified B group vitamins: Secondary | ICD-10-CM | POA: Diagnosis not present

## 2024-06-12 DIAGNOSIS — R252 Cramp and spasm: Secondary | ICD-10-CM

## 2024-06-12 NOTE — Progress Notes (Signed)
 Subjective:     Patient ID: [name], female    DOB: 1940-12-27, 83 y.o.   MRN: [id number]  Chief Complaint  Patient presents with   Medical Management of Chronic Issues    The other night Pain was low grade but throbbing and worrisome due no never having headaches; worried it could be related to prev problem Temporal Arteritis Syndrome back in 2014; Very same side on L side behind ear pain started; flashes going on in eyes; but not feeling it today    Discussed the use of AI scribe software for clinical note transcription with the patient, who gave verbal consent to proceed.  History of Present Illness [name] is an 83 year old female with a history of meningioma who presents with leg cramps and a recent episode of headache with visual disturbances.  She reports experiencing leg cramps and describes her legs as feeling tight in the lower spine and thighs upon waking. The tightness lasts about an hour and resolves without medication. She questions if the cramps are related to a 'warm sock like feeling' in her legs, which is a new sensation for her.  She describes a recent episode of headache occurring two and a half days ago, with pain behind her left ear extending to the top of her neck. The headache lasted all night, accompanied by 'little tiny pain' all over her head and visual disturbances, including colorful designs and wavy lines in her vision, which lasted for a few hours. She is concerned about the possibility of a stroke or other serious event, given her history of meningioma and her sister's similar condition. She got dressed to go to the ED but decided not to.   She has had an episode in the past of wavy lines in vision.  She has a history of meningioma diagnosed years ago. She also mentions a past temporal artery biopsy in 2014, though she does not recall the results or the reason for the procedure. She recalls a past head injury where she fell and hit  her forehead, resulting in a large swelling, but was evaluated in the emergency room and was told she was okay.  She mentions taking valsartan  during the recent episode due to concerns about a potential stroke, but she is unsure if it was beneficial. She has not been regularly monitoring her blood pressure at home and has not been taking her prescribed medications, including valsartan , metoprolol , and PRN hydralazine , due to feeling worn out. She does not have a blood pressure cuff at home but considers obtaining one through a medical card benefit. She has been feeling worn out and has not wanted to deal with her medical issues.    Health Maintenance Due  Topic Date Due   Colonoscopy  03/05/2023   COVID-19 Vaccine (9 - 2025-26 season) 04/20/2024    Past Medical History:  Diagnosis Date   Anxiety    Arthritis    right thumb CMC arthritis   Asthmatic bronchitis 2017   Cancer (HCC)    -NHL R parotid   Cataract    bilateral small- removed both eyes    Colon cancer (HCC)    stage 1- at appendiceal orifice    Complication of anesthesia    has dry mouth anyway from shograns-sore throats post op   Coronary artery calcification seen on CAT scan 11/14/2019   Depression    since 1982    Diverticulosis    Fibromyalgia    GERD (  gastroesophageal reflux disease)    past hx    Heart murmur    Hyperlipidemia    controlled - metabolic syndrome per pt- goes up and down    Hypertension    Lung infiltrate on CT 09/14/2015   Meningioma (HCC) 11/2009   right post parietal 16mm - stable 11/2009 MRI   Nephrolithiasis    Nephrolithiasis    Neuromuscular disorder (HCC)    fibromyalgia   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   Pure hypercholesterolemia 11/14/2019   Sjogren's disease     Past Surgical History:  Procedure Laterality Date   ABDOMINAL HYSTERECTOMY     APPENDECTOMY  2008   ARTERY BIOPSY  02/03/2013   Procedure: MINOR BIOPSY TEMPORAL ARTERY;  Surgeon: Ana LELON Moccasin, MD;  Location: [facility];  Service: ENT;;   COLONOSCOPY     KIDNEY STONE SURGERY  1974   LEFT HEART CATH AND CORONARY ANGIOGRAPHY N/A 12/01/2019   Procedure: LEFT HEART CATH AND CORONARY ANGIOGRAPHY;  Surgeon: Burnard Debby LABOR, MD;  Location: [facility];  Service: Cardiovascular;  Laterality: N/A;   MASS EXCISION  2008   abd-benign   PAROTIDECTOMY Right 08/01/2015   Procedure: RIGHT PAROTIDECTOMY;  Surgeon: Daniel Moccasin, MD;  Location: [facility];  Service: ENT;  Laterality: Right;   PAROTIDECTOMY Left 04/04/2021   Procedure: LEFT PAROTIDECTOMY;  Surgeon: Moccasin Daniel, MD;  Location: [facility];  Service: ENT;  Laterality: Left;   POLYPECTOMY     TONSILLECTOMY     VAGINAL HYSTERECTOMY  1989     Current Outpatient Medications:    acetaminophen  (TYLENOL ) 500 MG tablet, Take 500 mg by mouth every 6 (six) hours as needed for moderate pain., Disp: , Rfl:    Artificial Tear Ointment (DRY EYES OP), Apply 1 drop to eye as needed (Dry eye)., Disp: , Rfl:    Calcium  Carbonate (CALCIUM  500 PO), Take 500 mg by mouth every other day., Disp: , Rfl:    Cholecalciferol (VITAMIN D3) 50 MCG (2000 UT) TABS, Take 1 tablet by mouth daily., Disp: , Rfl:    clotrimazole-betamethasone  (LOTRISONE) cream, 1 application to affected area Externally Twice a day for 14 days, Disp: , Rfl:    Cyanocobalamin  (B-12 PO), Take 1 tablet by mouth daily., Disp: , Rfl:    Magnesium 300 MG CAPS, 1 capsule with a meal Orally Once a day, Disp: , Rfl:    metoprolol  tartrate (LOPRESSOR ) 25 MG tablet, Take 0.5 tablets (12.5 mg total) by mouth 2 (two) times daily. TAKE 1 TABLET(25 MG) BY MOUTH TWICE DAILY AS NEEDED FOR PALPITATIONS (Patient taking differently: Take 12.5 mg by mouth as needed. TAKE 1 TABLET(25 MG) BY MOUTH TWICE DAILY AS NEEDED FOR PALPITATIONS), Disp: 180 tablet, Rfl: 3   POTASSIUM PO, 1 tablet Orally Once a day, Disp: , Rfl:    simvastatin  (ZOCOR ) 20 MG tablet, TAKE 1  TABLET(20 MG) BY MOUTH AT BEDTIME (Patient taking differently: Take 20 mg by mouth as needed.), Disp: 90 tablet, Rfl: 3  No Known Allergies ROS neg/noncontributory except as noted HPI/below      Objective:     BP (!) 162/78 (BP Location: Left Arm, Cuff Size: Large)   Pulse 79   Temp 97.9 F (36.6 C)   Ht 5' 9 (1.753 m)   Wt 200 lb (90.7 kg)   SpO2 98%   BMI 29.53 kg/m  Wt Readings from Last 3 Encounters:  06/12/24 200 lb (  90.7 kg)  06/09/24 199 lb 9.6 oz (90.5 kg)  04/17/24 199 lb 9.6 oz (90.5 kg)    Physical Exam VITALS: BP- 162/78 GENERAL: Well developed, well nourished, no acute distress. HEAD EYES EARS NOSE THROAT: Normocephalic, atraumatic, conjunctiva not injected, sclera nonicteric. CARDIAC: Regular rate and rhythm, S1 S2 present, no murmur, NECK: Supple, non-tender, no thyromegaly, no nodes, no carotid bruits. LUNGS: Clear to auscultation bilaterally, no wheezes. ABDOMEN: Bowel sounds present, soft, non-tender, non-distended, no hepatosplenomegaly, no masses. EXTREMITIES: No edema. MUSCULOSKELETAL: No gross abnormalities. No TTP scalp NEUROLOGICAL: Alert and oriented x3, cranial nerves II through XII intact. No nystagmus PSYCHIATRIC: Normal mood, good eye contact.       Assessment & Plan:  Leg cramps -     Magnesium -     Sedimentation rate -     VITAMIN D  25 Hydroxy (Vit-D Deficiency, Fractures) -     C-reactive protein  Left-sided headache -     CBC with Differential/Platelet -     COMPLETE METABOLIC PANEL WITHOUT GFR -     Magnesium -     Sedimentation rate -     C-reactive protein  Vitamin B12 deficiency -     Vitamin B12  Vitamin D  deficiency -     Vitamin B12 -     VITAMIN D  25 Hydroxy (Vit-D Deficiency, Fractures)    Assessment and Plan Assessment & Plan Headache with visual disturbances (possible ocular migraine)   She experienced a headache behind the left ear extending to the top of the neck, with visual disturbances like colorful  designs and wavy lines. The episode lasted several hours overnight but resolved by morning. Differential diagnosis includes ocular migraine, occipital neuritis, TIA, other. No recurrence since the initial episode. Order blood work to rule out other causes. Discuss the possibility of an MRI if symptoms recur-pt declined to do now. Advise going to the ER if symptoms return and are severe.  Leg cramps and lower extremity tightness   She reports intermittent tightness in the lower spine and thighs, presenting as tightness rather than cramps. Possible neuropathy or pinched nerve in the spine. Symptoms resolve spontaneously after about an hour. Order blood work to evaluate for etiology. Consider referral to a neurologist if neuropathy persists. Advise keeping a record of cramping episodes.  Hypertension   Blood pressure has been consistently elevated since July. She is not on any antihypertensive medication. Discussed the importance of managing blood pressure to prevent complications. Recommend monitoring blood pressure at home. Consider starting antihypertensive medication if blood pressure remains elevated. Discuss the use of valsartan , metoprolol , and hydralazine  if needed.   Meningioma   No current symptoms attributable to meningioma. Previous MRIs showed no progression, and monitoring was discontinued unless symptoms arise.  Gastrointestinal bleeding   She had a previous GI bleed with unknown etiology. Avoid aspirin .     Return if symptoms worsen or fail to improve.  Jenkins CHRISTELLA Carrel, MD

## 2024-06-12 NOTE — Progress Notes (Signed)
 [name]                                          MRN: [id number]   06/12/2024   The [facility] Quality Team Specialist reviewed this patient medical record for the purposes of chart review for care gap closure. The following were reviewed: chart review for care gap closure-controlling blood pressure.    [facility] Quality Team

## 2024-06-12 NOTE — Patient Instructions (Addendum)
 Tylenol  or ibuprofen if pain.   If worse, recurs, new symptoms-to ER

## 2024-06-13 LAB — CBC WITH DIFFERENTIAL/PLATELET
Absolute Lymphocytes: 1075 {cells}/uL (ref 850–3900)
Absolute Monocytes: 557 {cells}/uL (ref 200–950)
Basophils Absolute: 38 {cells}/uL (ref 0–200)
Basophils Relative: 0.6 %
Eosinophils Absolute: 192 {cells}/uL (ref 15–500)
Eosinophils Relative: 3 %
HCT: 38.5 % (ref 35.0–45.0)
Hemoglobin: 12.5 g/dL (ref 11.7–15.5)
MCH: 29.1 pg (ref 27.0–33.0)
MCHC: 32.5 g/dL (ref 32.0–36.0)
MCV: 89.7 fL (ref 80.0–100.0)
MPV: 11.1 fL (ref 7.5–12.5)
Monocytes Relative: 8.7 %
Neutro Abs: 4538 {cells}/uL (ref 1500–7800)
Neutrophils Relative %: 70.9 %
Platelets: 225 Thousand/uL (ref 140–400)
RBC: 4.29 Million/uL (ref 3.80–5.10)
RDW: 12.5 % (ref 11.0–15.0)
Total Lymphocyte: 16.8 %
WBC: 6.4 Thousand/uL (ref 3.8–10.8)

## 2024-06-13 LAB — COMPLETE METABOLIC PANEL WITHOUT GFR
AG Ratio: 1.4 (calc) (ref 1.0–2.5)
ALT: 12 U/L (ref 6–29)
AST: 16 U/L (ref 10–35)
Albumin: 4.2 g/dL (ref 3.6–5.1)
Alkaline phosphatase (APISO): 83 U/L (ref 37–153)
BUN/Creatinine Ratio: 24 (calc) — ABNORMAL HIGH (ref 6–22)
BUN: 14 mg/dL (ref 7–25)
CO2: 26 mmol/L (ref 20–32)
Calcium: 9.8 mg/dL (ref 8.6–10.4)
Chloride: 103 mmol/L (ref 98–110)
Creat: 0.58 mg/dL — ABNORMAL LOW (ref 0.60–0.95)
Globulin: 3 g/dL (ref 1.9–3.7)
Glucose, Bld: 104 mg/dL — ABNORMAL HIGH (ref 65–99)
Potassium: 4.4 mmol/L (ref 3.5–5.3)
Sodium: 139 mmol/L (ref 135–146)
Total Bilirubin: 0.4 mg/dL (ref 0.2–1.2)
Total Protein: 7.2 g/dL (ref 6.1–8.1)

## 2024-06-13 LAB — C-REACTIVE PROTEIN: CRP: 4.7 mg/L (ref <8.0)

## 2024-06-13 LAB — SEDIMENTATION RATE: Sed Rate: 50 mm/h — ABNORMAL HIGH (ref 0–30)

## 2024-06-13 LAB — VITAMIN B12: Vitamin B-12: 451 pg/mL (ref 200–1100)

## 2024-06-13 LAB — VITAMIN D 25 HYDROXY (VIT D DEFICIENCY, FRACTURES): Vit D, 25-Hydroxy: 29 ng/mL — ABNORMAL LOW (ref 30–100)

## 2024-06-13 LAB — MAGNESIUM: Magnesium: 2.4 mg/dL (ref 1.5–2.5)

## 2024-06-14 ENCOUNTER — Ambulatory Visit: Payer: Self-pay | Admitting: Family Medicine

## 2024-06-14 NOTE — Progress Notes (Signed)
 Labs ok except Vitamin D  slightly low-make sure she is still taking it over the counter Sed rate high-has been for years-not sure what has been the cause.  She does have the history of lymphoma, lung disease, and sjogren's.   Has she ever seen a rheumatologist-if not, is she willing?

## 2024-06-15 NOTE — Telephone Encounter (Signed)
 Spoke with patient about recent lab results and notes per Dr Wendolyn. Pt acknowledged understanding. Stated will start taking separate OTC Vit D supplement. Also discussed Rheumatology. Pt stated would rather not. Don't feel they can do anything for her.

## 2024-06-15 NOTE — Telephone Encounter (Signed)
-----   Message from Jenkins CHRISTELLA Carrel sent at 06/14/2024  4:11 PM EDT ----- Labs ok except Vitamin D  slightly low-make sure she is still taking it over the counter Sed rate high-has been for years-not sure what has been the cause.  She does have the history of lymphoma, lung disease, and sjogren's.   Has she ever seen a rheumatologist-if not, is she willing? ----- Message ----- From: Interface, [facility] Lab Results In Sent: 06/12/2024  11:49 PM EDT To: Jenkins Earnie Carrel, MD

## 2024-06-26 ENCOUNTER — Ambulatory Visit (HOSPITAL_BASED_OUTPATIENT_CLINIC_OR_DEPARTMENT_OTHER): Admitting: Pulmonary Disease

## 2024-06-26 ENCOUNTER — Encounter (HOSPITAL_BASED_OUTPATIENT_CLINIC_OR_DEPARTMENT_OTHER): Payer: Self-pay | Admitting: Pulmonary Disease

## 2024-06-26 VITALS — BP 142/72 | HR 79 | Temp 98.1°F | Ht 69.0 in | Wt 203.5 lb

## 2024-06-26 DIAGNOSIS — J984 Other disorders of lung: Secondary | ICD-10-CM | POA: Diagnosis not present

## 2024-06-26 DIAGNOSIS — G4734 Idiopathic sleep related nonobstructive alveolar hypoventilation: Secondary | ICD-10-CM

## 2024-06-26 DIAGNOSIS — R051 Acute cough: Secondary | ICD-10-CM | POA: Diagnosis not present

## 2024-06-26 NOTE — Patient Instructions (Signed)
 Reversible obstructive lung disease Hx cough secondary to ACEI - resolved Recurrent cough --Discussed bronchodilators risks and benefits. 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 Stable since 2019-2024 --Reviewed CT A/P 10/22/23 with stable cysts. --No routine imaging indicated however patient requesting surveillance CT every 2 years to ensure stability --ORDER CT Chest without contrast in 6 months (May 2026) --Discussed vaccinations. UTD on flu, RSV. Declined recent covid

## 2024-06-26 NOTE — Progress Notes (Signed)
 Subjective:   PATIENT ID: [name] GENDER: female DOB: Nov 06, 1940, MRN: [id number]   HPI  Chief Complaint  Patient presents with   Lung Mass    Follow up     Reason for Visit: Follow-up diffuse cystic lung disease  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  07/03/21 Since our last visit, she reports a light dry cough in the last several weeks. Denies shortness of breath or wheezing. Attributes this to change in weather and after switching from air conditioning to heating in her home. She does not feel like this limits her activity or quality of life. Her last PET in July demonstrated hypermetabolic nodules in the left parotid which led to biopsy which was fortunately negative for malignancy.  11/07/21 On her last visit she had a mild cough. She scheduled an appointment when her cough worsened but then resolved after discontinuing her ACEI three weeks ago. No shortness of breath or wheezing.  03/16/22 Since our last visit she reports overall feeling well. Minor non-productive cough. Denies shortness of breath or wheezing.  02/28/23 Since our last visit she was briefly hospitalized 6/24-6/25 for pneumonia. Discharged with no oxygen needs. She reports improving cough. Taking cough syrup sometimes. Denies shortness of breath or wheezing.   04/17/23 Since our last visit she is having low back pain which she is attributed due to posture and unsure if this is related to her lungs. Denies shortness of breath, cough or wheezing. No longer taking cough syrup. Has returned to making art and reading as usual  03/31/24 Since our last visit she reports losing her sense of smell. Denies recent respiratory illness since last summer. Denies nasal or chest congestion. No changes with medications. No new OTC meds.  Unclear if loss of smell was sudden or gradual. Denies shortness of breath, cough or wheezing. She has had recent social stressors from family and this is affecting her blood pressure. Reports poor sleep habits for many years, sleeping 2-3 hours at a time. Chronically fatigued but worsened from social stressors. Reports new difficulty concentrating when working on projects with her grandson.  06/26/24 Since our her last visit she reports she continues to have dry cough associated with dry mouth in the last few weeks. Has not tried any over the counters. Reports a prior cough syrup that helps once a week. She reports her breathing is overall stable with not wheezing. Her sense of smell remains gone.   Social History: Never smoker Works at [employer] Hx PTSD related to son's suicide (age 40, hx SA from family member) Has living daughter with mental health concerns related to above  Environmental exposures: Radiation  Past Medical History:  Diagnosis Date   Anxiety    Arthritis    right thumb [facility] arthritis   Asthmatic bronchitis 2017   Cancer (HCC)    -NHL R parotid   Cataract    bilateral small- removed both eyes    Colon cancer (HCC)    stage 1- at appendiceal orifice    Complication of anesthesia    has dry mouth anyway from shograns-sore throats post op   Coronary artery calcification seen on CAT scan 11/14/2019   Depression    since 1982    Diverticulosis    Fibromyalgia    GERD (gastroesophageal reflux disease)    past hx  Heart murmur    Hyperlipidemia    controlled - metabolic syndrome per pt- goes up and down    Hypertension    Lung infiltrate on CT 09/14/2015   Meningioma (HCC) 11/2009   right post parietal 16mm - stable 11/2009 MRI   Nephrolithiasis    Nephrolithiasis    Neuromuscular disorder (HCC)    fibromyalgia   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     Artificial Tear Ointment (DRY EYES OP) Apply 1 drop to eye as needed (Dry eye).     Calcium  Carbonate (CALCIUM  500 PO) Take 500 mg by mouth every other day.     Cholecalciferol (VITAMIN D3) 50 MCG (2000 UT) TABS Take 1 tablet by mouth daily.     Cyanocobalamin  (B-12 PO) Take 1 tablet by mouth daily.     Magnesium 300 MG CAPS 1 capsule with a meal Orally Once a day     metoprolol  tartrate (LOPRESSOR ) 25 MG tablet Take 0.5 tablets (12.5 mg total) by mouth 2 (two) times daily. TAKE 1 TABLET(25 MG) BY MOUTH TWICE DAILY AS NEEDED FOR PALPITATIONS (Patient taking differently: Take 12.5 mg by mouth as needed. TAKE 1 TABLET(25 MG) BY MOUTH TWICE DAILY AS NEEDED FOR PALPITATIONS) 180 tablet 3   POTASSIUM PO 1 tablet Orally Once a day     simvastatin  (ZOCOR ) 20 MG tablet TAKE 1 TABLET(20 MG) BY MOUTH AT BEDTIME 90 tablet 3   clotrimazole-betamethasone  (LOTRISONE) cream 1 application to affected area Externally Twice a day for 14 days     No facility-administered medications prior to visit.    Review of Systems  Constitutional:  Negative for chills, diaphoresis, fever, malaise/fatigue and weight loss.  HENT:  Negative for congestion.   Respiratory:  Positive for cough. Negative for hemoptysis, sputum production, shortness of breath and wheezing.   Cardiovascular:  Negative for chest pain, palpitations and leg swelling.     Objective:   Vitals:   06/26/24 0920  BP: (!) 142/72  Pulse: 79  Temp: 98.1 F (36.7 C)  TempSrc: Oral  SpO2: 95%  Weight: 203 lb 8 oz (92.3 kg)  Height: 5' 9 (1.753 m)   SpO2: 95 %  Physical Exam: General: Well-appearing, no acute distress HENT: [place], AT Eyes: EOMI, no scleral icterus Respiratory: Clear to auscultation bilaterally.  No crackles, wheezing or  rales Cardiovascular: RRR, -M/R/G, no JVD Extremities:-Edema,-tenderness Neuro: AAO x4, CNII-XII grossly intact Psych: Normal mood, normal affect  Data Reviewed:  Imaging: PET/CT 02/06/18 - Negative for hypermetabolic activity. Thin-walled cysts in mid and lower lungs bilaterally.  CT Chest 07/08/20 - Basilar predominance of thin-walled cysts of varying sizes. Some cysts with calcifications.  PET/CT 02/23/21 - Hypermetabolic nodules in left parotid. Similar low-level hypermetabolism in LLL concerning for inflammatory etiology. Stable cystic lung disease  CT Chest 03/05/22 - Stable thin walled cysts of varying sizes. Some cyts with calcifications.   CT Chest 02/11/23 - Bilateral lower lobe ground glass opacities. Unchanged thin walled cysts bilaterally. Borderline mediastinal lymph nodes enlargement  CT Chest 04/05/23 - Borderline mediastinal lymph nodes normalized. Near resolution of bilateral lower lobe opacities and minimal scarring. Unchanged thin walled cysts bilaterally  CT A/P 10/22/23  Unchanged thin walled cysts in the lower lungs bilaterally and bibasilar atelectasis. Small hiatal hernia  PFT: 11/01/20 FVC 2.23 (67%) FEV1 1.75 (71%) Ratio 78  TLC 109% DLCO 71%. FVC increased by 12% and FEV1 increased by 13% Interpretation: Reversible obstructive defect present on spirometry. Increased RV and RV/TLC consistent with air trapping. Mildly reduced gas exchange  Labs: 10/18/15 ANA 1:1280 SSA >8 SSB>8 CCP neg  Echo: 12/08/19 EF 70-75%. Age-related DD. No WMA or valvular abnormalities.  Cardiac cath 12/01/19 - No CAD  Assessment & Plan:   Discussion: 83 year old female never smoker with hx B cell lymphoma dx in 2016, Sjogrens syndrome who presents for follow-up for diffuse cystic lung disease 2/2 to Sjogren's and reversible obstructive lung disease who presents for follow-up. New cough, unclear if secondary to obstructive lung disease. Not currently on bronchodilators. Discussed  bronchodilators risks and benefits. Patient declined for now  Reversible obstructive lung disease Hx cough secondary to ACEI - resolved Recurrent cough/acute cough --Discussed bronchodilators risks and benefits. 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 Stable since 2019-2024 --Reviewed CT A/P 10/22/23 with stable cysts. --No routine imaging indicated however patient requesting surveillance CT every 2 years to ensure stability --Reviewed prior CT scans 2021-2025. ORDER CT Chest without contrast in 6 months (May 2026) --Discussed vaccinations. UTD on flu, RSV. Declined recent covid  Nocturnal Hypoxemia Mild. Date: 04/28/24. SpO2 <88% 0 hour 13 min 20 sec.  --Patient declined treatment  Possible PTSD  --Counseled on benefit of therpay/counseling --Declined any referrals at this time --Recommended seeking counseling and to discuss with PCP in the future  Health Maintenance Immunization History  Administered Date(s) Administered    sv, Bivalent, Protein Subunit Rsvpref,pf (Abrysvo) 12/13/2023   Fluzone Influenza virus vaccine,trivalent (IIV3), split virus 04/28/2014   INFLUENZA, HIGH DOSE SEASONAL PF 05/09/2021, 05/14/2023, 05/07/2024   Influenza Split 05/15/2012, 05/21/2015   Influenza Whole 05/21/2007, 05/19/2008, 06/01/2010   Influenza,inj,Quad PF,6+ Mos 05/01/2016, 05/10/2017, 05/13/2018, 05/07/2019, 05/12/2020   Influenza,inj,quad, With Preservative 06/14/2015   Influenza-Unspecified 05/24/2022   PFIZER(Purple Top)SARS-COV-2 Vaccination 10/03/2019, 10/25/2019, 06/28/2020, 11/18/2020, 05/05/2021   PNEUMOCOCCAL CONJUGATE-20 12/05/2021   Pfizer Covid-19 Vaccine Bivalent Booster 65yrs & up 05/05/2021   Pfizer(Comirnaty)Fall Seasonal Vaccine 12 years and older 05/24/2022, 05/14/2023,  11/21/2023   Pneumococcal Conjugate-13 03/01/2015   Pneumococcal Polysaccharide-23 08/20/1998, 02/22/1999   Respiratory Syncytial Virus Vaccine,Recomb Aduvanted(Arexvy) 06/28/2022   Td 04/29/2006   Tdap 12/19/2022   Zoster Recombinant(Shingrix) 06/14/2020, 12/05/2021   Zoster, Live 06/23/2007, 04/13/2020   Zoster, Unspecified 04/13/2000   Orders Placed This Encounter  Procedures   CT Chest Wo Contrast    May 2026. Please schedule pulmonary office visit    Standing Status:   Future    Expiration Date:   06/26/2025    Scheduling Instructions:     May 2026. Please schedule pulmonary office visit    Preferred imaging location?:   [facility]   No orders of the defined types were placed in this encounter.  Return in about 6 months (around 12/24/2024) for for 30 min slot, with Dr. Kassie, after CT scan.  I have spent a total time of 30-minutes on the day of the appointment including chart review, data review, collecting history, coordinating care and discussing medical diagnosis and plan with the patient/family. Past medical history, allergies, medications were reviewed.  Pertinent imaging, labs and tests included in this note have been reviewed and interpreted independently by me.  Dacy Enrico Slater Staff, MD [facility] 06/26/2024 9:57 AM

## 2024-07-08 ENCOUNTER — Ambulatory Visit (HOSPITAL_BASED_OUTPATIENT_CLINIC_OR_DEPARTMENT_OTHER): Admitting: Pulmonary Disease

## 2024-07-10 ENCOUNTER — Other Ambulatory Visit: Payer: Self-pay

## 2024-07-10 ENCOUNTER — Emergency Department (HOSPITAL_COMMUNITY)

## 2024-07-10 ENCOUNTER — Emergency Department (HOSPITAL_COMMUNITY)
Admission: EM | Admit: 2024-07-10 | Discharge: 2024-07-11 | Disposition: A | Discharge status: Home / Self Care | Attending: Emergency Medicine | Admitting: Emergency Medicine

## 2024-07-10 ENCOUNTER — Encounter (HOSPITAL_COMMUNITY): Payer: Self-pay | Admitting: *Deleted

## 2024-07-10 DIAGNOSIS — I2510 Atherosclerotic heart disease of native coronary artery without angina pectoris: Secondary | ICD-10-CM | POA: Diagnosis not present

## 2024-07-10 DIAGNOSIS — Z85038 Personal history of other malignant neoplasm of large intestine: Secondary | ICD-10-CM | POA: Insufficient documentation

## 2024-07-10 DIAGNOSIS — R059 Cough, unspecified: Secondary | ICD-10-CM | POA: Diagnosis not present

## 2024-07-10 DIAGNOSIS — R270 Ataxia, unspecified: Secondary | ICD-10-CM | POA: Diagnosis present

## 2024-07-10 DIAGNOSIS — I10 Essential (primary) hypertension: Secondary | ICD-10-CM | POA: Insufficient documentation

## 2024-07-10 DIAGNOSIS — H5500 Unspecified nystagmus: Secondary | ICD-10-CM | POA: Diagnosis not present

## 2024-07-10 DIAGNOSIS — M79605 Pain in left leg: Secondary | ICD-10-CM | POA: Insufficient documentation

## 2024-07-10 DIAGNOSIS — M79604 Pain in right leg: Secondary | ICD-10-CM | POA: Insufficient documentation

## 2024-07-10 DIAGNOSIS — Z79899 Other long term (current) drug therapy: Secondary | ICD-10-CM | POA: Diagnosis not present

## 2024-07-10 DIAGNOSIS — I700 Atherosclerosis of aorta: Secondary | ICD-10-CM | POA: Diagnosis not present

## 2024-07-10 DIAGNOSIS — R011 Cardiac murmur, unspecified: Secondary | ICD-10-CM | POA: Diagnosis not present

## 2024-07-10 DIAGNOSIS — R42 Dizziness and giddiness: Secondary | ICD-10-CM | POA: Diagnosis not present

## 2024-07-10 DIAGNOSIS — D320 Benign neoplasm of cerebral meninges: Secondary | ICD-10-CM | POA: Insufficient documentation

## 2024-07-10 DIAGNOSIS — I6782 Cerebral ischemia: Secondary | ICD-10-CM | POA: Insufficient documentation

## 2024-07-10 DIAGNOSIS — M542 Cervicalgia: Secondary | ICD-10-CM | POA: Insufficient documentation

## 2024-07-10 LAB — CBC WITH DIFFERENTIAL/PLATELET
Abs Immature Granulocytes: 0.02 K/uL (ref 0.00–0.07)
Basophils Absolute: 0.1 K/uL (ref 0.0–0.1)
Basophils Relative: 1 %
Eosinophils Absolute: 0.3 K/uL (ref 0.0–0.5)
Eosinophils Relative: 4 %
HCT: 37.6 % (ref 36.0–46.0)
Hemoglobin: 12.2 g/dL (ref 12.0–15.0)
Immature Granulocytes: 0 %
Lymphocytes Relative: 16 %
Lymphs Abs: 1.1 K/uL (ref 0.7–4.0)
MCH: 29.6 pg (ref 26.0–34.0)
MCHC: 32.4 g/dL (ref 30.0–36.0)
MCV: 91.3 fL (ref 80.0–100.0)
Monocytes Absolute: 0.7 K/uL (ref 0.1–1.0)
Monocytes Relative: 11 %
Neutro Abs: 4.6 K/uL (ref 1.7–7.7)
Neutrophils Relative %: 68 %
Platelets: 209 K/uL (ref 150–400)
RBC: 4.12 MIL/uL (ref 3.87–5.11)
RDW: 12.6 % (ref 11.5–15.5)
WBC: 6.7 K/uL (ref 4.0–10.5)
nRBC: 0 % (ref 0.0–0.2)

## 2024-07-10 LAB — RESP PANEL BY RT-PCR (RSV, FLU A&B, COVID)  RVPGX2
Influenza A by PCR: NEGATIVE
Influenza B by PCR: NEGATIVE
Resp Syncytial Virus by PCR: NEGATIVE
SARS Coronavirus 2 by RT PCR: NEGATIVE

## 2024-07-10 LAB — COMPREHENSIVE METABOLIC PANEL WITH GFR
ALT: 16 U/L (ref 0–44)
AST: 22 U/L (ref 15–41)
Albumin: 3.9 g/dL (ref 3.5–5.0)
Alkaline Phosphatase: 81 U/L (ref 38–126)
Anion gap: 8 (ref 5–15)
BUN: 10 mg/dL (ref 8–23)
CO2: 26 mmol/L (ref 22–32)
Calcium: 9.2 mg/dL (ref 8.9–10.3)
Chloride: 102 mmol/L (ref 98–111)
Creatinine, Ser: 0.67 mg/dL (ref 0.44–1.00)
GFR, Estimated: >60 mL/min (ref >60)
Glucose, Bld: 112 mg/dL — ABNORMAL HIGH (ref 70–99)
Potassium: 4.4 mmol/L (ref 3.5–5.1)
Sodium: 136 mmol/L (ref 135–145)
Total Bilirubin: 0.5 mg/dL (ref 0.0–1.2)
Total Protein: 7.4 g/dL (ref 6.5–8.1)

## 2024-07-10 LAB — URINALYSIS, ROUTINE W REFLEX MICROSCOPIC
Bilirubin Urine: NEGATIVE
Glucose, UA: NEGATIVE mg/dL
Hgb urine dipstick: NEGATIVE
Ketones, ur: NEGATIVE mg/dL
Leukocytes,Ua: NEGATIVE
Nitrite: NEGATIVE
Protein, ur: NEGATIVE mg/dL
Specific Gravity, Urine: 1.006 (ref 1.005–1.030)
pH: 6 (ref 5.0–8.0)

## 2024-07-10 NOTE — ED Provider Notes (Signed)
 [place] EMERGENCY DEPARTMENT AT [facility] Provider Note   CSN: [id number] Arrival date & time: 07/10/24  2104     Patient presents with: Nausea   [name] is a 83 y.o. female.  {Add pertinent medical, surgical, social history, OB history to HPI:32947} HPI     Prior to Admission medications   Medication Sig Start Date End Date Taking? Authorizing Provider  acetaminophen  (TYLENOL ) 500 MG tablet Take 500 mg by mouth every 6 (six) hours as needed for moderate pain.    [provider]  Artificial Tear Ointment (DRY EYES OP) Apply 1 drop to eye as needed (Dry eye).    [provider]  Calcium  Carbonate (CALCIUM  500 PO) Take 500 mg by mouth every other day.    [provider]  Cholecalciferol (VITAMIN D3) 50 MCG (2000 UT) TABS Take 1 tablet by mouth daily.    [provider]  Cyanocobalamin  (B-12 PO) Take 1 tablet by mouth daily.    [provider]  Magnesium 300 MG CAPS 1 capsule with a meal Orally Once a day    [provider]  metoprolol  tartrate (LOPRESSOR ) 25 MG tablet Take 0.5 tablets (12.5 mg total) by mouth 2 (two) times daily. TAKE 1 TABLET(25 MG) BY MOUTH TWICE DAILY AS NEEDED FOR PALPITATIONS Patient taking differently: Take 12.5 mg by mouth as needed. TAKE 1 TABLET(25 MG) BY MOUTH TWICE DAILY AS NEEDED FOR PALPITATIONS 02/06/24   [name], MD  POTASSIUM PO 1 tablet Orally Once a day    [provider]  simvastatin  (ZOCOR ) 20 MG tablet TAKE 1 TABLET(20 MG) BY MOUTH AT BEDTIME 05/20/23   [name], MD    Allergies: Patient has no known allergies.    Review of Systems  Updated Vital Signs BP (!) 175/85 (BP Location: Left Arm)   Pulse 78   Temp 98 F (36.7 C)   Resp 18   Ht 5' 9 (1.753 m)   Wt 92.3 kg   SpO2 98%   BMI 30.05 kg/m   Physical Exam  (all labs ordered are listed, but only abnormal results are displayed) Labs Reviewed  COMPREHENSIVE METABOLIC PANEL WITH  GFR - Abnormal; Notable for the following components:      Result Value   Glucose, Bld 112 (*)    All other components within normal limits  RESP PANEL BY RT-PCR (RSV, FLU A&B, COVID)  RVPGX2  CBC WITH DIFFERENTIAL/PLATELET  URINALYSIS, ROUTINE W REFLEX MICROSCOPIC    EKG: None  Radiology: DG Chest 2 View Result Date: 07/10/2024 EXAM: 2 VIEW(S) XRAY OF THE CHEST 07/10/2024 09:50:00 PM COMPARISON: 02/11/2023 CLINICAL HISTORY: cough FINDINGS: LUNGS AND PLEURA: Known cystic areas in the lung bases are not well visualized on this exam. No focal pulmonary opacity. No pleural effusion. HEART AND MEDIASTINUM: No acute abnormality of the cardiac and mediastinal silhouettes. VASCULATURE: Aortic Atherosclerotic calcifications. BONES AND SOFT TISSUES: Multilevel degenerative changes of thoracic spine. IMPRESSION: 1. No acute cardiopulmonary abnormality. Electronically signed by: Oneil Devonshire MD 07/10/2024 09:59 PM EST RP Workstation: [id number]    {Document cardiac monitor, telemetry assessment procedure when appropriate:32947} Procedures   Medications Ordered in the ED - No data to display    {Click here for ABCD2, HEART and other calculators REFRESH Note before signing:1}                              Medical Decision Making  ***  {  Document critical care time when appropriate  Document review of labs and clinical decision tools ie CHADS2VASC2, etc  Document your independent review of radiology images and any outside records  Document your discussion with family members, caretakers and with consultants  Document social determinants of health affecting pt's care  Document your decision making why or why not admission, treatments were needed:32947:::1}   Final diagnoses:  None    ED Discharge Orders     None

## 2024-07-10 NOTE — ED Provider Triage Note (Signed)
 Emergency Medicine Provider Triage Evaluation Note  [name] , a 83 y.o. female  was evaluated in triage.  Pt complains of nausea and off-balance since 1830 tonight.  Neck soreness and feeling pain up her legs times a few weeks.  Review of Systems  Positive: Neck pain, off-balance, nausea, lower extremity pain, cough, subjective fever Negative: Chills, vomiting, numbness, weakness, chest pain, shortness of  Physical Exam  BP (!) 175/85 (BP Location: Left Arm)   Pulse 78   Temp 98 F (36.7 C)   Resp 18   Ht 5' 9 (1.753 m)   Wt 92.3 kg   SpO2 98%   BMI 30.05 kg/m  Gen:   Awake, no distress   Resp:  Normal effort  MSK:   Moves extremities without difficulty  Other:  Horizontal nystagmus noted on extraocular movements, equal bilateral grip strength, equal bilateral lower extremity strength, no facial droop noted on exam  Medical Decision Making  Medically screening exam initiated at 9:18 PM.  Appropriate orders placed.  [name] was informed that the remainder of the evaluation will be completed by another provider, this initial triage assessment does not replace that evaluation, and the importance of remaining in the ED until their evaluation is complete.  Labs and imaging ordered   [name] Ileana LOISE DEVONNA 07/10/24 2122

## 2024-07-10 NOTE — ED Triage Notes (Signed)
 Pt c/o nausea and feeling off balanced since 1830 tonight, neck soreness and weird feeling down both of her legs for a few weeks. States she thinks she has meningitis.

## 2024-07-11 ENCOUNTER — Emergency Department (HOSPITAL_COMMUNITY)

## 2024-07-11 DIAGNOSIS — I6782 Cerebral ischemia: Secondary | ICD-10-CM | POA: Diagnosis not present

## 2024-07-11 DIAGNOSIS — D320 Benign neoplasm of cerebral meninges: Secondary | ICD-10-CM | POA: Diagnosis not present

## 2024-07-11 DIAGNOSIS — R29818 Other symptoms and signs involving the nervous system: Secondary | ICD-10-CM | POA: Diagnosis not present

## 2024-07-11 LAB — TROPONIN I (HIGH SENSITIVITY)
Troponin I (High Sensitivity): 4 ng/L (ref <18)
Troponin I (High Sensitivity): 5 ng/L (ref <18)

## 2024-07-11 MED ORDER — IOHEXOL 350 MG/ML SOLN
75.0000 mL | Freq: Once | INTRAVENOUS | Status: AC | PRN
  Administered 2024-07-11: 75 mL via INTRAVENOUS

## 2024-07-11 MED ORDER — METOPROLOL TARTRATE 25 MG PO TABS: 12.5000 mg | ORAL_TABLET | Freq: Two times a day (BID) | ORAL | Status: DC

## 2024-07-11 MED ORDER — MECLIZINE HCL 25 MG PO TABS
25.0000 mg | ORAL_TABLET | Freq: Once | ORAL | Status: AC
  Administered 2024-07-11: 25 mg via ORAL
  Filled 2024-07-11: qty 1

## 2024-07-11 NOTE — ED Provider Notes (Signed)
  Physical Exam  BP (!) 163/56   Pulse 63   Temp 98.4 F (36.9 C) (Oral)   Resp 18   Ht 5' 9 (1.753 m)   Wt 92.3 kg   SpO2 97%   BMI 30.05 kg/m   Physical Exam  Procedures  Procedures  ED Course / MDM   Clinical Course as of 07/11/24 0641  Fri Jul 10, 2024  2354 CT Head Wo Contrast [CG]  Sat Jul 11, 2024  0021 Patient with history of autoimmune disorder here with ataxia. Neuro exam otherwise normal. Head CT negative. Labs unremarkable. VSS with mild hypertension. Discussed with Dr. Randol. Will need MRI brain to r/o stroke. If negative, anticipate discharge home. Consider neuro consult for new onset ataxia. [SU]  0027 Sjogrens disease hx, autoimmune disease hx. No hx of stroke. Began to lean to left and right today. When she walks, she leans to the left. Walked into a wall. No falls. No thinners.  [CG]  0103 ABCD2 score = 4 [CG]  0116 Spoke with Dr. Vanessa who advises obtaining CTA head neck, MRI and then reevaluating, closing the loop with him once results have been obtained [CG]  0151 CT ANGIO HEAD NECK W WO CM [CG]    Clinical Course User Index [CG] Ruthell Lonni FALCON, PA-C [SU] Odell Balls, PA-C   Medical Decision Making Amount and/or Complexity of Data Reviewed Radiology: ordered. Decision-making details documented in ED Course.  Risk Prescription drug management.   Care assumed from C. Groce, PA-C.  [name] is a 83 y/o F who presented for ataxia, PMH of Sjogren's, HTN, non-hodgkin lymphoma of head, colon CA, CAD, GERD, HLD, heart murmur.  Stroke w/u begun by previous providers, thus far no pertinent findings.  Extensive w/u thus far w/ MRI pending at care assumption.  Plan at this time is to consult w/ neurology post-MRI to discuss findings, disposition pending findings.  Reviewed MRI with findings as noted:  IMPRESSION:  1. No acute intracranial abnormality.  2. Stable right occipital meningioma, measuring 16 x 14 mm on axial T2-weighted   images.  3. Mildly advanced chronic microvascular ischemic changes, similar to the prior  exam.   Consulted with Dr. Matthews with neurology who also reviewed, states that patient is stable for discharge to outpatient follow-up with neurology.  Reassessed the patient who has no concerning findings on reevaluation, is fully ambulatory with normal gait, not exhibiting any ataxia at present.  Discussed care plan, provided ambulatory referral to neurology, discussed return cautions with the patient, they verbalized understanding agreement have no further concerns at this time.  At this time find at this patient is medically stable for discharge and outpatient management.    Jillian Dorn BROCKS, PA 07/11/24 1241    Jillian Charleston, MD 07/12/24 [phone]

## 2024-07-11 NOTE — ED Notes (Signed)
 Patient Alert and oriented to baseline. Stable and ambulatory to baseline. Patient verbalized understanding of the discharge instructions.  Patient belongings were taken by the patient.

## 2024-07-11 NOTE — ED Provider Notes (Signed)
 Physical Exam  BP (!) 163/56   Pulse 63   Temp 98.4 F (36.9 C) (Oral)   Resp 18   Ht 5' 9 (1.753 m)   Wt 92.3 kg   SpO2 97%   BMI 30.05 kg/m   Physical Exam Vitals and nursing note reviewed.  Constitutional:      General: She is not in acute distress.    Appearance: She is not toxic-appearing.  HENT:     Head: Normocephalic and atraumatic.  Pulmonary:     Effort: No respiratory distress.  Skin:    Coloration: Skin is not jaundiced or pale.  Neurological:     Mental Status: She is alert and oriented to person, place, and time.     Comments: No focal neurodeficits on exam.  Patient ambulates with steady gait.  Psychiatric:        Behavior: Behavior normal.     Procedures  Procedures  ED Course / MDM   Clinical Course as of 07/11/24 0630  Fri Jul 10, 2024  2354 CT Head Wo Contrast [CG]  Sat Jul 11, 2024  0021 Patient with history of autoimmune disorder here with ataxia. Neuro exam otherwise normal. Head CT negative. Labs unremarkable. VSS with mild hypertension. Discussed with Dr. Randol. Will need MRI brain to r/o stroke. If negative, anticipate discharge home. Consider neuro consult for new onset ataxia. [SU]  0027 Sjogrens disease hx, autoimmune disease hx. No hx of stroke. Began to lean to left and right today. When she walks, she leans to the left. Walked into a wall. No falls. No thinners.  [CG]  0103 ABCD2 score = 4 [CG]  0116 Spoke with Dr. Vanessa who advises obtaining CTA head neck, MRI and then reevaluating, closing the loop with him once results have been obtained [CG]  0151 CT ANGIO HEAD NECK W WO CM [CG]    Clinical Course User Index [CG] Ruthell Lonni FALCON, PA-C [SU] Odell Balls, PA-C   Medical Decision Making Amount and/or Complexity of Data Reviewed Radiology: ordered. Decision-making details documented in ED Course.  Risk Prescription drug management.   Patient signed out to me at shift change pending MRI.  In short, 83 year old  female presents to ED complaining of ataxia, lightheadedness.  States that this evening around 6:30 PM she was sitting down to eat some soup when she all of a sudden felt as if she was listing to the left.  She reports that she attempted to stand up but was having issues walking down the hallway and was running into the walls.  She said that she is feeling as if she is listing to the left and the right.  She denies that 1 side is favored.  She denies that she fell or hit her head this evening.  She denies that she has had any chest pain but reports that during the course of her workup she has developed some chest tightness.  She denies shortness of breath.  She endorses very mild nausea but denies any vomiting.  Denies headache, blurred vision.  She does endorse neck pain which she reports began around the same time as her ataxia.  She denies history of CVA.  Denies history of similar presentation.  Reports that her ataxia has greatly improved and she feels much less unsteady on her feet.  Discussed patient case with neurology, Dr. Vanessa, recommends obtaining CTA as well as MRI of brain.  CTA brain obtained which shows no LVO.  MRI has still not been obtained  at this time.  After MRI obtained, we will close the loop with neurology.  Will sign patient out to oncoming provider Myriam RIGGERS.  Plan of management discussed.       Ruthell Lonni FALCON, PA-C 07/11/24 [id number]    Darra Fonda MATSU, MD 07/12/24 [phone]

## 2024-07-13 ENCOUNTER — Telehealth: Payer: Self-pay | Admitting: Family Medicine

## 2024-07-13 NOTE — Telephone Encounter (Signed)
 Can we please place a referral to neurology as requested by patient and approved by provider.

## 2024-07-13 NOTE — Telephone Encounter (Signed)
 Please advise on sending referral to neuro per patient request.

## 2024-07-13 NOTE — Telephone Encounter (Signed)
 Pt is requesting a referral to Neurologist, would prefer to go to [facility]. Scheduled with Kennyth on 07/14/24.

## 2024-07-13 NOTE — Telephone Encounter (Signed)
 Copied from CRM #[id number]. Topic: Appointments - Scheduling Inquiry for Clinic >> Jul 13, 2024  9:08 AM Robinson H wrote: Reason for CRM: Patient calling to schedule an ER follow up with Dr. Wendolyn advised patient that Dr. Wendolyn next available is in December, patient declined scheduling with a different provider stating she needs to see Dr. Wendolyn and it's a matter of like or death per patient.  Maggie [phone]

## 2024-07-14 ENCOUNTER — Encounter: Payer: Self-pay | Admitting: Family Medicine

## 2024-07-14 ENCOUNTER — Ambulatory Visit: Admitting: Family Medicine

## 2024-07-14 ENCOUNTER — Telehealth: Payer: Self-pay

## 2024-07-14 VITALS — BP 158/66 | HR 73 | Temp 98.8°F | Resp 14 | Ht 69.0 in | Wt 198.4 lb

## 2024-07-14 DIAGNOSIS — R251 Tremor, unspecified: Secondary | ICD-10-CM | POA: Diagnosis not present

## 2024-07-14 DIAGNOSIS — F419 Anxiety disorder, unspecified: Secondary | ICD-10-CM | POA: Diagnosis not present

## 2024-07-14 DIAGNOSIS — I700 Atherosclerosis of aorta: Secondary | ICD-10-CM | POA: Insufficient documentation

## 2024-07-14 DIAGNOSIS — F4310 Post-traumatic stress disorder, unspecified: Secondary | ICD-10-CM | POA: Diagnosis not present

## 2024-07-14 DIAGNOSIS — E2839 Other primary ovarian failure: Secondary | ICD-10-CM | POA: Insufficient documentation

## 2024-07-14 DIAGNOSIS — R519 Headache, unspecified: Secondary | ICD-10-CM

## 2024-07-14 DIAGNOSIS — G9332 Myalgic encephalomyelitis/chronic fatigue syndrome: Secondary | ICD-10-CM | POA: Insufficient documentation

## 2024-07-14 DIAGNOSIS — I10 Essential (primary) hypertension: Secondary | ICD-10-CM

## 2024-07-14 DIAGNOSIS — F331 Major depressive disorder, recurrent, moderate: Secondary | ICD-10-CM | POA: Insufficient documentation

## 2024-07-14 DIAGNOSIS — Z6831 Body mass index (BMI) 31.0-31.9, adult: Secondary | ICD-10-CM | POA: Insufficient documentation

## 2024-07-14 LAB — TSH: TSH: 2.03 u[IU]/mL (ref 0.35–5.50)

## 2024-07-14 LAB — CK: Total CK: 76 U/L (ref 17–177)

## 2024-07-14 LAB — VITAMIN B12: Vitamin B-12: 305 pg/mL (ref 211–911)

## 2024-07-14 LAB — FOLATE: Folate: 18.1 ng/mL (ref >5.9)

## 2024-07-14 LAB — HEMOGLOBIN A1C: Hgb A1c MFr Bld: 5.9 % (ref 4.6–6.5)

## 2024-07-14 NOTE — Progress Notes (Signed)
 [name] is a 83 y.o. female who presents today for an office visit.  Assessment/Plan:  Leg Tremulousness  History is somewhat difficult as patient is very tangential however it sounds like patient is having neuropathic type symptoms in her lower extremities.  She did have some episodes of cramping a few weeks ago as well.  We reviewed her recent visits with her PCP as well as emergency room visit and workup there including CTA head and neck as well as brain MRI.  Patient is very concerned about her symptoms and is worried about progression to the point where she cannot walk.  We have reassured patient that her workup thus far was negative.  We will check labs today for neuropathy.  Also check a CK given her recent muscular cramps and history of autoimmune disorder.  She has already been referred to neurology for her recent issues-will make sure that she has a follow-up with them already scheduled however if her above labs are negative would be reasonable for us  to check lower back imaging to look for any other any signs of compressive neuropathy.  Would consider rule out of PAD if above workup is negative.  Headache Patient also expresses extensive concern about left-sided headache.  This has been persistent in the record for at least the last 10 years or so however patient has never been treated for this.  Based on history this does seem to be consistent with migraines.  She can discuss this with neurology or her PCP however given that this has been stable for many years and her recent MRI was negative-do not think we need to pursue any further workup for this at this point.  Essential hypertension Elevated today though patient is anxious.  She will monitor at home and follow-up with her PCP if persistently elevated  PTSD / Anxiety  Patient with quite a bit of anxiety related to her above symptoms.  She also does have PTSD related to her sons suicide when he was 75.  I did discuss  potential treatment options for this however she declined for today.  She can follow-up with her PCP if she changes her mind about this.  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  She can follow-up with neurology for this however do not think that this is causing any of her current symptoms.     Subjective:  HPI:  See assessment / plan for status of chronic conditions.  Patient here for ED follow-up.  Went to ED 4 days ago with feelings of off balance.  Medical history is notable for Sjogren's, for myalgia, hypertension, non-Hodgkin lymphoma, colon cancer, CAD, GERD, dyslipidemia, and heart murmur.  In the ED had MRI which was negative for acute finding though did show a stable right occipital meningioma and stable chronic micro vascular changes.  Neurology advised outpatient follow-up.  Discussed the use of AI scribe software for clinical note transcription with the patient, who gave verbal consent to proceed.  History of Present Illness [name] is an 83 year old female with a history of hypertension who presents with off balance and leg pain.  Four days ago, she experienced a sensation of being 'weird' on both sides of her head, fearing it was the onset of a stroke. This was accompanied by a sensation of being off balance, similar to being on a sailboat, and a brief wave of nausea. An MRI performed in the emergency department showed a stable right occipital meningioma  and stable chronic musculovascular changes. These symptoms have persisted since October, causing significant distress due to the fear of a stroke or other serious condition.  In October, she began experiencing 'tremendous, strong, weird pain' in both legs and along the base of her spine, making sitting very uncomfortable. She also experienced thigh cramps and occasional sharp pains like 'somebody stuck a pin' in her leg. She describes a low-grade aching across her lower spine, especially in  the mornings, persisting throughout the day. No numbness, tingling, or pins and needles sensations in her legs.  She has a history of a stable right occipital meningioma, noted on a recent MRI to be unchanged. She reports a low-grade throbbing sensation inside her head, persistent and keeping her awake at night. She is concerned about the meningioma due to her sister's history of a large meningioma that required surgery.  Her medical history includes Sjogren's syndrome, affecting her dental health, leading to a front tooth dropping out. She has a history of fibromyalgia, hypertension, non-Hodgkin lymphoma, colon cancer, CD, GERD, dyslipidemia, and a heart murmur. She lives alone and is concerned about her ability to manage her health issues independently. She has a history of significant stress, including the loss of her son and estrangement from her daughter, which she attributes to the development of her Sjogren's syndrome.         Objective:  Physical Exam: BP (!) 158/66   Pulse 73   Temp 98.8 F (37.1 C) (Temporal)   Resp 14   Ht 5' 9 (1.753 m)   Wt 198 lb 6.4 oz (90 kg)   SpO2 96%   BMI 29.30 kg/m   Gen: No acute distress, resting comfortably Neuro: Grossly normal, moves all extremities Psych: Tangential with rapid speech but Normal affect and thought content  Time Spent: 55 minutes of total time was spent on the date of the encounter performing the following actions: chart review prior to seeing the patient including recent visits with PCP and visited the emergency room, obtaining history, performing a medically necessary exam, counseling on the treatment plan, placing orders, and documenting in our EHR.        Worth HERO. Kennyth, MD 07/14/2024 2:58 PM

## 2024-07-14 NOTE — Telephone Encounter (Signed)
 Transition Care Management Follow-up Telephone Call Date of discharge and from where: 07/10/24; [facility] How have you been since you were released from the hospital? Same Any questions or concerns? No  Items Reviewed: Did the pt receive and understand the discharge instructions provided? Yes  Medications obtained and verified? No Other? Pt concerned w/ symptoms and thinks she had a stroke, pt not happy with treatment received at ED.  Any new allergies since your discharge? No  Dietary orders reviewed? No Do you have support at home? No   Home Care and Equipment/Supplies: Were home health services ordered? not applicable If so, what is the name of the agency?   Has the agency set up a time to come to the patient's home? not applicable Were any new equipment or medical supplies ordered?  No What is the name of the medical supply agency?  Were you able to get the supplies/equipment? not applicable Do you have any questions related to the use of the equipment or supplies? No  Functional Questionnaire: (I = Independent and D = Dependent) ADLs: I  Bathing/Dressing- I  Meal Prep- I  Eating- I  Maintaining continence- I  Transferring/Ambulation- I  Managing Meds- I  Follow up appointments reviewed:  PCP Hospital f/u appt confirmed? Yes  Scheduled to see Dr Worth Kitty on 07/14/24 @ 2 pm. Specialist Hospital f/u appt confirmed? No  Referral to [facility] placed through ED Staff; pt refused appt Are transportation arrangements needed? No  If their condition worsens, is the pt aware to call PCP or go to the Emergency Dept.? Yes Was the patient provided with contact information for the PCP's office or ED? Yes Was to pt encouraged to call back with questions or concerns? Yes

## 2024-07-14 NOTE — Patient Instructions (Addendum)
 It was very nice to see you today!  We will check blood work and make sure you have a follow-up with neurology.  Let us  know if you have any change in symptoms.  Return if symptoms worsen or fail to improve.   Take care, Dr Kennyth  PLEASE NOTE:  If you had any lab tests, please let us  know if you have not heard back within a few days. You may see your results on mychart before we have a chance to review them but we will give you a call once they are reviewed by us .   If we ordered any referrals today, please let us  know if you have not heard from their office within the next week.   If you had any urgent prescriptions sent in today, please check with the pharmacy within an hour of our visit to make sure the prescription was transmitted appropriately.   Please try these tips to maintain a healthy lifestyle:  Eat at least 3 REAL meals and 1-2 snacks per day.  Aim for no more than 5 hours between eating.  If you eat breakfast, please do so within one hour of getting up.   Each meal should contain half fruits/vegetables, one quarter protein, and one quarter carbs (no bigger than a computer mouse)  Cut down on sweet beverages. This includes juice, soda, and sweet tea.   Drink at least 1 glass of water with each meal and aim for at least 8 glasses per day  Exercise at least 150 minutes every week.

## 2024-07-17 LAB — HOMOCYSTEINE: Homocysteine: 10.8 umol/L (ref <=13.4)

## 2024-07-17 LAB — METHYLMALONIC ACID, SERUM: Methylmalonic Acid, Quant: 123 nmol/L (ref 85–423)

## 2024-07-20 ENCOUNTER — Encounter: Payer: Self-pay | Admitting: Neurology

## 2024-07-20 ENCOUNTER — Ambulatory Visit: Payer: Self-pay | Admitting: Family Medicine

## 2024-07-20 ENCOUNTER — Telehealth: Payer: Self-pay

## 2024-07-20 NOTE — Telephone Encounter (Signed)
 Noted patient message.    Copied from CRM #[id number]. Topic: Clinical - Lab/Test Results >> Jul 20, 2024  3:01 PM [name] wrote: Reason for CRM: Pt was made aware of the recent lab results from Dr.Parker and stated that she already has appts scheduled with PCP and neuro.

## 2024-07-20 NOTE — Progress Notes (Signed)
 Her labs are all NORMAL.  Recommend she follow-up with neurology and PCP soon per our last office visit.

## 2024-08-06 ENCOUNTER — Encounter: Payer: Self-pay | Admitting: Pharmacist

## 2024-08-06 NOTE — Progress Notes (Signed)
 Pharmacy Quality Measure Review  This patient is appearing on a report for being at risk of failing the adherence measure for cholesterol (statin) medications this calendar year.   Medication: simvastatin   Last fill date: 02/29/2024 for 90 day supply  Called [facility] - per pharmacist patient filled simvastatin  for 90 day supply on 05/19/2024. Rx was picked up and verified it was processed on her HTA plan with cost of $0. Patient should pass for 2025.   Other fills for simvastatin  in 2025: 09/02/2023 for 90 days, 12/03/2023 for 90 days, 02/29/2024 for 90 days and 05/19/2024 for 90 days.   Insurance report was not up to date. No action needed at this time.   Madelin Ray, PharmD Clinical Pharmacist [facility]  Population Health [phone]

## 2024-08-27 ENCOUNTER — Other Ambulatory Visit: Payer: Self-pay

## 2024-08-27 MED ORDER — SIMVASTATIN 20 MG PO TABS: 20.0000 mg | ORAL_TABLET | Freq: Every evening | ORAL | 3 refills | Status: AC

## 2024-09-02 ENCOUNTER — Ambulatory Visit: Admitting: Neurology

## 2024-09-02 ENCOUNTER — Encounter: Payer: Self-pay | Admitting: Neurology

## 2024-09-02 VITALS — BP 152/81 | HR 91 | Ht 69.0 in | Wt 198.0 lb

## 2024-09-02 DIAGNOSIS — M545 Low back pain, unspecified: Secondary | ICD-10-CM

## 2024-09-02 DIAGNOSIS — G8929 Other chronic pain: Secondary | ICD-10-CM

## 2024-09-02 DIAGNOSIS — R292 Abnormal reflex: Secondary | ICD-10-CM

## 2024-09-02 NOTE — Progress Notes (Signed)
 [facility] Clinic Note - Initial Visit   Date: 09/02/2024   [name] MRN: [id number] DOB: [birth year]   Dear Dr. Kennyth:  Thank you for your kind referral of [name] for consultation of bilateral leg pain. Although her history is well known to you, please allow us  to reiterate it for the purpose of our medical record. The patient was accompanied to the clinic by self.    [name] is a 84 y.o. right-handed female with hyperlipidemia, hypertension, Sjogren's disease, anxiety, and fibromyalgia presenting for evaluation of bilateral leg pain.   IMPRESSION/PLAN: Assessment & Plan Low back pain with lower extremity paresthesia, intermittent since October.  Her exam shows brisk patella reflexes, which may indicate lumbar canal stenosis.  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 - Continue to monitor  Meningioma, stable.  Unrelated to her lightheaded spell or bilateral leg symptoms.  Reassurance provided.   Further recommendations pending results.   ------------------------------------------------------------- History of present illness: Discussed the use of AI scribe software for clinical note transcription with the patient, who gave verbal consent to proceed.  History of Present Illness In October, she experienced a sudden, strong, tight feeling in both thighs and her lower back upon waking. This sensation was new and concerning, especially as she lives alone and is worried about her legs and spine. The tightness was intense but eventually subsided, allowing her to walk. Since then, the tightness has been intermittent, particularly affecting her lower spine and making it difficult to get comfortable, especially when sitting. She describes the sensation as soreness rather than pain, with occasional numbness and tingling, but no weakness. The  symptoms have persisted and worsened over time.  On November 22nd, she experienced a sudden episode of lightheadedness while sitting at home after dinner. She felt as if she was swaying and about to fall, despite being stationary. This sensation was alarming, and she decided against driving to the hospital, instead calling friends to take her to the emergency room. At the hospital, she underwent MRI brain, CTA head and neck which did not show acute pathology to explain symptoms.  She has stable right occipital meningioma.  She has not experienced further spell since that night.  She has a history of Sjogren's syndrome, which she believes has contributed to her dental issues and possibly other symptoms. She also has high blood pressure, which she monitors regularly, noting systolic readings as high as 180 mmHg. She denies smoking and reports only occasional social alcohol use in the past. 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   Out-side paper records, electronic medical record, and images have been reviewed where available and summarized as:  MRI brain wo contrast 07/11/2024: 1. No acute intracranial abnormality. 2. Stable right occipital meningioma, measuring 16 x 14 mm on axial T2-weighted images. 3. Mildly advanced chronic microvascular ischemic changes, similar to the prior exam.  CTA head and neck 07/11/2024: 1. No large vessel occlusion or  hemodynamically significant stenosis.   CT head wo contrast 07/10/2024: 1. No acute intracranial abnormality. 2. Stable right posterior parietal/occipital meningioma.   Lab Results  Component Value Date   HGBA1C 5.9 07/14/2024   Lab Results  Component Value Date   VITAMINB12 305 07/14/2024   Lab Results  Component Value Date   TSH 2.03 07/14/2024   Lab Results  Component Value  Date   ESRSEDRATE 10 (H) 06/12/2024    Past Medical History:  Diagnosis Date    Anxiety    Arthritis    right thumb [facility] arthritis   Asthmatic bronchitis 2017   Cancer (HCC)    -NHL R parotid   Cataract    bilateral small- removed both eyes    Colon cancer (HCC)    stage 1- at appendiceal orifice    Complication of anesthesia    has dry mouth anyway from shograns-sore throats post op   Coronary artery calcification seen on CAT scan 11/14/2019   Depression    since 1982    Diverticulosis    Fibromyalgia    GERD (gastroesophageal reflux disease)    past hx    Heart murmur    Hyperlipidemia    controlled - metabolic syndrome per pt- goes up and down    Hypertension    Lung infiltrate on CT 09/14/2015   Meningioma (HCC) 11/2009   right post parietal 16mm - stable 11/2009 MRI   Nephrolithiasis    Nephrolithiasis    Neuromuscular disorder (HCC)    fibromyalgia   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   Pure hypercholesterolemia 11/14/2019   Sjogren's disease     Past Surgical History:  Procedure Laterality Date   ABDOMINAL HYSTERECTOMY     APPENDECTOMY  2008   ARTERY BIOPSY  02/03/2013   Procedure: MINOR BIOPSY TEMPORAL ARTERY;  Surgeon: Ana LELON Moccasin, MD;  Location: [facility];  Service: ENT;;   COLONOSCOPY     KIDNEY STONE SURGERY  1974   LEFT HEART CATH AND CORONARY ANGIOGRAPHY N/A 12/01/2019   Procedure: LEFT HEART CATH AND CORONARY ANGIOGRAPHY;  Surgeon: Burnard Debby LABOR, MD;  Location: [facility];  Service: Cardiovascular;  Laterality: N/A;   MASS EXCISION  2008   abd-benign   PAROTIDECTOMY Right 08/01/2015   Procedure: RIGHT PAROTIDECTOMY;  Surgeon: Daniel Moccasin, MD;  Location: [facility];  Service: ENT;  Laterality: Right;   PAROTIDECTOMY Left 04/04/2021   Procedure: LEFT PAROTIDECTOMY;  Surgeon: Moccasin Daniel, MD;  Location:  SURGERY CENTER;  Service: ENT;  Laterality: Left;   POLYPECTOMY     TONSILLECTOMY     VAGINAL HYSTERECTOMY  1989     Medications:  Outpatient Encounter  Medications as of 09/02/2024  Medication Sig   acetaminophen  (TYLENOL ) 500 MG tablet Take 500 mg by mouth every 6 (six) hours as needed for moderate pain.   Artificial Tear Ointment (DRY EYES OP) Apply 1 drop to eye as needed (Dry eye).   Calcium  Carbonate (CALCIUM  500 PO) Take 500 mg by mouth every other day.   hydrALAZINE  (APRESOLINE ) 50 MG tablet Take 50 mg by mouth as needed.   metoprolol  tartrate (LOPRESSOR ) 25 MG tablet Take 0.5 tablets (12.5 mg total) by mouth 2 (two) times daily. TAKE 1 TABLET(25 MG) BY MOUTH TWICE DAILY AS NEEDED FOR PALPITATIONS   valsartan  (DIOVAN ) 160 MG tablet Take 160 mg by mouth daily.   Cholecalciferol (VITAMIN D3) 50 MCG (2000 UT) TABS Take 1 tablet by mouth daily. (Patient not taking: Reported on 09/02/2024)   Cyanocobalamin  (B-12 PO) Take 1 tablet by mouth daily. (Patient not taking: Reported on 09/02/2024)   Magnesium 300 MG CAPS 1 capsule with a meal Orally Once a day (Patient not taking: Reported on 09/02/2024)   POTASSIUM PO 1 tablet Orally Once a day (Patient not taking: Reported on 09/02/2024)   simvastatin  (ZOCOR ) 20  MG tablet Take 1 tablet (20 mg total) by mouth at bedtime. (Patient not taking: Reported on 09/02/2024)   No facility-administered encounter medications on file as of 09/02/2024.    Allergies: Allergies[1]  Family History: Family History  Problem Relation Age of Onset   Colon cancer Mother 31   Colon cancer Father        unknown when onset was   Suicidality Son    Diabetes Neg Hx    Stomach cancer Neg Hx    Colon polyps Neg Hx    Esophageal cancer Neg Hx    Rectal cancer Neg Hx     Social History: Social History[2] Social History   Social History Narrative   Pt lives at home alone.   Caffeine Use: 2 mugs every other day.       [facility]:   Originally from [place], [place]. She has also lived in [place], [place], & [place] . Internationally she has been to [place], [place], [place], [place], & [place]. Previously has worked in  airline pilot. No chemical or fume exposures. No pets currently. No bird, mold, or hot tub exposure. 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       Are you right handed or left handed? Right Handed    Are you currently employed ? No    What is your current occupation?     Do you live at home alone? Yes   Who lives with you?    What type of home do you live in: 1 story or 2 story? Lives in a one story home        Vital Signs:  BP (!) 152/81   Pulse 91   Ht 5' 9 (1.753 m)   Wt 198 lb (89.8 kg)   SpO2 94%   BMI 29.24 kg/m   Neurological Exam: MENTAL STATUS including orientation to time, place, person, recent and remote memory, attention span and concentration, language, and fund of knowledge is normal.  Speech is not dysarthric.  CRANIAL NERVES: II:  No visual field defects.     III-IV-VI: Pupils equal round and reactive to light.  Normal conjugate, extra-ocular eye movements in all directions of gaze.  No nystagmus.  No ptosis.   V:  Normal facial sensation.    VII:  Normal facial symmetry and movements.   VIII:  Normal hearing and vestibular function.   IX-X:  Normal palatal movement.   XI:  Normal shoulder shrug and head rotation.   XII:  Normal tongue strength and range of motion, no deviation or fasciculation.  MOTOR:  No atrophy, fasciculations or abnormal movements.  No pronator drift.   Upper Extremity:  Right  Left  Deltoid  5/5   5/5   Biceps  5/5   5/5   Triceps  5/5   5/5   Infraspinatus 5/5  5/5  Medial pectoralis 5/5  5/5  Wrist extensors  5/5   5/5   Wrist flexors  5/5   5/5   Finger extensors  5/5   5/5   Finger flexors  5/5   5/5   Dorsal interossei  5/5   5/5   Abductor pollicis  5/5   5/5   Tone (Ashworth scale)  0  0   Lower Extremity:  Right  Left  Hip flexors  5/5   5/5   Hip extensors  5/5   5/5   Adductor 5/5  5/5  Abductor 5/5  5/5  Knee flexors  5/5   5/5   Knee extensors  5/5   5/5   Dorsiflexors   5/5   5/5   Plantarflexors  5/5   5/5   Toe extensors  5/5   5/5   Toe flexors  5/5   5/5   Tone (Ashworth scale)  0  0   MSRs:                                           Right        Left brachioradialis 2+  2+  biceps 2+  2+  triceps 2+  2+  patellar 3+  3+  ankle jerk 2+  2+  Hoffman no  no  plantar response down  down  Crossed adductors bilaterally  SENSORY: Trace vibration at the ankles, absent at the feet.  Pin prick and temperature is reduced below the mid-calf bilaterally.   Romberg's sign absent.   COORDINATION/GAIT: Normal finger-to- nose-finger.  Intact rapid alternating movements bilaterally.  Gait narrow based and stable. Mild unsteadiness with tandem gait, but able to perform.  Stressed gait intact.   Thank you for allowing me to participate in patient's care.  If I can answer any additional questions, I would be pleased to do so.    Sincerely,    Armelia Penton K. Djibril Glogowski, DO     [1] No Known Allergies [2]  Social History Tobacco Use   Smoking status: Never    Passive exposure: Never   Smokeless tobacco: Never   Tobacco comments:    Passive exposure through her mother.  Vaping Use   Vaping status: Never Used  Substance Use Topics   Alcohol use: Not Currently    Comment: Rarely   Drug use: No   "

## 2024-09-02 NOTE — Patient Instructions (Signed)
We will order MRI lumbar spine

## 2024-09-09 ENCOUNTER — Encounter: Payer: Self-pay | Admitting: Family Medicine

## 2024-09-09 ENCOUNTER — Ambulatory Visit: Admitting: Family Medicine

## 2024-09-09 VITALS — BP 158/80 | HR 70 | Temp 97.0°F | Ht 69.0 in | Wt 198.4 lb

## 2024-09-09 DIAGNOSIS — I10 Essential (primary) hypertension: Secondary | ICD-10-CM | POA: Diagnosis not present

## 2024-09-09 DIAGNOSIS — I2510 Atherosclerotic heart disease of native coronary artery without angina pectoris: Secondary | ICD-10-CM

## 2024-09-09 DIAGNOSIS — R0789 Other chest pain: Secondary | ICD-10-CM

## 2024-09-09 DIAGNOSIS — R42 Dizziness and giddiness: Secondary | ICD-10-CM | POA: Diagnosis not present

## 2024-09-09 DIAGNOSIS — E782 Mixed hyperlipidemia: Secondary | ICD-10-CM

## 2024-09-09 MED ORDER — VALSARTAN 160 MG PO TABS: 160.0000 mg | ORAL_TABLET | Freq: Every day | ORAL | 1 refills | Status: AC

## 2024-09-09 NOTE — Progress Notes (Signed)
 "  Subjective:     Patient ID: [name], female    DOB: 20-Feb-1941, 84 y.o.   MRN: [id number]  Chief Complaint  Patient presents with   Hypertension   Hyperlipidemia    Pt is here for chronic issues    Discussed the use of AI scribe software for clinical note transcription with the patient, who gave verbal consent to proceed.  Pt states no ptsd. History of Present Illness [name] is an 84 year old female with hypertension and meningioma who presents with dizziness and imbalance.  She has been experiencing worsening dizziness and imbalance since October, with a sensation of her spine clenching daily. On November 22nd, she had a severe episode of dizziness at home, feeling as if she was on a ship, which lasted two to three minutes. She felt off balance, bumping into a wall but did not fall. Friends took her to the emergency room, where she underwent multiple tests. She disputes nausea as a primary complaint noted in her records. Upset in her chart.  She has a history of meningioma, previously stable according to an MRI four years ago. She is concerned about its growth due to her symptoms. She experienced 'squiggly lines' in her vision and strange sensations in her head, which she was eval in ER in Nov-no growth.  Did see neuro.  Her blood pressure has been consistently high, with readings up to 179/70. She takes hydralazine  as needed for high readings but has not been consistent with valsartan  due to running out of the medication. She has a history of not regularly checking her blood pressure at home and has not been taking her prescribed medications consistently, including valsartan  and metoprolol .  She has experienced dental issues recently, including a root canal and a front tooth falling out, which she attributes to her autoimmune disease affecting her teeth. She has spent significant amounts on dental care over the years.  She reports a history of high blood  pressure only in recent years and is concerned about its impact on her health, including potential connections to her dizziness and imbalance. She also mentions a history of pain behind her left ear and sensations on the left side of her head, which she does not describe as headaches.  She has been experiencing stress related to a financial dispute with her bank, which she believes is affecting her blood pressure. She also mentions a past traumatic event, her son's suicide, but denies having post-traumatic stress disorder, despite it being noted in her medical records.  After AI done, pt mentioned intermitt chest pain/tightness.  No pattern.  H/o CAC    Health Maintenance Due  Topic Date Due   Colonoscopy  03/05/2023    Past Medical History:  Diagnosis Date   Anxiety    Arthritis    right thumb [facility] arthritis   Asthmatic bronchitis 2017   Cancer (HCC)    -NHL R parotid   Cataract    bilateral small- removed both eyes    Colon cancer (HCC)    stage 1- at appendiceal orifice    Complication of anesthesia    has dry mouth anyway from shograns-sore throats post op   Coronary artery calcification seen on CAT scan 11/14/2019   Depression    since 1982    Diverticulosis    Fibromyalgia    GERD (gastroesophageal reflux disease)    past hx    Heart murmur    Hyperlipidemia    controlled -  metabolic syndrome per pt- goes up and down    Hypertension    Lung infiltrate on CT 09/14/2015   Meningioma (HCC) 11/2009   right post parietal 16mm - stable 11/2009 MRI   Nephrolithiasis    Nephrolithiasis    Neuromuscular disorder (HCC)    fibromyalgia   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   Pure hypercholesterolemia 11/14/2019   Sjogren's disease     Past Surgical History:  Procedure Laterality Date   ABDOMINAL HYSTERECTOMY     APPENDECTOMY  2008   ARTERY BIOPSY  02/03/2013   Procedure: MINOR BIOPSY TEMPORAL ARTERY;  Surgeon: Ana LELON Moccasin, MD;  Location: [facility];  Service: ENT;;   COLONOSCOPY     KIDNEY STONE SURGERY  1974   LEFT HEART CATH AND CORONARY ANGIOGRAPHY N/A 12/01/2019   Procedure: LEFT HEART CATH AND CORONARY ANGIOGRAPHY;  Surgeon: Burnard Debby LABOR, MD;  Location: [facility];  Service: Cardiovascular;  Laterality: N/A;   MASS EXCISION  2008   abd-benign   PAROTIDECTOMY Right 08/01/2015   Procedure: RIGHT PAROTIDECTOMY;  Surgeon: Daniel Moccasin, MD;  Location: [facility];  Service: ENT;  Laterality: Right;   PAROTIDECTOMY Left 04/04/2021   Procedure: LEFT PAROTIDECTOMY;  Surgeon: Moccasin Daniel, MD;  Location: [facility];  Service: ENT;  Laterality: Left;   POLYPECTOMY     TONSILLECTOMY     VAGINAL HYSTERECTOMY  1989    Current Medications[1]  Allergies[2] ROS neg/noncontributory except as noted HPI/below      Objective:     BP (!) 158/80   Pulse 70   Temp (!) 97 F (36.1 C) (Temporal)   Ht 5' 9 (1.753 m)   Wt 198 lb 6 oz (90 kg)   SpO2 98%   BMI 29.29 kg/m  Wt Readings from Last 3 Encounters:  09/09/24 198 lb 6 oz (90 kg)  09/02/24 198 lb (89.8 kg)  07/14/24 198 lb 6.4 oz (90 kg)    Physical Exam VITALS: BP- 144/ GENERAL: Well developed well nourished no acute distress HEAD EYES EARS NOSE THROAT: Normocephalic atraumatic, conjunctiva not injected, sclera nonicteric CARDIAC: Regular rate and rhythm, S1 S2 present , no murmur, dorsalis pedis 2 plus bilaterally NECK: Supple, no thyromegaly, no nodes, no carotid bruits LUNGS: Clear to auscultation bilaterally, no wheezes ABDOMEN: Bowel sounds present, soft, non tender non distended, no hepatosplenomegaly, no masses EXTREMITIES: No edema MUSCULOSKELETAL: No gross abnormalities NEUROLOGICAL: Alert and oriented x3, cranial nerves II through XII intact PSYCHIATRIC: Normal mood, good eye contact   Reviewed ER records, neuro notes  I personally spent a total of 45 minutes in the care of the patient today including preparing  to see the patient, getting/reviewing separately obtained history, performing a medically appropriate exam/evaluation, counseling and educating, documenting clinical information in the EHR, independently interpreting results, and communicating results.     Assessment & Plan:  Essential hypertension  Other chest pain -     Ambulatory referral to Cardiology  Coronary artery calcification  Mixed hyperlipidemia  Dizziness  Other orders -     Valsartan ; Take 1 tablet (160 mg total) by mouth daily.  Dispense: 30 tablet; Refill: 1    Assessment and Plan Assessment & Plan Essential hypertension   Hypertension is marked by recent elevated blood pressure readings of 179/70 and 167/75, attributed to non-adherence to valsartan  due to a misunderstanding of the medication regimen. Stress and anger from medical record inaccuracies and financial issues  may contribute to the elevated blood pressure.  Prescribe valsartan  160mg  for daily use and instruct her to take hydralazine  as needed for blood pressure over 180/100. Advise daily blood pressure monitoring and bring the blood pressure cuff to the next appointment for calibration. A follow-up appointment is scheduled in two weeks to reassess blood pressure management.  Chest pain   Intermittent chest pain may be related to elevated blood pressure or cardiac issues. She has not had a recent cardiology evaluation due to dissatisfaction with a previous cardiologist. An elevated calcium  score was noted in past evaluations. Refer to cardiology for evaluation of chest pain and potential cardiac issues. Advise close monitoring of blood pressure and management of hypertension to reduce cardiac risk.  Hyperlipidemia   Hyperlipidemia is currently managed with simvastatin , which she has resumed after a brief discontinuation due to concerns about side effects. No new issues are reported with simvastatin  use. Continue simvastatin  as prescribed for hyperlipidemia  management.  Dizziness/off balance-w/u neg in ER. Differential diagnoses for recent neurological symptoms include TIA or complex migraine, other.  May never have answer as to what happened.  Keep bp controlled.  F/u neuro.      Return in about 2 weeks (around 09/23/2024) for HTN.  Jenkins CHRISTELLA Carrel, MD     [1]  Current Outpatient Medications:    acetaminophen  (TYLENOL ) 500 MG tablet, Take 500 mg by mouth every 6 (six) hours as needed for moderate pain., Disp: , Rfl:    Artificial Tear Ointment (DRY EYES OP), Apply 1 drop to eye as needed (Dry eye)., Disp: , Rfl:    Calcium  Carbonate (CALCIUM  500 PO), Take 500 mg by mouth every other day., Disp: , Rfl:    Cholecalciferol (VITAMIN D3) 50 MCG (2000 UT) TABS, Take 1 tablet by mouth daily., Disp: , Rfl:    Cyanocobalamin  (B-12 PO), Take 1 tablet by mouth daily., Disp: , Rfl:    hydrALAZINE  (APRESOLINE ) 50 MG tablet, Take 50 mg by mouth as needed., Disp: , Rfl:    Magnesium 300 MG CAPS, 1 capsule with a meal Orally Once a day, Disp: , Rfl:    metoprolol  tartrate (LOPRESSOR ) 25 MG tablet, Take 12.5 mg by mouth 2 (two) times daily as needed., Disp: , Rfl:    POTASSIUM PO, 1 tablet Orally Once a day, Disp: , Rfl:    simvastatin  (ZOCOR ) 20 MG tablet, Take 1 tablet (20 mg total) by mouth at bedtime., Disp: 90 tablet, Rfl: 3   valsartan  (DIOVAN ) 160 MG tablet, Take 1 tablet (160 mg total) by mouth daily., Disp: 30 tablet, Rfl: 1 [2] No Known Allergies  "

## 2024-09-09 NOTE — Patient Instructions (Addendum)
 Take the valsartan  daily Check blood pressures daily and record  Bring bp cuff with you to appt in 2-3 wks  Metoprolol  if heart racing Hydralazine  if needed for high.

## 2024-09-22 ENCOUNTER — Other Ambulatory Visit

## 2024-09-24 NOTE — Progress Notes (Unsigned)
"   °  °  Cardiology Office Note Date:  09/24/2024  ID:  [name], DOB 1941/08/09, MRN [id number] PCP:  Jillian Jenkins Jansky, MD  Cardiologist:  Joelle VEAR Ren Donley, MD  No chief complaint on file.    Problems CP CAD LHC 4/21: 10% mid LAD TTE 4/21: 70-75% TTE 10/23: 72%, GIIDD Palpitations EM 10/23: 30 episodes of SVT with longest 11.3 sec; none trigerred; started on MT as needed Intracranial meningioma HTN up to 170s/90s (intolerance to multiple medications) M: HE 50 as needed, MT 12.5 mg BID as needed, SN20 nightly, VN160  Visits  02/26: LP, PET    Discussed the use of AI scribe software for clinical note transcription with the patient, who gave verbal consent to proceed.  History of Present Illness    ROS: Please see the history of present illness. All other systems are reviewed and negative.   PHYSICAL EXAM: VS:  There were no vitals taken for this visit. , BMI There is no height or weight on file to calculate BMI. GEN: Well nourished, well developed, in no acute distress HEENT: normal Neck: no JVD, carotid bruits, or masses Cardiac: ***RRR; no murmurs, rubs, or gallops,no edema  Respiratory:  CTAB bilaterally, normal work of breathing GI: soft, nontender, nondistended, + BS Extremities: No LE edema Skin: warm and dry, no rash Neuro:  Strength and sensation are intact  EKG: ***  Recent Labs: Reviewed  Studies: Reviewed Assessment & Plan      Signed, Joelle VEAR Ren Donley, MD  09/24/2024 1:28 PM    [facility] "

## 2024-09-28 ENCOUNTER — Ambulatory Visit: Admitting: Family Medicine

## 2024-09-30 ENCOUNTER — Other Ambulatory Visit

## 2024-10-08 ENCOUNTER — Ambulatory Visit

## 2024-10-08 DIAGNOSIS — I10 Essential (primary) hypertension: Secondary | ICD-10-CM

## 2024-10-08 DIAGNOSIS — I2510 Atherosclerotic heart disease of native coronary artery without angina pectoris: Secondary | ICD-10-CM

## 2024-10-08 DIAGNOSIS — E785 Hyperlipidemia, unspecified: Secondary | ICD-10-CM

## 2024-12-10 ENCOUNTER — Ambulatory Visit

## 2025-04-16 ENCOUNTER — Ambulatory Visit: Admitting: Hematology and Oncology

## 2025-04-16 ENCOUNTER — Other Ambulatory Visit
